# Patient Record
Sex: Female | Born: 1935 | Race: White | Hispanic: No | Smoking: Never smoker
Health system: Southern US, Community
[De-identification: ages and names within clinical notes are randomized; demographics above are authoritative.]

## PROBLEM LIST (undated history)

## (undated) DIAGNOSIS — F39 Unspecified mood [affective] disorder: Secondary | ICD-10-CM

## (undated) DIAGNOSIS — I2510 Atherosclerotic heart disease of native coronary artery without angina pectoris: Secondary | ICD-10-CM

## (undated) DIAGNOSIS — Z9989 Dependence on other enabling machines and devices: Secondary | ICD-10-CM

## (undated) DIAGNOSIS — I214 Non-ST elevation (NSTEMI) myocardial infarction: Secondary | ICD-10-CM

## (undated) DIAGNOSIS — I2699 Other pulmonary embolism without acute cor pulmonale: Secondary | ICD-10-CM

## (undated) DIAGNOSIS — I10 Essential (primary) hypertension: Secondary | ICD-10-CM

## (undated) DIAGNOSIS — I209 Angina pectoris, unspecified: Secondary | ICD-10-CM

## (undated) DIAGNOSIS — G4733 Obstructive sleep apnea (adult) (pediatric): Secondary | ICD-10-CM

## (undated) DIAGNOSIS — R011 Cardiac murmur, unspecified: Secondary | ICD-10-CM

## (undated) HISTORY — PX: BREAST CYST ASPIRATION: SHX578

## (undated) HISTORY — PX: TONSILLECTOMY: SUR1361

## (undated) HISTORY — PX: COLONOSCOPY: SHX174

## (undated) HISTORY — PX: EYE SURGERY: SHX253

## (undated) HISTORY — DX: Unspecified mood (affective) disorder: F39

## (undated) HISTORY — PX: CARDIAC CATHETERIZATION: SHX172

## (undated) HISTORY — PX: CATARACT EXTRACTION, BILATERAL: SHX1313

## (undated) HISTORY — DX: Atherosclerotic heart disease of native coronary artery without angina pectoris: I25.10

## (undated) HISTORY — PX: INGUINAL HERNIA REPAIR: SUR1180

---

## 2004-10-23 ENCOUNTER — Ambulatory Visit: Payer: Self-pay | Admitting: Ophthalmology

## 2004-10-28 ENCOUNTER — Ambulatory Visit: Payer: Self-pay | Admitting: Ophthalmology

## 2004-12-02 ENCOUNTER — Ambulatory Visit: Payer: Self-pay | Admitting: Ophthalmology

## 2005-09-01 ENCOUNTER — Ambulatory Visit: Payer: Self-pay | Admitting: Internal Medicine

## 2005-10-15 ENCOUNTER — Ambulatory Visit: Payer: Self-pay | Admitting: Ophthalmology

## 2005-10-20 ENCOUNTER — Ambulatory Visit: Payer: Self-pay | Admitting: Ophthalmology

## 2007-01-04 ENCOUNTER — Ambulatory Visit: Payer: Self-pay | Admitting: Internal Medicine

## 2007-05-23 ENCOUNTER — Other Ambulatory Visit: Payer: Self-pay

## 2007-05-23 ENCOUNTER — Inpatient Hospital Stay: Payer: Self-pay | Admitting: Internal Medicine

## 2007-10-03 ENCOUNTER — Ambulatory Visit: Payer: Self-pay | Admitting: Internal Medicine

## 2008-05-24 ENCOUNTER — Emergency Department: Payer: Self-pay | Admitting: Emergency Medicine

## 2008-11-12 ENCOUNTER — Ambulatory Visit: Payer: Self-pay | Admitting: Internal Medicine

## 2009-05-27 ENCOUNTER — Ambulatory Visit: Payer: Self-pay | Admitting: Unknown Physician Specialty

## 2010-05-27 ENCOUNTER — Ambulatory Visit: Payer: Self-pay | Admitting: Internal Medicine

## 2010-09-29 ENCOUNTER — Ambulatory Visit: Payer: Self-pay | Admitting: Internal Medicine

## 2012-01-13 ENCOUNTER — Ambulatory Visit: Payer: Self-pay | Admitting: Internal Medicine

## 2015-02-08 ENCOUNTER — Emergency Department
Admission: EM | Admit: 2015-02-08 | Discharge: 2015-02-08 | Disposition: A | Discharge status: Home / Self Care | Payer: PPO | Attending: Emergency Medicine | Admitting: Emergency Medicine

## 2015-02-08 ENCOUNTER — Emergency Department: Payer: PPO

## 2015-02-08 ENCOUNTER — Encounter: Payer: Self-pay | Admitting: Emergency Medicine

## 2015-02-08 DIAGNOSIS — Y9389 Activity, other specified: Secondary | ICD-10-CM | POA: Insufficient documentation

## 2015-02-08 DIAGNOSIS — S0101XA Laceration without foreign body of scalp, initial encounter: Secondary | ICD-10-CM | POA: Diagnosis not present

## 2015-02-08 DIAGNOSIS — I10 Essential (primary) hypertension: Secondary | ICD-10-CM | POA: Insufficient documentation

## 2015-02-08 DIAGNOSIS — Y998 Other external cause status: Secondary | ICD-10-CM | POA: Diagnosis not present

## 2015-02-08 DIAGNOSIS — Y9289 Other specified places as the place of occurrence of the external cause: Secondary | ICD-10-CM | POA: Diagnosis not present

## 2015-02-08 DIAGNOSIS — S0003XA Contusion of scalp, initial encounter: Secondary | ICD-10-CM

## 2015-02-08 DIAGNOSIS — R791 Abnormal coagulation profile: Secondary | ICD-10-CM | POA: Insufficient documentation

## 2015-02-08 DIAGNOSIS — W01190A Fall on same level from slipping, tripping and stumbling with subsequent striking against furniture, initial encounter: Secondary | ICD-10-CM | POA: Insufficient documentation

## 2015-02-08 HISTORY — DX: Essential (primary) hypertension: I10

## 2015-02-08 HISTORY — DX: Other pulmonary embolism without acute cor pulmonale: I26.99

## 2015-02-08 LAB — CBC WITH DIFFERENTIAL/PLATELET
BASOS ABS: 0.1 10*3/uL (ref 0–0.1)
BASOS PCT: 1 %
EOS ABS: 0.1 10*3/uL (ref 0–0.7)
Eosinophils Relative: 1 %
HEMATOCRIT: 35 % (ref 35.0–47.0)
Hemoglobin: 11.1 g/dL — ABNORMAL LOW (ref 12.0–16.0)
LYMPHS ABS: 1.2 10*3/uL (ref 1.0–3.6)
LYMPHS PCT: 15 %
MCH: 25.4 pg — ABNORMAL LOW (ref 26.0–34.0)
MCHC: 31.7 g/dL — ABNORMAL LOW (ref 32.0–36.0)
MCV: 79.9 fL — AB (ref 80.0–100.0)
MONO ABS: 0.6 10*3/uL (ref 0.2–0.9)
MONOS PCT: 7 %
NEUTROS ABS: 6.2 10*3/uL (ref 1.4–6.5)
NEUTROS PCT: 76 %
Platelets: 215 10*3/uL (ref 150–440)
RBC: 4.38 MIL/uL (ref 3.80–5.20)
RDW: 15.8 % — AB (ref 11.5–14.5)
WBC: 8.2 10*3/uL (ref 3.6–11.0)

## 2015-02-08 LAB — BASIC METABOLIC PANEL
ANION GAP: 7 (ref 5–15)
BUN: 20 mg/dL (ref 6–20)
CHLORIDE: 105 mmol/L (ref 101–111)
CO2: 27 mmol/L (ref 22–32)
Calcium: 9 mg/dL (ref 8.9–10.3)
Creatinine, Ser: 0.86 mg/dL (ref 0.44–1.00)
GFR calc Af Amer: >60 mL/min (ref >60)
GFR calc non Af Amer: 59 mL/min — ABNORMAL LOW (ref >60)
GLUCOSE: 141 mg/dL — AB (ref 65–99)
Potassium: 4.1 mmol/L (ref 3.5–5.1)
SODIUM: 139 mmol/L (ref 135–145)

## 2015-02-08 LAB — PROTIME-INR
INR: 4.45
PROTHROMBIN TIME: 40 s — AB (ref 11.4–15.0)

## 2015-02-08 LAB — HEMOGLOBIN AND HEMATOCRIT, BLOOD
HEMATOCRIT: 31 % — AB (ref 35.0–47.0)
Hemoglobin: 9.9 g/dL — ABNORMAL LOW (ref 12.0–16.0)

## 2015-02-08 LAB — TROPONIN I: Troponin I: <0.03 ng/mL (ref <0.031)

## 2015-02-08 MED ORDER — FENTANYL CITRATE (PF) 100 MCG/2ML IJ SOLN: 50.0000 ug | Freq: Once | INTRAMUSCULAR | Status: DC

## 2015-02-08 MED ORDER — FENTANYL CITRATE (PF) 100 MCG/2ML IJ SOLN
INTRAMUSCULAR | Status: AC
  Filled 2015-02-08: qty 2

## 2015-02-08 MED ORDER — SODIUM CHLORIDE 0.9 % IV SOLN
INTRAVENOUS | Status: DC
  Administered 2015-02-08: 13:00:00 via INTRAVENOUS

## 2015-02-08 NOTE — ED Notes (Signed)
D/c instructions reviewed w/ pt - pt denies any further questions or concerns at present.   

## 2015-02-08 NOTE — ED Provider Notes (Addendum)
[facility] Emergency Department Provider Note   ____________________________________________  Time seen: On arrival 8 AM I have reviewed the triage vital signs and the triage nursing note.  HISTORY  Chief Complaint Head Laceration   Historian Patient and her son  HPI Alison Russo is a 81 y.o. female who lives alone and fell this morning around 3:30 in the morning striking her head on the cabinet, this was a fall from standing. She had lost her balance. It has been bleeding since the incident. She was worried about an ambulance bill and so did not call EMS, and she did not wake up or signs that she did not call him until this morning. She has been somewhat dizzy. Otherwise she's been well recently. She does take Coumadin for history of blood clots in the lung. She didn't not into her neck, chest, abdomen, or extremities.Bleeding is moderate.    Past Medical History  Diagnosis Date  . Hypertension   . History of blood clots   . PE (pulmonary embolism)     There are no active problems to display for this patient.   History reviewed. No pertinent past surgical history.  No current outpatient prescriptions on file.  Allergies Review of patient's allergies indicates no known allergies.  No family history on file.  Social History History  Substance Use Topics  . Smoking status: Never Smoker   . Smokeless tobacco: Not on file  . Alcohol Use: No    Review of Systems  Constitutional: Negative for fever. Eyes: Negative for visual changes. ENT: Negative for sore throat. Cardiovascular: Negative for chest pain. Respiratory: Negative for shortness of breath. Gastrointestinal: Negative for abdominal pain, vomiting and diarrhea. Genitourinary: Negative for dysuria. Musculoskeletal: Negative for back pain. Skin: Negative for rash. Neurological: Negative for headaches, focal weakness or  numbness.  ____________________________________________   PHYSICAL EXAM:  VITAL SIGNS: ED Triage Vitals  Enc Vitals Group     BP 02/08/15 0749 130/99 mmHg     Pulse Rate 02/08/15 0749 109     Resp --      Temp 02/08/15 0749 97.5 F (36.4 C)     Temp Source 02/08/15 0749 Oral     SpO2 02/08/15 0749 99 %     Weight 02/08/15 0749 190 lb (86.183 kg)     Height 02/08/15 0749  (1.676 m)     Head Cir --      Peak Flow --      Pain Score 02/08/15 0749 4     Pain Loc --      Pain Edu? --      Excl. in GC? --      Constitutional: Alert and oriented. Well appearing and in no distress. Eyes: Conjunctivae are normal. PERRL. Normal extraocular movements. ENT   Head: Normocephalic and atraumatic.   Nose: No congestion/rhinnorhea.   Mouth/Throat: Mucous membranes are moist.   Neck: No midline C-spine tenderness. Cardiovascular: Normal rate, regular rhythm.  No murmurs, rubs, or gallops. Respiratory: Normal respiratory effort without tachypnea nor retractions. Breath sounds are clear and equal bilaterally. No wheezes/rales/rhonchi. Gastrointestinal: Soft and nontender. No distention.  Genitourinary: Musculoskeletal: Nontender with normal range of motion in all extremities. No joint effusions.  No lower extremity tenderness nor edema. Neurologic:  Normal speech and language. No gross focal neurologic deficits are appreciated. Skin:  Skin is warm, dry and intact. No rash noted. Psychiatric: Mood and affect are normal. Speech and behavior are normal. Patient exhibits appropriate insight and  judgment.  ____________________________________________   EKG  106 bpm. Atrial flutter with variable AV block. Narrow QRS. Normal axis. Nonspecific T wave. ____________________________________________  LABS (pertinent positives/negatives)  White blood cell count 8.2, hemoglobin 11.1, but was 2:15 INR elevated at 4.45 Elect lites within normal  limits  ____________________________________________  RADIOLOGY Radiologist results reviewed  CT head without contrast: Posterior right scalp laceration with no other acute findings. __________________________________________  PROCEDURES  Procedure(s) performed: LACERATION REPAIR Performed by: Governor RooksLORD, Gianlucas Evenson Authorized by: Governor RooksLORD, Jaden Batchelder Consent: Verbal consent obtained. Risks and benefits: risks, benefits and alternatives were discussed Consent given by: patient Patient identity confirmed: provided demographic data Wound explored  Laceration Location: Posterior scalp  Laceration Length: 0.5cm  No Foreign Bodies seen or palpated, no tear through the periosteal layers   Irrigation method: syringe Amount of cleaning: standard  Skin closure: Sutures 6-0 Prolene   Number of sutures: 2   Technique: Interrupted   Patient tolerance: Patient tolerated the procedure well with no immediate complications.  Critical Care performed: No  ____________________________________________   ED COURSE / ASSESSMENT AND PLAN  Pertinent labs & imaging results that were available during my care of the patient were reviewed by me and considered in my medical decision making (see chart for details).   I stitched the scalp laceration and placed a Ace wrap around the head to provide some amount of pressure control and this did create hemostasis. CT the head showed no skull injury or intracranial bleeding. Given the fact that her bleeding is now controlled, I will have her omit 2 doses of her Coumadin, rather than any vitamin K at this point in time given the fact of overshooting is risky because she is on blood thinner due to pulmonary exam was him. Patient and her son and daughter are agreeable and comfortable with this plan of management. A family member will stay with her tonight. Her hemoglobin was 11.1, and although it may be slightly less than this in the setting of acute bleeding, I do not  think that she needs recheck given that she is no longer having any continued bleeding and she is asymptomatic.  I did discuss her EKG shows atrial flutter. She is not aware that she's had an irregular heartbeat in the past. She is already on a blood thinner. She denies any shortness of breath or chest pain. I asked her to follow up with her primary doctor on Monday for repeat EKG.  Patient / Family / Caregiver informed of clinical course, understand medical decision-making process, and agree with plan.  Upon patient ready for discharge she did stand up and get hypotensive and still nauseated and sweaty blood pressure went to 70/40. She was laid back in the bed and given 1 L of normal saline, and then a repeat hemoglobin was drawn. While waiting she was given a second liter of normal saline. Her repeat orthostatics were negative for any hypotension. Patient says she overall feels weak but then she hasn't had anything all day. Her repeat hemoglobin was 9.9. I discussed this reassuring finding with the patient and her family. She will be okay to discharge home now. ___________________________________________   FINAL CLINICAL IMPRESSION(S) / ED DIAGNOSES   Final diagnoses:  Supratherapeutic INR  Scalp laceration, initial encounter  Scalp contusion, initial encounter      Governor Rooksebecca Lunden Mcleish, MD 02/08/15 1138  Governor Rooksebecca Dannie Hattabaugh, MD 02/08/15 1205  Governor Rooksebecca Caren Garske, MD 02/08/15 1553

## 2015-02-08 NOTE — ED Notes (Signed)
Pt has discharged orders and notes in place.. Upon reassesMarland Kitchensment, BP low, pt pale sitting up on end of stretcher.  RN reassessed BP in same arm again and in other arm and they were consistently low.  Pt laid back, c/o feeling nauseated, pale, skin cool and damp.  Family at bedside.  IVF  Initiated through saline lock.  DR Shaune PollackLord notified

## 2015-02-08 NOTE — ED Notes (Signed)
Pt reports that she started to take off her jeans and lost her balance, she hit her head on the cabinet. She does take coumadin. She did this 0330. It stopped bleeding then started again. She became dizzy and light headed.

## 2015-02-08 NOTE — ED Notes (Signed)
Patient transported to CT 

## 2015-02-08 NOTE — Discharge Instructions (Signed)
2 stitches were placed in her scalp and these need to be removed in about 10 days, your primary care doctor's office can do this. Leave your Ace wrap in place for 1-2 days as needed for any additional oozing. Keep scalp clean and dry for 2 days. We discussed your INR is 4.45 today with a goal being 2 to 3 for prevention of blood clots by blood thinning. Skipped your warfarin dose tonight, Saturday, and tomorrow, Sunday. On Monday morning you need to see your regular doctor to have your INR rechecked. I would suspect you would restart your warfarin on Monday. Return to the emergency department for any bleeding that will not stop with pressure, any dizziness, any lightheadedness, any passing out, any chest pain, any shortness of breath or trouble breathing, any weakness or numbness.  Don't be afraid to call 911 or at least her family member if you strike her head or have any bleeding that does not stop within 10 minutes of pressure.  :)   Facial or Scalp Contusion A facial or scalp contusion is a deep bruise on the face or head. Injuries to the face and head generally cause a lot of swelling, especially around the eyes. Contusions are the result of an injury that caused bleeding under the skin. The contusion may turn blue, purple, or yellow. Minor injuries will give you a painless contusion, but more severe contusions may stay painful and swollen for a few weeks.  CAUSES  A facial or scalp contusion is caused by a blunt injury or trauma to the face or head area.  SIGNS AND SYMPTOMS   Swelling of the injured area.   Discoloration of the injured area.   Tenderness, soreness, or pain in the injured area.  DIAGNOSIS  The diagnosis can be made by taking a medical history and doing a physical exam. An X-ray exam, CT scan, or MRI may be needed to determine if there are any associated injuries, such as broken bones (fractures). TREATMENT  Often, the best treatment for a facial or scalp contusion is  applying cold compresses to the injured area. Over-the-counter medicines may also be recommended for pain control.  HOME CARE INSTRUCTIONS   Only take over-the-counter or prescription medicines as directed by your health care provider.   Apply ice to the injured area.   Put ice in a plastic bag.   Place a towel between your skin and the bag.   Leave the ice on for 20 minutes, 2-3 times a day.  SEEK MEDICAL CARE IF:  You have bite problems.   You have pain with chewing.   You are concerned about facial defects. SEEK IMMEDIATE MEDICAL CARE IF:  You have severe pain or a headache that is not relieved by medicine.   You have unusual sleepiness, confusion, or personality changes.   You throw up (vomit).   You have a persistent nosebleed.   You have double vision or blurred vision.   You have fluid drainage from your nose or ear.   You have difficulty walking or using your arms or legs.  MAKE SURE YOU:   Understand these instructions.  Will watch your condition.  Will get help right away if you are not doing well or get worse. Document Released: 10/14/2004 Document Revised: 06/27/2013 Document Reviewed: 04/19/2013 [facility] Patient Information 2015 [place], [place]. This information is not intended to replace advice given to you by your health care provider. Make sure you discuss any questions you have with your health  care provider.  Laceration Care, Adult A laceration is a cut or lesion that goes through all layers of the skin and into the tissue just beneath the skin. TREATMENT  Some lacerations may not require closure. Some lacerations may not be able to be closed due to an increased risk of infection. It is important to see your caregiver as soon as possible after an injury to minimize the risk of infection and maximize the opportunity for successful closure. If closure is appropriate, pain medicines may be given, if needed. The wound will be cleaned to help  prevent infection. Your caregiver will use stitches (sutures), staples, wound glue (adhesive), or skin adhesive strips to repair the laceration. These tools bring the skin edges together to allow for faster healing and a better cosmetic outcome. However, all wounds will heal with a scar. Once the wound has healed, scarring can be minimized by covering the wound with sunscreen during the day for 1 full year. HOME CARE INSTRUCTIONS  For sutures or staples:  Keep the wound clean and dry.  If you were given a bandage (dressing), you should change it at least once a day. Also, change the dressing if it becomes wet or dirty, or as directed by your caregiver.  Wash the wound with soap and water 2 times a day. Rinse the wound off with water to remove all soap. Pat the wound dry with a clean towel.  After cleaning, apply a thin layer of the antibiotic ointment as recommended by your caregiver. This will help prevent infection and keep the dressing from sticking.  You may shower as usual after the first 24 hours. Do not soak the wound in water until the sutures are removed.  Only take over-the-counter or prescription medicines for pain, discomfort, or fever as directed by your caregiver.  Get your sutures or staples removed as directed by your caregiver. For skin adhesive strips:  Keep the wound clean and dry.  Do not get the skin adhesive strips wet. You may bathe carefully, using caution to keep the wound dry.  If the wound gets wet, pat it dry with a clean towel.  Skin adhesive strips will fall off on their own. You may trim the strips as the wound heals. Do not remove skin adhesive strips that are still stuck to the wound. They will fall off in time. For wound adhesive:  You may briefly wet your wound in the shower or bath. Do not soak or scrub the wound. Do not swim. Avoid periods of heavy perspiration until the skin adhesive has fallen off on its own. After showering or bathing, gently pat  the wound dry with a clean towel.  Do not apply liquid medicine, cream medicine, or ointment medicine to your wound while the skin adhesive is in place. This may loosen the film before your wound is healed.  If a dressing is placed over the wound, be careful not to apply tape directly over the skin adhesive. This may cause the adhesive to be pulled off before the wound is healed.  Avoid prolonged exposure to sunlight or tanning lamps while the skin adhesive is in place. Exposure to ultraviolet light in the first year will darken the scar.  The skin adhesive will usually remain in place for 5 to 10 days, then naturally fall off the skin. Do not pick at the adhesive film. You may need a tetanus shot if:  You cannot remember when you had your last tetanus shot.  You have  never had a tetanus shot. If you get a tetanus shot, your arm may swell, get red, and feel warm to the touch. This is common and not a problem. If you need a tetanus shot and you choose not to have one, there is a rare chance of getting tetanus. Sickness from tetanus can be serious. SEEK MEDICAL CARE IF:   You have redness, swelling, or increasing pain in the wound.  You see a red line that goes away from the wound.  You have yellowish-white fluid (pus) coming from the wound.  You have a fever.  You notice a bad smell coming from the wound or dressing.  Your wound breaks open before or after sutures have been removed.  You notice something coming out of the wound such as wood or glass.  Your wound is on your hand or foot and you cannot move a finger or toe. SEEK IMMEDIATE MEDICAL CARE IF:   Your pain is not controlled with prescribed medicine.  You have severe swelling around the wound causing pain and numbness or a change in color in your arm, hand, leg, or foot.  Your wound splits open and starts bleeding.  You have worsening numbness, weakness, or loss of function of any joint around or beyond the  wound.  You develop painful lumps near the wound or on the skin anywhere on your body. MAKE SURE YOU:   Understand these instructions.  Will watch your condition.  Will get help right away if you are not doing well or get worse. Document Released: 09/06/2005 Document Revised: 11/29/2011 Document Reviewed: 03/02/2011 [facility] Patient Information 2015 [place]. This information is not intended to replace advice given to you by your health care provider. Make sure you discuss any questions you have with your health care provider.

## 2015-02-08 NOTE — ED Notes (Signed)
Ambulated to commode in room for BM, pt tolerated well.  Still c/o not feeling well. Denies any pain, C/O SOB after exertion and just feeling shaky

## 2015-02-08 NOTE — ED Notes (Signed)
MD at bedside suturing and dressing patient's wound. Patient alert and oriented. No obvious distress at this time.

## 2015-02-08 NOTE — ED Notes (Signed)
Notified ED MD regarding critical INR

## 2015-03-20 ENCOUNTER — Encounter: Admission: RE | Payer: Self-pay | Source: Ambulatory Visit

## 2015-03-20 ENCOUNTER — Ambulatory Visit: Admission: RE | Admit: 2015-03-20 | Payer: PPO | Source: Ambulatory Visit | Admitting: Internal Medicine

## 2015-03-20 SURGERY — CARDIOVERSION (CATH LAB)
Anesthesia: General

## 2015-07-01 ENCOUNTER — Other Ambulatory Visit: Payer: Self-pay | Admitting: Internal Medicine

## 2015-07-01 DIAGNOSIS — Z1231 Encounter for screening mammogram for malignant neoplasm of breast: Secondary | ICD-10-CM

## 2015-07-11 ENCOUNTER — Ambulatory Visit
Admission: RE | Admit: 2015-07-11 | Discharge: 2015-07-11 | Disposition: A | Discharge status: Home / Self Care | Payer: PPO | Source: Ambulatory Visit | Attending: Internal Medicine | Admitting: Internal Medicine

## 2015-07-11 DIAGNOSIS — Z1231 Encounter for screening mammogram for malignant neoplasm of breast: Secondary | ICD-10-CM | POA: Insufficient documentation

## 2015-10-07 DIAGNOSIS — I10 Essential (primary) hypertension: Secondary | ICD-10-CM | POA: Diagnosis not present

## 2015-10-07 DIAGNOSIS — F329 Major depressive disorder, single episode, unspecified: Secondary | ICD-10-CM | POA: Diagnosis not present

## 2015-10-07 DIAGNOSIS — Z7901 Long term (current) use of anticoagulants: Secondary | ICD-10-CM | POA: Diagnosis not present

## 2015-10-07 DIAGNOSIS — Z86718 Personal history of other venous thrombosis and embolism: Secondary | ICD-10-CM | POA: Diagnosis not present

## 2015-10-07 DIAGNOSIS — G4730 Sleep apnea, unspecified: Secondary | ICD-10-CM | POA: Diagnosis not present

## 2015-10-07 DIAGNOSIS — I481 Persistent atrial fibrillation: Secondary | ICD-10-CM | POA: Diagnosis not present

## 2015-10-21 DIAGNOSIS — Z7901 Long term (current) use of anticoagulants: Secondary | ICD-10-CM | POA: Diagnosis not present

## 2015-11-17 DIAGNOSIS — Z7901 Long term (current) use of anticoagulants: Secondary | ICD-10-CM | POA: Diagnosis not present

## 2015-12-18 DIAGNOSIS — Z7901 Long term (current) use of anticoagulants: Secondary | ICD-10-CM | POA: Diagnosis not present

## 2016-01-16 DIAGNOSIS — Z7901 Long term (current) use of anticoagulants: Secondary | ICD-10-CM | POA: Diagnosis not present

## 2016-02-17 DIAGNOSIS — Z7901 Long term (current) use of anticoagulants: Secondary | ICD-10-CM | POA: Diagnosis not present

## 2016-02-27 DIAGNOSIS — M6751 Plica syndrome, right knee: Secondary | ICD-10-CM | POA: Diagnosis not present

## 2016-02-27 DIAGNOSIS — S8001XA Contusion of right knee, initial encounter: Secondary | ICD-10-CM | POA: Diagnosis not present

## 2016-02-27 DIAGNOSIS — M25561 Pain in right knee: Secondary | ICD-10-CM | POA: Diagnosis not present

## 2016-03-01 DIAGNOSIS — Z7689 Persons encountering health services in other specified circumstances: Secondary | ICD-10-CM | POA: Diagnosis not present

## 2016-03-05 DIAGNOSIS — H353132 Nonexudative age-related macular degeneration, bilateral, intermediate dry stage: Secondary | ICD-10-CM | POA: Diagnosis not present

## 2016-04-06 DIAGNOSIS — Z7901 Long term (current) use of anticoagulants: Secondary | ICD-10-CM | POA: Diagnosis not present

## 2016-04-06 DIAGNOSIS — I8310 Varicose veins of unspecified lower extremity with inflammation: Secondary | ICD-10-CM | POA: Diagnosis not present

## 2016-04-06 DIAGNOSIS — I481 Persistent atrial fibrillation: Secondary | ICD-10-CM | POA: Diagnosis not present

## 2016-04-06 DIAGNOSIS — I10 Essential (primary) hypertension: Secondary | ICD-10-CM | POA: Diagnosis not present

## 2016-05-07 ENCOUNTER — Other Ambulatory Visit: Payer: Self-pay | Admitting: Physician Assistant

## 2016-05-07 DIAGNOSIS — M25552 Pain in left hip: Secondary | ICD-10-CM

## 2016-05-10 ENCOUNTER — Other Ambulatory Visit: Payer: Self-pay | Admitting: Physician Assistant

## 2016-05-10 ENCOUNTER — Encounter (INDEPENDENT_AMBULATORY_CARE_PROVIDER_SITE_OTHER): Payer: Self-pay

## 2016-05-10 ENCOUNTER — Ambulatory Visit
Admission: RE | Admit: 2016-05-10 | Discharge: 2016-05-10 | Disposition: A | Discharge status: Home / Self Care | Payer: PPO | Source: Ambulatory Visit | Attending: Physician Assistant | Admitting: Physician Assistant

## 2016-05-10 DIAGNOSIS — M25552 Pain in left hip: Secondary | ICD-10-CM | POA: Diagnosis not present

## 2016-05-10 DIAGNOSIS — Z7689 Persons encountering health services in other specified circumstances: Secondary | ICD-10-CM | POA: Diagnosis not present

## 2016-05-10 DIAGNOSIS — Z7901 Long term (current) use of anticoagulants: Secondary | ICD-10-CM | POA: Diagnosis not present

## 2016-05-11 DIAGNOSIS — M25552 Pain in left hip: Secondary | ICD-10-CM | POA: Diagnosis not present

## 2016-05-11 DIAGNOSIS — Z7901 Long term (current) use of anticoagulants: Secondary | ICD-10-CM | POA: Diagnosis not present

## 2016-05-11 DIAGNOSIS — I481 Persistent atrial fibrillation: Secondary | ICD-10-CM | POA: Diagnosis not present

## 2016-05-11 DIAGNOSIS — F329 Major depressive disorder, single episode, unspecified: Secondary | ICD-10-CM | POA: Diagnosis not present

## 2016-05-13 DIAGNOSIS — M7062 Trochanteric bursitis, left hip: Secondary | ICD-10-CM | POA: Diagnosis not present

## 2016-05-28 DIAGNOSIS — Z7689 Persons encountering health services in other specified circumstances: Secondary | ICD-10-CM | POA: Diagnosis not present

## 2016-07-20 DIAGNOSIS — F411 Generalized anxiety disorder: Secondary | ICD-10-CM | POA: Diagnosis not present

## 2016-07-20 DIAGNOSIS — I2609 Other pulmonary embolism with acute cor pulmonale: Secondary | ICD-10-CM | POA: Diagnosis not present

## 2016-07-20 DIAGNOSIS — I481 Persistent atrial fibrillation: Secondary | ICD-10-CM | POA: Diagnosis not present

## 2016-07-20 DIAGNOSIS — F329 Major depressive disorder, single episode, unspecified: Secondary | ICD-10-CM | POA: Diagnosis not present

## 2016-07-20 DIAGNOSIS — Z0001 Encounter for general adult medical examination with abnormal findings: Secondary | ICD-10-CM | POA: Diagnosis not present

## 2016-07-20 DIAGNOSIS — Z7901 Long term (current) use of anticoagulants: Secondary | ICD-10-CM | POA: Diagnosis not present

## 2016-07-20 DIAGNOSIS — I10 Essential (primary) hypertension: Secondary | ICD-10-CM | POA: Diagnosis not present

## 2016-07-22 DIAGNOSIS — I480 Paroxysmal atrial fibrillation: Secondary | ICD-10-CM | POA: Diagnosis not present

## 2016-07-22 DIAGNOSIS — D689 Coagulation defect, unspecified: Secondary | ICD-10-CM | POA: Diagnosis not present

## 2016-07-22 DIAGNOSIS — I208 Other forms of angina pectoris: Secondary | ICD-10-CM | POA: Diagnosis not present

## 2016-07-22 DIAGNOSIS — R0602 Shortness of breath: Secondary | ICD-10-CM | POA: Diagnosis not present

## 2016-07-22 DIAGNOSIS — R5383 Other fatigue: Secondary | ICD-10-CM | POA: Diagnosis not present

## 2016-07-22 DIAGNOSIS — I10 Essential (primary) hypertension: Secondary | ICD-10-CM | POA: Diagnosis not present

## 2016-07-22 DIAGNOSIS — R000 Tachycardia, unspecified: Secondary | ICD-10-CM | POA: Diagnosis not present

## 2016-07-22 DIAGNOSIS — E669 Obesity, unspecified: Secondary | ICD-10-CM | POA: Diagnosis not present

## 2016-10-01 DIAGNOSIS — H26491 Other secondary cataract, right eye: Secondary | ICD-10-CM | POA: Diagnosis not present

## 2016-10-13 DIAGNOSIS — R1030 Lower abdominal pain, unspecified: Secondary | ICD-10-CM | POA: Diagnosis not present

## 2016-10-13 DIAGNOSIS — R319 Hematuria, unspecified: Secondary | ICD-10-CM | POA: Diagnosis not present

## 2016-10-16 ENCOUNTER — Emergency Department
Admission: EM | Admit: 2016-10-16 | Discharge: 2016-10-16 | Disposition: A | Discharge status: Home / Self Care | Payer: PPO | Attending: Emergency Medicine | Admitting: Emergency Medicine

## 2016-10-16 ENCOUNTER — Encounter: Payer: Self-pay | Admitting: Emergency Medicine

## 2016-10-16 ENCOUNTER — Emergency Department: Payer: PPO

## 2016-10-16 DIAGNOSIS — I10 Essential (primary) hypertension: Secondary | ICD-10-CM | POA: Diagnosis not present

## 2016-10-16 DIAGNOSIS — N939 Abnormal uterine and vaginal bleeding, unspecified: Secondary | ICD-10-CM | POA: Diagnosis not present

## 2016-10-16 LAB — URINALYSIS, COMPLETE (UACMP) WITH MICROSCOPIC
Bacteria, UA: NONE SEEN
Specific Gravity, Urine: 1.023 (ref 1.005–1.030)
Squamous Epithelial / LPF: NONE SEEN

## 2016-10-16 LAB — CBC
HEMATOCRIT: 37.3 % (ref 35.0–47.0)
HEMOGLOBIN: 12.3 g/dL (ref 12.0–16.0)
MCH: 28.5 pg (ref 26.0–34.0)
MCHC: 33 g/dL (ref 32.0–36.0)
MCV: 86.4 fL (ref 80.0–100.0)
Platelets: 206 10*3/uL (ref 150–440)
RBC: 4.31 MIL/uL (ref 3.80–5.20)
RDW: 13.5 % (ref 11.5–14.5)
WBC: 8.6 10*3/uL (ref 3.6–11.0)

## 2016-10-16 LAB — BASIC METABOLIC PANEL
Anion gap: 8 (ref 5–15)
BUN: 21 mg/dL — ABNORMAL HIGH (ref 6–20)
CALCIUM: 9 mg/dL (ref 8.9–10.3)
CO2: 25 mmol/L (ref 22–32)
Chloride: 106 mmol/L (ref 101–111)
Creatinine, Ser: 1 mg/dL (ref 0.44–1.00)
GFR calc non Af Amer: 49 mL/min — ABNORMAL LOW (ref >60)
GFR, EST AFRICAN AMERICAN: 57 mL/min — AB (ref >60)
Glucose, Bld: 100 mg/dL — ABNORMAL HIGH (ref 65–99)
POTASSIUM: 3.8 mmol/L (ref 3.5–5.1)
Sodium: 139 mmol/L (ref 135–145)

## 2016-10-16 LAB — PROTIME-INR
INR: 3.08
Prothrombin Time: 32.5 seconds — ABNORMAL HIGH (ref 11.4–15.2)

## 2016-10-16 NOTE — ED Provider Notes (Signed)
[facility] Emergency Department Provider Note  Time seen: 1:55 PM  I have reviewed the triage vital signs and the nursing notes.   HISTORY  Chief Complaint Vaginal Bleeding    HPI Alison Russo is a 83 y.o. female with a past medical history of DVT/PE on Coumadin, hypertension, presents to the emergency department for vaginal bleeding. According to the patient with a past 5 days she has noticed intermittent rational bleeding. Patient has never had vaginal bleeding becoming menopausal. Denies any sexual activity. Denies any abdominal pain or pelvic pain. Denies any trauma.  Past Medical History:  Diagnosis Date  . History of blood clots   . Hypertension   . PE (pulmonary embolism)     There are no active problems to display for this patient.   Past Surgical History:  Procedure Laterality Date  . BREAST CYST ASPIRATION Right    neg    Prior to Admission medications   Not on File    No Known Allergies  Family History  Problem Relation Age of Onset  . Breast cancer Mother [id number]    Social History Social History  Substance Use Topics  . Smoking status: Never Smoker  . Smokeless tobacco: Never Used  . Alcohol use No    Review of Systems Constitutional: Negative for fever. Cardiovascular: Negative for chest pain. Respiratory: Negative for shortness of breath. Gastrointestinal: Negative for abdominal pain 10-point ROS otherwise negative.  ____________________________________________   PHYSICAL EXAM:  VITAL SIGNS: ED Triage Vitals [10/16/16 1125]  Enc Vitals Group     BP 139/68     Pulse Rate 73     Resp 16     Temp 98.1 F (36.7 C)     Temp Source Oral     SpO2 95 %     Weight 200 lb (90.7 kg)     Height 5\' 6"  (1.676 m)     Head Circumference      Peak Flow      Pain Score      Pain Loc      Pain Edu?      Excl. in GC?     Constitutional: Alert and oriented. Well appearing and in no distress. Eyes: Normal  exam ENT   Head: Normocephalic and atraumatic   Mouth/Throat: Mucous membranes are moist. Cardiovascular: Normal rate, regular rhythm.  Respiratory: Normal respiratory effort without tachypnea nor retractions. Breath sounds are clear Gastrointestinal: Soft and nontender. No distention.  Genitourinary: No abnormalities noted on pelvic examination, mild amount of vaginal discharge, no blood currently from the cervix although there is a small amount of blood in the vaginal canal. No clot. Patient does have fairly large hemorrhoids, none of which appear to be bleeding, patient states is a been present for many years. Musculoskeletal: Nontender with normal range of motion in all extremities.  Neurologic:  Normal speech and language. No gross focal neurologic deficits  Skin:  Skin is warm, dry and intact.  Psychiatric: Mood and affect are normal.   ____________________________________________   RADIOLOGY  Ultrasound shows a thickened endometrium  ____________________________________________   INITIAL IMPRESSION / ASSESSMENT AND PLAN / ED COURSE  Pertinent labs & imaging results that were available during my care of the patient were reviewed by me and considered in my medical decision making (see chart for details).  Patient presents emergency Department with 5 days of intermittent vaginal bleeding. Pelvic examination shows no concerning findings as a very small amount of blood-tinged mucus within the vaginal  canal. No blood from the cervical os at this time. No signs of cervical mass. Patient's ultrasound shows a thickened endometrium concerning for possible carcinoma. Patient's labs are largely within normal limits. Patient is on Coumadin for prior PE her INR is 3.08 today. Given the thickened endometrium and discussed with the patient the need for OB/GYN follow-up. We will call unassigned OB/GYN to arrange a follow-up appointment for the patient for biopsy. Patient is agreeable to plan.  She is in no discomfort, denies any pain.  I discussed the patient with Dr. Dalbert Garnet. She will see the patient in the office on Monday for biopsy.  ____________________________________________   FINAL CLINICAL IMPRESSION(S) / ED DIAGNOSES  Vaginal bleeding    Minna Antis, MD 10/16/16 1427

## 2016-10-16 NOTE — Discharge Instructions (Signed)
Please call the number provided for Dr. Dalbert GarnetBeasley Monday morning at 8:30 AM. She is planning on having you come to the office for a biopsy on Monday. Please take 600 mg of ibuprofen before your appointment. Return to the emergency department for any significant increase in bleeding, or any other symptom personally concerning to yourself.

## 2016-10-16 NOTE — ED Triage Notes (Signed)
Pt to ED from home c/o vaginal bleeding since Monday.  States is on coumadin, had blood work taken Monday and coumadin level was 4.7.  States bleeding fills pad during the night and notices blood when urinating.  Pt denies pain, denies random bruises.

## 2016-10-18 DIAGNOSIS — N950 Postmenopausal bleeding: Secondary | ICD-10-CM | POA: Diagnosis not present

## 2016-10-21 ENCOUNTER — Encounter
Admission: RE | Admit: 2016-10-21 | Discharge: 2016-10-21 | Disposition: A | Discharge status: Home / Self Care | Payer: PPO | Source: Ambulatory Visit | Attending: Obstetrics and Gynecology | Admitting: Obstetrics and Gynecology

## 2016-10-21 DIAGNOSIS — Z01818 Encounter for other preprocedural examination: Secondary | ICD-10-CM | POA: Insufficient documentation

## 2016-10-21 DIAGNOSIS — I519 Heart disease, unspecified: Secondary | ICD-10-CM | POA: Insufficient documentation

## 2016-10-21 DIAGNOSIS — I10 Essential (primary) hypertension: Secondary | ICD-10-CM | POA: Diagnosis not present

## 2016-10-21 HISTORY — DX: Angina pectoris, unspecified: I20.9

## 2016-10-21 LAB — BASIC METABOLIC PANEL
Anion gap: 5 (ref 5–15)
BUN: 20 mg/dL (ref 6–20)
CALCIUM: 9.2 mg/dL (ref 8.9–10.3)
CO2: 27 mmol/L (ref 22–32)
CREATININE: 0.67 mg/dL (ref 0.44–1.00)
Chloride: 109 mmol/L (ref 101–111)
GFR calc Af Amer: >60 mL/min (ref >60)
GFR calc non Af Amer: >60 mL/min (ref >60)
Glucose, Bld: 97 mg/dL (ref 65–99)
Potassium: 3.8 mmol/L (ref 3.5–5.1)
SODIUM: 141 mmol/L (ref 135–145)

## 2016-10-21 LAB — TYPE AND SCREEN
ABO/RH(D): A POS
ANTIBODY SCREEN: NEGATIVE

## 2016-10-21 LAB — APTT: aPTT: 39 seconds — ABNORMAL HIGH (ref 24–36)

## 2016-10-21 LAB — CBC
HCT: 35.9 % (ref 35.0–47.0)
Hemoglobin: 12.2 g/dL (ref 12.0–16.0)
MCH: 29.2 pg (ref 26.0–34.0)
MCHC: 34 g/dL (ref 32.0–36.0)
MCV: 85.9 fL (ref 80.0–100.0)
PLATELETS: 208 10*3/uL (ref 150–440)
RBC: 4.18 MIL/uL (ref 3.80–5.20)
RDW: 13.6 % (ref 11.5–14.5)
WBC: 7.3 10*3/uL (ref 3.6–11.0)

## 2016-10-21 LAB — PROTIME-INR
INR: 2.65
PROTHROMBIN TIME: 28.8 s — AB (ref 11.4–15.2)

## 2016-10-21 NOTE — H&P (Signed)
Alison Russo is a 83 y.o. female here for Vaginal Bleeding . 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  She has not had any vaginal bleeding before.  She is not sexually active.  She denies any abdominal pain, pelvic pain, vaginal pain, vaginal trauma.  No new bladder or bowel symptoms.  Labs in the ER found her INR to be 3.08 on 10/16/16.  The prior week her INR was supratherapeutic at 4.  She has a history of mild anemia with hemoglobin of 9.9 in 2016.  Yesterday in the ER, her hemoglobin was 12.3.   Transvaginal ultrasound on 10/16/16:   FINDINGS: Uterus  Measurements: 5.6 x 2.7 x 5.0 cm. There is a calcified left anterior fundal/ upper uterine segment mass consistent with a calcified fibroid, measuring approximate 2.5 x 3.7 x 2.2 cm. Uterus is diffusely heterogeneous in echogenicity.  Endometrium  Thickness: 15.6 mm. Heterogeneous. Color Doppler flow noted in the endometrium. No endometrial canal fluid.  Right ovary  Not visualized.No adnexal masses.  Left ovary  Not visualized.No adnexal masses.  Other findings  No abnormal free fluid.  IMPRESSION: 1. Thickened endometrium to 15.6 mm. Endometrium is also heterogeneous. In the setting of post-menopausal bleeding, endometrial sampling is indicated to exclude carcinoma. If results are benign, sonohysterogram should be considered for focal lesion work-up. (Ref: Radiological Reasoning: Algorithmic Workup of Abnormal Vaginal Bleeding with Endovaginal Sonography and Sonohysterography. AJR 2008; 409:W11-91) 2. Postmenopausal small uterus with heterogeneous echogenicity. Discrete peripherally calcified fibroid measuring 3.7 cm from the left anterior upper uterine segment/fundus. No other discrete uterine masses. 3. Neither ovary visualized.No adnexal masses.   Past  Medical History:  has a past medical history of Angina pectoris (CMS-HCC); Atrial fibrillation , unspecified (CMS-HCC); Benign neoplasm of colon, unspecified; Depression, unspecified; Heart murmur, unspecified; Hypertension; Iron deficiency anemia; Obesity, unspecified; Pulmonary emboli (CMS-HCC) (05/2007); Sleep apnea; and Tachycardia, unspecified.  Past Surgical History:  has a past surgical history that includes colonoscopy (05/27/2009); egd (05/27/2009); and Benign breast lesion removed (40+ years ago). Family History: family history includes Heart disease in her brother; Lung cancer in her brother. Social History:  reports that she has never smoked. She has never used smokeless tobacco. She reports that she does not drink alcohol or use drugs. OB/GYN History:     OB History    No data available      Allergies: has No Known Allergies. Medications:  Current Outpatient Prescriptions:  .  cholecalciferol (VITAMIN D3) 2,000 unit tablet, Take 2,000 Units by mouth once daily., Disp: , Rfl:  .  cyanocobalamin, vitamin B-12, (VITAMIN B-12) 2,500 mcg Subl, Place under the tongue., Disp: , Rfl:  .  ferrous sulfate 325 (65 FE) MG tablet, Take 325 mg by mouth daily with breakfast., Disp: , Rfl:  .  FUROsemide (LASIX) 20 MG tablet, Take 20 mg by mouth once daily., Disp: , Rfl:  .  lisinopril (PRINIVIL,ZESTRIL) 5 MG tablet, Take 5 mg by mouth once daily., Disp: , Rfl:  .  loratadine (CLARITIN) 10 mg capsule, Take 10 mg by mouth once daily., Disp: , Rfl:  .  metoprolol tartrate (LOPRESSOR) 25 MG tablet, Take 25 mg by mouth 2 (two) times daily., Disp: , Rfl:  .  omega 3-dha-epa-fish oil (OMEGA 3 FISH OIL) 900-1,400 mg CpDR, Take by mouth., Disp: , Rfl:  .  sertraline (ZOLOFT) 100 MG tablet, Take 100 mg by mouth  once daily., Disp: , Rfl:  .  VIT C/E/ZN/COPPR/LUTEIN/ZEAXAN (PRESERVISION AREDS 2 ORAL), Take by mouth., Disp: , Rfl:  .  warfarin (COUMADIN) 5 MG tablet, Take 5 mg by mouth once daily. 5MG   MON,WED,FRI. 2.5 SUN,TUE,THUR,SAT., Disp: , Rfl:    Review of Systems: General:                      No fatigue or weight loss Eyes:                           No vision changes Ears:                            No hearing difficulty Respiratory:                No cough or shortness of breath Pulmonary:                  No asthma or shortness of breath Cardiovascular:           No chest pain, palpitations, dyspnea on exertion Gastrointestinal:          No abdominal bloating, chronic diarrhea, constipations, masses, pain or hematochezia Genitourinary:             See HPI Lymphatic:                   No swollen lymph nodes Musculoskeletal:         No muscle weakness Neurologic:                  No extremity weakness, syncope, seizure disorder Psychiatric:                  No history of depression, delusions or suicidal/homicidal ideation    Exam:      Vitals:   10/18/16 1203  BP: (!) 146/90   Body mass index is 33.25 kg/m.  WDWN white female in NAD Breast: deferred Neck:  no thyromegaly Abdomen: soft , no mass, non-tender, no rebound tenderness Skin: No rashes, ulcers or skin lesions noted. No excessive hirsutism or acne noted.  Neurological: Appears alert and oriented and is a good historian. No gross abnormalities are noted. Psychological: Normal affect and mood. No signs of anxiety or depression noted.   Pelvic:              External genitalia: vulva /labia no lesions, Tanner stage 5             Urethra: no prolapse             Vagina: normal physiologic d/c             Cervix: no lesions, no cervical motion tenderness               Uterus: normal size shape and contour, non-tender             Adnexa: no mass,  non-tender               Rectovaginal: external exam normal  Endometrial biopsy: Endometrial biopsy was attempted.  However, her cervix is flush with her vaginal wall, and no cervical loss dimpling was available to be visualized.  Her vaginal walls did  inhibit visualization, as did a stenotic os.  No biopsy was able to be collected.  Impression:   The encounter diagnosis was PMB (postmenopausal bleeding).  Plan:   - Postmenopausal bleeding with cervical stenosis and vaginal wall laxity:  The patient is predictably and appropriately concerned about her postmenopausal bleeding, and a thickened endometrial stripe of 15 mm.  I discussed with her and her daughter at length the next options.  I gave her the option of trying Cytotec and retrying endometrial biopsy, or going to the operating room for a D&C.  After discussion and consideration, she would like to proceed directly to a D&C hysteroscopy.  Those consents were signed today.  We discussed the possibilities of atrophy, benign endometrial polyp, hyperplasia and cancer. We discussed hysteroscopic polypectomy for the result of polyp. We discussed the possibilities of hyperplasia, treatment option dependent upon grade. We discussed oncology referral for carcinoma.  -History of DVT/PE on anticoagulation: Her primary care doctor who manages the anticoagulation is Dr. Andee LinemanFazia Kahn.  A D&C is a quick and low risk procedure, but her risk of bleeding is present.  We will discuss with Dr. Park BreedKahn if bridging is required for surgery.  I would feel comfortable doing a D&C with an INR of 2.0.  -History of heart disease: I would prefer that she did see Dr. Elijah Birkaldwell, who is her cardiologist, prior to surgery.  However she is resistant to this idea.  I will get a preoperative EKG, and her hypertension needs to be under control for the procedure.  For D&C, I am comfortable moving ahead with surgery.  -  Preoperative visit: D&C hysteroscopy. Consents signed today. Risks of surgery were discussed with the patient including but not limited to: bleeding which may require transfusion; infection which may require antibiotics; injury to uterus or surrounding organs; intrauterine scarring which may impair  future fertility; need for additional procedures including laparotomy or laparoscopy; and other postoperative/anesthesia complications. Written informed consent was obtained.  This is a scheduled same-day surgery. She will have a postop visit in 2 weeks to review operative findings and pathology.  No orders of the defined types were placed in this encounter.   Return in about 4 weeks (around 11/15/2016) for Postop check.  Cecilie KicksBETHANY EVANGELINE Dillinger Aston, MD

## 2016-10-21 NOTE — Patient Instructions (Signed)
Your procedure is scheduled on: Monday 10/25/16 Report to DAY SURGERY. 2ND FLOOR MEDICAL MALL ENTRANCE. To find out your arrival time please call [phone] between 1PM - 3PM on Friday 10/22/16.  Remember: Instructions that are not followed completely may result in serious medical risk, up to and including death, or upon the discretion of your surgeon and anesthesiologist your surgery may need to be rescheduled.    __X__ 1. Do not eat food or drink liquids after midnight. No gum chewing or hard candies.     __X__ 2. No Alcohol for 24 hours before or after surgery.   ____ 3. Bring all medications with you on the day of surgery if instructed.    __X__ 4. Notify your doctor if there is any change in your medical condition     (cold, fever, infections).             __X___5. No smoking within 24 hours of your surgery.     Do not wear jewelry, make-up, hairpins, clips or nail polish.  Do not wear lotions, powders, or perfumes.   Do not shave 48 hours prior to surgery. Men may shave face and neck.  Do not bring valuables to the hospital.    [facility] for any belongings or valuables.               Contacts, dentures or bridgework may not be worn into surgery.  Leave your suitcase in the car. After surgery it may be brought to your room.  For patients admitted to the hospital, discharge time is determined by your                treatment team.   Patients discharged the day of surgery will not be allowed to drive home.   Please read over the following fact sheets that you were given:   MRSA Information   __X__ Take these medicines the morning of surgery with A SIP OF WATER:    1. LISINOPRIL  2. LORATADINE  3. METOPROLOL  4. SERTRALINE  5.  6.  ____ Fleet Enema (as directed)   ____ Use CHG Soap as directed  ____ Use inhalers on the day of surgery  ____ Stop metformin 2 days prior to surgery    ____ Take 1/2 of usual insulin dose the night before surgery and  none on the morning of surgery.   __X__ Stop Coumadin/Plavix/aspirin on AS INTSTRUCTED BY YOUR DOCTOR  __X__ Stop Anti-inflammatories such as Advil, Aleve, Ibuprofen, Motrin, Naproxen, Naprosyn, Goodies,powder, or aspirin products.  OK to take Tylenol.   ____ Stop supplements until after surgery.    __X__ Bring C-Pap to the hospital.

## 2016-10-25 ENCOUNTER — Ambulatory Visit: Payer: PPO | Admitting: Registered Nurse

## 2016-10-25 ENCOUNTER — Ambulatory Visit
Admission: RE | Admit: 2016-10-25 | Discharge: 2016-10-25 | Disposition: A | Discharge status: Home / Self Care | Payer: PPO | Source: Ambulatory Visit | Attending: Obstetrics and Gynecology | Admitting: Obstetrics and Gynecology

## 2016-10-25 ENCOUNTER — Encounter: Payer: Self-pay | Admitting: *Deleted

## 2016-10-25 ENCOUNTER — Encounter
Admission: RE | Disposition: A | Discharge status: Home / Self Care | Payer: Self-pay | Source: Ambulatory Visit | Attending: Obstetrics and Gynecology

## 2016-10-25 DIAGNOSIS — Z79899 Other long term (current) drug therapy: Secondary | ICD-10-CM | POA: Insufficient documentation

## 2016-10-25 DIAGNOSIS — N840 Polyp of corpus uteri: Secondary | ICD-10-CM | POA: Insufficient documentation

## 2016-10-25 DIAGNOSIS — G4730 Sleep apnea, unspecified: Secondary | ICD-10-CM | POA: Insufficient documentation

## 2016-10-25 DIAGNOSIS — N858 Other specified noninflammatory disorders of uterus: Secondary | ICD-10-CM | POA: Diagnosis not present

## 2016-10-25 DIAGNOSIS — I4891 Unspecified atrial fibrillation: Secondary | ICD-10-CM | POA: Insufficient documentation

## 2016-10-25 DIAGNOSIS — F329 Major depressive disorder, single episode, unspecified: Secondary | ICD-10-CM | POA: Diagnosis not present

## 2016-10-25 DIAGNOSIS — I10 Essential (primary) hypertension: Secondary | ICD-10-CM | POA: Insufficient documentation

## 2016-10-25 DIAGNOSIS — N950 Postmenopausal bleeding: Secondary | ICD-10-CM | POA: Insufficient documentation

## 2016-10-25 DIAGNOSIS — R938 Abnormal findings on diagnostic imaging of other specified body structures: Secondary | ICD-10-CM | POA: Diagnosis not present

## 2016-10-25 HISTORY — PX: HYSTEROSCOPY WITH D & C: SHX1775

## 2016-10-25 LAB — PROTIME-INR
INR: 1.29
Prothrombin Time: 16.2 seconds — ABNORMAL HIGH (ref 11.4–15.2)

## 2016-10-25 LAB — APTT: APTT: 26 s (ref 24–36)

## 2016-10-25 LAB — ABO/RH: ABO/RH(D): A POS

## 2016-10-25 SURGERY — DILATATION AND CURETTAGE /HYSTEROSCOPY
Anesthesia: General

## 2016-10-25 MED ORDER — FAMOTIDINE 20 MG PO TABS
ORAL_TABLET | ORAL | Status: AC
  Filled 2016-10-25: qty 1

## 2016-10-25 MED ORDER — LIDOCAINE HCL (CARDIAC) 20 MG/ML IV SOLN
INTRAVENOUS | Status: DC | PRN
  Administered 2016-10-25: 80 mg via INTRAVENOUS

## 2016-10-25 MED ORDER — SUGAMMADEX SODIUM 500 MG/5ML IV SOLN
INTRAVENOUS | Status: AC
  Filled 2016-10-25: qty 5

## 2016-10-25 MED ORDER — FAMOTIDINE 20 MG PO TABS
20.0000 mg | ORAL_TABLET | Freq: Once | ORAL | Status: AC
  Administered 2016-10-25: 20 mg via ORAL

## 2016-10-25 MED ORDER — FENTANYL CITRATE (PF) 100 MCG/2ML IJ SOLN
25.0000 ug | INTRAMUSCULAR | Status: DC | PRN
  Administered 2016-10-25 (×4): 25 ug via INTRAVENOUS

## 2016-10-25 MED ORDER — SILVER NITRATE-POT NITRATE 75-25 % EX MISC
CUTANEOUS | Status: AC
  Filled 2016-10-25: qty 2

## 2016-10-25 MED ORDER — FENTANYL CITRATE (PF) 100 MCG/2ML IJ SOLN
INTRAMUSCULAR | Status: DC | PRN
  Administered 2016-10-25 (×2): 25 ug via INTRAVENOUS

## 2016-10-25 MED ORDER — PROPOFOL 10 MG/ML IV BOLUS
INTRAVENOUS | Status: AC
  Filled 2016-10-25: qty 20

## 2016-10-25 MED ORDER — EPHEDRINE 5 MG/ML INJ
INTRAVENOUS | Status: AC
  Filled 2016-10-25: qty 10

## 2016-10-25 MED ORDER — FENTANYL CITRATE (PF) 100 MCG/2ML IJ SOLN
INTRAMUSCULAR | Status: AC
  Filled 2016-10-25: qty 2

## 2016-10-25 MED ORDER — PROPOFOL 10 MG/ML IV BOLUS
INTRAVENOUS | Status: DC | PRN
  Administered 2016-10-25: 150 mg via INTRAVENOUS

## 2016-10-25 MED ORDER — ONDANSETRON HCL 4 MG/2ML IJ SOLN: 4.0000 mg | Freq: Once | INTRAMUSCULAR | Status: DC | PRN

## 2016-10-25 MED ORDER — FENTANYL CITRATE (PF) 100 MCG/2ML IJ SOLN
INTRAMUSCULAR | Status: AC
  Administered 2016-10-25: 25 ug via INTRAVENOUS
  Filled 2016-10-25: qty 2

## 2016-10-25 MED ORDER — ONDANSETRON HCL 4 MG/2ML IJ SOLN
INTRAMUSCULAR | Status: DC | PRN
  Administered 2016-10-25: 4 mg via INTRAVENOUS

## 2016-10-25 MED ORDER — DEXAMETHASONE SODIUM PHOSPHATE 10 MG/ML IJ SOLN
INTRAMUSCULAR | Status: DC | PRN
  Administered 2016-10-25: 5 mg via INTRAVENOUS

## 2016-10-25 MED ORDER — GLYCOPYRROLATE 0.2 MG/ML IJ SOLN
INTRAMUSCULAR | Status: AC
  Filled 2016-10-25: qty 1

## 2016-10-25 MED ORDER — ONDANSETRON HCL 4 MG/2ML IJ SOLN
INTRAMUSCULAR | Status: AC
  Filled 2016-10-25: qty 2

## 2016-10-25 MED ORDER — LACTATED RINGERS IV SOLN
INTRAVENOUS | Status: DC
  Administered 2016-10-25: 12:00:00 via INTRAVENOUS

## 2016-10-25 MED ORDER — LIDOCAINE HCL (PF) 2 % IJ SOLN
INTRAMUSCULAR | Status: AC
  Filled 2016-10-25: qty 2

## 2016-10-25 MED ORDER — DEXAMETHASONE SODIUM PHOSPHATE 10 MG/ML IJ SOLN
INTRAMUSCULAR | Status: AC
  Filled 2016-10-25: qty 1

## 2016-10-25 MED ORDER — EPHEDRINE SULFATE 50 MG/ML IJ SOLN
INTRAMUSCULAR | Status: DC | PRN
  Administered 2016-10-25: 15 mg via INTRAVENOUS
  Administered 2016-10-25: 10 mg via INTRAVENOUS
  Administered 2016-10-25: 15 mg via INTRAVENOUS

## 2016-10-25 SURGICAL SUPPLY — 21 items
BAG INFUSER PRESSURE 100CC (MISCELLANEOUS) ×3 IMPLANT
CANISTER SUCT 3000ML (MISCELLANEOUS) ×3 IMPLANT
CATH ROBINSON RED A/P 16FR (CATHETERS) ×3 IMPLANT
CORD URO TURP 10FT (MISCELLANEOUS) IMPLANT
ELECT LOOP MED HF 24F 12D (CUTTING LOOP) IMPLANT
ELECT REM PT RETURN 9FT ADLT (ELECTROSURGICAL) ×3
ELECT RESECT POWERBALL 24F (MISCELLANEOUS) IMPLANT
ELECTRODE REM PT RTRN 9FT ADLT (ELECTROSURGICAL) ×1 IMPLANT
GLOVE BIO SURGEON STRL SZ7 (GLOVE) ×3 IMPLANT
GLOVE INDICATOR 7.5 STRL GRN (GLOVE) ×3 IMPLANT
GOWN STRL REUS W/ TWL LRG LVL3 (GOWN DISPOSABLE) ×2 IMPLANT
GOWN STRL REUS W/TWL LRG LVL3 (GOWN DISPOSABLE) ×4
IV LACTATED RINGERS 1000ML (IV SOLUTION) ×3 IMPLANT
KIT RM TURNOVER CYSTO AR (KITS) ×3 IMPLANT
MYOSURE LITE POLYP REMOVAL (MISCELLANEOUS) ×3 IMPLANT
PACK DNC HYST (MISCELLANEOUS) ×3 IMPLANT
PAD OB MATERNITY 4.3X12.25 (PERSONAL CARE ITEMS) ×3 IMPLANT
PAD PREP 24X41 OB/GYN DISP (PERSONAL CARE ITEMS) ×3 IMPLANT
SEAL ROD LENS SCOPE MYOSURE (ABLATOR) ×3 IMPLANT
TUBING CONNECTING 10 (TUBING) ×2 IMPLANT
TUBING CONNECTING 10' (TUBING) ×1

## 2016-10-25 NOTE — Anesthesia Postprocedure Evaluation (Signed)
Anesthesia Post Note  Patient: Alison Russo  Procedure(s) Performed: Procedure(s) (LRB): DILATATION AND CURETTAGE /HYSTEROSCOPY, POLYPECTOMY (N/A)  Patient location during evaluation: PACU Anesthesia Type: General Level of consciousness: awake and alert and oriented Pain management: pain level controlled Vital Signs Assessment: post-procedure vital signs reviewed and stable Respiratory status: spontaneous breathing Cardiovascular status: blood pressure returned to baseline Anesthetic complications: no     Last Vitals:  Vitals:   10/25/16 1456 10/25/16 1528  BP: (!) 166/88 (!) 161/72  Pulse: 73 72  Resp: 16 16  Temp: 36.6 C 36.6 C    Last Pain:  Vitals:   10/25/16 1528  TempSrc: Temporal  PainSc:                  Alison Russo

## 2016-10-25 NOTE — OR Nursing (Signed)
Lab tech in for abo/rh and PT/PTT draw

## 2016-10-25 NOTE — OR Nursing (Signed)
Patient instructed to start all medications back today per Dr Dalbert GarnetBeasley.

## 2016-10-25 NOTE — Transfer of Care (Signed)
Immediate Anesthesia Transfer of Care Note  Patient: Charlott RakesKatherine L Billinger  Procedure(s) Performed: Procedure(s): DILATATION AND CURETTAGE /HYSTEROSCOPY, POLYPECTOMY (N/A)  Patient Location: PACU  Anesthesia Type:General  Level of Consciousness: sedated  Airway & Oxygen Therapy: Patient Spontanous Breathing and Patient connected to face mask oxygen  Post-op Assessment: Report given to RN and Post -op Vital signs reviewed and stable  Post vital signs: Reviewed and stable  Last Vitals:  Vitals:   10/25/16 1152 10/25/16 1357  BP: (!) 146/84 139/79  Pulse: 64 82  Resp: 18 10  Temp: 36.7 C 36.4 C    Complications: No apparent anesthesia complications

## 2016-10-25 NOTE — Interval H&P Note (Signed)
History and Physical Interval Note:  10/25/2016 12:28 PM  Alison Russo  has presented today for surgery, with the diagnosis of Thickened Endometium  PMB  The various methods of treatment have been discussed with the patient and family. After consideration of risks, benefits and other options for treatment, the patient has consented to  Procedure(s): DILATATION AND CURETTAGE /HYSTEROSCOPY (N/A) for postmenopausal bleeding as a surgical intervention .  The patient's history has been reviewed, patient examined, no change in status, stable for surgery.  I have reviewed the patient's chart and labs.  Questions were answered to the patient's satisfaction.     Christeen DouglasBEASLEY, [name]

## 2016-10-25 NOTE — Anesthesia Procedure Notes (Signed)
Procedure Name: LMA Insertion Date/Time: 10/25/2016 1:00 PM Performed by: Stormy FabianURTIS, Neliah Cuyler Pre-anesthesia Checklist: Patient identified, Patient being monitored, Timeout performed, Emergency Drugs available and Suction available Patient Re-evaluated:Patient Re-evaluated prior to inductionOxygen Delivery Method: Circle system utilized Preoxygenation: Pre-oxygenation with 100% oxygen Intubation Type: IV induction Ventilation: Mask ventilation without difficulty LMA: LMA inserted LMA Size: 4.5 Tube type: Oral Number of attempts: 1 Placement Confirmation: positive ETCO2 and breath sounds checked- equal and bilateral Tube secured with: Tape Dental Injury: Teeth and Oropharynx as per pre-operative assessment

## 2016-10-25 NOTE — Anesthesia Post-op Follow-up Note (Cosign Needed)
Anesthesia QCDR form completed.        

## 2016-10-25 NOTE — Anesthesia Preprocedure Evaluation (Signed)
Anesthesia Evaluation  Patient identified by MRN, date of birth, ID band Patient awake    Reviewed: Allergy & Precautions, NPO status , Patient's Chart, lab work & pertinent test results, reviewed documented beta blocker date and time   Airway Mallampati: II  TM Distance: >3 FB     Dental  (+) Partial Lower   Pulmonary shortness of breath and with exertion, sleep apnea ,  Hx of PE in the past   Pulmonary exam normal        Cardiovascular hypertension, Pt. on medications and Pt. on home beta blockers + angina Normal cardiovascular exam     Neuro/Psych negative neurological ROS  negative psych ROS   GI/Hepatic negative GI ROS, Neg liver ROS,   Endo/Other  negative endocrine ROS  Renal/GU negative Renal ROS  Female GU complaint     Musculoskeletal negative musculoskeletal ROS (+)   Abdominal Normal abdominal exam  (+)   Peds negative pediatric ROS (+)  Hematology negative hematology ROS (+)   Anesthesia Other Findings Past Medical History: No date: Anginal pain (HCC) No date: Dyspnea No date: History of blood clots No date: Hypertension No date: PE (pulmonary embolism) No date: Sleep apnea  Reproductive/Obstetrics                             Anesthesia Physical Anesthesia Plan  ASA: III  Anesthesia Plan: General   Post-op Pain Management:    Induction: Intravenous  Airway Management Planned: LMA  Additional Equipment:   Intra-op Plan:   Post-operative Plan: Extubation in OR  Informed Consent: I have reviewed the patients History and Physical, chart, labs and discussed the procedure including the risks, benefits and alternatives for the proposed anesthesia with the patient or authorized representative who has indicated his/her understanding and acceptance.   Dental advisory given  Plan Discussed with: CRNA and Surgeon  Anesthesia Plan Comments:         Anesthesia  Quick Evaluation

## 2016-10-25 NOTE — Discharge Instructions (Signed)

## 2016-10-25 NOTE — Op Note (Signed)
Operative Report Hysteroscopy with Dilation and Curettage   Indications: Thickened endometrial stripe   Pre-operative Diagnosis: Postmenopausal bleeding   Post-operative Diagnosis: Endometrial polyps.  Procedure: 1. Exam under anesthesia 2. Fractional D&C 3. Hysteroscopy 4. Polypectomy  Surgeon: Christeen DouglasBethany Garlan Drewes, MD  Assistant(s):  None  Anesthesia: General endotracheal anesthesia  Anesthesiologist: Yves DillPaul Carroll, MD Anesthesiologist: Yves DillPaul Carroll, MD CRNA: Stormy FabianLinda Curtis, CRNA  Estimated Blood Loss:  Minimal         Intraoperative medications: n/a         Total IV Fluids: 600ml  Urine Output: 75ml  Total Fluid Deficit:  n/a          Specimens: Endocervical curettings, endometrial curettings         Complications:  None; patient tolerated the procedure well.         Disposition: PACU - hemodynamically stable.         Condition: stable  Findings: Uterus measuring 7 cm by sound; normal cervix, vagina, perineum. Multiple endometrial polyps, one large one at the fundus that was probably responsible for her thickened endometrial stripe. Large external hemorrhoid.   Indication for procedure/Consents: 83 y.o.  here for scheduled surgery for the aforementioned diagnoses.  Risks of surgery were discussed with the patient including but not limited to: bleeding which may require transfusion; infection which may require antibiotics; injury to uterus or surrounding organs; intrauterine scarring which may impair future fertility; need for additional procedures including laparotomy or laparoscopy; and other postoperative/anesthesia complications. Written informed consent was obtained.    Procedure Details:   D&C/ Myosure  The patient was taken to the operating room where anesthesia was administered and was found to be adequate. After a formal and adequate timeout was performed, she was placed in the dorsal lithotomy position and examined with the above findings. She was then prepped  and draped in the sterile manner. Her bladder was catheterized for an estimated amount of clear, yellow urine. A weighed speculum was then placed in the patient's vagina and a single tooth tenaculum was applied to the anterior lip of the cervix.  Her cervix was serially dilated to 15 [place] using Hanks dilators. An ECC was performed. The hysteroscope was introduced under direct observation  Using lactated ringers as a distention medium to reveal the above findings. The uterine cavity was carefully examined, both ostia were recognized, and atrophic endometrium with the polyp above were noted.   This was resected using the Myosure device.  After further careful visualization of the uterine cavity, the hysteroscope was removed under direct visualization.  A sharp curettage was then performed until there was a gritty texture in all four quadrants. The tenaculum was removed from the anterior lip of the cervix and the vaginal speculum was removed after applying silver nitrate for good hemostasis.   The patient tolerated the procedure well and was taken to the recovery area awake and in stable condition. She received iv acetaminophen and Toradol prior to leaving the OR.  The patient will be discharged to home as per PACU criteria. Routine postoperative instructions given. She was prescribed Ibuprofen and Colace. She will follow up in the clinic in two weeks for postoperative evaluation.

## 2016-10-26 ENCOUNTER — Encounter: Payer: Self-pay | Admitting: Obstetrics and Gynecology

## 2016-10-26 LAB — SURGICAL PATHOLOGY

## 2016-11-01 DIAGNOSIS — I481 Persistent atrial fibrillation: Secondary | ICD-10-CM | POA: Diagnosis not present

## 2016-11-01 DIAGNOSIS — N950 Postmenopausal bleeding: Secondary | ICD-10-CM | POA: Diagnosis not present

## 2016-11-01 DIAGNOSIS — Z7902 Long term (current) use of antithrombotics/antiplatelets: Secondary | ICD-10-CM | POA: Diagnosis not present

## 2016-11-05 DIAGNOSIS — H26491 Other secondary cataract, right eye: Secondary | ICD-10-CM | POA: Diagnosis not present

## 2016-11-15 DIAGNOSIS — Z7901 Long term (current) use of anticoagulants: Secondary | ICD-10-CM | POA: Diagnosis not present

## 2016-12-13 DIAGNOSIS — Z7901 Long term (current) use of anticoagulants: Secondary | ICD-10-CM | POA: Diagnosis not present

## 2017-01-12 ENCOUNTER — Other Ambulatory Visit: Payer: Self-pay | Admitting: Internal Medicine

## 2017-01-12 DIAGNOSIS — Z1231 Encounter for screening mammogram for malignant neoplasm of breast: Secondary | ICD-10-CM

## 2017-01-13 DIAGNOSIS — Z7901 Long term (current) use of anticoagulants: Secondary | ICD-10-CM | POA: Diagnosis not present

## 2017-02-02 ENCOUNTER — Ambulatory Visit: Payer: PPO

## 2017-02-16 DIAGNOSIS — Z7901 Long term (current) use of anticoagulants: Secondary | ICD-10-CM | POA: Diagnosis not present

## 2017-03-17 DIAGNOSIS — Z7901 Long term (current) use of anticoagulants: Secondary | ICD-10-CM | POA: Diagnosis not present

## 2017-03-21 DIAGNOSIS — I10 Essential (primary) hypertension: Secondary | ICD-10-CM | POA: Diagnosis not present

## 2017-03-21 DIAGNOSIS — Z7901 Long term (current) use of anticoagulants: Secondary | ICD-10-CM | POA: Diagnosis not present

## 2017-03-21 DIAGNOSIS — I481 Persistent atrial fibrillation: Secondary | ICD-10-CM | POA: Diagnosis not present

## 2017-03-21 DIAGNOSIS — G4730 Sleep apnea, unspecified: Secondary | ICD-10-CM | POA: Diagnosis not present

## 2017-03-21 DIAGNOSIS — F329 Major depressive disorder, single episode, unspecified: Secondary | ICD-10-CM | POA: Diagnosis not present

## 2017-04-18 DIAGNOSIS — Z7901 Long term (current) use of anticoagulants: Secondary | ICD-10-CM | POA: Diagnosis not present

## 2017-05-09 DIAGNOSIS — Z7901 Long term (current) use of anticoagulants: Secondary | ICD-10-CM | POA: Diagnosis not present

## 2017-05-09 DIAGNOSIS — I10 Essential (primary) hypertension: Secondary | ICD-10-CM | POA: Diagnosis not present

## 2017-05-09 DIAGNOSIS — R609 Edema, unspecified: Secondary | ICD-10-CM | POA: Diagnosis not present

## 2017-05-09 DIAGNOSIS — F411 Generalized anxiety disorder: Secondary | ICD-10-CM | POA: Diagnosis not present

## 2017-05-09 DIAGNOSIS — L299 Pruritus, unspecified: Secondary | ICD-10-CM | POA: Diagnosis not present

## 2017-05-09 DIAGNOSIS — H6123 Impacted cerumen, bilateral: Secondary | ICD-10-CM | POA: Diagnosis not present

## 2017-05-17 DIAGNOSIS — Z7901 Long term (current) use of anticoagulants: Secondary | ICD-10-CM | POA: Diagnosis not present

## 2017-05-19 DIAGNOSIS — H353132 Nonexudative age-related macular degeneration, bilateral, intermediate dry stage: Secondary | ICD-10-CM | POA: Diagnosis not present

## 2017-07-05 DIAGNOSIS — Z7901 Long term (current) use of anticoagulants: Secondary | ICD-10-CM | POA: Diagnosis not present

## 2017-07-27 DIAGNOSIS — L299 Pruritus, unspecified: Secondary | ICD-10-CM | POA: Diagnosis not present

## 2017-07-27 DIAGNOSIS — R609 Edema, unspecified: Secondary | ICD-10-CM | POA: Diagnosis not present

## 2017-07-27 DIAGNOSIS — Z7901 Long term (current) use of anticoagulants: Secondary | ICD-10-CM | POA: Diagnosis not present

## 2017-07-27 DIAGNOSIS — I10 Essential (primary) hypertension: Secondary | ICD-10-CM | POA: Diagnosis not present

## 2017-07-30 DIAGNOSIS — W450XXA Nail entering through skin, initial encounter: Secondary | ICD-10-CM | POA: Diagnosis not present

## 2017-07-30 DIAGNOSIS — S91301A Unspecified open wound, right foot, initial encounter: Secondary | ICD-10-CM | POA: Diagnosis not present

## 2017-08-10 DIAGNOSIS — Z7901 Long term (current) use of anticoagulants: Secondary | ICD-10-CM | POA: Diagnosis not present

## 2017-08-10 DIAGNOSIS — I10 Essential (primary) hypertension: Secondary | ICD-10-CM | POA: Diagnosis not present

## 2017-08-10 DIAGNOSIS — R609 Edema, unspecified: Secondary | ICD-10-CM | POA: Diagnosis not present

## 2017-08-10 DIAGNOSIS — Z0001 Encounter for general adult medical examination with abnormal findings: Secondary | ICD-10-CM | POA: Diagnosis not present

## 2017-08-10 DIAGNOSIS — F330 Major depressive disorder, recurrent, mild: Secondary | ICD-10-CM | POA: Diagnosis not present

## 2017-08-10 DIAGNOSIS — I481 Persistent atrial fibrillation: Secondary | ICD-10-CM | POA: Diagnosis not present

## 2017-09-28 ENCOUNTER — Other Ambulatory Visit: Payer: Self-pay | Admitting: Nurse Practitioner

## 2017-09-28 DIAGNOSIS — Z0001 Encounter for general adult medical examination with abnormal findings: Secondary | ICD-10-CM | POA: Diagnosis not present

## 2017-09-28 DIAGNOSIS — E782 Mixed hyperlipidemia: Secondary | ICD-10-CM | POA: Diagnosis not present

## 2017-09-28 DIAGNOSIS — I10 Essential (primary) hypertension: Secondary | ICD-10-CM | POA: Diagnosis not present

## 2017-10-07 LAB — COMPREHENSIVE METABOLIC PANEL
ALBUMIN: 3.9 g/dL (ref 3.5–4.7)
ALK PHOS: 71 IU/L (ref 39–117)
ALT: 13 IU/L (ref 0–32)
AST: 14 IU/L (ref 0–40)
Albumin/Globulin Ratio: 1.4 (ref 1.2–2.2)
BILIRUBIN TOTAL: 0.3 mg/dL (ref 0.0–1.2)
BUN / CREAT RATIO: 20 (ref 12–28)
BUN: 16 mg/dL (ref 8–27)
CHLORIDE: 107 mmol/L — AB (ref 96–106)
CO2: 22 mmol/L (ref 20–29)
Calcium: 9.3 mg/dL (ref 8.7–10.3)
Creatinine, Ser: 0.79 mg/dL (ref 0.57–1.00)
GFR calc Af Amer: 77 mL/min/{1.73_m2} (ref >59)
GFR calc non Af Amer: 67 mL/min/{1.73_m2} (ref >59)
GLUCOSE: 88 mg/dL (ref 65–99)
Globulin, Total: 2.8 g/dL (ref 1.5–4.5)
Potassium: 4.5 mmol/L (ref 3.5–5.2)
Sodium: 143 mmol/L (ref 134–144)
Total Protein: 6.7 g/dL (ref 6.0–8.5)

## 2017-10-07 LAB — CBC
HEMATOCRIT: 36.4 % (ref 34.0–46.6)
Hemoglobin: 11.3 g/dL (ref 11.1–15.9)
MCH: 25.1 pg — ABNORMAL LOW (ref 26.6–33.0)
MCHC: 31 g/dL — ABNORMAL LOW (ref 31.5–35.7)
MCV: 81 fL (ref 79–97)
Platelets: 214 10*3/uL (ref 150–379)
RBC: 4.5 x10E6/uL (ref 3.77–5.28)
RDW: 16.1 % — AB (ref 12.3–15.4)
WBC: 8.2 10*3/uL (ref 3.4–10.8)

## 2017-10-07 LAB — TSH: TSH: 2.13 u[IU]/mL (ref 0.450–4.500)

## 2017-10-07 LAB — LIPID PANEL W/O CHOL/HDL RATIO
Cholesterol, Total: 218 mg/dL — ABNORMAL HIGH (ref 100–199)
HDL: 46 mg/dL (ref >39)
LDL Calculated: 144 mg/dL — ABNORMAL HIGH (ref 0–99)
Triglycerides: 142 mg/dL (ref 0–149)
VLDL Cholesterol Cal: 28 mg/dL (ref 5–40)

## 2017-10-07 LAB — PROTIME-INR
INR: 1.9 — AB (ref 0.8–1.2)
Prothrombin Time: 19.2 s — ABNORMAL HIGH (ref 9.1–12.0)

## 2017-10-07 LAB — T4, FREE: Free T4: 0.85 ng/dL (ref 0.82–1.77)

## 2017-11-17 DIAGNOSIS — H40003 Preglaucoma, unspecified, bilateral: Secondary | ICD-10-CM | POA: Diagnosis not present

## 2017-11-22 ENCOUNTER — Other Ambulatory Visit: Payer: Self-pay | Admitting: Internal Medicine

## 2017-11-22 DIAGNOSIS — Z7901 Long term (current) use of anticoagulants: Secondary | ICD-10-CM | POA: Diagnosis not present

## 2017-11-23 LAB — PROTIME-INR
INR: 1.5 — AB (ref 0.8–1.2)
Prothrombin Time: 15.6 s — ABNORMAL HIGH (ref 9.1–12.0)

## 2017-11-24 DIAGNOSIS — H40003 Preglaucoma, unspecified, bilateral: Secondary | ICD-10-CM | POA: Diagnosis not present

## 2017-12-08 ENCOUNTER — Telehealth: Payer: Self-pay

## 2017-12-08 ENCOUNTER — Other Ambulatory Visit: Payer: Self-pay

## 2017-12-09 NOTE — Telephone Encounter (Signed)
Sent note to provider

## 2017-12-09 NOTE — Telephone Encounter (Signed)
From what I can tell, this medication might be prescribed per cardiology. I don't see an issue with taking 1 tablet twice daily, but she may want to ask cardiology just to vverify this.

## 2017-12-12 ENCOUNTER — Telehealth: Payer: Self-pay

## 2017-12-12 NOTE — Telephone Encounter (Signed)
Left vm advising pt to contact cardiology about metoprolol medication per Heather.  dbs

## 2017-12-20 ENCOUNTER — Other Ambulatory Visit: Payer: Self-pay

## 2017-12-20 ENCOUNTER — Inpatient Hospital Stay
Admission: EM | Admit: 2017-12-20 | Discharge: 2017-12-22 | DRG: 282 | Disposition: A | Discharge status: Other Acute Inpatient Hospital | Payer: PPO | Attending: Internal Medicine | Admitting: Internal Medicine

## 2017-12-20 ENCOUNTER — Emergency Department: Payer: PPO

## 2017-12-20 DIAGNOSIS — Z6832 Body mass index (BMI) 32.0-32.9, adult: Secondary | ICD-10-CM

## 2017-12-20 DIAGNOSIS — F329 Major depressive disorder, single episode, unspecified: Secondary | ICD-10-CM | POA: Diagnosis present

## 2017-12-20 DIAGNOSIS — D649 Anemia, unspecified: Secondary | ICD-10-CM | POA: Diagnosis present

## 2017-12-20 DIAGNOSIS — Z803 Family history of malignant neoplasm of breast: Secondary | ICD-10-CM | POA: Diagnosis not present

## 2017-12-20 DIAGNOSIS — Z79899 Other long term (current) drug therapy: Secondary | ICD-10-CM

## 2017-12-20 DIAGNOSIS — Z7901 Long term (current) use of anticoagulants: Secondary | ICD-10-CM

## 2017-12-20 DIAGNOSIS — I25110 Atherosclerotic heart disease of native coronary artery with unstable angina pectoris: Secondary | ICD-10-CM | POA: Diagnosis present

## 2017-12-20 DIAGNOSIS — Z86711 Personal history of pulmonary embolism: Secondary | ICD-10-CM

## 2017-12-20 DIAGNOSIS — G4733 Obstructive sleep apnea (adult) (pediatric): Secondary | ICD-10-CM | POA: Diagnosis present

## 2017-12-20 DIAGNOSIS — I10 Essential (primary) hypertension: Secondary | ICD-10-CM | POA: Diagnosis present

## 2017-12-20 DIAGNOSIS — I200 Unstable angina: Secondary | ICD-10-CM | POA: Diagnosis not present

## 2017-12-20 DIAGNOSIS — Z86718 Personal history of other venous thrombosis and embolism: Secondary | ICD-10-CM

## 2017-12-20 DIAGNOSIS — I214 Non-ST elevation (NSTEMI) myocardial infarction: Secondary | ICD-10-CM | POA: Diagnosis present

## 2017-12-20 DIAGNOSIS — E669 Obesity, unspecified: Secondary | ICD-10-CM | POA: Diagnosis present

## 2017-12-20 DIAGNOSIS — R079 Chest pain, unspecified: Secondary | ICD-10-CM | POA: Diagnosis present

## 2017-12-20 LAB — CBC WITH DIFFERENTIAL/PLATELET
Basophils Absolute: 0.1 10*3/uL (ref 0–0.1)
Basophils Relative: 1 %
EOS ABS: 0.2 10*3/uL (ref 0–0.7)
Eosinophils Relative: 2 %
HEMATOCRIT: 36.6 % (ref 35.0–47.0)
HEMOGLOBIN: 11.5 g/dL — AB (ref 12.0–16.0)
LYMPHS ABS: 1.1 10*3/uL (ref 1.0–3.6)
LYMPHS PCT: 13 %
MCH: 25.4 pg — AB (ref 26.0–34.0)
MCHC: 31.4 g/dL — AB (ref 32.0–36.0)
MCV: 81.1 fL (ref 80.0–100.0)
MONOS PCT: 6 %
Monocytes Absolute: 0.5 10*3/uL (ref 0.2–0.9)
NEUTROS ABS: 6.6 10*3/uL — AB (ref 1.4–6.5)
NEUTROS PCT: 78 %
Platelets: 203 10*3/uL (ref 150–440)
RBC: 4.52 MIL/uL (ref 3.80–5.20)
RDW: 16.9 % — ABNORMAL HIGH (ref 11.5–14.5)
WBC: 8.4 10*3/uL (ref 3.6–11.0)

## 2017-12-20 LAB — PROTIME-INR
INR: 1.08
Prothrombin Time: 13.9 seconds (ref 11.4–15.2)

## 2017-12-20 LAB — BASIC METABOLIC PANEL
Anion gap: 8 (ref 5–15)
BUN: 22 mg/dL — AB (ref 6–20)
CO2: 25 mmol/L (ref 22–32)
Calcium: 9.2 mg/dL (ref 8.9–10.3)
Chloride: 108 mmol/L (ref 101–111)
Creatinine, Ser: 0.87 mg/dL (ref 0.44–1.00)
GFR calc Af Amer: >60 mL/min (ref >60)
GFR calc non Af Amer: 57 mL/min — ABNORMAL LOW (ref >60)
Glucose, Bld: 202 mg/dL — ABNORMAL HIGH (ref 65–99)
POTASSIUM: 4.9 mmol/L (ref 3.5–5.1)
SODIUM: 141 mmol/L (ref 135–145)

## 2017-12-20 LAB — TROPONIN I: TROPONIN I: 0.06 ng/mL — AB (ref <0.03)

## 2017-12-20 LAB — BRAIN NATRIURETIC PEPTIDE: B NATRIURETIC PEPTIDE 5: 155 pg/mL — AB (ref 0.0–100.0)

## 2017-12-20 NOTE — H&P (Signed)
[facility] - [place] at [facility]   PATIENT NAME: Alison Russo    MR#:  [id number]  DATE OF BIRTH:  [birth year]  DATE OF ADMISSION:  12/20/2017  PRIMARY CARE PHYSICIAN: Lyndon Code, MD   REQUESTING/REFERRING PHYSICIAN:   CHIEF COMPLAINT:   Chief Complaint  Patient presents with  . Chest Pain    HISTORY OF PRESENT ILLNESS: Alison Russo  is a 83 y.o. female with a known history of spontaneous bilateral PE, on chronic anticoagulation, angina, hypertension and sleep apnea. Patient was brought to emergency room for acute onset of retrosternal chest pain described as constant 5 out of 10 pressure with radiation to both arms and to her jaw.  Shortness of breath, nausea and diaphoresis were associated with the chest pain.  No fever/chills, no cough.  Her symptoms started at rest approximately 3-4 hours before presenting to emergency room.  Per patient, her CP was worse with exertion, but it resolved with sublingual nitroglycerin given by paramedics. While in the emergency room, her oxygen saturation was 90-93% on room air.  Troponin level is slightly elevated at 0.06.  EKG, reviewed by myself, shows normal sinus rhythm, normal axis.  No acute ischemic changes.  Chest x-ray, reviewed by myself, is negative for any acute cardiopulmonary changes. Patient is admitted for further evaluation and treatment.  PAST MEDICAL HISTORY:   Past Medical History:  Diagnosis Date  . Anginal pain (HCC)   . Dyspnea   . History of blood clots   . Hypertension   . PE (pulmonary embolism)   . Sleep apnea     PAST SURGICAL HISTORY:  Past Surgical History:  Procedure Laterality Date  . BREAST CYST ASPIRATION Right    neg  . CARDIAC CATHETERIZATION    . COLONOSCOPY    . HYSTEROSCOPY W/D&C N/A 10/25/2016   Procedure: DILATATION AND CURETTAGE /HYSTEROSCOPY, POLYPECTOMY;  Surgeon: Christeen Douglas, MD;  Location: [facility] ORS;  Service: Gynecology;  Laterality: N/A;  . TONSILLECTOMY       SOCIAL HISTORY:  Social History   Tobacco Use  . Smoking status: Never Smoker  . Smokeless tobacco: Never Used  Substance Use Topics  . Alcohol use: No    FAMILY HISTORY:  Family History  Problem Relation Age of Onset  . Breast cancer Mother 100    DRUG ALLERGIES: No Known Allergies  REVIEW OF SYSTEMS:   CONSTITUTIONAL: No fever, fatigue or weakness.  EYES: No blurred or double vision.  EARS, NOSE, AND THROAT: No tinnitus or ear pain.  RESPIRATORY: No cough, wheezing or hemoptysis.  CARDIOVASCULAR: Positive for chest pain associated with shortness of breath and diaphoresis. No orthopnea, edema.  GASTROINTESTINAL: Positive for nausea and one episode of vomiting; no diarrhea or abdominal pain.  GENITOURINARY: No dysuria, hematuria.  ENDOCRINE: No polyuria, nocturia,  HEMATOLOGY: No bleeding SKIN: No rash or lesion. MUSCULOSKELETAL: No joint pain or arthritis.   NEUROLOGIC: No  focal weakness.  PSYCHIATRY: No anxiety or depression.   MEDICATIONS AT HOME:  Prior to Admission medications   Medication Sig Start Date End Date Taking? Authorizing Provider  acetaminophen (TYLENOL) 500 MG tablet Take 1,000 mg by mouth 2 (two) times daily as needed (for pain/headache/fever).   Yes [provider]  CALCIUM PO Take 1,200 mg by mouth daily.   Yes [provider]  Cyanocobalamin 2500 MCG SUBL Place 2,500 mcg under the tongue daily.   Yes [provider]  ferrous sulfate (FERROUSUL) 325 (65 FE) MG tablet Take 325  mg by mouth daily.   Yes [provider]  lisinopril (PRINIVIL,ZESTRIL) 5 MG tablet Take 5 mg by mouth daily. 09/06/16  Yes [provider]  loratadine (CLARITIN) 10 MG tablet Take 10 mg by mouth daily.   Yes [provider]  metoprolol tartrate (LOPRESSOR) 25 MG tablet Take 12.5 mg by mouth 2 (two) times daily. 10/15/16  Yes [provider]  Multiple Vitamins-Minerals (PRESERVISION AREDS 2+MULTI VIT PO) Take 1  tablet by mouth 2 (two) times daily.   Yes [provider]  Naphazoline-Pheniramine (OPCON-A OP) Place 1-2 drops into both eyes 3 (three) times daily as needed (for itchy/irritated eyes.).   Yes [provider]  sertraline (ZOLOFT) 100 MG tablet Take 100 mg by mouth daily. 10/15/16  Yes [provider]  vitamin C (ASCORBIC ACID) 500 MG tablet Take 500 mg by mouth daily.   Yes [provider]  warfarin (COUMADIN) 5 MG tablet Take 5 mg by mouth See admin instructions. 5 mg daily except on Sundays and Thursday 07/23/16  Yes [provider]  metoprolol tartrate (LOPRESSOR) 12.5 mg TABS tablet Take 12.5 mg by mouth 2 (two) times daily.    [provider]      PHYSICAL EXAMINATION:   VITAL SIGNS: Blood pressure 116/79, pulse (!) 52, temperature 98.1 F (36.7 C), temperature source Oral, resp. rate (!) 24, height 5\' 6"  (1.676 m), weight 90.7 kg (200 lb), SpO2 96 %.  GENERAL:  84 y.o.-year-old patient lying in the bed with no acute distress.  EYES: Pupils equal, round, reactive to light and accommodation. No scleral icterus.   HEENT: Head atraumatic, normocephalic. Oropharynx and nasopharynx clear.  NECK:  Supple, no jugular venous distention. No thyroid enlargement, no tenderness.  LUNGS: Normal breath sounds bilaterally, no wheezing, rales,rhonchi or crepitation. No use of accessory muscles of respiration.  CARDIOVASCULAR: S1, S2 normal. No S3/S4.  ABDOMEN: Soft, nontender, nondistended. Bowel sounds present. No organomegaly or mass.  EXTREMITIES: No pedal edema, cyanosis, or clubbing.  NEUROLOGIC: No focal weakness.  PSYCHIATRIC: The patient is alert and oriented x 3.  SKIN: No obvious rash, lesion, or ulcer.   LABORATORY PANEL:   CBC Recent Labs  Lab 12/20/17 2134  WBC 8.4  HGB 11.5*  HCT 36.6  PLT 203  MCV 81.1  MCH 25.4*  MCHC 31.4*  RDW 16.9*  LYMPHSABS 1.1  MONOABS 0.5  EOSABS 0.2  BASOSABS 0.1    ------------------------------------------------------------------------------------------------------------------  Chemistries  Recent Labs  Lab 12/20/17 2134  NA 141  K 4.9  CL 108  CO2 25  GLUCOSE 202*  BUN 22*  CREATININE 0.87  CALCIUM 9.2   ------------------------------------------------------------------------------------------------------------------ estimated creatinine clearance is 49.8 mL/min (by C-G formula based on SCr of 0.87 mg/dL). ------------------------------------------------------------------------------------------------------------------ No results for input(s): TSH, T4TOTAL, T3FREE, THYROIDAB in the last 72 hours.  Invalid input(s): FREET3   Coagulation profile Recent Labs  Lab 12/20/17 2135  INR 1.08   ------------------------------------------------------------------------------------------------------------------- No results for input(s): DDIMER in the last 72 hours. -------------------------------------------------------------------------------------------------------------------  Cardiac Enzymes Recent Labs  Lab 12/20/17 2134  TROPONINI 0.06*   ------------------------------------------------------------------------------------------------------------------ Invalid input(s): POCBNP  ---------------------------------------------------------------------------------------------------------------  Urinalysis    Component Value Date/Time   COLORURINE RED (A) 10/16/2016 1139   APPEARANCEUR CLOUDY (A) 10/16/2016 1139   LABSPEC 1.023 10/16/2016 1139   PHURINE  10/16/2016 1139    TEST NOT REPORTED DUE TO COLOR INTERFERENCE OF URINE PIGMENT   GLUCOSEU (A) 10/16/2016 1139    TEST NOT REPORTED DUE TO COLOR INTERFERENCE OF URINE  PIGMENT   HGBUR (A) 10/16/2016 1139    TEST NOT REPORTED DUE TO COLOR INTERFERENCE OF URINE PIGMENT   BILIRUBINUR (A) 10/16/2016 1139    TEST NOT REPORTED DUE TO COLOR INTERFERENCE OF URINE PIGMENT   KETONESUR (A)  10/16/2016 1139    TEST NOT REPORTED DUE TO COLOR INTERFERENCE OF URINE PIGMENT   PROTEINUR (A) 10/16/2016 1139    TEST NOT REPORTED DUE TO COLOR INTERFERENCE OF URINE PIGMENT   NITRITE (A) 10/16/2016 1139    TEST NOT REPORTED DUE TO COLOR INTERFERENCE OF URINE PIGMENT   LEUKOCYTESUR (A) 10/16/2016 1139    TEST NOT REPORTED DUE TO COLOR INTERFERENCE OF URINE PIGMENT     RADIOLOGY: Dg Chest 1 View  Result Date: 12/20/2017 CLINICAL DATA:  Chest pain EXAM: CHEST  1 VIEW COMPARISON:  05/23/2007 FINDINGS: Cardiac shadow is mildly enlarged. Aortic calcifications are again seen. Some increased density is noted in the medial aspect of the right apex increased from the prior exam. Previously these changes were related to prominent mediastinal soft tissues it would be difficult to exclude an underlying infiltrate. Mild interstitial changes of a chronic nature are seen. No focal infiltrate or sizable effusion is noted. IMPRESSION: Increased density in the right apex which may represent an underlying infiltrate. Electronically Signed   By: Alcide CleverMark  Lukens M.D.   On: 12/20/2017 21:56    EKG: Orders placed or performed during the hospital encounter of 12/20/17  . ED EKG  . ED EKG  . EKG 12-Lead  . EKG 12-Lead  . EKG 12-Lead  . EKG 12-Lead    IMPRESSION AND PLAN:   1.  Non-ST elevation MI.  Patient started on aspirin.  We will continue to monitor on telemetry and follow troponin levels.  Cardiology is consulted for further evaluation and treatment. 2.  History of spontaneous bilateral PE, on chronic anticoagulation with Coumadin.  INR is Currently subtherapeutic at 1.08.  Will restart Coumadin and monitor INR closely. 3.  Hypertension, stable, restart home medications.  All the records are reviewed and case discussed with ED provider. Management plans discussed with the patient, family and they are in agreement.  CODE STATUS: Advance Directive Documentation     Most Recent Value  Type of Advance  Directive  Healthcare Power of Attorney, Living will  Pre-existing out of facility DNR order (yellow form or pink MOST form)  -  "MOST" Form in Place?  -       TOTAL TIME TAKING CARE OF THIS PATIENT: 45 minutes.    Cammy CopaAngela Rodolph Hagemann M.D on 12/20/2017 at 11:43 PM  Between 7am to 6pm - Pager - [phone]  After 6pm go to [email or web address] - password [email or web address] [facility]  [place] [facility]  Office  [phone]  CC: Primary care physician; Lyndon CodeKhan, Fozia M, MD

## 2017-12-20 NOTE — ED Triage Notes (Signed)
Pt to the er for chest pain. 9O on room air. 97 to 98 on 3L. Nitro spray given x 1. Chest pain improved. Pain free at this time. 180/100, 150/90 per EMS. Blood sugar 247.

## 2017-12-20 NOTE — ED Provider Notes (Signed)
[facility] Emergency Department Provider Note  ____________________________________________   First MD Initiated Contact with Patient 12/20/17 2137     (approximate)  I have reviewed the triage vital signs and the nursing notes.   HISTORY  Chief Complaint Chest Pain   HPI Alison Russo is a 84 y.o. female with a history of angina as well as pulmonary embolus was presenting to the emergency department today with pressure-like chest pain across the front of her chest that radiates into both arms and up to her jaw.  She says it is associated with shortness of breath and worsens with exertion.  Per EMS, the patient had a 5 out of 10 pain and was given a nitro spray and is now pain-free.  She was also found to be 90-93% on room air and so was placed on 2 L nasal cannula oxygen.  Patient notes a history of angina but no previous stents or open heart surgery.  She also had 324 mg of aspirin prior to arrival.  Says that she also had an episode of vomiting tonight secondary to the pain.  Past Medical History:  Diagnosis Date  . Anginal pain (HCC)   . Dyspnea   . History of blood clots   . Hypertension   . PE (pulmonary embolism)   . Sleep apnea     There are no active problems to display for this patient.   Past Surgical History:  Procedure Laterality Date  . BREAST CYST ASPIRATION Right    neg  . CARDIAC CATHETERIZATION    . COLONOSCOPY    . HYSTEROSCOPY W/D&C N/A 10/25/2016   Procedure: DILATATION AND CURETTAGE /HYSTEROSCOPY, POLYPECTOMY;  Surgeon: Christeen DouglasBethany Beasley, MD;  Location: [facility] ORS;  Service: Gynecology;  Laterality: N/A;  . TONSILLECTOMY      Prior to Admission medications   Medication Sig Start Date End Date Taking? Authorizing Provider  acetaminophen (TYLENOL) 500 MG tablet Take 1,000 mg by mouth 2 (two) times daily as needed (for pain/headache/fever).    [provider]  CALCIUM PO Take 1,200 mg by mouth daily.    [provider]  Cyanocobalamin 2500 MCG SUBL Place 2,500 mcg under the tongue daily.    [provider]  ferrous sulfate (FERROUSUL) 325 (65 FE) MG tablet Take 325 mg by mouth daily.    [provider]  lisinopril (PRINIVIL,ZESTRIL) 5 MG tablet Take 5 mg by mouth daily. 09/06/16   [provider]  loratadine (CLARITIN) 10 MG tablet Take 10 mg by mouth daily.    [provider]  metoprolol tartrate (LOPRESSOR) 12.5 mg TABS tablet Take 12.5 mg by mouth 2 (two) times daily.    [provider]  metoprolol tartrate (LOPRESSOR) 25 MG tablet Take 12.5 mg by mouth 2 (two) times daily. 10/15/16   [provider]  Multiple Vitamins-Minerals (PRESERVISION AREDS 2+MULTI VIT PO) Take 1 tablet by mouth 2 (two) times daily.    [provider]  Naphazoline-Pheniramine (OPCON-A OP) Place 1-2 drops into both eyes 3 (three) times daily as needed (for itchy/irritated eyes.).    [provider]  sertraline (ZOLOFT) 100 MG tablet Take 100 mg by mouth daily. 10/15/16   [provider]  vitamin C (ASCORBIC ACID) 500 MG tablet Take 500 mg by mouth daily.    [provider]  warfarin (COUMADIN) 5 MG tablet Take 5 mg by mouth See admin instructions. 5 mg daily except on Sundays and Thursday 07/23/16   [provider]    Allergies Patient has no known allergies.  Family History  Problem Relation Age of Onset  . Breast cancer Mother 22    Social History Social History   Tobacco Use  . Smoking status: Never Smoker  . Smokeless tobacco: Never Used  Substance Use Topics  . Alcohol use: No  . Drug use: No    Review of Systems  Constitutional: No fever/chills Eyes: No visual changes. ENT: No sore throat. Cardiovascular: As above Respiratory: As above Gastrointestinal: No abdominal pain.  No diarrhea.  No constipation. Genitourinary: Negative for dysuria. Musculoskeletal: Negative for back pain. Skin: Negative  for rash. Neurological: Negative for headaches, focal weakness or numbness.   ____________________________________________   PHYSICAL EXAM:  VITAL SIGNS: ED Triage Vitals  Enc Vitals Group     BP 12/20/17 2133 124/78     Pulse Rate 12/20/17 2133 73     Resp 12/20/17 2133 15     Temp 12/20/17 2133 98.1 F (36.7 C)     Temp Source 12/20/17 2133 Oral     SpO2 12/20/17 2133 (!) 87 %     Weight 12/20/17 2131 200 lb (90.7 kg)     Height 12/20/17 2131 5\' 6"  (1.676 m)     Head Circumference --      Peak Flow --      Pain Score 12/20/17 2130 0     Pain Loc --      Pain Edu? --      Excl. in GC? --     Constitutional: Alert and oriented. Well appearing and in no acute distress. Eyes: Conjunctivae are normal.  Head: Atraumatic. Nose: No congestion/rhinnorhea. Mouth/Throat: Mucous membranes are moist.  Neck: No stridor.   Cardiovascular: Normal rate, regular rhythm. Grossly normal heart sounds.  Respiratory: Normal respiratory effort.  No retractions.  Mild rales to the bilateral bases.  Patient now 97% on room air. Gastrointestinal: Soft and nontender. No distention. No CVA tenderness. Musculoskeletal: No lower extremity tenderness nor edema.  No joint effusions. Neurologic:  Normal speech and language. No gross focal neurologic deficits are appreciated. Skin:  Skin is warm, dry and intact. No rash noted. Psychiatric: Mood and affect are normal. Speech and behavior are normal.  ____________________________________________   LABS (all labs ordered are listed, but only abnormal results are displayed)  Labs Reviewed  CBC WITH DIFFERENTIAL/PLATELET - Abnormal; Notable for the following components:      Result Value   Hemoglobin 11.5 (*)    MCH 25.4 (*)    MCHC 31.4 (*)    RDW 16.9 (*)    Neutro Abs 6.6 (*)    All other components within normal limits  BASIC METABOLIC PANEL - Abnormal; Notable for the following components:   Glucose, Bld 202 (*)    BUN 22 (*)    GFR calc  non Af Amer 57 (*)    All other components within normal limits  TROPONIN I - Abnormal; Notable for the following components:   Troponin I 0.06 (*)    All other components within normal limits  BRAIN NATRIURETIC PEPTIDE - Abnormal; Notable for the following components:   B Natriuretic Peptide 155.0 (*)    All other components within normal limits  PROTIME-INR   ____________________________________________  EKG  ED ECG REPORT I, Arelia Longest, the attending physician, personally viewed and interpreted this ECG.   Date: 12/20/2017  EKG Time: 2133  Rate: 66  Rhythm: normal sinus rhythm  Axis: Normal  Intervals:none  ST&T Change: T wave inversions in V2.  Otherwise no ST elevations or depressions.  No other T wave inversions.  ____________________________________________  RADIOLOGY  Increased density in the right apex which may represent underlying infiltrate ____________________________________________   PROCEDURES  Procedure(s) performed:   Procedures  Critical Care performed:   ____________________________________________   INITIAL IMPRESSION / ASSESSMENT AND PLAN / ED COURSE  Pertinent labs & imaging results that were available during my care of the patient were reviewed by me and considered in my medical decision making (see chart for details).  Differential diagnosis includes, but is not limited to, ACS, aortic dissection, pulmonary embolism, cardiac tamponade, pneumothorax, pneumonia, pericarditis, myocarditis, GI-related causes including esophagitis/gastritis, and musculoskeletal chest wall pain.   As part of my medical decision making, I reviewed the following data within the electronic MEDICAL RECORD NUMBER Notes from prior ED visits  ----------------------------------------- 10:50 PM on 12/20/2017 -----------------------------------------  Patient remains pain-free at this time.  However, her troponin is slightly elevated.  Very concerning story for  unstable angina which may now be an STEMI because of the slightly elevated troponin.  She will require troponin trending.  I will not start heparin at this time because only very slightly elevated troponin level.  Signed out to Dr. Stacie Acres.  Patient as well as family understanding the treatment plan willing to comply. ____________________________________________   FINAL CLINICAL IMPRESSION(S) / ED DIAGNOSES  Chest pain.    NEW MEDICATIONS STARTED DURING THIS VISIT:  New Prescriptions   No medications on file     Note:  This document was prepared using Dragon voice recognition software and may include unintentional dictation errors.     Myrna Blazer, MD 12/20/17 2250

## 2017-12-21 ENCOUNTER — Encounter
Admission: EM | Disposition: A | Discharge status: Other Acute Inpatient Hospital | Payer: Self-pay | Source: Home / Self Care | Attending: Family Medicine

## 2017-12-21 ENCOUNTER — Encounter: Payer: Self-pay | Admitting: *Deleted

## 2017-12-21 DIAGNOSIS — R079 Chest pain, unspecified: Secondary | ICD-10-CM | POA: Diagnosis present

## 2017-12-21 DIAGNOSIS — I214 Non-ST elevation (NSTEMI) myocardial infarction: Secondary | ICD-10-CM | POA: Diagnosis present

## 2017-12-21 DIAGNOSIS — Z86718 Personal history of other venous thrombosis and embolism: Secondary | ICD-10-CM | POA: Diagnosis not present

## 2017-12-21 DIAGNOSIS — D649 Anemia, unspecified: Secondary | ICD-10-CM | POA: Diagnosis present

## 2017-12-21 DIAGNOSIS — Z86711 Personal history of pulmonary embolism: Secondary | ICD-10-CM | POA: Diagnosis not present

## 2017-12-21 DIAGNOSIS — Z6832 Body mass index (BMI) 32.0-32.9, adult: Secondary | ICD-10-CM | POA: Diagnosis not present

## 2017-12-21 DIAGNOSIS — Z803 Family history of malignant neoplasm of breast: Secondary | ICD-10-CM | POA: Diagnosis not present

## 2017-12-21 DIAGNOSIS — Z7901 Long term (current) use of anticoagulants: Secondary | ICD-10-CM | POA: Diagnosis not present

## 2017-12-21 DIAGNOSIS — E669 Obesity, unspecified: Secondary | ICD-10-CM | POA: Diagnosis present

## 2017-12-21 DIAGNOSIS — Z79899 Other long term (current) drug therapy: Secondary | ICD-10-CM | POA: Diagnosis not present

## 2017-12-21 DIAGNOSIS — I10 Essential (primary) hypertension: Secondary | ICD-10-CM | POA: Diagnosis present

## 2017-12-21 DIAGNOSIS — I25110 Atherosclerotic heart disease of native coronary artery with unstable angina pectoris: Secondary | ICD-10-CM | POA: Diagnosis present

## 2017-12-21 DIAGNOSIS — F329 Major depressive disorder, single episode, unspecified: Secondary | ICD-10-CM | POA: Diagnosis present

## 2017-12-21 DIAGNOSIS — G4733 Obstructive sleep apnea (adult) (pediatric): Secondary | ICD-10-CM | POA: Diagnosis present

## 2017-12-21 HISTORY — PX: LEFT HEART CATH AND CORONARY ANGIOGRAPHY: CATH118249

## 2017-12-21 LAB — BASIC METABOLIC PANEL
Anion gap: 5 (ref 5–15)
BUN: 22 mg/dL — AB (ref 6–20)
CALCIUM: 9 mg/dL (ref 8.9–10.3)
CO2: 26 mmol/L (ref 22–32)
CREATININE: 0.81 mg/dL (ref 0.44–1.00)
Chloride: 110 mmol/L (ref 101–111)
GFR calc Af Amer: >60 mL/min (ref >60)
GLUCOSE: 91 mg/dL (ref 65–99)
Potassium: 4.3 mmol/L (ref 3.5–5.1)
SODIUM: 141 mmol/L (ref 135–145)

## 2017-12-21 LAB — CBC
HEMATOCRIT: 34.3 % — AB (ref 35.0–47.0)
Hemoglobin: 10.9 g/dL — ABNORMAL LOW (ref 12.0–16.0)
MCH: 25.6 pg — ABNORMAL LOW (ref 26.0–34.0)
MCHC: 31.9 g/dL — AB (ref 32.0–36.0)
MCV: 80.2 fL (ref 80.0–100.0)
PLATELETS: 201 10*3/uL (ref 150–440)
RBC: 4.27 MIL/uL (ref 3.80–5.20)
RDW: 16.9 % — AB (ref 11.5–14.5)
WBC: 8.7 10*3/uL (ref 3.6–11.0)

## 2017-12-21 LAB — PROTIME-INR
INR: 1.21
PROTHROMBIN TIME: 15.2 s (ref 11.4–15.2)

## 2017-12-21 LAB — CARDIAC CATHETERIZATION: Cath EF Quantitative: 60 %

## 2017-12-21 LAB — GLUCOSE, CAPILLARY: GLUCOSE-CAPILLARY: 82 mg/dL (ref 65–99)

## 2017-12-21 LAB — TROPONIN I
TROPONIN I: 2.38 ng/mL — AB (ref <0.03)
TROPONIN I: 3.76 ng/mL — AB (ref <0.03)
TROPONIN I: 2.02 ng/mL — AB (ref <0.03)

## 2017-12-21 SURGERY — LEFT HEART CATH AND CORONARY ANGIOGRAPHY
Anesthesia: Moderate Sedation

## 2017-12-21 MED ORDER — ASPIRIN EC 81 MG PO TBEC
81.0000 mg | DELAYED_RELEASE_TABLET | Freq: Every day | ORAL | Status: DC
  Administered 2017-12-21: 81 mg via ORAL
  Filled 2017-12-21: qty 1

## 2017-12-21 MED ORDER — IOPAMIDOL (ISOVUE-300) INJECTION 61%
INTRAVENOUS | Status: DC | PRN
  Administered 2017-12-21: 100 mL via INTRA_ARTERIAL

## 2017-12-21 MED ORDER — ASPIRIN 81 MG PO CHEW: 81.0000 mg | CHEWABLE_TABLET | ORAL | Status: DC

## 2017-12-21 MED ORDER — LISINOPRIL 5 MG PO TABS
5.0000 mg | ORAL_TABLET | Freq: Every day | ORAL | Status: DC
  Administered 2017-12-21 – 2017-12-22 (×2): 5 mg via ORAL
  Filled 2017-12-21 (×2): qty 1

## 2017-12-21 MED ORDER — MIDAZOLAM HCL 2 MG/2ML IJ SOLN
INTRAMUSCULAR | Status: AC
  Filled 2017-12-21: qty 2

## 2017-12-21 MED ORDER — HEPARIN (PORCINE) IN NACL 2-0.9 UNIT/ML-% IJ SOLN
INTRAMUSCULAR | Status: AC
  Filled 2017-12-21: qty 1000

## 2017-12-21 MED ORDER — WARFARIN SODIUM 5 MG PO TABS
5.0000 mg | ORAL_TABLET | ORAL | Status: DC
  Administered 2017-12-21: 5 mg via ORAL
  Filled 2017-12-21: qty 1

## 2017-12-21 MED ORDER — ACETAMINOPHEN 325 MG PO TABS
650.0000 mg | ORAL_TABLET | Freq: Four times a day (QID) | ORAL | Status: DC | PRN
  Administered 2017-12-21: 325 mg via ORAL
  Administered 2017-12-21: 650 mg via ORAL
  Filled 2017-12-21: qty 2

## 2017-12-21 MED ORDER — ASPIRIN 81 MG PO CHEW
81.0000 mg | CHEWABLE_TABLET | Freq: Every day | ORAL | Status: DC
  Administered 2017-12-22: 81 mg via ORAL
  Filled 2017-12-21: qty 1

## 2017-12-21 MED ORDER — HYDROCODONE-ACETAMINOPHEN 5-325 MG PO TABS: 1.0000 | ORAL_TABLET | ORAL | Status: DC | PRN

## 2017-12-21 MED ORDER — LORATADINE 10 MG PO TABS
10.0000 mg | ORAL_TABLET | Freq: Every day | ORAL | Status: DC
  Administered 2017-12-21 – 2017-12-22 (×2): 10 mg via ORAL
  Filled 2017-12-21 (×2): qty 1

## 2017-12-21 MED ORDER — FENTANYL CITRATE (PF) 100 MCG/2ML IJ SOLN
INTRAMUSCULAR | Status: DC | PRN
  Administered 2017-12-21: 25 ug via INTRAVENOUS

## 2017-12-21 MED ORDER — SODIUM CHLORIDE 0.9% FLUSH
3.0000 mL | Freq: Two times a day (BID) | INTRAVENOUS | Status: DC
  Administered 2017-12-21: 3 mL via INTRAVENOUS

## 2017-12-21 MED ORDER — VITAMIN B-12 1000 MCG PO TABS
2500.0000 ug | ORAL_TABLET | Freq: Every day | ORAL | Status: DC
  Administered 2017-12-21 – 2017-12-22 (×2): 2500 ug via ORAL
  Filled 2017-12-21 (×2): qty 3

## 2017-12-21 MED ORDER — SODIUM CHLORIDE 0.9 % WEIGHT BASED INFUSION: 1.0000 mL/kg/h | INTRAVENOUS | Status: AC

## 2017-12-21 MED ORDER — SODIUM CHLORIDE 0.9% FLUSH: 3.0000 mL | Freq: Two times a day (BID) | INTRAVENOUS | Status: DC

## 2017-12-21 MED ORDER — MIDAZOLAM HCL 2 MG/2ML IJ SOLN
INTRAMUSCULAR | Status: DC | PRN
  Administered 2017-12-21: 1 mg via INTRAVENOUS

## 2017-12-21 MED ORDER — VITAMIN C 500 MG PO TABS
500.0000 mg | ORAL_TABLET | Freq: Every day | ORAL | Status: DC
  Administered 2017-12-21: 500 mg via ORAL
  Filled 2017-12-21 (×2): qty 1

## 2017-12-21 MED ORDER — BISACODYL 5 MG PO TBEC: 5.0000 mg | DELAYED_RELEASE_TABLET | Freq: Every day | ORAL | Status: DC | PRN

## 2017-12-21 MED ORDER — HEPARIN (PORCINE) IN NACL 100-0.45 UNIT/ML-% IJ SOLN: 10.0000 [IU]/kg/h | INTRAMUSCULAR | Status: DC

## 2017-12-21 MED ORDER — WARFARIN - PHARMACIST DOSING INPATIENT: Freq: Every day | Status: DC

## 2017-12-21 MED ORDER — SODIUM CHLORIDE 0.9 % WEIGHT BASED INFUSION: 3.0000 mL/kg/h | INTRAVENOUS | Status: DC

## 2017-12-21 MED ORDER — FENTANYL CITRATE (PF) 100 MCG/2ML IJ SOLN
INTRAMUSCULAR | Status: AC
  Filled 2017-12-21: qty 2

## 2017-12-21 MED ORDER — OCUVITE-LUTEIN PO CAPS
1.0000 | ORAL_CAPSULE | Freq: Two times a day (BID) | ORAL | Status: DC
  Administered 2017-12-21 (×2): 1 via ORAL
  Filled 2017-12-21 (×5): qty 1

## 2017-12-21 MED ORDER — ACETAMINOPHEN 325 MG PO TABS: 650.0000 mg | ORAL_TABLET | ORAL | Status: DC | PRN

## 2017-12-21 MED ORDER — SODIUM CHLORIDE 0.9 % IV SOLN: 250.0000 mL | INTRAVENOUS | Status: DC | PRN

## 2017-12-21 MED ORDER — NAPHAZOLINE-PHENIRAMINE 0.027-0.315 % OP SOLN: Freq: Three times a day (TID) | OPHTHALMIC | Status: DC | PRN

## 2017-12-21 MED ORDER — SODIUM CHLORIDE 0.9% FLUSH: 3.0000 mL | INTRAVENOUS | Status: DC | PRN

## 2017-12-21 MED ORDER — SERTRALINE HCL 50 MG PO TABS
100.0000 mg | ORAL_TABLET | Freq: Every day | ORAL | Status: DC
  Administered 2017-12-21 – 2017-12-22 (×2): 100 mg via ORAL
  Filled 2017-12-21 (×2): qty 2

## 2017-12-21 MED ORDER — SODIUM CHLORIDE 0.9% FLUSH
3.0000 mL | INTRAVENOUS | Status: DC | PRN
Start: 2017-12-22 — End: 2017-12-22

## 2017-12-21 MED ORDER — ONDANSETRON HCL 4 MG/2ML IJ SOLN
4.0000 mg | Freq: Four times a day (QID) | INTRAMUSCULAR | Status: DC | PRN
  Administered 2017-12-21: 4 mg via INTRAVENOUS
  Filled 2017-12-21: qty 2

## 2017-12-21 MED ORDER — WARFARIN SODIUM 5 MG PO TABS: 5.0000 mg | ORAL_TABLET | ORAL | Status: DC

## 2017-12-21 MED ORDER — SODIUM CHLORIDE 0.9 % WEIGHT BASED INFUSION: 1.0000 mL/kg/h | INTRAVENOUS | Status: DC

## 2017-12-21 MED ORDER — TRAZODONE HCL 50 MG PO TABS
25.0000 mg | ORAL_TABLET | Freq: Every evening | ORAL | Status: DC | PRN
Start: 2017-12-21 — End: 2017-12-22
  Administered 2017-12-21: 25 mg via ORAL
  Filled 2017-12-21: qty 1

## 2017-12-21 MED ORDER — ACETAMINOPHEN 325 MG PO TABS
ORAL_TABLET | ORAL | Status: AC
  Filled 2017-12-21: qty 2

## 2017-12-21 MED ORDER — FERROUS SULFATE 325 (65 FE) MG PO TABS
325.0000 mg | ORAL_TABLET | Freq: Every day | ORAL | Status: DC
  Administered 2017-12-21: 325 mg via ORAL
  Filled 2017-12-21 (×2): qty 1

## 2017-12-21 MED ORDER — CYANOCOBALAMIN 2500 MCG SL SUBL: 2500.0000 ug | SUBLINGUAL_TABLET | Freq: Every day | SUBLINGUAL | Status: DC

## 2017-12-21 MED ORDER — ACETAMINOPHEN 650 MG RE SUPP: 650.0000 mg | Freq: Four times a day (QID) | RECTAL | Status: DC | PRN

## 2017-12-21 MED ORDER — HEPARIN (PORCINE) IN NACL 100-0.45 UNIT/ML-% IJ SOLN
1250.0000 [IU]/h | INTRAMUSCULAR | Status: DC
  Filled 2017-12-21: qty 250

## 2017-12-21 MED ORDER — HEPARIN BOLUS VIA INFUSION
4000.0000 [IU] | Freq: Once | INTRAVENOUS | Status: AC
  Administered 2017-12-21: 4000 [IU] via INTRAVENOUS
  Filled 2017-12-21: qty 4000

## 2017-12-21 MED ORDER — DOCUSATE SODIUM 100 MG PO CAPS
100.0000 mg | ORAL_CAPSULE | Freq: Two times a day (BID) | ORAL | Status: DC
Start: 2017-12-21 — End: 2017-12-22
  Administered 2017-12-21 (×2): 100 mg via ORAL
  Filled 2017-12-21 (×3): qty 1

## 2017-12-21 MED ORDER — ONDANSETRON HCL 4 MG PO TABS: 4.0000 mg | ORAL_TABLET | Freq: Four times a day (QID) | ORAL | Status: DC | PRN

## 2017-12-21 MED ORDER — ONDANSETRON HCL 4 MG/2ML IJ SOLN: 4.0000 mg | Freq: Four times a day (QID) | INTRAMUSCULAR | Status: DC | PRN

## 2017-12-21 MED ORDER — SODIUM CHLORIDE 0.9 % IV SOLN
Freq: Once | INTRAVENOUS | Status: AC
  Administered 2017-12-21 (×2): via INTRAVENOUS

## 2017-12-21 MED ORDER — HEPARIN (PORCINE) IN NACL 100-0.45 UNIT/ML-% IJ SOLN
950.0000 [IU]/h | INTRAMUSCULAR | Status: DC
  Administered 2017-12-21: 950 [IU]/h via INTRAVENOUS
  Filled 2017-12-21: qty 250

## 2017-12-21 SURGICAL SUPPLY — 10 items
CATH INFINITI 5FR ANG PIGTAIL (CATHETERS) ×3 IMPLANT
CATH INFINITI 5FR JL4 (CATHETERS) ×3 IMPLANT
CATH INFINITI 5FR JL5 (CATHETERS) ×3 IMPLANT
CATH INFINITI JR4 5F (CATHETERS) ×3 IMPLANT
DEVICE CLOSURE MYNXGRIP 5F (Vascular Products) ×3 IMPLANT
KIT MANI 3VAL PERCEP (MISCELLANEOUS) ×3 IMPLANT
NEEDLE PERC 18GX7CM (NEEDLE) ×3 IMPLANT
PACK CARDIAC CATH (CUSTOM PROCEDURE TRAY) ×3 IMPLANT
SHEATH AVANTI 5FR X 11CM (SHEATH) ×3 IMPLANT
WIRE GUIDERIGHT .035X150 (WIRE) ×3 IMPLANT

## 2017-12-21 NOTE — Progress Notes (Signed)
ANTICOAGULATION CONSULT NOTE - Initial Consult  Pharmacy Consult for heparin drip Indication: chest pain/ACS  No Known Allergies  Patient Measurements: Height: 5\' 6"  (167.6 cm) Weight: 206 lb 1.6 oz (93.5 kg) IBW/kg (Calculated) : 59.3 Heparin Dosing Weight: 80 kg  Vital Signs: Temp: 97.7 F (36.5 C) (04/03 1645) Temp Source: Oral (04/03 1645) BP: 135/65 (04/03 1645) Pulse Rate: 67 (04/03 1645)  Labs: Recent Labs    12/20/17 2134 12/20/17 2135 12/21/17 0156 12/21/17 0501 12/21/17 0901  HGB 11.5*  --   --  10.9*  --   HCT 36.6  --   --  34.3*  --   PLT 203  --   --  201  --   LABPROT  --  13.9  --  15.2  --   INR  --  1.08  --  1.21  --   CREATININE 0.87  --   --  0.81  --   TROPONINI 0.06*  --  2.38*  --  3.76*    Estimated Creatinine Clearance: 54.3 mL/min (by C-G formula based on SCr of 0.81 mg/dL).   Medical History: Past Medical History:  Diagnosis Date  . Anginal pain (HCC)   . Dyspnea   . History of blood clots   . Hypertension   . PE (pulmonary embolism)   . Sleep apnea     Medications:  On warfarin as outpatient 5 mg daily except Sunday and Thursday.  Assessment: INR subtherapeutic on admission  Goal of Therapy:  INR 2-3  Heparin level 0.3-0.7. Monitor platelets by anticoagulation protocol: Yes   Plan:  Heparin 4000 unit bolus and initial rate of 950 units/hr. First heparin level 8 hours after start of infusion.  Continue home warfarin regimen as above.   4/3 1530 Patient now post Cath- According to nurse Cardiology gave instructions to restart heparin drip at 1900. [facility]. Will check HL 8 hours after heparin drip start time.   Gardner CandleSheema M Yolonda Purtle, PharmD, BCPS Clinical Pharmacist 12/21/2017 5:31 PM

## 2017-12-21 NOTE — Progress Notes (Signed)
[employer] [facility] [place] [facility]   PATIENT NAME: Alison Russo    MR#:  [id number]  DATE OF BIRTH:  12-05-1925  SUBJECTIVE:  CHIEF COMPLAINT:   Chief Complaint  Patient presents with  . Chest Pain  Patient complains of headache only, family at the bedside, for heart catheterization for further evaluation  REVIEW OF SYSTEMS:  CONSTITUTIONAL: No fever, fatigue or weakness.  EYES: No blurred or double vision.  EARS, NOSE, AND THROAT: No tinnitus or ear pain.  RESPIRATORY: No cough, shortness of breath, wheezing or hemoptysis.  CARDIOVASCULAR: No chest pain, orthopnea, edema.  GASTROINTESTINAL: No nausea, vomiting, diarrhea or abdominal pain.  GENITOURINARY: No dysuria, hematuria.  ENDOCRINE: No polyuria, nocturia,  HEMATOLOGY: No anemia, easy bruising or bleeding SKIN: No rash or lesion. MUSCULOSKELETAL: No joint pain or arthritis.   NEUROLOGIC: No tingling, numbness, weakness.  PSYCHIATRY: No anxiety or depression.   ROS  DRUG ALLERGIES:  No Known Allergies  VITALS:  Blood pressure (!) 160/93, pulse 66, temperature 97.6 F (36.4 C), temperature source Oral, resp. rate (!) 23, height 5\' 6"  (1.676 m), weight 93.5 kg (206 lb 1.6 oz), SpO2 95 %.  PHYSICAL EXAMINATION:  GENERAL:  84 y.o.-year-old patient lying in the bed with no acute distress.  EYES: Pupils equal, round, reactive to light and accommodation. No scleral icterus. Extraocular muscles intact.  HEENT: Head atraumatic, normocephalic. Oropharynx and nasopharynx clear.  NECK:  Supple, no jugular venous distention. No thyroid enlargement, no tenderness.  LUNGS: Normal breath sounds bilaterally, no wheezing, rales,rhonchi or crepitation. No use of accessory muscles of respiration.  CARDIOVASCULAR: S1, S2 normal. No murmurs, rubs, or gallops.  ABDOMEN: Soft, nontender, nondistended. Bowel sounds present. No organomegaly or mass.  EXTREMITIES: No pedal edema, cyanosis, or clubbing.  NEUROLOGIC: Cranial  nerves II through XII are intact. Muscle strength 5/5 in all extremities. Sensation intact. Gait not checked.  PSYCHIATRIC: The patient is alert and oriented x 3.  SKIN: No obvious rash, lesion, or ulcer.   Physical Exam LABORATORY PANEL:   CBC Recent Labs  Lab 12/21/17 0501  WBC 8.7  HGB 10.9*  HCT 34.3*  PLT 201   ------------------------------------------------------------------------------------------------------------------  Chemistries  Recent Labs  Lab 12/21/17 0501  NA 141  K 4.3  CL 110  CO2 26  GLUCOSE 91  BUN 22*  CREATININE 0.81  CALCIUM 9.0   ------------------------------------------------------------------------------------------------------------------  Cardiac Enzymes Recent Labs  Lab 12/21/17 0156 12/21/17 0901  TROPONINI 2.38* 3.76*   ------------------------------------------------------------------------------------------------------------------  RADIOLOGY:  Dg Chest 1 View  Result Date: 12/20/2017 CLINICAL DATA:  Chest pain EXAM: CHEST  1 VIEW COMPARISON:  05/23/2007 FINDINGS: Cardiac shadow is mildly enlarged. Aortic calcifications are again seen. Some increased density is noted in the medial aspect of the right apex increased from the prior exam. Previously these changes were related to prominent mediastinal soft tissues it would be difficult to exclude an underlying infiltrate. Mild interstitial changes of a chronic nature are seen. No focal infiltrate or sizable effusion is noted. IMPRESSION: Increased density in the right apex which may represent an underlying infiltrate. Electronically Signed   By: Alcide CleverMark  Lukens M.D.   On: 12/20/2017 21:56    ASSESSMENT AND PLAN:  1.  acute NSTEMI Stable  Continue heparin drip as bridge to Coumadin-subtherapeutic on Coumadin on admission, aspirin, lisinopril, AV nodal blocking agent avoided given mild bradycardia, cardiology following-for heart catheterization later today for further evaluation    2.history of coronary artery disease  Plan of care as stated  above  3. history of pulmonary embolism/DVT Chronically on Coumadin at home-INR subtherapeutic on admission  Continue heparin bridge to Coumadin-pharmacy to dose  4.  chronic benign essential hypertension controlled on current regimen  All the records are reviewed and case discussed with Care Management/Social Workerr. Management plans discussed with the patient, family and they are in agreement.  CODE STATUS: full  TOTAL TIME TAKING CARE OF THIS PATIENT: 35 minutes.     POSSIBLE D/C IN 1-2 DAYS, DEPENDING ON CLINICAL CONDITION.   Evelena Asa Salary M.D on 12/21/2017   Between 7am to 6pm - Pager - [phone]  After 6pm go to [email or web address] - password [email or web address] [facility]  [facility]  Office  [phone]  CC: Primary care physician; Lyndon Code, MD  Note: This dictation was prepared with Dragon dictation along with smaller phrase technology. Any transcriptional errors that result from this process are unintentional.

## 2017-12-21 NOTE — ED Notes (Signed)
Patient is resting comfortably. 

## 2017-12-21 NOTE — Progress Notes (Signed)
ANTICOAGULATION CONSULT NOTE - Initial Consult  Pharmacy Consult for heparin drip Indication: chest pain/ACS  No Known Allergies  Patient Measurements: Height: 5\' 6"  (167.6 cm) Weight: 206 lb 1.6 oz (93.5 kg) IBW/kg (Calculated) : 59.3 Heparin Dosing Weight: 80 kg  Vital Signs: Temp: 97.4 F (36.3 C) (04/03 0236) Temp Source: Oral (04/03 0236) BP: 153/91 (04/03 0236) Pulse Rate: 63 (04/03 0236)  Labs: Recent Labs    12/20/17 2134 12/20/17 2135 12/21/17 0156  HGB 11.5*  --   --   HCT 36.6  --   --   PLT 203  --   --   LABPROT  --  13.9  --   INR  --  1.08  --   CREATININE 0.87  --   --   TROPONINI 0.06*  --  2.38*    Estimated Creatinine Clearance: 50.5 mL/min (by C-G formula based on SCr of 0.87 mg/dL).   Medical History: Past Medical History:  Diagnosis Date  . Anginal pain (HCC)   . Dyspnea   . History of blood clots   . Hypertension   . PE (pulmonary embolism)   . Sleep apnea     Medications:  On warfarin as outpatient 5 mg daily except Sunday and Thursday.  Assessment: INR subtherapeutic on admission  Goal of Therapy:  INR 2-3  Heparin level 0.3-0.7. Monitor platelets by anticoagulation protocol: Yes   Plan:  Heparin 4000 unit bolus and initial rate of 950 units/hr. First heparin level 8 hours after start of infusion.  Continue home warfarin regimen as above.   Franchelle Foskett S 12/21/2017,4:10 AM

## 2017-12-22 ENCOUNTER — Inpatient Hospital Stay (HOSPITAL_COMMUNITY)
Admission: AD | Admit: 2017-12-22 | Discharge: 2017-12-26 | DRG: 247 | Disposition: A | Discharge status: Home / Self Care | Payer: PPO | Source: Other Acute Inpatient Hospital | Attending: Internal Medicine | Admitting: Internal Medicine

## 2017-12-22 DIAGNOSIS — D62 Acute posthemorrhagic anemia: Secondary | ICD-10-CM | POA: Diagnosis not present

## 2017-12-22 DIAGNOSIS — E782 Mixed hyperlipidemia: Secondary | ICD-10-CM

## 2017-12-22 DIAGNOSIS — Z86711 Personal history of pulmonary embolism: Secondary | ICD-10-CM

## 2017-12-22 DIAGNOSIS — I2510 Atherosclerotic heart disease of native coronary artery without angina pectoris: Secondary | ICD-10-CM

## 2017-12-22 DIAGNOSIS — Z803 Family history of malignant neoplasm of breast: Secondary | ICD-10-CM | POA: Diagnosis not present

## 2017-12-22 DIAGNOSIS — Z6832 Body mass index (BMI) 32.0-32.9, adult: Secondary | ICD-10-CM

## 2017-12-22 DIAGNOSIS — I480 Paroxysmal atrial fibrillation: Secondary | ICD-10-CM | POA: Diagnosis present

## 2017-12-22 DIAGNOSIS — G4733 Obstructive sleep apnea (adult) (pediatric): Secondary | ICD-10-CM | POA: Diagnosis present

## 2017-12-22 DIAGNOSIS — Z7901 Long term (current) use of anticoagulants: Secondary | ICD-10-CM | POA: Insufficient documentation

## 2017-12-22 DIAGNOSIS — E785 Hyperlipidemia, unspecified: Secondary | ICD-10-CM | POA: Diagnosis not present

## 2017-12-22 DIAGNOSIS — E669 Obesity, unspecified: Secondary | ICD-10-CM | POA: Diagnosis present

## 2017-12-22 DIAGNOSIS — R1030 Lower abdominal pain, unspecified: Secondary | ICD-10-CM | POA: Diagnosis not present

## 2017-12-22 DIAGNOSIS — I214 Non-ST elevation (NSTEMI) myocardial infarction: Secondary | ICD-10-CM | POA: Diagnosis not present

## 2017-12-22 DIAGNOSIS — Y838 Other surgical procedures as the cause of abnormal reaction of the patient, or of later complication, without mention of misadventure at the time of the procedure: Secondary | ICD-10-CM | POA: Diagnosis not present

## 2017-12-22 DIAGNOSIS — I340 Nonrheumatic mitral (valve) insufficiency: Secondary | ICD-10-CM | POA: Diagnosis not present

## 2017-12-22 DIAGNOSIS — Z7982 Long term (current) use of aspirin: Secondary | ICD-10-CM | POA: Diagnosis not present

## 2017-12-22 DIAGNOSIS — I10 Essential (primary) hypertension: Secondary | ICD-10-CM | POA: Diagnosis not present

## 2017-12-22 DIAGNOSIS — I97638 Postprocedural hematoma of a circulatory system organ or structure following other circulatory system procedure: Secondary | ICD-10-CM | POA: Diagnosis not present

## 2017-12-22 DIAGNOSIS — D649 Anemia, unspecified: Secondary | ICD-10-CM | POA: Diagnosis not present

## 2017-12-22 DIAGNOSIS — I200 Unstable angina: Secondary | ICD-10-CM | POA: Diagnosis not present

## 2017-12-22 DIAGNOSIS — I25110 Atherosclerotic heart disease of native coronary artery with unstable angina pectoris: Secondary | ICD-10-CM | POA: Diagnosis present

## 2017-12-22 DIAGNOSIS — Z955 Presence of coronary angioplasty implant and graft: Secondary | ICD-10-CM

## 2017-12-22 DIAGNOSIS — L7632 Postprocedural hematoma of skin and subcutaneous tissue following other procedure: Secondary | ICD-10-CM | POA: Diagnosis not present

## 2017-12-22 DIAGNOSIS — E7849 Other hyperlipidemia: Secondary | ICD-10-CM | POA: Diagnosis not present

## 2017-12-22 HISTORY — DX: Non-ST elevation (NSTEMI) myocardial infarction: I21.4

## 2017-12-22 HISTORY — DX: Dependence on other enabling machines and devices: Z99.89

## 2017-12-22 HISTORY — DX: Cardiac murmur, unspecified: R01.1

## 2017-12-22 HISTORY — DX: Obstructive sleep apnea (adult) (pediatric): G47.33

## 2017-12-22 LAB — PROTIME-INR
INR: 1.37
Prothrombin Time: 16.8 seconds — ABNORMAL HIGH (ref 11.4–15.2)

## 2017-12-22 LAB — CBC
HEMATOCRIT: 36 % (ref 35.0–47.0)
HEMOGLOBIN: 11.5 g/dL — AB (ref 12.0–16.0)
MCH: 25.6 pg — ABNORMAL LOW (ref 26.0–34.0)
MCHC: 31.9 g/dL — AB (ref 32.0–36.0)
MCV: 80.1 fL (ref 80.0–100.0)
Platelets: 187 10*3/uL (ref 150–440)
RBC: 4.5 MIL/uL (ref 3.80–5.20)
RDW: 16.9 % — AB (ref 11.5–14.5)
WBC: 7.6 10*3/uL (ref 3.6–11.0)

## 2017-12-22 LAB — HEPARIN LEVEL (UNFRACTIONATED)
HEPARIN UNFRACTIONATED: 0.12 [IU]/mL — AB (ref 0.30–0.70)
HEPARIN UNFRACTIONATED: 0.5 [IU]/mL (ref 0.30–0.70)

## 2017-12-22 LAB — GLUCOSE, CAPILLARY: Glucose-Capillary: 101 mg/dL — ABNORMAL HIGH (ref 65–99)

## 2017-12-22 MED ORDER — HEPARIN (PORCINE) IN NACL 100-0.45 UNIT/ML-% IJ SOLN
1250.0000 [IU]/h | INTRAMUSCULAR | Status: DC
  Administered 2017-12-22: 1250 [IU]/h via INTRAVENOUS
  Filled 2017-12-22: qty 250

## 2017-12-22 MED ORDER — SODIUM CHLORIDE 0.9 % WEIGHT BASED INFUSION
3.0000 mL/kg/h | INTRAVENOUS | Status: DC
  Administered 2017-12-23: 3 mL/kg/h via INTRAVENOUS

## 2017-12-22 MED ORDER — ALPRAZOLAM 0.5 MG PO TABS
0.5000 mg | ORAL_TABLET | Freq: Three times a day (TID) | ORAL | Status: DC | PRN
  Administered 2017-12-22: 0.5 mg via ORAL
  Filled 2017-12-22: qty 1

## 2017-12-22 MED ORDER — ASPIRIN EC 81 MG PO TBEC
81.0000 mg | DELAYED_RELEASE_TABLET | Freq: Every day | ORAL | Status: DC
  Administered 2017-12-23 – 2017-12-26 (×4): 81 mg via ORAL
  Filled 2017-12-22 (×4): qty 1

## 2017-12-22 MED ORDER — METOPROLOL TARTRATE 12.5 MG HALF TABLET
12.5000 mg | ORAL_TABLET | Freq: Two times a day (BID) | ORAL | Status: DC
  Administered 2017-12-22 – 2017-12-26 (×8): 12.5 mg via ORAL
  Filled 2017-12-22 (×8): qty 1

## 2017-12-22 MED ORDER — ATORVASTATIN CALCIUM 80 MG PO TABS
80.0000 mg | ORAL_TABLET | Freq: Every day | ORAL | Status: DC
  Administered 2017-12-22 – 2017-12-25 (×4): 80 mg via ORAL
  Filled 2017-12-22 (×4): qty 1

## 2017-12-22 MED ORDER — SODIUM CHLORIDE 0.9% FLUSH
3.0000 mL | Freq: Two times a day (BID) | INTRAVENOUS | Status: DC
  Administered 2017-12-22 – 2017-12-23 (×2): 3 mL via INTRAVENOUS

## 2017-12-22 MED ORDER — SODIUM CHLORIDE 0.9 % WEIGHT BASED INFUSION: 1.0000 mL/kg/h | INTRAVENOUS | Status: DC

## 2017-12-22 MED ORDER — SODIUM CHLORIDE 0.9% FLUSH: 3.0000 mL | INTRAVENOUS | Status: DC | PRN

## 2017-12-22 MED ORDER — ACETAMINOPHEN 500 MG PO TABS
1000.0000 mg | ORAL_TABLET | Freq: Two times a day (BID) | ORAL | Status: DC | PRN
  Administered 2017-12-24: 1000 mg via ORAL
  Filled 2017-12-22: qty 2

## 2017-12-22 MED ORDER — ASPIRIN 81 MG PO CHEW: 81.0000 mg | CHEWABLE_TABLET | Freq: Every day | ORAL | 0 refills | Status: DC

## 2017-12-22 MED ORDER — HEPARIN (PORCINE) IN NACL 100-0.45 UNIT/ML-% IJ SOLN: 1250.0000 [IU]/h | INTRAMUSCULAR | 0 refills | Status: DC

## 2017-12-22 MED ORDER — SODIUM CHLORIDE 0.9 % IV SOLN: 250.0000 mL | INTRAVENOUS | Status: DC | PRN

## 2017-12-22 MED ORDER — SERTRALINE HCL 100 MG PO TABS
100.0000 mg | ORAL_TABLET | Freq: Every day | ORAL | Status: DC
  Administered 2017-12-23 – 2017-12-26 (×4): 100 mg via ORAL
  Filled 2017-12-22: qty 1
  Filled 2017-12-22: qty 2
  Filled 2017-12-22: qty 1
  Filled 2017-12-22: qty 2

## 2017-12-22 MED ORDER — HEPARIN BOLUS VIA INFUSION
2400.0000 [IU] | Freq: Once | INTRAVENOUS | Status: AC
  Administered 2017-12-22: 2400 [IU] via INTRAVENOUS
  Filled 2017-12-22: qty 2400

## 2017-12-22 MED ORDER — ASPIRIN 81 MG PO CHEW
81.0000 mg | CHEWABLE_TABLET | ORAL | Status: AC
  Administered 2017-12-23: 81 mg via ORAL
  Filled 2017-12-22: qty 1

## 2017-12-22 MED ORDER — NITROGLYCERIN 0.4 MG SL SUBL: 0.4000 mg | SUBLINGUAL_TABLET | SUBLINGUAL | Status: DC | PRN

## 2017-12-22 MED ORDER — FERROUS SULFATE 325 (65 FE) MG PO TABS
325.0000 mg | ORAL_TABLET | Freq: Every day | ORAL | Status: DC
  Administered 2017-12-22 – 2017-12-26 (×5): 325 mg via ORAL
  Filled 2017-12-22 (×6): qty 1

## 2017-12-22 MED ORDER — TRAZODONE HCL 50 MG PO TABS: 25.0000 mg | ORAL_TABLET | Freq: Every evening | ORAL | 0 refills | Status: DC | PRN

## 2017-12-22 NOTE — H&P (Addendum)
Cardiology Admission History and Physical:   Patient ID: Alison Russo; MRN: [id number]; DOB: [birth year]   Admission date: 12/22/2017  Primary Care Provider: Lyndon Code, MD Primary Cardiologist: Dr. Juliann Pares  Chief Complaint:  Chest Pain  Patient Profile:   Alison Russo is a 84 y.o. female with a history of known CAD, followed by Dr. Juliann Pares in [place], being transferred from [facility] to [facility] for high risk PCI. Also with h/o PAF, remote bilateral PE, chronic anticoagulation with Coumadin, HTN and OSA.   History of Present Illness:   Alison Russo is a 84 y.o. female with a history of known CAD, followed by Dr. Juliann Pares in [place], being transferred from [facility] to [facility] for high risk PCI.   Pt initially presented to [facility] with unstable angina and ruled in for NSTEMI with troponin peaking at 2.6. Dr. Juliann Pares performed diagnostic LHC on 12/22/17 and she was found to have a complex lesion in the proximal LAD bifurcation with the diagonal vessel, 99% stenosed. LVEF normal. PCI felt to be high risk, thus recommendations were to transfer pt to a tertiary care center for intervention. Cath is scheduled with Dr. Swaziland on 12/23/17.  Pt has arrived to [facility] and is stable w/o CP. No dyspnea. She is on IV heparin. Daughter is by bedside.    Past Medical History:  Diagnosis Date  . Anginal pain (HCC)   . Dyspnea   . History of blood clots   . Hypertension   . PE (pulmonary embolism)   . Sleep apnea     Past Surgical History:  Procedure Laterality Date  . BREAST CYST ASPIRATION Right    neg  . CARDIAC CATHETERIZATION    . COLONOSCOPY    . HYSTEROSCOPY W/D&C N/A 10/25/2016   Procedure: DILATATION AND CURETTAGE /HYSTEROSCOPY, POLYPECTOMY;  Surgeon: Christeen Douglas, MD;  Location: [facility] ORS;  Service: Gynecology;  Laterality: N/A;  . LEFT HEART CATH AND CORONARY ANGIOGRAPHY N/A 12/21/2017   Procedure: LEFT HEART CATH AND CORONARY ANGIOGRAPHY possible PCI and stent;  Surgeon: Alwyn Pea, MD;  Location: [facility] INVASIVE CV LAB;  Service: Cardiovascular;  Laterality: N/A;  . TONSILLECTOMY       Medications Prior to Admission: Prior to Admission medications   Medication Sig Start Date End Date Taking? Authorizing Provider  acetaminophen (TYLENOL) 500 MG tablet Take 1,000 mg by mouth 2 (two) times daily as needed (for pain/headache/fever).    [provider]  aspirin 81 MG chewable tablet Chew 1 tablet (81 mg total) by mouth daily. 12/23/17   Altamese Dilling, MD  CALCIUM PO Take 1,200 mg by mouth daily.    [provider]  Cyanocobalamin 2500 MCG SUBL Place 2,500 mcg under the tongue daily.    [provider]  ferrous sulfate (FERROUSUL) 325 (65 FE) MG tablet Take 325 mg by mouth daily.    [provider]  heparin 100-0.45 UNIT/ML-% infusion Inject 1,250 Units/hr into the vein continuous. 12/22/17   Altamese Dilling, MD  lisinopril (PRINIVIL,ZESTRIL) 5 MG tablet Take 5 mg by mouth daily. 09/06/16   [provider]  loratadine (CLARITIN) 10 MG tablet Take 10 mg by mouth daily.    [provider]  metoprolol tartrate (LOPRESSOR) 12.5 mg TABS tablet Take 12.5 mg by mouth 2 (two) times daily.    [provider]  metoprolol tartrate (LOPRESSOR) 25 MG tablet Take 12.5 mg by mouth 2 (two) times daily. 10/15/16   [provider]  Multiple Vitamins-Minerals (PRESERVISION AREDS 2+MULTI  VIT PO) Take 1 tablet by mouth 2 (two) times daily.    [provider]  Naphazoline-Pheniramine (OPCON-A OP) Place 1-2 drops into both eyes 3 (three) times daily as needed (for itchy/irritated eyes.).    [provider]  sertraline (ZOLOFT) 100 MG tablet Take 100 mg by mouth daily. 10/15/16   [provider]  traZODone (DESYREL) 50 MG tablet Take 0.5 tablets (25 mg total) by mouth at bedtime as needed for sleep. 12/22/17   Altamese DillingVachhani, Vaibhavkumar, MD  vitamin C (ASCORBIC ACID) 500 MG tablet Take 500 mg by  mouth daily.    [provider]  warfarin (COUMADIN) 5 MG tablet Take 5 mg by mouth See admin instructions. 5 mg daily except on Sundays and Thursday 07/23/16   [provider]     Allergies:   No Known Allergies  Social History:   Social History   Socioeconomic History  . Marital status: Widowed    Spouse name: Not on file  . Number of children: Not on file  . Years of education: Not on file  . Highest education level: Not on file  Occupational History  . Not on file  Social Needs  . Financial resource strain: Not on file  . Food insecurity:    Worry: Not on file    Inability: Not on file  . Transportation needs:    Medical: Not on file    Non-medical: Not on file  Tobacco Use  . Smoking status: Never Smoker  . Smokeless tobacco: Never Used  Substance and Sexual Activity  . Alcohol use: No  . Drug use: No  . Sexual activity: Not Currently  Lifestyle  . Physical activity:    Days per week: Not on file    Minutes per session: Not on file  . Stress: Not on file  Relationships  . Social connections:    Talks on phone: Not on file    Gets together: Not on file    Attends religious service: Not on file    Active member of club or organization: Not on file    Attends meetings of clubs or organizations: Not on file    Relationship status: Not on file  . Intimate partner violence:    Fear of current or ex partner: Not on file    Emotionally abused: Not on file    Physically abused: Not on file    Forced sexual activity: Not on file  Other Topics Concern  . Not on file  Social History Narrative  . Not on file    Family History:   The patient's family history includes Breast cancer (age of onset: 23100) in her mother.    ROS:  Please see the history of present illness.  All other ROS reviewed and negative.     Physical Exam/Data:   Vitals:   12/22/17 1856  BP: (!) 145/76  Pulse: (!) 59  Resp: 18  Temp: 97.6 F (36.4 C)  TempSrc: Oral  SpO2:  95%   No intake or output data in the 24 hours ending 12/22/17 1910 There were no vitals filed for this visit. There is no height or weight on file to calculate BMI.  General:  Well nourished, well developed, in no acute distress, moderately obese  HEENT: normal Lymph: no adenopathy Neck: no JVD Endocrine:  No thryomegaly Vascular: No carotid bruits; FA pulses 2+ bilaterally without bruits  Cardiac:  normal S1, S2; RRR; no murmur  Lungs:  clear to auscultation bilaterally,  no wheezing, rhonchi or rales  Abd: soft, nontender, no hepatomegaly  Ext: trace pretibial edema Musculoskeletal:  No deformities, BUE and BLE strength normal and equal Skin: warm and dry  Neuro:  CNs 2-12 intact, no focal abnormalities noted Psych:  Normal affect    EKG:  The ECG that was done 12/20/17 was personally reviewed and demonstrates SR in the 60s  Relevant CV Studies: Procedures   LEFT HEART CATH AND CORONARY ANGIOGRAPHY 12/22/16  Conclusion     Prox LAD lesion is 99% stenosed with 99% stenosed side branch in Ost 1st Diag.  The left ventricular systolic function is normal.  LV end diastolic pressure is normal.  The left ventricular ejection fraction is 55-65% by visual estimate.   Conclusion Overall left ventricular function ejection fraction of 60% Complex lesion in the proximal LAD bifurcation lesion with diagonal 99% culprit lesion Nondominant right coronary artery Patient had intervention deferred This was discussed with Dr. Jerrell Mylar at [facility] who agrees that complex intervention at a tertiary care center is preferable and he is except the patient at [facility] in transfer. Heparin is to be restarted within the next 4 to 6 hours at previous rate without bolus     Laboratory Data:  Chemistry Recent Labs  Lab 12/20/17 2134 12/21/17 0501  NA 141 141  K 4.9 4.3  CL 108 110  CO2 25 26  GLUCOSE 202* 91  BUN 22* 22*  CREATININE 0.87 0.81  CALCIUM 9.2 9.0  GFRNONAA 57* >60  GFRAA >60  >60  ANIONGAP 8 5    No results for input(s): PROT, ALBUMIN, AST, ALT, ALKPHOS, BILITOT in the last 168 hours. Hematology Recent Labs  Lab 12/21/17 0501 12/22/17 1302  WBC 8.7 7.6  RBC 4.27 4.50  HGB 10.9* 11.5*  HCT 34.3* 36.0  MCV 80.2 80.1  MCH 25.6* 25.6*  MCHC 31.9* 31.9*  RDW 16.9* 16.9*  PLT 201 187   Cardiac Enzymes Recent Labs  Lab 12/21/17 0156 12/21/17 0901 12/21/17 1744  TROPONINI 2.38* 3.76* 2.02*   No results for input(s): TROPIPOC in the last 168 hours.  BNP Recent Labs  Lab 12/20/17 2135  BNP 155.0*    DDimer No results for input(s): DDIMER in the last 168 hours.  Radiology/Studies:  No results found.  Assessment and Plan:   1. NSTEMI/CAD: diagnostic LHC 12/22/17 at outside hospital showed  a complex lesion in the proximal LAD bifurcation with the diagonal vessel, 99% stenosed. LVEF normal. PCI felt to be high risk, thus recommendations were to transfer pt to a tertiary care center for intervention. Cath is scheduled with Dr. Swaziland on 12/23/17. Continue IV heparin until cath. Continue ASA and BB. Will add statin.   2. H/o Atrial Fibrillation: rate is controlled. Coumadin has been on hold for cath. Resume post cath.   3. Remote H/o PE: coumadin on hold for cath. Resume post cath, once stable from a bleed standpoint.   4. HLD: recent lipid panel 09/2017 showed elevated LDL at 144. Pt currently not on statin. No known allergies. Will start Lipitor 80 mg, in the setting of NSTEMI with known obstructive coronary disease.   Severity of Illness: The appropriate patient status for this patient is INPATIENT. Inpatient status is judged to be reasonable and necessary in order to provide the required intensity of service to ensure the patient's safety. The patient's presenting symptoms, physical exam findings, and initial radiographic and laboratory data in the context of their chronic comorbidities is felt to place them  at high risk for further clinical  deterioration. Furthermore, it is not anticipated that the patient will be medically stable for discharge from the hospital within 2 midnights of admission. The following factors support the patient status of inpatient.   " The patient's presenting symptoms include Unstable Angina. " The worrisome physical exam findings include abnormal cardiac cath, showing obstructive CAD. " The initial radiographic and laboratory data are worrisome because of elevated troponin. " The chronic co-morbidities include HTN, PAF, HLD, OSA.   * I certify that at the point of admission it is my clinical judgment that the patient will require inpatient hospital care spanning beyond 2 midnights from the point of admission due to high intensity of service, high risk for further deterioration and high frequency of surveillance required.*   For questions or updates, please contact [facility] Please consult [email or web address] for contact info under Cardiology/STEMI.   Signed, Robbie Lis, PA-C  12/22/2017 7:10 PM   The patient was seen, examined and discussed with Brittainy M. Sharol Harness, PA-C and I agree with the above.   84 y.o. female with a history of known CAD, followed by Dr. Juliann Pares in [place], being transferred from [facility] to [facility] for high risk PCI. Also with h/o PAF, remote bilateral PE, chronic anticoagulation with Coumadin, HTN and OSA.  NSTEMI troponin max 3.76, Dr. Juliann Pares performed diagnostic LHC on 12/22/17 and she was found to have a complex lesion in the proximal LAD bifurcation with the diagonal vessel, 99% stenosed. LVEF normal. PCI felt to be high risk, thus recommendations were to transfer pt to a tertiary care center for intervention. Cath is scheduled with Dr. Swaziland on 12/23/17.  Physical exam reveals, S1, no S2, 4/6 systolic murmur suggestive of a significant aortic stenosis, lungs CTA, 1+ LE edema.  She is currently chest pain free on iv Heparin, asa 81 mg po daily, metoprolol, atorvastatin,  vitals stable. NPO after midnight for a cath in the am. Crea 0.81, Hb 10.9.   The patient wants to wait Lasix till after cath as she wants to get some sleep.  A gradient measurements for evaluation of aortic stenosis during cath should be considered, I will order echocardiogram as well.  Tobias Alexander, MD 12/22/2017

## 2017-12-22 NOTE — Progress Notes (Signed)
Bed assignment given - 3 East room 19/[facility], verbalized an understanding/ [facility] to transport pt.

## 2017-12-22 NOTE — Consult Note (Signed)
Reason for Consult: Non-STEMI unstable angina Referring Physician: Clayborn Bigness primary Dr. Duane Boston hospitalist Cardiologist Clayborn Bigness  Alison Russo is an 84 y.o. female.  HPI: Patient is a 84 year old white female history of hypertension DVT PE obesity mild obstructive sleep apnea presented with significant anginal symptoms over the last few days is gotten progressively worse with unstable anginal symptoms at rest.  Patient states that she had severe midsternal chest pain diaphoresis shortness of breath that was extremely severe.  The patient states she is been having angina off and on for several weeks but it usually goes away this last time it took quite a while patient states that she has had nitroglycerin at home to help in the past but this time the pain did not go away quickly.  Patient was then advised by a friend to call for help and 911 was contacted and she was transported to the emergency room EKG had nondiagnostic findings and troponin was slightly elevated to the patient was admitted for further assessment.  Troponins rose to above 2 she is been on heparin and chest pain is been somewhat improved patient now needs cardiac assessment.  Patient denies previous significant cardiac history.  Past Medical History:  Diagnosis Date  . Anginal pain ([place])   . Dyspnea   . History of blood clots   . Hypertension   . PE (pulmonary embolism)   . Sleep apnea     Past Surgical History:  Procedure Laterality Date  . BREAST CYST ASPIRATION Right    neg  . CARDIAC CATHETERIZATION    . COLONOSCOPY    . HYSTEROSCOPY W/D&C N/A 10/25/2016   Procedure: DILATATION AND CURETTAGE /HYSTEROSCOPY, POLYPECTOMY;  Surgeon: Benjaman Kindler, MD;  Location: [facility];  Service: Gynecology;  Laterality: N/A;  . LEFT HEART CATH AND CORONARY ANGIOGRAPHY N/A 12/21/2017   Procedure: LEFT HEART CATH AND CORONARY ANGIOGRAPHY possible PCI and stent;  Surgeon: Yolonda Kida, MD;  Location: [facility];  Service:  Cardiovascular;  Laterality: N/A;  . TONSILLECTOMY      Family History  Problem Relation Age of Onset  . Breast cancer Mother 57    Social History:  reports that she has never smoked. She has never used smokeless tobacco. She reports that she does not drink alcohol or use drugs.  Allergies: No Known Allergies  Medications: I have reviewed the patient's current medications.  Results for orders placed or performed during the hospital encounter of 12/20/17 (from the past 48 hour(s))  CBC with Differential     Status: Abnormal   Collection Time: 12/20/17  9:34 PM  Result Value Ref Range   WBC 8.4 3.6 - 11.0 K/uL   RBC 4.52 3.80 - 5.20 MIL/uL   Hemoglobin 11.5 (L) 12.0 - 16.0 g/dL   HCT 36.6 35.0 - 47.0 %   MCV 81.1 80.0 - 100.0 fL   MCH 25.4 (L) 26.0 - 34.0 pg   MCHC 31.4 (L) 32.0 - 36.0 g/dL   RDW 16.9 (H) 11.5 - 14.5 %   Platelets 203 150 - 440 K/uL   Neutrophils Relative % 78 %   Neutro Abs 6.6 (H) 1.4 - 6.5 K/uL   Lymphocytes Relative 13 %   Lymphs Abs 1.1 1.0 - 3.6 K/uL   Monocytes Relative 6 %   Monocytes Absolute 0.5 0.2 - 0.9 K/uL   Eosinophils Relative 2 %   Eosinophils Absolute 0.2 0 - 0.7 K/uL   Basophils Relative 1 %   Basophils Absolute 0.1  0 - 0.1 K/uL    Comment: Performed at [facility], [address] [place], [place] [address]  Basic metabolic panel     Status: Abnormal   Collection Time: 12/20/17  9:34 PM  Result Value Ref Range   Sodium 141 135 - 145 mmol/L   Potassium 4.9 3.5 - 5.1 mmol/L   Chloride 108 101 - 111 mmol/L   CO2 25 22 - 32 mmol/L   Glucose, Bld 202 (H) 65 - 99 mg/dL   BUN 22 (H) 6 - 20 mg/dL   Creatinine, Ser 0.87 0.44 - 1.00 mg/dL   Calcium 9.2 8.9 - 10.3 mg/dL   GFR calc non Af Amer 57 (L) >60 mL/min   GFR calc Af Amer >60 >60 mL/min    Comment: (NOTE) The eGFR has been calculated using the CKD EPI equation. This calculation has not been validated in all clinical situations. eGFR's persistently <60 mL/min signify  possible Chronic Kidney Disease.    Anion gap 8 5 - 15    Comment: Performed at [facility], [address] [place], [place] [address]  Troponin I     Status: Abnormal   Collection Time: 12/20/17  9:34 PM  Result Value Ref Range   Troponin I 0.06 (HH) <0.03 ng/mL    Comment: CRITICAL RESULT CALLED TO, READ BACK BY AND VERIFIED WITH SHERIE [facility] AT 2222 ON 12/20/2017 JJB Performed at [facility], [address], [place], [place] [address]   Brain natriuretic peptide     Status: Abnormal   Collection Time: 12/20/17  9:35 PM  Result Value Ref Range   B Natriuretic Peptide 155.0 (H) 0.0 - 100.0 pg/mL    Comment: Performed at [facility], [address] [place], [place] [address]  Protime-INR     Status: None   Collection Time: 12/20/17  9:35 PM  Result Value Ref Range   Prothrombin Time 13.9 11.4 - 15.2 seconds   INR 1.08     Comment: Performed at [facility], [address] [place], [place] [address]  Troponin I     Status: Abnormal   Collection Time: 12/21/17  1:56 AM  Result Value Ref Range   Troponin I 2.38 (HH) <0.03 ng/mL    Comment: CRITICAL RESULT CALLED TO, READ BACK BY AND VERIFIED WITH Hoyle Sauer ON 12/21/17 AT [place] JAG Performed at [facility], [address] [place],  [address]   Protime-INR     Status: None   Collection Time: 12/21/17  5:01 AM  Result Value Ref Range   Prothrombin Time 15.2 11.4 - 15.2 seconds   INR 1.21     Comment: Performed at [facility], [address] [place],  [address]  Basic metabolic panel     Status: Abnormal   Collection Time: 12/21/17  5:01 AM  Result Value Ref Range   Sodium 141 135 - 145 mmol/L   Potassium 4.3 3.5 - 5.1 mmol/L   Chloride 110 101 - 111 mmol/L   CO2 26 22 - 32 mmol/L   Glucose, Bld 91 65 - 99 mg/dL   BUN 22 (H) 6 - 20 mg/dL   Creatinine, Ser 0.81 0.44 - 1.00 mg/dL   Calcium 9.0 8.9 - 10.3 mg/dL   GFR calc non Af Amer >60 >60  mL/min   GFR calc Af Amer >60 >60 mL/min    Comment: (NOTE) The eGFR has been calculated using the CKD EPI equation. This calculation has not  been validated in all clinical situations. eGFR's persistently <60 mL/min signify possible Chronic Kidney Disease.    Anion gap 5 5 - 15    Comment: Performed at [facility], [address] [place], [place] [address]  CBC     Status: Abnormal   Collection Time: 12/21/17  5:01 AM  Result Value Ref Range   WBC 8.7 3.6 - 11.0 K/uL   RBC 4.27 3.80 - 5.20 MIL/uL   Hemoglobin 10.9 (L) 12.0 - 16.0 g/dL   HCT 34.3 (L) 35.0 - 47.0 %   MCV 80.2 80.0 - 100.0 fL   MCH 25.6 (L) 26.0 - 34.0 pg   MCHC 31.9 (L) 32.0 - 36.0 g/dL   RDW 16.9 (H) 11.5 - 14.5 %   Platelets 201 150 - 440 K/uL    Comment: Performed at [facility], [address] [place], [place] [address]  Glucose, capillary     Status: None   Collection Time: 12/21/17  7:51 AM  Result Value Ref Range   Glucose-Capillary 82 65 - 99 mg/dL   Comment 1 Notify RN    Comment 2 Document in Chart   Troponin I     Status: Abnormal   Collection Time: 12/21/17  9:01 AM  Result Value Ref Range   Troponin I 3.76 (HH) <0.03 ng/mL    Comment: CRITICAL VALUE NOTED. VALUE IS CONSISTENT WITH PREVIOUSLY REPORTED/CALLED VALUE AKT Performed at [facility], [address] [place], [place] [address]   Troponin I     Status: Abnormal   Collection Time: 12/21/17  5:44 PM  Result Value Ref Range   Troponin I 2.02 (HH) <0.03 ng/mL    Comment: CRITICAL VALUE NOTED. VALUE IS CONSISTENT WITH PREVIOUSLY REPORTED/CALLED VALUE.  TFK Performed at [facility], [place], [place] [address]   Heparin level (unfractionated)     Status: Abnormal   Collection Time: 12/22/17  2:57 AM  Result Value Ref Range   Heparin Unfractionated 0.12 (L) 0.30 - 0.70 IU/mL    Comment:        IF HEPARIN RESULTS ARE BELOW EXPECTED VALUES, AND PATIENT DOSAGE HAS BEEN  CONFIRMED, SUGGEST FOLLOW UP TESTING OF ANTITHROMBIN III LEVELS. Performed at [facility], [address] [place], [place] [address]   Protime-INR     Status: Abnormal   Collection Time: 12/22/17  2:57 AM  Result Value Ref Range   Prothrombin Time 16.8 (H) 11.4 - 15.2 seconds   INR 1.37     Comment: Performed at [facility], [address] [place],  [address]  Glucose, capillary     Status: Abnormal   Collection Time: 12/22/17  7:31 AM  Result Value Ref Range   Glucose-Capillary 101 (H) 65 - 99 mg/dL    Dg Chest 1 View  Result Date: 12/20/2017 CLINICAL DATA:  Chest pain EXAM: CHEST  1 VIEW COMPARISON:  05/23/2007 FINDINGS: Cardiac shadow is mildly enlarged. Aortic calcifications are again seen. Some increased density is noted in the medial aspect of the right apex increased from the prior exam. Previously these changes were related to prominent mediastinal soft tissues it would be difficult to exclude an underlying infiltrate. Mild interstitial changes of a chronic nature are seen. No focal infiltrate or sizable effusion is noted. IMPRESSION: Increased density in the right apex which may represent an underlying infiltrate. Electronically Signed   By: Inez Catalina M.D.   On: 12/20/2017 21:56    Review of Systems  Constitutional: Positive for diaphoresis and malaise/fatigue.  HENT: Positive for congestion.   Eyes: Negative.   Respiratory: Positive for shortness of breath.   Cardiovascular: Positive for chest pain, orthopnea, leg swelling and PND.  Gastrointestinal: Positive for heartburn.  Genitourinary: Negative.   Musculoskeletal: Negative.   Skin: Negative.   Neurological: Negative.   Endo/Heme/Allergies: Negative.   Psychiatric/Behavioral: Negative.    Blood pressure (!) 165/92, pulse 72, temperature 97.6 F (36.4 C), temperature source Oral, resp. rate 18, height '5\' 6"'  (1.676 m), weight 203 lb 4.8 oz (92.2 kg), SpO2 96 %. Physical Exam  Nursing  note and vitals reviewed. Constitutional: She is oriented to person, place, and time. She appears well-developed and well-nourished.  HENT:  Head: Normocephalic and atraumatic.  Eyes: Pupils are equal, round, and reactive to light. Conjunctivae and EOM are normal.  Neck: Normal range of motion. Neck supple.  Cardiovascular: Normal rate and regular rhythm.  Murmur heard. Respiratory: Effort normal and breath sounds normal.  GI: Soft. Bowel sounds are normal.  Musculoskeletal: Normal range of motion.  Neurological: She is alert and oriented to person, place, and time. She has normal reflexes.  Skin: Skin is warm and dry.  Psychiatric: She has a normal mood and affect.    Assessment/Plan: Non-STEMI Elevated troponin Unstable angina Hypertension Shortness of breath History of DVT Obesity History of pulmonary embolus Obstructive sleep apnea Long-term anticoagulation on Coumadin . Plan Agree with admission to telemetry Up troponins and EKGs Anticoagulation with IV heparin Hold Coumadin Continue hypertension control Prinivil Lopressor Maintain Zoloft for mild depression Iron therapy for anemia Echocardiogram may be helpful for assessment of shortness of breath Recommend invasive strategy with cardiac cath within the next 24-48 hours   Lavan Imes D Randie Bloodgood 12/22/2017, 8:20 AM

## 2017-12-22 NOTE — Progress Notes (Signed)
ANTICOAGULATION CONSULT NOTE - Initial Consult  Pharmacy Consult for heparin drip Indication: chest pain/ACS  No Known Allergies  Patient Measurements: Height: 5\' 6"  (167.6 cm) Weight: 203 lb 4.8 oz (92.2 kg) IBW/kg (Calculated) : 59.3 Heparin Dosing Weight: 80 kg  Vital Signs: Temp: 97.6 F (36.4 C) (04/04 0351) Temp Source: Oral (04/04 0351) BP: 151/60 (04/04 0351) Pulse Rate: 65 (04/04 0351)  Labs: Recent Labs    12/20/17 2134 12/20/17 2135 12/21/17 0156 12/21/17 0501 12/21/17 0901 12/21/17 1744 12/22/17 0257  HGB 11.5*  --   --  10.9*  --   --   --   HCT 36.6  --   --  34.3*  --   --   --   PLT 203  --   --  201  --   --   --   LABPROT  --  13.9  --  15.2  --   --  16.8*  INR  --  1.08  --  1.21  --   --  1.37  HEPARINUNFRC  --   --   --   --   --   --  0.12*  CREATININE 0.87  --   --  0.81  --   --   --   TROPONINI 0.06*  --  2.38*  --  3.76* 2.02*  --     Estimated Creatinine Clearance: 53.9 mL/min (by C-G formula based on SCr of 0.81 mg/dL).   Medical History: Past Medical History:  Diagnosis Date  . Anginal pain ([facility])   . Dyspnea   . History of blood clots   . Hypertension   . PE (pulmonary embolism)   . Sleep apnea     Medications:  On warfarin as outpatient 5 mg daily except Sunday and Thursday.  Assessment: INR subtherapeutic on admission  Goal of Therapy:  INR 2-3  Heparin level 0.3-0.7. Monitor platelets by anticoagulation protocol: Yes   Plan:  Heparin 4000 unit bolus and initial rate of 950 units/hr. First heparin level 8 hours after start of infusion.  Continue home warfarin regimen as above.   4/3 1530 Patient now post Cath- According to nurse Cardiology gave instructions to restart heparin drip at 1900. [facility]. Will check HL 8 hours after heparin drip start time.    04/04 0300 heparin level 0.12. 2400 unit bolus and increase rate to 1250 units/hr. Recheck in 8 hours.  Erich MontaneMcBane,Johnnie Goynes S,  PharmD, BCPS Clinical Pharmacist 12/22/2017 4:25 AM

## 2017-12-22 NOTE — Progress Notes (Signed)
ANTICOAGULATION CONSULT NOTE  Pharmacy Consult for heparin drip Indication: chest pain/ACS  No Known Allergies  Patient Measurements:   Heparin Dosing Weight: 80 kg  Vital Signs: Temp: 97.6 F (36.4 C) (04/04 1856) Temp Source: Oral (04/04 1856) BP: 145/76 (04/04 1856) Pulse Rate: 59 (04/04 1856)  Labs: Recent Labs    12/20/17 2134 12/20/17 2135 12/21/17 0156 12/21/17 0501 12/21/17 0901 12/21/17 1744 12/22/17 0257 12/22/17 1302  HGB 11.5*  --   --  10.9*  --   --   --  11.5*  HCT 36.6  --   --  34.3*  --   --   --  36.0  PLT 203  --   --  201  --   --   --  187  LABPROT  --  13.9  --  15.2  --   --  16.8*  --   INR  --  1.08  --  1.21  --   --  1.37  --   HEPARINUNFRC  --   --   --   --   --   --  0.12* 0.50  CREATININE 0.87  --   --  0.81  --   --   --   --   TROPONINI 0.06*  --  2.38*  --  3.76* 2.02*  --   --     Estimated Creatinine Clearance: 53.9 mL/min (by C-G formula based on SCr of 0.81 mg/dL).   Assessment: 89 YOF on warfarin as outpatient (5mg  daily except Sundays and Thursdays). Started on IV heparin for ACS. [facility] and underwent cath, [facility]- to undergo cath/intervention with Dr. [place] tomorrow.  Heparin level earlier this afternoon was in range at 0.5units/mL on 1250 units/hr.  Goal of Therapy:  Heparin level 0.3-0.7. Monitor platelets by anticoagulation protocol: Yes   Plan:  Continue heparin at 1250 units/hr Daily heparin level and CBC Follow up plans post cath.  Luigi Stuckey D. Orphia Mctigue, PharmD, BCPS Clinical Pharmacist [phone] 12/22/2017 7:51 PM

## 2017-12-22 NOTE — Discharge Summary (Signed)
[facility] - [place] at [facility]   PATIENT NAME: Alison Russo    MR#:  [id number]  DATE OF BIRTH:  09/28/1925  DATE OF ADMISSION:  12/20/2017 ADMITTING PHYSICIAN: Cammy Copa, MD  DATE OF DISCHARGE: 12/22/2017   PRIMARY CARE PHYSICIAN: Lyndon Code, MD    ADMISSION DIAGNOSIS:  Chest pain, unspecified type [R07.9]  DISCHARGE DIAGNOSIS:  Active Problems:   NSTEMI (non-ST elevated myocardial infarction) (HCC)   SECONDARY DIAGNOSIS:   Past Medical History:  Diagnosis Date  . Anginal pain (HCC)   . Dyspnea   . History of blood clots   . Hypertension   . PE (pulmonary embolism)   . Sleep apnea     HOSPITAL COURSE:   1.acute NSTEMI Stable  Continue heparin drip as bridge to Coumadin-subtherapeutic on Coumadin on admission, aspirin, lisinopril, AV nodal blocking agent avoided given mild bradycardia, cardiology following-for heart catheterization done for further evaluation - showed LAD lesion at bifurcation- Suggested to transfer to tertiary care center for intervention=- accepted at [facility] by Dr. Loel Dubonnet.  2.history of coronary artery disease  Plan of care as stated above  3. history of pulmonary embolism/DVT Chronically on Coumadin at home-INR subtherapeutic on admission  Continue heparin bridge to Coumadin-pharmacy to dose  4.  chronic benign essential hypertension controlled on current regimen  DISCHARGE CONDITIONS:   stable  CONSULTS OBTAINED:  Treatment Team:  Alwyn Pea, MD Lbcardiology, Rounding, MD  DRUG ALLERGIES:  No Known Allergies  DISCHARGE MEDICATIONS:   Allergies as of 12/22/2017   No Known Allergies     Medication List    STOP taking these medications   warfarin 5 MG tablet Commonly known as:  COUMADIN     TAKE these medications   acetaminophen 500 MG tablet Commonly known as:  TYLENOL Take 1,000 mg by mouth 2 (two) times daily as needed (for pain/headache/fever).   aspirin 81 MG  chewable tablet Chew 1 tablet (81 mg total) by mouth daily. Start taking on:  12/23/2017   CALCIUM PO Take 1,200 mg by mouth daily.   Cyanocobalamin 2500 MCG Subl Place 2,500 mcg under the tongue daily.   FERROUSUL 325 (65 FE) MG tablet Generic drug:  ferrous sulfate Take 325 mg by mouth daily.   heparin 100-0.45 UNIT/ML-% infusion Inject 1,250 Units/hr into the vein continuous.   lisinopril 5 MG tablet Commonly known as:  PRINIVIL,ZESTRIL Take 5 mg by mouth daily.   loratadine 10 MG tablet Commonly known as:  CLARITIN Take 10 mg by mouth daily.   metoprolol tartrate 12.5 mg Tabs tablet Commonly known as:  LOPRESSOR Take 12.5 mg by mouth 2 (two) times daily. What changed:  Another medication with the same name was removed. Continue taking this medication, and follow the directions you see here.   OPCON-A OP Place 1-2 drops into both eyes 3 (three) times daily as needed (for itchy/irritated eyes.).   PRESERVISION AREDS 2+MULTI VIT PO Take 1 tablet by mouth 2 (two) times daily.   sertraline 100 MG tablet Commonly known as:  ZOLOFT Take 100 mg by mouth daily.   traZODone 50 MG tablet Commonly known as:  DESYREL Take 0.5 tablets (25 mg total) by mouth at bedtime as needed for sleep.   vitamin C 500 MG tablet Commonly known as:  ASCORBIC ACID Take 500 mg by mouth daily.        DISCHARGE INSTRUCTIONS:    Follow as advised by Cardiology.  If you experience worsening of your  admission symptoms, develop shortness of breath, life threatening emergency, suicidal or homicidal thoughts you must seek medical attention immediately by calling 911 or calling your MD immediately  if symptoms less severe.  You Must read complete instructions/literature along with all the possible adverse reactions/side effects for all the Medicines you take and that have been prescribed to you. Take any new Medicines after you have completely understood and accept all the possible adverse  reactions/side effects.   Please note  You were cared for by a hospitalist during your hospital stay. If you have any questions about your discharge medications or the care you received while you were in the hospital after you are discharged, you can call the unit and asked to speak with the hospitalist on call if the hospitalist that took care of you is not available. Once you are discharged, your primary care physician will handle any further medical issues. Please note that NO REFILLS for any discharge medications will be authorized once you are discharged, as it is imperative that you return to your primary care physician (or establish a relationship with a primary care physician if you do not have one) for your aftercare needs so that they can reassess your need for medications and monitor your lab values.    Today   CHIEF COMPLAINT:   Chief Complaint  Patient presents with  . Chest Pain    HISTORY OF PRESENT ILLNESS:  Alison Russo  is a 84 y.o. female with a known history of spontaneous bilateral PE, on chronic anticoagulation, angina, hypertension and sleep apnea. Patient was brought to emergency room for acute onset of retrosternal chest pain described as constant 5 out of 10 pressure with radiation to both arms and to her jaw.  Shortness of breath, nausea and diaphoresis were associated with the chest pain.  No fever/chills, no cough.  Her symptoms started at rest approximately 3-4 hours before presenting to emergency room.  Per patient, her CP was worse with exertion, but it resolved with sublingual nitroglycerin given by paramedics. While in the emergency room, her oxygen saturation was 90-93% on room air.  Troponin level is slightly elevated at 0.06.  EKG, reviewed by myself, shows normal sinus rhythm, normal axis.  No acute ischemic changes.  Chest x-ray, reviewed by myself, is negative for any acute cardiopulmonary changes. Patient is admitted for further evaluation and  treatment.     VITAL SIGNS:  Blood pressure (!) 165/92, pulse 72, temperature 97.6 F (36.4 C), temperature source Oral, resp. rate 18, height 5\' 6"  (1.676 m), weight 92.2 kg (203 lb 4.8 oz), SpO2 96 %.  I/O:    Intake/Output Summary (Last 24 hours) at 12/22/2017 1521 Last data filed at 12/22/2017 1404 Gross per 24 hour  Intake 960 ml  Output 0 ml  Net 960 ml    PHYSICAL EXAMINATION:  GENERAL:  84 y.o.-year-old patient lying in the bed with no acute distress.  EYES: Pupils equal, round, reactive to light and accommodation. No scleral icterus. Extraocular muscles intact.  HEENT: Head atraumatic, normocephalic. Oropharynx and nasopharynx clear.  NECK:  Supple, no jugular venous distention. No thyroid enlargement, no tenderness.  LUNGS: Normal breath sounds bilaterally, no wheezing, rales,rhonchi or crepitation. No use of accessory muscles of respiration.  CARDIOVASCULAR: S1, S2 normal. No murmurs, rubs, or gallops.  ABDOMEN: Soft, non-tender, non-distended. Bowel sounds present. No organomegaly or mass.  EXTREMITIES: No pedal edema, cyanosis, or clubbing.  NEUROLOGIC: Cranial nerves II through XII are intact. Muscle strength 5/5 in  all extremities. Sensation intact. Gait not checked.  PSYCHIATRIC: The patient is alert and oriented x 3.  SKIN: No obvious rash, lesion, or ulcer.   DATA REVIEW:   CBC Recent Labs  Lab 12/22/17 1302  WBC 7.6  HGB 11.5*  HCT 36.0  PLT 187    Chemistries  Recent Labs  Lab 12/21/17 0501  NA 141  K 4.3  CL 110  CO2 26  GLUCOSE 91  BUN 22*  CREATININE 0.81  CALCIUM 9.0    Cardiac Enzymes Recent Labs  Lab 12/21/17 1744  TROPONINI 2.02*    Microbiology Results  No results found for this or any previous visit.  RADIOLOGY:  Dg Chest 1 View  Result Date: 12/20/2017 CLINICAL DATA:  Chest pain EXAM: CHEST  1 VIEW COMPARISON:  05/23/2007 FINDINGS: Cardiac shadow is mildly enlarged. Aortic calcifications are again seen. Some increased  density is noted in the medial aspect of the right apex increased from the prior exam. Previously these changes were related to prominent mediastinal soft tissues it would be difficult to exclude an underlying infiltrate. Mild interstitial changes of a chronic nature are seen. No focal infiltrate or sizable effusion is noted. IMPRESSION: Increased density in the right apex which may represent an underlying infiltrate. Electronically Signed   By: Alcide CleverMark  Lukens M.D.   On: 12/20/2017 21:56    EKG:   Orders placed or performed during the hospital encounter of 12/20/17  . ED EKG  . ED EKG  . EKG 12-Lead  . EKG 12-Lead  . EKG 12-Lead  . EKG 12-Lead      Management plans discussed with the patient, family and they are in agreement.  CODE STATUS:     Code Status Orders  (From admission, onward)        Start     Ordered   12/21/17 0208  Full code  Continuous     12/21/17 0207    Code Status History    This patient has a current code status but no historical code status.    Advance Directive Documentation     Most Recent Value  Type of Advance Directive  Healthcare Power of Attorney, Living will  Pre-existing out of facility DNR order (yellow form or pink MOST form)  -  "MOST" Form in Place?  -      TOTAL TIME TAKING CARE OF THIS PATIENT: 45 minutes.    Altamese DillingVaibhavkumar Shantai Tiedeman M.D on 12/22/2017 at 3:21 PM  Between 7am to 6pm - Pager - [phone]  After 6pm go to [email or web address] - password [email or web address] [facility]  [facility]  Office  [phone]  CC: Primary care physician; Lyndon CodeKhan, Fozia M, MD   Note: This dictation was prepared with Dragon dictation along with smaller phrase technology. Any transcriptional errors that result from this process are unintentional.

## 2017-12-22 NOTE — Care Management (Signed)
Informed during progression that [facility].  CM contacted [employer] and informed that [facility] and [facility] are out of network for patient's insurance.  spoke with family members and they would not want to pursue facility that is out of network.  Informed by [employer] that [facility] in network

## 2017-12-22 NOTE — Progress Notes (Signed)
ANTICOAGULATION CONSULT NOTE - Initial Consult  Pharmacy Consult for heparin drip Indication: chest pain/ACS  No Known Allergies  Patient Measurements: Height: 5\' 6"  (167.6 cm) Weight: 203 lb 4.8 oz (92.2 kg) IBW/kg (Calculated) : 59.3 Heparin Dosing Weight: 80 kg  Vital Signs: Temp: 97.6 F (36.4 C) (04/04 0733) Temp Source: Oral (04/04 0733) BP: 165/92 (04/04 0733) Pulse Rate: 72 (04/04 0733)  Labs: Recent Labs    12/20/17 2134 12/20/17 2135 12/21/17 0156 12/21/17 0501 12/21/17 0901 12/21/17 1744 12/22/17 0257 12/22/17 1302  HGB 11.5*  --   --  10.9*  --   --   --  11.5*  HCT 36.6  --   --  34.3*  --   --   --  36.0  PLT 203  --   --  201  --   --   --  187  LABPROT  --  13.9  --  15.2  --   --  16.8*  --   INR  --  1.08  --  1.21  --   --  1.37  --   HEPARINUNFRC  --   --   --   --   --   --  0.12* 0.50  CREATININE 0.87  --   --  0.81  --   --   --   --   TROPONINI 0.06*  --  2.38*  --  3.76* 2.02*  --   --     Estimated Creatinine Clearance: 53.9 mL/min (by C-G formula based on SCr of 0.81 mg/dL).   Medical History: Past Medical History:  Diagnosis Date  . Anginal pain (HCC)   . Dyspnea   . History of blood clots   . Hypertension   . PE (pulmonary embolism)   . Sleep apnea     Medications:  On warfarin as outpatient 5 mg daily except Sunday and Thursday.  Assessment: INR subtherapeutic on admission  Goal of Therapy:  INR 2-3  Heparin level 0.3-0.7. Monitor platelets by anticoagulation protocol: Yes   Plan:  Heparin 4000 unit bolus and initial rate of 950 units/hr. First heparin level 8 hours after start of infusion.  Continue home warfarin regimen as above.   4/3 1530 Patient now post Cath- According to nurse Cardiology gave instructions to restart heparin drip at 1900. Patient to be transferred to [facility] for CABG. Will check HL 8 hours after heparin drip start time.     04 /04 0300 heparin level 0.12. 2400 unit bolus and increase rate to 1250  units/hr. Recheck in 8 hours.  4/4 @1500  HL 0.50, will continue heparin iv 1250units/hr and recheck in 8 hours   Luan PullingGarrett Kandi Brusseau, PharmD, BCPS Clinical Pharmacist 12/22/2017 3:04 PM

## 2017-12-23 ENCOUNTER — Inpatient Hospital Stay (HOSPITAL_COMMUNITY)
Admission: AD | Disposition: A | Discharge status: Home / Self Care | Payer: Self-pay | Source: Other Acute Inpatient Hospital | Attending: Internal Medicine

## 2017-12-23 ENCOUNTER — Encounter (HOSPITAL_COMMUNITY): Payer: Self-pay | Admitting: Cardiology

## 2017-12-23 ENCOUNTER — Inpatient Hospital Stay (HOSPITAL_COMMUNITY): Payer: PPO

## 2017-12-23 ENCOUNTER — Other Ambulatory Visit: Payer: Self-pay

## 2017-12-23 DIAGNOSIS — I2510 Atherosclerotic heart disease of native coronary artery without angina pectoris: Secondary | ICD-10-CM

## 2017-12-23 HISTORY — PX: CORONARY STENT INTERVENTION: CATH118234

## 2017-12-23 LAB — BASIC METABOLIC PANEL
ANION GAP: 7 (ref 5–15)
BUN: 14 mg/dL (ref 6–20)
CALCIUM: 9 mg/dL (ref 8.9–10.3)
CO2: 23 mmol/L (ref 22–32)
Chloride: 109 mmol/L (ref 101–111)
Creatinine, Ser: 0.81 mg/dL (ref 0.44–1.00)
GFR calc Af Amer: >60 mL/min (ref >60)
GLUCOSE: 100 mg/dL — AB (ref 65–99)
Potassium: 4.1 mmol/L (ref 3.5–5.1)
Sodium: 139 mmol/L (ref 135–145)

## 2017-12-23 LAB — CBC
HCT: 36.7 % (ref 36.0–46.0)
HEMOGLOBIN: 11.2 g/dL — AB (ref 12.0–15.0)
MCH: 25.3 pg — ABNORMAL LOW (ref 26.0–34.0)
MCHC: 30.5 g/dL (ref 30.0–36.0)
MCV: 83 fL (ref 78.0–100.0)
Platelets: 184 10*3/uL (ref 150–400)
RBC: 4.42 MIL/uL (ref 3.87–5.11)
RDW: 16 % — ABNORMAL HIGH (ref 11.5–15.5)
WBC: 8.5 10*3/uL (ref 4.0–10.5)

## 2017-12-23 LAB — LIPID PANEL
CHOL/HDL RATIO: 4.3 ratio
Cholesterol: 187 mg/dL (ref 0–200)
HDL: 43 mg/dL (ref >40)
LDL CALC: 117 mg/dL — AB (ref 0–99)
Triglycerides: 135 mg/dL (ref <150)
VLDL: 27 mg/dL (ref 0–40)

## 2017-12-23 LAB — PROTIME-INR
INR: 1.49
PROTHROMBIN TIME: 17.9 s — AB (ref 11.4–15.2)

## 2017-12-23 LAB — HEPARIN LEVEL (UNFRACTIONATED): HEPARIN UNFRACTIONATED: 0.45 [IU]/mL (ref 0.30–0.70)

## 2017-12-23 LAB — POCT ACTIVATED CLOTTING TIME: ACTIVATED CLOTTING TIME: 510 s

## 2017-12-23 SURGERY — CORONARY STENT INTERVENTION
Anesthesia: LOCAL

## 2017-12-23 MED ORDER — LIDOCAINE HCL (PF) 1 % IJ SOLN
INTRAMUSCULAR | Status: DC | PRN
  Administered 2017-12-23: 15 mL
  Administered 2017-12-23: 2 mL

## 2017-12-23 MED ORDER — MIDAZOLAM HCL 2 MG/2ML IJ SOLN
INTRAMUSCULAR | Status: AC
  Filled 2017-12-23: qty 2

## 2017-12-23 MED ORDER — HEPARIN SODIUM (PORCINE) 1000 UNIT/ML IJ SOLN
INTRAMUSCULAR | Status: AC
  Filled 2017-12-23: qty 1

## 2017-12-23 MED ORDER — BIVALIRUDIN BOLUS VIA INFUSION - CUPID
INTRAVENOUS | Status: DC | PRN
  Administered 2017-12-23: 68.25 mg via INTRAVENOUS

## 2017-12-23 MED ORDER — LABETALOL HCL 5 MG/ML IV SOLN
10.0000 mg | INTRAVENOUS | Status: AC | PRN
  Administered 2017-12-23: 10 mg via INTRAVENOUS
  Filled 2017-12-23: qty 4

## 2017-12-23 MED ORDER — CLOPIDOGREL BISULFATE 75 MG PO TABS
600.0000 mg | ORAL_TABLET | Freq: Once | ORAL | Status: AC
  Administered 2017-12-23: 600 mg via ORAL
  Filled 2017-12-23: qty 8

## 2017-12-23 MED ORDER — MORPHINE SULFATE (PF) 2 MG/ML IV SOLN
1.0000 mg | INTRAVENOUS | Status: DC | PRN
  Administered 2017-12-23 (×2): 1 mg via INTRAVENOUS
  Filled 2017-12-23: qty 1

## 2017-12-23 MED ORDER — SODIUM CHLORIDE 0.9% FLUSH
3.0000 mL | Freq: Two times a day (BID) | INTRAVENOUS | Status: DC
  Administered 2017-12-24 – 2017-12-26 (×5): 3 mL via INTRAVENOUS

## 2017-12-23 MED ORDER — SODIUM CHLORIDE 0.9 % IV SOLN
INTRAVENOUS | Status: DC | PRN
  Administered 2017-12-23: 1.75 mg/kg/h
  Administered 2017-12-23: 1.75 mg/kg/h via INTRAVENOUS

## 2017-12-23 MED ORDER — FENTANYL CITRATE (PF) 100 MCG/2ML IJ SOLN
INTRAMUSCULAR | Status: DC | PRN
  Administered 2017-12-23: 25 ug via INTRAVENOUS

## 2017-12-23 MED ORDER — IOPAMIDOL (ISOVUE-370) INJECTION 76%
INTRAVENOUS | Status: AC
  Filled 2017-12-23: qty 50

## 2017-12-23 MED ORDER — VERAPAMIL HCL 2.5 MG/ML IV SOLN
INTRAVENOUS | Status: AC
  Filled 2017-12-23: qty 2

## 2017-12-23 MED ORDER — SODIUM CHLORIDE 0.9 % WEIGHT BASED INFUSION
1.0000 mL/kg/h | INTRAVENOUS | Status: AC
  Administered 2017-12-23: 1 mL/kg/h via INTRAVENOUS

## 2017-12-23 MED ORDER — IOPAMIDOL (ISOVUE-370) INJECTION 76%
INTRAVENOUS | Status: AC
  Filled 2017-12-23: qty 125

## 2017-12-23 MED ORDER — BIVALIRUDIN TRIFLUOROACETATE 250 MG IV SOLR
INTRAVENOUS | Status: AC
  Filled 2017-12-23: qty 250

## 2017-12-23 MED ORDER — MIDAZOLAM HCL 2 MG/2ML IJ SOLN
INTRAMUSCULAR | Status: DC | PRN
  Administered 2017-12-23: 1 mg via INTRAVENOUS

## 2017-12-23 MED ORDER — ONDANSETRON HCL 4 MG/2ML IJ SOLN
4.0000 mg | Freq: Four times a day (QID) | INTRAMUSCULAR | Status: DC | PRN
  Administered 2017-12-23: 4 mg via INTRAVENOUS
  Filled 2017-12-23: qty 2

## 2017-12-23 MED ORDER — ACETAMINOPHEN 325 MG PO TABS: 650.0000 mg | ORAL_TABLET | ORAL | Status: DC | PRN

## 2017-12-23 MED ORDER — SODIUM CHLORIDE 0.9 % IV SOLN: 250.0000 mL | INTRAVENOUS | Status: DC | PRN

## 2017-12-23 MED ORDER — IOPAMIDOL (ISOVUE-370) INJECTION 76%
INTRAVENOUS | Status: AC
  Filled 2017-12-23: qty 100

## 2017-12-23 MED ORDER — HEPARIN (PORCINE) IN NACL 2-0.9 UNIT/ML-% IJ SOLN
INTRAMUSCULAR | Status: AC | PRN
  Administered 2017-12-23 (×2): 500 mL

## 2017-12-23 MED ORDER — SODIUM CHLORIDE 0.9% FLUSH: 3.0000 mL | INTRAVENOUS | Status: DC | PRN

## 2017-12-23 MED ORDER — FENTANYL CITRATE (PF) 100 MCG/2ML IJ SOLN
INTRAMUSCULAR | Status: AC
  Filled 2017-12-23: qty 2

## 2017-12-23 MED ORDER — NITROGLYCERIN 1 MG/10 ML FOR IR/CATH LAB
INTRA_ARTERIAL | Status: AC
  Filled 2017-12-23: qty 10

## 2017-12-23 MED ORDER — ATROPINE SULFATE 1 MG/10ML IJ SOSY
PREFILLED_SYRINGE | INTRAMUSCULAR | Status: AC
  Filled 2017-12-23: qty 10

## 2017-12-23 MED ORDER — HEPARIN (PORCINE) IN NACL 2-0.9 UNIT/ML-% IJ SOLN
INTRAMUSCULAR | Status: AC
  Filled 2017-12-23: qty 1000

## 2017-12-23 MED ORDER — HYDRALAZINE HCL 20 MG/ML IJ SOLN: 5.0000 mg | INTRAMUSCULAR | Status: AC | PRN

## 2017-12-23 MED ORDER — LIDOCAINE HCL (PF) 1 % IJ SOLN
INTRAMUSCULAR | Status: AC
  Filled 2017-12-23: qty 30

## 2017-12-23 MED ORDER — NITROGLYCERIN 1 MG/10 ML FOR IR/CATH LAB
INTRA_ARTERIAL | Status: DC | PRN
  Administered 2017-12-23: 200 ug via INTRACORONARY

## 2017-12-23 MED ORDER — CLOPIDOGREL BISULFATE 75 MG PO TABS
75.0000 mg | ORAL_TABLET | Freq: Every day | ORAL | Status: DC
  Administered 2017-12-24 – 2017-12-26 (×3): 75 mg via ORAL
  Filled 2017-12-23 (×3): qty 1

## 2017-12-23 MED ORDER — HEPARIN (PORCINE) IN NACL 2-0.9 UNIT/ML-% IJ SOLN
INTRAMUSCULAR | Status: DC | PRN
  Administered 2017-12-23: 10 mL via INTRA_ARTERIAL

## 2017-12-23 MED ORDER — IOPAMIDOL (ISOVUE-370) INJECTION 76%
INTRAVENOUS | Status: DC | PRN
  Administered 2017-12-23: 240 mL

## 2017-12-23 SURGICAL SUPPLY — 25 items
BALLN SAPPHIRE 2.0X12 (BALLOONS) ×3
BALLOON SAPPHIRE 2.0X12 (BALLOONS) ×2 IMPLANT
BAND ZEPHYR COMPRESS 30 LONG (HEMOSTASIS) ×3 IMPLANT
CATH EXPO 5F MPA-1 (CATHETERS) ×3 IMPLANT
CATH LAUNCHER 5F RADL (CATHETERS) ×2 IMPLANT
CATH LAUNCHER 6FR EBU 4 (CATHETERS) ×3 IMPLANT
CATH VISTA GUIDE 6FR XBLAD3.5 (CATHETERS) ×3 IMPLANT
CATHETER LAUNCHER 5F RADL (CATHETERS) ×3
ELECT DEFIB PAD ADLT CADENCE (PAD) ×3 IMPLANT
GUIDEWIRE INQWIRE 1.5J.035X260 (WIRE) ×2 IMPLANT
INQWIRE 1.5J .035X260CM (WIRE) ×3
KIT ENCORE 26 ADVANTAGE (KITS) ×3 IMPLANT
KIT HEART LEFT (KITS) ×3 IMPLANT
NEEDLE PERC ENTRY 21G 2.5CM (NEEDLE) ×3 IMPLANT
PACK CARDIAC CATHETERIZATION (CUSTOM PROCEDURE TRAY) ×3 IMPLANT
SHEATH AVANTI 11CM 6FR (SHEATH) ×3 IMPLANT
SHEATH RAIN RADIAL 21G 6FR (SHEATH) ×3 IMPLANT
STENT SIERRA 2.25 X 12 MM (Permanent Stent) ×3 IMPLANT
STENT SIERRA 2.50 X 23 MM (Permanent Stent) ×3 IMPLANT
TRANSDUCER W/STOPCOCK (MISCELLANEOUS) ×3 IMPLANT
TUBING CIL FLEX 10 FLL-RA (TUBING) ×3 IMPLANT
WIRE ASAHI PROWATER 180CM (WIRE) ×3 IMPLANT
WIRE EMERALD 3MM-J .035X150CM (WIRE) ×3 IMPLANT
WIRE HI TORQ VERSACORE-J 145CM (WIRE) ×3 IMPLANT
WIRE HI TORQ WHISPER MS 190CM (WIRE) ×3 IMPLANT

## 2017-12-23 NOTE — Progress Notes (Signed)
[facility] BAND REMOVAL  LOCATION:    right radial  DEFLATED PER PROTOCOL:    Yes.    TIME BAND OFF / DRESSING APPLIED:    1730   SITE UPON ARRIVAL:    Level 1( small bruise)  SITE AFTER BAND REMOVAL:    Level 1( small bruise)  CIRCULATION SENSATION AND MOVEMENT:    Within Normal Limits   Yes.    COMMENTS:   Tolerated procedure well

## 2017-12-23 NOTE — Progress Notes (Signed)
ANTICOAGULATION CONSULT NOTE  Pharmacy Consult:  Heparin Indication: chest pain/ACS  No Known Allergies  Patient Measurements: Weight: 200 lb 11.2 oz (91 kg) Heparin Dosing Weight: 80 kg  Vital Signs: Temp: 98 F (36.7 C) (04/05 0748) Temp Source: Oral (04/05 0748) BP: 153/78 (04/05 0748) Pulse Rate: 61 (04/05 0748)  Labs: Recent Labs    12/20/17 2134  12/21/17 0156 12/21/17 0501 12/21/17 0901 12/21/17 1744 12/22/17 0257 12/22/17 1302 12/23/17 0736 12/23/17 0737  HGB 11.5*  --   --  10.9*  --   --   --  11.5* 11.2*  --   HCT 36.6  --   --  34.3*  --   --   --  36.0 36.7  --   PLT 203  --   --  201  --   --   --  187 184  --   LABPROT  --    < >  --  15.2  --   --  16.8*  --  17.9*  --   INR  --    < >  --  1.21  --   --  1.37  --  1.49  --   HEPARINUNFRC  --   --   --   --   --   --  0.12* 0.50  --  0.45  CREATININE 0.87  --   --  0.81  --   --   --   --  0.81  --   TROPONINI 0.06*  --  2.38*  --  3.76* 2.02*  --   --   --   --    < > = values in this interval not displayed.    Estimated Creatinine Clearance: 53.5 mL/min (by C-G formula based on SCr of 0.81 mg/dL).   Assessment: 89 YOF on Coumadin PTA for history of AFib.  Patient was transitioned to IV heparin for ACS.  [facility].  Heparin level remains therapeutic; no bleeding reported.   Goal of Therapy:  Heparin level 0.3-0.7 units/mL Monitor platelets by anticoagulation protocol: Yes     Plan:  Continue heparin gtt at 1250 units/hr Daily heparin level and CBC F/U post cath   Janaye [employer] D. Laney Potashang, PharmD, BCPS Pager:  [phone] 12/23/2017, 10:27 AM

## 2017-12-23 NOTE — Interval H&P Note (Signed)
History and Physical Interval Note:  12/23/2017 10:48 AM  Alison Russo  has presented today for surgery, with the diagnosis of CAD  The various methods of treatment have been discussed with the patient and family. After consideration of risks, benefits and other options for treatment, the patient has consented to  Procedure(s): CORONARY STENT INTERVENTION (N/A) as a surgical intervention .  The patient's history has been reviewed, patient examined, no change in status, stable for surgery.  I have reviewed the patient's chart and labs.  Questions were answered to the patient's satisfaction.   Cath Lab Visit (complete for each Cath Lab visit)  Clinical Evaluation Leading to the Procedure:   ACS: Yes.    Non-ACS:    Anginal Classification: CCS IV  Anti-ischemic medical therapy: Minimal Therapy (1 class of medications)  Non-Invasive Test Results: No non-invasive testing performed  Prior CABG: No previous CABG        Theron Aristaeter [facility] Hospital[facility]MD,FACC 12/23/2017 10:48 AM

## 2017-12-23 NOTE — Progress Notes (Signed)
Site area: right groin  Site Prior to Removal:  Level 1(bruise)  Pressure Applied For 20 MINUTES    Minutes Beginning at 1635  Manual:   Yes.    Patient Status During Pull:  AAOX4  Post Pull Groin Site:  Level 1  Post Pull Instructions Given:  Yes.    Post Pull Pulses Present:  Yes.    Dressing Applied:  Yes.    Comments:  Tolerated procedure well

## 2017-12-24 ENCOUNTER — Inpatient Hospital Stay (HOSPITAL_COMMUNITY): Payer: PPO

## 2017-12-24 DIAGNOSIS — I340 Nonrheumatic mitral (valve) insufficiency: Secondary | ICD-10-CM

## 2017-12-24 LAB — ECHOCARDIOGRAM COMPLETE: Weight: 3234.59 oz

## 2017-12-24 LAB — CBC
HEMATOCRIT: 29 % — AB (ref 36.0–46.0)
HEMOGLOBIN: 9.1 g/dL — AB (ref 12.0–15.0)
MCH: 25.9 pg — AB (ref 26.0–34.0)
MCHC: 31.4 g/dL (ref 30.0–36.0)
MCV: 82.6 fL (ref 78.0–100.0)
Platelets: 180 10*3/uL (ref 150–400)
RBC: 3.51 MIL/uL — ABNORMAL LOW (ref 3.87–5.11)
RDW: 16.6 % — AB (ref 11.5–15.5)
WBC: 9.8 10*3/uL (ref 4.0–10.5)

## 2017-12-24 LAB — HEMOGLOBIN AND HEMATOCRIT, BLOOD
HCT: 27.9 % — ABNORMAL LOW (ref 36.0–46.0)
HEMATOCRIT: 30.2 % — AB (ref 36.0–46.0)
HEMOGLOBIN: 9.2 g/dL — AB (ref 12.0–15.0)
Hemoglobin: 8.6 g/dL — ABNORMAL LOW (ref 12.0–15.0)

## 2017-12-24 LAB — URINALYSIS, ROUTINE W REFLEX MICROSCOPIC
Glucose, UA: NEGATIVE mg/dL
HGB URINE DIPSTICK: NEGATIVE
Ketones, ur: 5 mg/dL — AB
Leukocytes, UA: NEGATIVE
Nitrite: NEGATIVE
Protein, ur: NEGATIVE mg/dL
SPECIFIC GRAVITY, URINE: 1.04 — AB (ref 1.005–1.030)
pH: 5 (ref 5.0–8.0)

## 2017-12-24 LAB — BASIC METABOLIC PANEL
ANION GAP: 9 (ref 5–15)
BUN: 13 mg/dL (ref 6–20)
CALCIUM: 8.8 mg/dL — AB (ref 8.9–10.3)
CO2: 21 mmol/L — AB (ref 22–32)
Chloride: 110 mmol/L (ref 101–111)
Creatinine, Ser: 0.93 mg/dL (ref 0.44–1.00)
GFR calc Af Amer: >60 mL/min (ref >60)
GFR, EST NON AFRICAN AMERICAN: 53 mL/min — AB (ref >60)
GLUCOSE: 124 mg/dL — AB (ref 65–99)
Potassium: 4.2 mmol/L (ref 3.5–5.1)
Sodium: 140 mmol/L (ref 135–145)

## 2017-12-24 LAB — PROTIME-INR
INR: 1.43
PROTHROMBIN TIME: 17.3 s — AB (ref 11.4–15.2)

## 2017-12-24 NOTE — Progress Notes (Addendum)
Progress Note  Patient Name: Alison Russo Date of Encounter: 12/24/2017  Primary Cardiologist: Edd Arbour  Subjective   No chest pain or sob, however, significant R groin pain this AM.  Hematoma noted by nsg and manual compression performed.  Now soft but tender.  Inpatient Medications    Scheduled Meds: . aspirin EC  81 mg Oral Daily  . atorvastatin  80 mg Oral q1800  . clopidogrel  75 mg Oral Q breakfast  . ferrous sulfate  325 mg Oral Daily  . metoprolol tartrate  12.5 mg Oral BID  . sertraline  100 mg Oral Daily  . sodium chloride flush  3 mL Intravenous Q12H   Continuous Infusions: . sodium chloride     PRN Meds: sodium chloride, acetaminophen, acetaminophen, morphine injection, nitroGLYCERIN, ondansetron (ZOFRAN) IV, sodium chloride flush   Vital Signs    Vitals:   12/23/17 1900 12/23/17 2000 12/23/17 2029 12/24/17 0336  BP: 102/61  111/60 97/73  Pulse: 68  77 68  Resp: '17 19 20 20  ' Temp:   97.7 F (36.5 C) 97.9 F (36.6 C)  TempSrc:   Oral Oral  SpO2: 96%  93% 95%  Weight:    202 lb 2.6 oz (91.7 kg)    Intake/Output Summary (Last 24 hours) at 12/24/2017 0716 Last data filed at 12/23/2017 2300 Gross per 24 hour  Intake 1081.75 ml  Output 400 ml  Net 681.75 ml   Filed Weights   12/23/17 0606 12/24/17 0336  Weight: 200 lb 11.2 oz (91 kg) 202 lb 2.6 oz (91.7 kg)    Physical Exam   GEN: Well nourished, well developed, in no acute distress.  HEENT: Grossly normal.  Neck: Supple, obese, difficult to gauge jvp, no carotid bruits, or masses. Cardiac: RRR, 2/6 syst murmur throughout, no rubs, or gallops. No clubbing, cyanosis, edema.  Radials/DP/PT 2+ and equal bilaterally. R groin soft but ecchymotic and tender extending from stick site to medial thigh. No bruit or active bleeding.  No flank pain. Respiratory:  Respirations regular and unlabored, clear to auscultation bilaterally. GI: Soft, nontender, nondistended, BS + x 4. MS: no deformity or  atrophy. Skin: warm and dry, no rash. Neuro:  Strength and sensation are intact. Psych: AAOx3.  Normal affect.  Labs    Chemistry Recent Labs  Lab 12/21/17 0501 12/23/17 0736 12/24/17 0327  NA 141 139 140  K 4.3 4.1 4.2  CL 110 109 110  CO2 26 23 21*  GLUCOSE 91 100* 124*  BUN 22* 14 13  CREATININE 0.81 0.81 0.93  CALCIUM 9.0 9.0 8.8*  GFRNONAA >60 >60 53*  GFRAA >60 >60 >60  ANIONGAP '5 7 9     ' Hematology Recent Labs  Lab 12/22/17 1302 12/23/17 0736 12/24/17 0327  WBC 7.6 8.5 9.8  RBC 4.50 4.42 3.51*  HGB 11.5* 11.2* 9.1*  HCT 36.0 36.7 29.0*  MCV 80.1 83.0 82.6  MCH 25.6* 25.3* 25.9*  MCHC 31.9* 30.5 31.4  RDW 16.9* 16.0* 16.6*  PLT 187 184 180    Cardiac Enzymes Recent Labs  Lab 12/20/17 2134 12/21/17 0156 12/21/17 0901 12/21/17 1744  TROPONINI 0.06* 2.38* 3.76* 2.02*     BNP Recent Labs  Lab 12/20/17 2135  BNP 155.0*      Radiology    No results found.  Telemetry    RSR - Personally Reviewed  Cardiac Studies   Cardiac Catheterization and Percutaneous Coronary Intervention 4.5.2019  Diagnostic  Dominance: Left  Left Anterior Descending  Prox LAD lesion 95% stenosed with side branch in Ost 1st Diag 0% stenosed  Prox LAD lesion is 95% stenosed with non-stenotic side branch in Ost 1st Diag. Vessel is the culprit lesion. The lesion is type C, located at the bifurcation, focal, eccentric and irregular. The lesion was not previously treated.  Mid LAD lesion 80% stenosed  Mid LAD lesion is 80% stenosed. The lesion is focal. The lesion is mildly calcified.  Intervention   Prox LAD lesion with side branch in Ost 1st Diag  Stent - Main Branch  Lesion crossed with guidewire using a WIRE HI TORQ WHISPER MS 190CM. Pre-stent angioplasty was performed using a BALLOON SAPPHIRE 2.0X12. A drug-eluting stent was successfully placed using a STENT SIERRA 2.50 X 23 MM. Maximum pressure: 16 atm. Stent strut is well apposed. Post-stent angioplasty was not  performed.  Post-Intervention Lesion Assessment  The intervention was successful. Pre-interventional TIMI flow is 3. Post-intervention TIMI flow is 3. No complications occurred at this lesion.  There is no residual stenosis in the main branch post intervention.  There is no residual stenosis in the side branch post intervention.  Mid LAD lesion  Stent  Lesion crossed with guidewire using a WIRE HI TORQ WHISPER MS 190CM. Pre-stent angioplasty was performed using a BALLOON SAPPHIRE 2.0X12. A drug-eluting stent was successfully placed using a STENT SIERRA 2.25 X 12 MM. Maximum pressure: 14 atm. Stent strut is well apposed. Post-stent angioplasty was not performed.  Post-Intervention Lesion Assessment  The intervention was successful. Pre-interventional TIMI flow is 3. Post-intervention TIMI flow is 3. No complications occurred at this lesion.  There is no residual stenosis post intervention.    Patient Profile     84 y.o. female w/ a h/o CAD, HTN, OSA, and PE, who was admitted on tx from [facility] following presentation there with NSTEMI and severe prox/mid LAD dzs  now s/p PCI/DES x 2.  Assessment & Plan    1.  NSTEMI/CAD: s/p presentation to [facility] w/ NSTEMI. Found to have severe prox/mid LAD dzs.  Now s/p PCI/DES x 2.  No chest pain overnight but developed hematoma this AM.  H/H down from yesterday.  Will  Keep today and f/u H/H and noncontrast CT to r/o RP bleed.  Cont asa, plavix,  blocker, and statin.  Potential d/c in AM if h/h stable.  2.  Essential HTN:  Stable on low dose  blocker.  3.  HL: LDL 117.  Cont high potency statin rx.  4.  H/o PE:  Chronic coumadin  held for cath.  Will have to continue to hold in setting of hematoma.  Once clear that there is no bleeding/H&H stable  plan to resume w/ triple therapy x 1 month and then d/c ASA @ the one month mark.  INR f/u with PCP.  5. R groin bleed/hematoma:  Per nsg, she c/o pain throughout the night related to skin tear and then this AM  she got up and developed hematoma.  H/H down to 9/29 form 11/36.  Checking noncontrast CT.  Will hold off on resuming coumadin for the time being until it's clear that there is no active bleeding.  Signed, Murray Hodgkins, NP  12/24/2017, 7:16 AM    For questions or updates, please contact   Please consult [email or web address] for contact info under Cardiology/STEMI.   As above, patient seen and examined.  Patient denies chest pain or dyspnea.  She has a right groin hematoma following PCI yesterday. Hgb decreased from 11.2 to 9.1.  We will hold on discharge this morning.  Check hemoglobin later today and tomorrow morning.  Continue aspirin and Plavix.  Resume Coumadin tomorrow if hemoglobin stable.  Kirk Ruths, MD

## 2017-12-24 NOTE — Progress Notes (Signed)
Bladder scanned at 125 cc. New order for Urinalysis done via in and out  Catheterization. Urine output via in and out at 170 cc's. Will update accordingly.

## 2017-12-24 NOTE — Progress Notes (Signed)
Pt is stable, groin with pain, but not as severe. Site soft hematoma.  She has not voided since 0400, will check bladder scan if + will do in and out cath.  If < 150 will apply suction cath - she did well with that last pm.    Last hgb is 9.2 CT 30.2  Will recheck   CT of abd reviewed with her.    H/H pending CBC ordered in AM as well.

## 2017-12-24 NOTE — Progress Notes (Signed)
  Echocardiogram 2D Echocardiogram has been performed.  Greidys Deland T Makinsey Pepitone 12/24/2017, 2:06 PM

## 2017-12-24 NOTE — Progress Notes (Signed)
CARDIAC REHAB PHASE I   [id number]  Ambulation deferred at this time while awaiting results of CT.  Education completed with pt and daughter including stent card, nutrition, ntg use, and restrictions.  Modified exercise guidelines to patient as unable to evaluate walk. PTA pt walking independently with no DME.  Pt did not have angioplasty and stent book already.  Found one and gave to patient in addition to heart attack book and handouts.  All questions answered. Verbalized understanding.    Nikki Domverett, Barret Esquivel G, RN 12/24/2017 11:05 AM

## 2017-12-25 DIAGNOSIS — Z86711 Personal history of pulmonary embolism: Secondary | ICD-10-CM

## 2017-12-25 DIAGNOSIS — D649 Anemia, unspecified: Secondary | ICD-10-CM | POA: Diagnosis present

## 2017-12-25 DIAGNOSIS — I2510 Atherosclerotic heart disease of native coronary artery without angina pectoris: Secondary | ICD-10-CM

## 2017-12-25 DIAGNOSIS — I10 Essential (primary) hypertension: Secondary | ICD-10-CM

## 2017-12-25 DIAGNOSIS — E785 Hyperlipidemia, unspecified: Secondary | ICD-10-CM

## 2017-12-25 LAB — CBC
HEMATOCRIT: 28.4 % — AB (ref 36.0–46.0)
Hemoglobin: 8.7 g/dL — ABNORMAL LOW (ref 12.0–15.0)
MCH: 25.3 pg — ABNORMAL LOW (ref 26.0–34.0)
MCHC: 30.6 g/dL (ref 30.0–36.0)
MCV: 82.6 fL (ref 78.0–100.0)
Platelets: 165 10*3/uL (ref 150–400)
RBC: 3.44 MIL/uL — ABNORMAL LOW (ref 3.87–5.11)
RDW: 16.7 % — ABNORMAL HIGH (ref 11.5–15.5)
WBC: 9.5 10*3/uL (ref 4.0–10.5)

## 2017-12-25 LAB — HEMOGLOBIN AND HEMATOCRIT, BLOOD
HEMATOCRIT: 28.2 % — AB (ref 36.0–46.0)
Hemoglobin: 8.7 g/dL — ABNORMAL LOW (ref 12.0–15.0)

## 2017-12-25 MED ORDER — CLOPIDOGREL BISULFATE 75 MG PO TABS: 75.0000 mg | ORAL_TABLET | Freq: Every day | ORAL | 3 refills | Status: DC

## 2017-12-25 MED ORDER — NITROGLYCERIN 0.4 MG SL SUBL: 0.4000 mg | SUBLINGUAL_TABLET | SUBLINGUAL | 0 refills | Status: DC | PRN

## 2017-12-25 MED ORDER — ASPIRIN 81 MG PO TBEC: DELAYED_RELEASE_TABLET | ORAL | 0 refills | Status: DC

## 2017-12-25 MED ORDER — WARFARIN SODIUM 5 MG PO TABS: 2.5000 mg | ORAL_TABLET | ORAL | Status: DC

## 2017-12-25 MED ORDER — ATORVASTATIN CALCIUM 80 MG PO TABS: 80.0000 mg | ORAL_TABLET | Freq: Every day | ORAL | 0 refills | Status: DC

## 2017-12-25 NOTE — Progress Notes (Signed)
    S: Re-evaluated pt this afternoon. She became clammy after walking but no other symptoms.  Has been in bed for the past few hours and c/o generalized malaise - 'just don't feel good.'  She denies chest pain or dyspnea.  O:   Vitals:   12/25/17 0510 12/25/17 0752  BP: 121/64 115/61  Pulse: 71 73  Resp:    Temp: 98.1 F (36.7 C)   SpO2: 94%   AAOx3.  Lungs CTA. Cor RRR. R groin tender but soft.  No bruit.  No change from this AM. RSR on tele  A/P:  1.  Generalized malaise:  S/p NSTEMI/PCI/R groin hematoma.  Walked some this AM but developed clamminess, which has since resolved.  Now c/o generalized malaise.  No c/p or dyspnea.  Afebrile. Hemodynamically stable.  UA neg yesterday.  Groin stable compared to this AM. F/u H/H now.  Plan to keep in the hospital today.  If she's feeling better later  ambulate again so long as H/H stable.  Nicolasa Duckinghristopher Berge, NP

## 2017-12-25 NOTE — Discharge Summary (Signed)
Discharge Summary    Patient ID: Alison Russo,  MRN: [id number], DOB/AGE: 12/23/1925 84 y.o.  Admit date: 12/22/2017 Discharge date: 12/25/2017  Primary Care Provider: Lavera Guise Primary Cardiologist: Lujean Amel  Discharge Diagnoses    Principal Problem:   NSTEMI (non-ST elevated myocardial infarction) [facility])  **s/p PCI/DES to the LAD x 2 this admission  Active Problems:   CAD (coronary artery disease)   Essential hypertension   Groin hematoma   Anemia  **d/c H/H 8.7/28.4   Hyperlipidemia   History of pulmonary embolism   Allergies No Known Allergies  Diagnostic Studies/Procedures    Percutaneous Coronary Intervention 4.5.2019  Diagnostic  Dominance: Left  Left Anterior Descending  Prox LAD lesion 95% stenosed with side branch in Ost 1st Diag 0% stenosed  Prox LAD lesion is 95% stenosed with non-stenotic side branch in Ost 1st Diag. Vessel is the culprit lesion. The lesion is type C, located at the bifurcation, focal, eccentric and irregular. The lesion was not previously treated.  Mid LAD lesion 80% stenosed  Mid LAD lesion is 80% stenosed. The lesion is focal. The lesion is mildly calcified.  Intervention   Prox LAD lesion with side branch in Ost 1st Diag  Stent - Main Branch  Lesion crossed with guidewire using a WIRE HI TORQ WHISPER MS 190CM. Pre-stent angioplasty was performed using a BALLOON SAPPHIRE 2.0X12. A drug-eluting stent was successfully placed using a STENT SIERRA 2.50 X 23 MM. Maximum pressure: 16 atm. Stent strut is well apposed. Post-stent angioplasty was not performed.  Post-Intervention Lesion Assessment  The intervention was successful. Pre-interventional TIMI flow is 3. Post-intervention TIMI flow is 3. No complications occurred at this lesion.  There is no residual stenosis in the main branch post intervention.  There is no residual stenosis in the side branch post intervention.  Mid LAD lesion  Stent  Lesion crossed with  guidewire using a WIRE HI TORQ WHISPER MS 190CM. Pre-stent angioplasty was performed using a BALLOON SAPPHIRE 2.0X12. A drug-eluting stent was successfully placed using a STENT SIERRA 2.25 X 12 MM. Maximum pressure: 14 atm. Stent strut is well apposed. Post-stent angioplasty was not performed.  Post-Intervention Lesion Assessment  The intervention was successful. Pre-interventional TIMI flow is 3. Post-intervention TIMI flow is 3. No complications occurred at this lesion.  There is no residual stenosis post intervention.  _____________  2D Echocardiogram 4.6.2019  Study Conclusions   - Left ventricle: The cavity size was normal. Wall thickness was   increased in a pattern of mild LVH. Systolic function was normal.   The estimated ejection fraction was in the range of 55% to 60%.   Wall motion was normal; there were no regional wall motion   abnormalities. Features are consistent with a pseudonormal left   ventricular filling pattern, with concomitant abnormal relaxation   and increased filling pressure (grade 2 diastolic dysfunction).   Doppler parameters are consistent with high ventricular filling   pressure. - Aortic valve: Valve mobility was restricted. There was mild   stenosis. Valve area (VTI): 1.58 cm^2. Valve area (Vmax): 1.54   cm^2. Valve area (Vmean): 1.41 cm^2. - Mitral valve: Calcified annulus. There was mild regurgitation. - Left atrium: The atrium was severely dilated. - Pulmonary arteries: Systolic pressure was mildly increased. _____________   History of Present Illness     84 year old female with a prior history of CAD, hypertension, hyperlipidemia, obesity, and prior pulmonary embolism on chronic Coumadin therapy, who was recently admitted to [facility]  regional in the setting of non-ST segment elevation myocardial infarction with a peak troponin of 2.6.  She underwent diagnostic catheterization on April 4 which showed complex LAD bifurcational disease.  It was felt  that patient would require PCI at a tertiary care center and she was transferred to [facility] for further evaluation.  Hospital Course     Consultants: None  Patient underwent successful PCI and drug-eluting stent placement to the proximal and mid LAD on December 23, 2017.  She tolerated the procedure well however post procedure, she developed a right groin hematoma after getting up to use the bathroom.  With this, she has significant right groin tenderness and mild ecchymosis.  Her hemoglobin and hematocrit dropped from 11.5 and 36 on admission to 9.1 and 29.0 on the morning of April 6.  In that setting, a CT of her abdomen and pelvis was performed and was negative for retroperitoneal bleed but did show moderate hemorrhage centered at the level of the right common femoral arterial access site and tracking inferiorly as outlined below.  In that setting, additional manual pressure was maintained and the patient was kept on bedrest.  Serial H/H were evaluated and remained stable.  Alison Russo remained hemodynamically stable and ambulated this AM without chest pain, dyspnea, or worsening of groin pain/re-development of hematoma.  In the setting of hematoma, we have held off on resuming her coumadin for the time being.  She will be discharged home today in good condition.  She will resume coumadin on 4/10, with a plan to have her remain on ASA, plavix, and coumadin x 30 days, at which point, ASA may be discontinued and she will remain on plavix and coumadin thereafter.  We have asked her to f/u with her PCP and primary cardiologist within the next 7 days. _____________  Discharge Vitals Blood pressure 115/61, pulse 73, temperature 98.1 F (36.7 C), temperature source Oral, resp. rate (!) 22, weight 209 lb 14.1 oz (95.2 kg), SpO2 94 %.  Filed Weights   12/23/17 0606 12/24/17 0336 12/25/17 0510  Weight: 200 lb 11.2 oz (91 kg) 202 lb 2.6 oz (91.7 kg) 209 lb 14.1 oz (95.2 kg)    Labs & Radiologic Studies     CBC Recent Labs    12/24/17 0327  12/24/17 1619 12/25/17 0330  WBC 9.8  --   --  9.5  HGB 9.1*   < > 8.6* 8.7*  HCT 29.0*   < > 27.9* 28.4*  MCV 82.6  --   --  82.6  PLT 180  --   --  165   < > = values in this interval not displayed.   Basic Metabolic Panel Recent Labs    12/23/17 0736 12/24/17 0327  NA 139 140  K 4.1 4.2  CL 109 110  CO2 23 21*  GLUCOSE 100* 124*  BUN 14 13  CREATININE 0.81 0.93  CALCIUM 9.0 8.8*   Fasting Lipid Panel Recent Labs    12/23/17 0737  CHOL 187  HDL 43  LDLCALC 117*  TRIG 135  CHOLHDL 4.3   _____________  Ct Abdomen Pelvis Wo Contrast  Result Date: 12/24/2017 CLINICAL DATA:  Status post cardiac catheterization on 12/21/2017 and PCI on 12/23/2017 via right femoral artery approach. Right groin pain and hematoma present. EXAM: CT ABDOMEN AND PELVIS WITHOUT CONTRAST TECHNIQUE: Multidetector CT imaging of the abdomen and pelvis was performed following the standard protocol without IV contrast. COMPARISON:  Prior CT of the abdomen and pelvis with  contrast on 05/25/2007 FINDINGS: Lower chest: Mild bibasilar atelectasis. Hepatobiliary: Unenhanced appearance of the liver is unremarkable. There is some vicarious excretion of contrast into the gallbladder lumen. There is no evidence of biliary ductal dilatation. Pancreas: Unenhanced appearance of the pancreas is unremarkable. Spleen: Unenhanced appearance of the spleen is unremarkable. Adrenals/Urinary Tract: Adrenal glands are unremarkable. There likely is a tiny amount of residual excreted contrast material in both renal collecting systems without hydronephrosis. Some contrast is also present in a decompressed bladder which demonstrates some mild wall thickening and trabeculation with probable small anterior and left lateral diverticula. Stomach/Bowel: Bowel shows no evidence of obstruction, ileus or obvious lesion. No free air identified. Vascular/Lymphatic: Hemorrhage is seen primarily centered around  the level of the right common femoral artery and extending into the anterior proximal right thigh in the extra muscular soft tissues and also superiorly in the right lateral retroperitoneal space around the distal iliac vessels of the pelvis. Hemorrhage does not extend into the proper abdomen. The abdominal aorta and iliac arteries are heavily calcified. No enlarged lymph nodes identified. Reproductive: Densely calcified degenerated fibroid is noted of the uterus. No adnexal masses identified. Other: No free fluid or hernias. Musculoskeletal: Bony structures are unremarkable. IMPRESSION: Hemorrhage primarily centered at the level of right common femoral arterial access and tracking inferiorly in the anterior right thigh and superiorly into the extraperitoneal space of the right lateral pelvis. Degree of hemorrhage is moderate and not large. Retroperitoneal extension does not extend into the true abdomen. These results will be called to the ordering clinician or representative by the Radiologist Assistant, and communication documented in the PACS or zVision Dashboard. Electronically Signed   By: Aletta Edouard M.D.   On: 12/24/2017 10:08   Dg Chest 1 View  Result Date: 12/20/2017 CLINICAL DATA:  Chest pain EXAM: CHEST  1 VIEW COMPARISON:  05/23/2007 FINDINGS: Cardiac shadow is mildly enlarged. Aortic calcifications are again seen. Some increased density is noted in the medial aspect of the right apex increased from the prior exam. Previously these changes were related to prominent mediastinal soft tissues it would be difficult to exclude an underlying infiltrate. Mild interstitial changes of a chronic nature are seen. No focal infiltrate or sizable effusion is noted. IMPRESSION: Increased density in the right apex which may represent an underlying infiltrate. Electronically Signed   By: Inez Catalina M.D.   On: 12/20/2017 21:56   Disposition   Pt is being discharged home today in good condition.  Follow-up  Plans & Appointments    Follow-up Information    Lavera Guise, MD Follow up in 1 week(s).   Specialty:  Internal Medicine Why:  f/u INR (coumadin level) Contact information: [address] [id number] [phone]        Lujean Amel D, MD Follow up in 1 week(s).   Specialties:  Cardiology, Internal Medicine Contact information: [place]  [address] [phone]          Discharge Instructions    Amb Referral to Cardiac Rehabilitation   Complete by:  As directed    Diagnosis:   Coronary Stents PTCA NSTEMI        Discharge Medications   Allergies as of 12/25/2017   No Known Allergies     Medication List    STOP taking these medications   lisinopril 5 MG tablet Commonly known as:  PRINIVIL,ZESTRIL     TAKE these medications   acetaminophen 500 MG tablet Commonly known as:  TYLENOL Take 1,000 mg by mouth 2 (two) times daily as needed (for pain/headache/fever).   aspirin 81 MG EC tablet 1 tab daily x 30 days only   atorvastatin 80 MG tablet Commonly known as:  LIPITOR Take 1 tablet (80 mg total) by mouth daily at 6 PM.   CALCIUM PO Take 1,200 mg by mouth daily.   clopidogrel 75 MG tablet Commonly known as:  PLAVIX Take 1 tablet (75 mg total) by mouth daily with breakfast.   Cyanocobalamin 2500 MCG Subl Place 2,500 mcg under the tongue daily.   FERROUSUL 325 (65 FE) MG tablet Generic drug:  ferrous sulfate Take 325 mg by mouth daily.   loratadine 10 MG tablet Commonly known as:  CLARITIN Take 10 mg by mouth daily.   metoprolol tartrate 12.5 mg Tabs tablet Commonly known as:  LOPRESSOR Take 12.5 mg by mouth 2 (two) times daily.   nitroGLYCERIN 0.4 MG SL tablet Commonly known as:  NITROSTAT Place 1 tablet (0.4 mg total) under the tongue every 5 (five) minutes x 3 doses as needed for chest pain.   OPCON-A OP Place 1-2 drops into both eyes 3 (three) times daily as needed (for itchy/irritated eyes.).    PRESERVISION AREDS 2+MULTI VIT PO Take 1 tablet by mouth 2 (two) times daily.   sertraline 100 MG tablet Commonly known as:  ZOLOFT Take 100 mg by mouth daily.   traZODone 50 MG tablet Commonly known as:  DESYREL Take 0.5 tablets (25 mg total) by mouth at bedtime as needed for sleep.   vitamin C 500 MG tablet Commonly known as:  ASCORBIC ACID Take 500 mg by mouth daily.   Vitamin D 2000 units tablet Take 2,000 Units by mouth daily.   warfarin 5 MG tablet Commonly known as:  COUMADIN ** RESUME ON Wednesday 12/28/2017** 2.83m by mouth daily on Sun, Tues, Thurs, Sat - 574mby mouth daily on Mon, Wed, Fri       Aspirin prescribed at discharge?  Yes High Intensity Statin Prescribed? (Lipitor 40-8063mr Crestor 20-104m27mYes Beta Blocker Prescribed? Yes For EF <40%, was ACEI/ARB Prescribed? No: N/A ADP Receptor Inhibitor Prescribed? (i.e. Plavix etc.-Includes Medically Managed Patients): Yes For EF <40%, Aldosterone Inhibitor Prescribed? No: N/A Was EF assessed during THIS hospitalization? Yes Was Cardiac Rehab II ordered? (Included Medically managed Patients): Yes   Outstanding Labs/Studies   F/U INR with PCP next week.  Duration of Discharge Encounter   Greater than 30 minutes including physician time.  Signed, ChriMurray Hodgkins4/03/2018, 9:06 AM

## 2017-12-25 NOTE — Discharge Instructions (Signed)
**  PLEASE REMEMBER TO BRING ALL OF YOUR MEDICATIONS TO EACH OF YOUR FOLLOW-UP OFFICE VISITS. ° °NO HEAVY LIFTING X 2 WEEKS. °NO SEXUAL ACTIVITY X 2 WEEKS. °NO DRIVING X 1 WEEK. °NO SOAKING BATHS, HOT TUBS, POOLS, ETC., X 7 DAYS. ° °Groin Site Care °Refer to this sheet in the next few weeks. These instructions provide you with information on caring for yourself after your procedure. Your caregiver may also give you more specific instructions. Your treatment has been planned according to current medical practices, but problems sometimes occur. Call your caregiver if you have any problems or questions after your procedure. °HOME CARE INSTRUCTIONS °· You may shower 24 hours after the procedure. Remove the bandage (dressing) and gently wash the site with plain soap and water. Gently pat the site dry.  °· Do not apply powder or lotion to the site.  °· Do not sit in a bathtub, swimming pool, or whirlpool for 5 to 7 days.  °· No bending, squatting, or lifting anything over 10 pounds (4.5 kg) as directed by your caregiver.  °· Inspect the site at least twice daily.  °· Do not drive home if you are discharged the same day of the procedure. Have someone else drive you.  ° °What to expect: °· Any bruising will usually fade within 1 to 2 weeks.  °· Blood that collects in the tissue (hematoma) may be painful to the touch. It should usually decrease in size and tenderness within 1 to 2 weeks.  °SEEK IMMEDIATE MEDICAL CARE IF: °· You have unusual pain at the groin site or down the affected leg.  °· You have redness, warmth, swelling, or pain at the groin site.  °· You have drainage (other than a small amount of blood on the dressing).  °· You have chills.  °· You have a fever or persistent symptoms for more than 72 hours.  °· You have a fever and your symptoms suddenly get worse.  °· Your leg becomes pale, cool, tingly, or numb.  °You have heavy bleeding from the site. Hold pressure on the site. . ° °  ° °10 Habits of Highly Healthy  People ° °[facility] wants to help you get well and stay well.  Live a longer, healthier life by practicing healthy habits every day. ° °1.  Visit your primary care provider regularly. °2.  Make time for family and friends.  Healthy relationships are important. °3.  Take medications as directed by your provider. °4.  Maintain a healthy weight and a trim waistline. °5.  Eat healthy meals and snacks, rich in fruits, vegetables, whole grains, and lean proteins. °6.  Get moving every day - aim for 150 minutes of moderate physical activity each week. °7.  Don't smoke. °8.  Avoid alcohol or drink in moderation. °9.  Manage stress through meditation or mindful relaxation. °10.  Get seven to nine hours of quality sleep each night. ° °Want more information on healthy habits?  To learn more about these and other healthy habits, visit [email or web address]. °_____________ °  °  °

## 2017-12-25 NOTE — Progress Notes (Addendum)
Progress Note  Patient Name: Alison Russo Date of Encounter: 12/25/2017  Primary Cardiologist: Edd Arbour  Subjective   No chest pain or sob.  R groin remains tender.  Hasn't been out of bed yet.  H/H relatively stable.  Inpatient Medications    Scheduled Meds: . aspirin EC  81 mg Oral Daily  . atorvastatin  80 mg Oral q1800  . clopidogrel  75 mg Oral Q breakfast  . ferrous sulfate  325 mg Oral Daily  . metoprolol tartrate  12.5 mg Oral BID  . sertraline  100 mg Oral Daily  . sodium chloride flush  3 mL Intravenous Q12H   Continuous Infusions: . sodium chloride     PRN Meds: sodium chloride, acetaminophen, acetaminophen, morphine injection, nitroGLYCERIN, ondansetron (ZOFRAN) IV, sodium chloride flush   Vital Signs    Vitals:   12/24/17 1426 12/24/17 2006 12/24/17 2106 12/25/17 0510  BP: (!) 97/57 106/73 (!) 100/51 121/64  Pulse: 68 70  71  Resp:      Temp: 98.6 F (37 C) 98.5 F (36.9 C)  98.1 F (36.7 C)  TempSrc: Oral Oral  Oral  SpO2: 96% 94%  94%  Weight:    209 lb 14.1 oz (95.2 kg)    Intake/Output Summary (Last 24 hours) at 12/25/2017 0710 Last data filed at 12/25/2017 0542 Gross per 24 hour  Intake 243 ml  Output 1025 ml  Net -782 ml   Filed Weights   12/23/17 0606 12/24/17 0336 12/25/17 0510  Weight: 200 lb 11.2 oz (91 kg) 202 lb 2.6 oz (91.7 kg) 209 lb 14.1 oz (95.2 kg)    Physical Exam   GEN: Well nourished, well developed, in no acute distress.  HEENT: Grossly normal.  Neck: Supple, no JVD, carotid bruits, or masses. Cardiac: RRR, 2/6 syst murmur throughout, no rubs, or gallops. No clubbing, cyanosis, edema.  Radials/DP/PT 2+ and equal bilaterally. R groin ecchymotic and tender extending from stick site to medial thigh.  No bruit/hematoma/bleeding. No flank pain. Respiratory:  Respirations regular and unlabored, diminished breath sounds in left base. GI: Soft, nontender, nondistended, BS + x 4. MS: no deformity or atrophy. Skin:  warm and dry, no rash. Neuro:  Strength and sensation are intact. Psych: AAOx3.  Normal affect.  Labs    Chemistry Recent Labs  Lab 12/21/17 0501 12/23/17 0736 12/24/17 0327  NA 141 139 140  K 4.3 4.1 4.2  CL 110 109 110  CO2 26 23 21*  GLUCOSE 91 100* 124*  BUN 22* 14 13  CREATININE 0.81 0.81 0.93  CALCIUM 9.0 9.0 8.8*  GFRNONAA >60 >60 53*  GFRAA >60 >60 >60  ANIONGAP '5 7 9     ' Hematology Recent Labs  Lab 12/23/17 0736 12/24/17 0327 12/24/17 1243 12/24/17 1619 12/25/17 0330  WBC 8.5 9.8  --   --  9.5  RBC 4.42 3.51*  --   --  3.44*  HGB 11.2* 9.1* 9.2* 8.6* 8.7*  HCT 36.7 29.0* 30.2* 27.9* 28.4*  MCV 83.0 82.6  --   --  82.6  MCH 25.3* 25.9*  --   --  25.3*  MCHC 30.5 31.4  --   --  30.6  RDW 16.0* 16.6*  --   --  16.7*  PLT 184 180  --   --  165    Cardiac Enzymes Recent Labs  Lab 12/20/17 2134 12/21/17 0156 12/21/17 0901 12/21/17 1744  TROPONINI 0.06* 2.38* 3.76* 2.02*     BNP  Recent Labs  Lab 12/20/17 2135  BNP 155.0*      Radiology    Ct Abdomen Pelvis Wo Contrast  Result Date: 12/24/2017 CLINICAL DATA:  Status post cardiac catheterization on 12/21/2017 and PCI on 12/23/2017 via right femoral artery approach. Right groin pain and hematoma present. EXAM: CT ABDOMEN AND PELVIS WITHOUT CONTRAST TECHNIQUE: Multidetector CT imaging of the abdomen and pelvis was performed following the standard protocol without IV contrast. COMPARISON:  Prior CT of the abdomen and pelvis with contrast on 05/25/2007 FINDINGS: Lower chest: Mild bibasilar atelectasis. Hepatobiliary: Unenhanced appearance of the liver is unremarkable. There is some vicarious excretion of contrast into the gallbladder lumen. There is no evidence of biliary ductal dilatation. Pancreas: Unenhanced appearance of the pancreas is unremarkable. Spleen: Unenhanced appearance of the spleen is unremarkable. Adrenals/Urinary Tract: Adrenal glands are unremarkable. There likely is a tiny amount of  residual excreted contrast material in both renal collecting systems without hydronephrosis. Some contrast is also present in a decompressed bladder which demonstrates some mild wall thickening and trabeculation with probable small anterior and left lateral diverticula. Stomach/Bowel: Bowel shows no evidence of obstruction, ileus or obvious lesion. No free air identified. Vascular/Lymphatic: Hemorrhage is seen primarily centered around the level of the right common femoral artery and extending into the anterior proximal right thigh in the extra muscular soft tissues and also superiorly in the right lateral retroperitoneal space around the distal iliac vessels of the pelvis. Hemorrhage does not extend into the proper abdomen. The abdominal aorta and iliac arteries are heavily calcified. No enlarged lymph nodes identified. Reproductive: Densely calcified degenerated fibroid is noted of the uterus. No adnexal masses identified. Other: No free fluid or hernias. Musculoskeletal: Bony structures are unremarkable. IMPRESSION: Hemorrhage primarily centered at the level of right common femoral arterial access and tracking inferiorly in the anterior right thigh and superiorly into the extraperitoneal space of the right lateral pelvis. Degree of hemorrhage is moderate and not large. Retroperitoneal extension does not extend into the true abdomen. These results will be called to the ordering clinician or representative by the Radiologist Assistant, and communication documented in the PACS or zVision Dashboard. Electronically Signed   By: Aletta Edouard M.D.   On: 12/24/2017 10:08    Telemetry    RSR - Personally Reviewed  Cardiac Studies   Cardiac Catheterization and Percutaneous Coronary Intervention 4.5.2019  Diagnostic  Dominance: Left  Left Anterior Descending  Prox LAD lesion 95% stenosed with side branch in Ost 1st Diag 0% stenosed  Prox LAD lesion is 95% stenosed with non-stenotic side branch in Ost 1st  Diag. Vessel is the culprit lesion. The lesion is type C, located at the bifurcation, focal, eccentric and irregular. The lesion was not previously treated.  Mid LAD lesion 80% stenosed  Mid LAD lesion is 80% stenosed. The lesion is focal. The lesion is mildly calcified.  Intervention   Prox LAD lesion with side branch in Ost 1st Diag  Stent - Main Branch  Lesion crossed with guidewire using a WIRE HI TORQ WHISPER MS 190CM. Pre-stent angioplasty was performed using a BALLOON SAPPHIRE 2.0X12. A drug-eluting stent was successfully placed using a STENT SIERRA 2.50 X 23 MM. Maximum pressure: 16 atm. Stent strut is well apposed. Post-stent angioplasty was not performed.  Post-Intervention Lesion Assessment  The intervention was successful. Pre-interventional TIMI flow is 3. Post-intervention TIMI flow is 3. No complications occurred at this lesion.  There is no residual stenosis in the main branch post intervention.  There  is no residual stenosis in the side branch post intervention.  Mid LAD lesion  Stent  Lesion crossed with guidewire using a WIRE HI TORQ WHISPER MS 190CM. Pre-stent angioplasty was performed using a BALLOON SAPPHIRE 2.0X12. A drug-eluting stent was successfully placed using a STENT SIERRA 2.25 X 12 MM. Maximum pressure: 14 atm. Stent strut is well apposed. Post-stent angioplasty was not performed.  Post-Intervention Lesion Assessment  The intervention was successful. Pre-interventional TIMI flow is 3. Post-intervention TIMI flow is 3. No complications occurred at this lesion.  There is no residual stenosis post intervention.  _____________  2D Echocardiogram 4.6.2019  Study Conclusions  - Left ventricle: The cavity size was normal. Wall thickness was   increased in a pattern of mild LVH. Systolic function was normal.   The estimated ejection fraction was in the range of 55% to 60%.   Wall motion was normal; there were no regional wall motion   abnormalities. Features are  consistent with a pseudonormal left   ventricular filling pattern, with concomitant abnormal relaxation   and increased filling pressure (grade 2 diastolic dysfunction).   Doppler parameters are consistent with high ventricular filling   pressure. - Aortic valve: Valve mobility was restricted. There was mild   stenosis. Valve area (VTI): 1.58 cm^2. Valve area (Vmax): 1.54   cm^2. Valve area (Vmean): 1.41 cm^2. - Mitral valve: Calcified annulus. There was mild regurgitation. - Left atrium: The atrium was severely dilated. - Pulmonary arteries: Systolic pressure was mildly increased. _____________   Patient Profile     84 y.o. female w/ a h/o CAD, HTN, OSA, and PE, who was admitted on tx from [facility] following presentation there with NSTEMI and severe prox/mid LAD dzs  now s/p PCI/DES x 2.  Assessment & Plan    1.  NSTEMI/CAD:  S/p presentation to [facility] w/ NSTEMI.  Found to have sev prox/mid LAD dzs.  Now s/p PCI/DES x 2 on 4/5.  No c/p or sob overnight.  Hasn't yet ambulated 2/2 R groin hematoma.  Cardiac rehab to see this AM.  Ambulate and probable d/c if stable.  Cont asa, plavix,  blocker, and statin.  F/u with Dr. Clayborn Bigness Jefm Bryant) within the next 7-10 days.  2.  Essential HTN:  Stable on  blocker.  3.  HL:  LDL 117.  Cont high potency statin Rx.  4.  H/o PE:  PE ~ 6 yrs ago per pt.  Chronic coumadin  currently on hold in setting of recent cath and subsequent R groin hematoma.  Groin and H/H stable this AM. Probably resume coumadin tomorrow.  5.  R Groin bleed/hematoma:  Developed post-cath/pci early on 4/6.  CT yesterday w/o RP bleed.  H/H stable  8.7/28.4.  Groin is tender and ecchymotic but soft.  No bruit.  Ambulate this AM.  Probably resume coumadin tomorrow.  Will need cardiology f/u as above.  Signed, Murray Hodgkins, NP  12/25/2017, 7:10 AM    For questions or updates, please contact   Please consult [email or web address] for contact info under Cardiology/STEMI. As above,  patient seen and examined.  She denies chest pain or dyspnea.  She does have a hematoma at cath site but there is no bruit.  Hemoglobin is stable today.  We will ambulate and discharge later today.  Continue aspirin and Plavix.  I will hold Coumadin until Wednesday and then resume at prior dose.  Would continue triple therapy for 1 month and then discontinue aspirin thereafter and continue plavix and coumadin long  term. >30 min PA and physician time D2 Kirk Ruths, MD

## 2017-12-25 NOTE — Progress Notes (Signed)
Pt ambulated 200 ft on RA, rolling walker, hand held assist, mostly steady gait, tolerated well with no complaints. BP stable, groin site stable. Pt to recliner after walk, call Silbernagel within reach. Will continue to monitor.  Joylene GrapesEmily C Mickaela Starlin, RN, BSN

## 2017-12-25 NOTE — Progress Notes (Signed)
Pt ambulated 150 ft on RA, rolling walker, assist x1, slow, mostly steady gait, tolerated fair. Slow to stand, c/o right groin tenderness/soreness upon standing. Pt c/o fatigue upon return to room, feeling "clammy." BP stable. R groin site assessed, no change. Pt weak, required assistance, lives alone. Will ambulate again prior to potential discharge.  Joylene GrapesEmily C Stefani Baik, RN, BSN

## 2017-12-26 DIAGNOSIS — E7849 Other hyperlipidemia: Secondary | ICD-10-CM

## 2017-12-26 LAB — CBC
HEMATOCRIT: 29.2 % — AB (ref 36.0–46.0)
Hemoglobin: 9.1 g/dL — ABNORMAL LOW (ref 12.0–15.0)
MCH: 26.5 pg (ref 26.0–34.0)
MCHC: 31.2 g/dL (ref 30.0–36.0)
MCV: 84.9 fL (ref 78.0–100.0)
Platelets: 196 10*3/uL (ref 150–400)
RBC: 3.44 MIL/uL — AB (ref 3.87–5.11)
RDW: 17 % — AB (ref 11.5–15.5)
WBC: 9.5 10*3/uL (ref 4.0–10.5)

## 2017-12-26 LAB — BASIC METABOLIC PANEL
ANION GAP: 10 (ref 5–15)
BUN: 23 mg/dL — ABNORMAL HIGH (ref 6–20)
CALCIUM: 8.8 mg/dL — AB (ref 8.9–10.3)
CO2: 23 mmol/L (ref 22–32)
Chloride: 107 mmol/L (ref 101–111)
Creatinine, Ser: 0.87 mg/dL (ref 0.44–1.00)
GFR, EST NON AFRICAN AMERICAN: 57 mL/min — AB (ref >60)
Glucose, Bld: 132 mg/dL — ABNORMAL HIGH (ref 65–99)
POTASSIUM: 3.9 mmol/L (ref 3.5–5.1)
Sodium: 140 mmol/L (ref 135–145)

## 2017-12-26 MED ORDER — WARFARIN SODIUM 5 MG PO TABS: 6.0000 mg | ORAL_TABLET | Freq: Once | ORAL | Status: DC

## 2017-12-26 MED ORDER — WARFARIN SODIUM 5 MG PO TABS: 5.0000 mg | ORAL_TABLET | ORAL | Status: DC

## 2017-12-26 MED ORDER — WARFARIN - PHYSICIAN DOSING INPATIENT: Freq: Every day | Status: DC

## 2017-12-26 MED ORDER — PANTOPRAZOLE SODIUM 40 MG PO TBEC: 40.0000 mg | DELAYED_RELEASE_TABLET | Freq: Every day | ORAL | 0 refills | Status: DC

## 2017-12-26 MED ORDER — WARFARIN SODIUM 5 MG PO TABS
6.0000 mg | ORAL_TABLET | Freq: Once | ORAL | Status: AC
  Administered 2017-12-26: 11:00:00 6 mg via ORAL
  Filled 2017-12-26: qty 1

## 2017-12-26 MED FILL — Heparin Sodium (Porcine) 2 Unit/ML in Sodium Chloride 0.9%: INTRAMUSCULAR | Qty: 1000 | Status: AC

## 2017-12-26 NOTE — Progress Notes (Signed)
Reviewed discharge paperwork, educated about new medications and no further questions. Pt discharging to home.

## 2017-12-26 NOTE — Discharge Summary (Signed)
No change from yesterday except below.   Nausea: Could be due to ASA, Plavix or Iron. Stop Iron. Start taking Plavix in evening. Add Protonix. If PM nausea after Plavix, may need to switch to Brilinta (can't use Effient at her age) although would place at increased bleeding risk.  Anticoagulation: She takes coumadin for chronic PE. She will get warfarin 6mg  prior to discharge. Resume home dose tomorrow and INR check with PCP in few days.   Unsteady gait: She deferred PT evaluation. Will lives with son. She has walker at home. She will discussed with PCP later this week.

## 2017-12-26 NOTE — Care Management Important Message (Signed)
Important Message  Patient Details  Name: Alison Russo MRN: [id number] Date of Birth: 30-Jan-1926   Medicare Important Message Given:  Yes    [name] P Jousha Schwandt 12/26/2017, 2:57 PM

## 2017-12-26 NOTE — Care Management Note (Signed)
Case Management Note  Patient Details  Name: Charlott RakesKatherine L Walkup MRN: [id number] Date of Birth: [birth year]  Subjective/Objective: Pt presented for Nstemi- PTA from home with the support of daughter. Per pt her daughter will be able to stay with her once transitioned home.                   Action/Plan: Pt stated she would not need HH Services at this time. Pt is aware to contact PCP once she gets home and [facility]. No further needs from CM at this time.   Expected Discharge Date:  12/26/17               Expected Discharge Plan:  Home/Self Care  In-House Referral:  NA  Discharge planning Services  CM Consult  Post Acute Care Choice:  NA Choice offered to:  NA  DME Arranged:  N/A DME Agency:  NA  HH Arranged:  NA HH Agency:  NA  Status of Service:  Completed, signed off  If discussed at Long Length of Stay Meetings, dates discussed:    Additional Comments:  Gala LewandowskyGraves-Bigelow, Merla Sawka Kaye, RN 12/26/2017, 11:55 AM

## 2017-12-26 NOTE — Progress Notes (Signed)
[id number] Assisted pt from bathroom and offered to walk. She stated she would like to lie down as she was nauseated. Assisted to bed and vitals taken.  67 SR, 132/70 and sats at 97%RA. Discussed with pt that iron can make one nauseated. Stressed importance of plavix with stent. Son in room. Asked pt if she had walker for home as she was using one in room. Son stated that they have one available if needed that they can borrow.  Cardiology PA in room. Luetta NuttingCharlene Koehn Salehi RN BSN 12/26/2017 10:35 AM

## 2017-12-26 NOTE — Progress Notes (Addendum)
The patient has been seen in conjunction with Vin Bhagat, PAC. All aspects of care have been considered and discussed. The patient has been personally interviewed, examined, and all clinical data has been reviewed.   Having nausea after taking medication each morning which is new since admission. Plan switch Plavix to PM dosing, stop Iron, and resume coumadin today with usual home dosing. Give dose of coumadin today(INR 4-5 days hence). Nausea may be from ASA or Iron. Also possible it is plavix. If PM nausea after Plavix, may need to switch to Brilinta (can't use Effient at her age) although would place at increased bleeding risk.  Right groin swollen and tender. No edema.  No chest pain.  Hemoglobin and kidney function stable.  Discharge today.  Progress Note  Patient Name: Alison Russo Date of Encounter: 12/26/2017  Primary Cardiologist: Lujean Amel  Subjective   Discharge held yesterday due to fatigue. She walked 237f yesterday afterwards and walking in room this morning. Now feeling better.   Inpatient Medications    Scheduled Meds: . aspirin EC  81 mg Oral Daily  . atorvastatin  80 mg Oral q1800  . clopidogrel  75 mg Oral Q breakfast  . ferrous sulfate  325 mg Oral Daily  . metoprolol tartrate  12.5 mg Oral BID  . sertraline  100 mg Oral Daily  . sodium chloride flush  3 mL Intravenous Q12H   Continuous Infusions: . sodium chloride     PRN Meds: sodium chloride, acetaminophen, acetaminophen, morphine injection, nitroGLYCERIN, ondansetron (ZOFRAN) IV, sodium chloride flush   Vital Signs    Vitals:   12/25/17 1429 12/25/17 2117 12/26/17 0435 12/26/17 0817  BP: (!) 101/51 130/69 116/65 117/69  Pulse: 79 77 66   Resp:      Temp:  98.3 F (36.8 C) 98.1 F (36.7 C)   TempSrc:  Oral Oral   SpO2: (!) 75% 95% 94%   Weight:   200 lb 9.6 oz (91 kg)     Intake/Output Summary (Last 24 hours) at 12/26/2017 0845 Last data filed at 12/25/2017 2017 Gross per 24  hour  Intake 3 ml  Output -  Net 3 ml   Filed Weights   12/24/17 0336 12/25/17 0510 12/26/17 0435  Weight: 202 lb 2.6 oz (91.7 kg) 209 lb 14.1 oz (95.2 kg) 200 lb 9.6 oz (91 kg)    Telemetry    Sr - Personally Reviewed  ECG    NA- Personally Reviewed  Physical Exam   GEN: No acute distress.   Neck: No JVD Cardiac: RRR, 2/6 systolic  murmurs, rubs, or gallops. R groin ecchymotic and tender extending from stick site to medial thigh. Respiratory: Clear to auscultation bilaterally. GI: Soft, nontender, non-distended  MS: No edema; No deformity. Neuro:  Nonfocal  Psych: Normal affect   Labs    Chemistry Recent Labs  Lab 12/23/17 0736 12/24/17 0327 12/26/17 0710  NA 139 140 140  K 4.1 4.2 3.9  CL 109 110 107  CO2 23 21* 23  GLUCOSE 100* 124* 132*  BUN 14 13 23*  CREATININE 0.81 0.93 0.87  CALCIUM 9.0 8.8* 8.8*  GFRNONAA >60 53* 57*  GFRAA >60 >60 >60  ANIONGAP '7 9 10     ' Hematology Recent Labs  Lab 12/24/17 0327  12/25/17 0330 12/25/17 1145 12/26/17 0710  WBC 9.8  --  9.5  --  9.5  RBC 3.51*  --  3.44*  --  3.44*  HGB 9.1*   < >  8.7* 8.7* 9.1*  HCT 29.0*   < > 28.4* 28.2* 29.2*  MCV 82.6  --  82.6  --  84.9  MCH 25.9*  --  25.3*  --  26.5  MCHC 31.4  --  30.6  --  31.2  RDW 16.6*  --  16.7*  --  17.0*  PLT 180  --  165  --  196   < > = values in this interval not displayed.    Cardiac Enzymes Recent Labs  Lab 12/20/17 2134 12/21/17 0156 12/21/17 0901 12/21/17 1744  TROPONINI 0.06* 2.38* 3.76* 2.02*   No results for input(s): TROPIPOC in the last 168 hours.   BNP Recent Labs  Lab 12/20/17 2135  BNP 155.0*     DDimer No results for input(s): DDIMER in the last 168 hours.   Radiology    Ct Abdomen Pelvis Wo Contrast  Result Date: 12/24/2017 CLINICAL DATA:  Status post cardiac catheterization on 12/21/2017 and PCI on 12/23/2017 via right femoral artery approach. Right groin pain and hematoma present. EXAM: CT ABDOMEN AND PELVIS WITHOUT  CONTRAST TECHNIQUE: Multidetector CT imaging of the abdomen and pelvis was performed following the standard protocol without IV contrast. COMPARISON:  Prior CT of the abdomen and pelvis with contrast on 05/25/2007 FINDINGS: Lower chest: Mild bibasilar atelectasis. Hepatobiliary: Unenhanced appearance of the liver is unremarkable. There is some vicarious excretion of contrast into the gallbladder lumen. There is no evidence of biliary ductal dilatation. Pancreas: Unenhanced appearance of the pancreas is unremarkable. Spleen: Unenhanced appearance of the spleen is unremarkable. Adrenals/Urinary Tract: Adrenal glands are unremarkable. There likely is a tiny amount of residual excreted contrast material in both renal collecting systems without hydronephrosis. Some contrast is also present in a decompressed bladder which demonstrates some mild wall thickening and trabeculation with probable small anterior and left lateral diverticula. Stomach/Bowel: Bowel shows no evidence of obstruction, ileus or obvious lesion. No free air identified. Vascular/Lymphatic: Hemorrhage is seen primarily centered around the level of the right common femoral artery and extending into the anterior proximal right thigh in the extra muscular soft tissues and also superiorly in the right lateral retroperitoneal space around the distal iliac vessels of the pelvis. Hemorrhage does not extend into the proper abdomen. The abdominal aorta and iliac arteries are heavily calcified. No enlarged lymph nodes identified. Reproductive: Densely calcified degenerated fibroid is noted of the uterus. No adnexal masses identified. Other: No free fluid or hernias. Musculoskeletal: Bony structures are unremarkable. IMPRESSION: Hemorrhage primarily centered at the level of right common femoral arterial access and tracking inferiorly in the anterior right thigh and superiorly into the extraperitoneal space of the right lateral pelvis. Degree of hemorrhage is  moderate and not large. Retroperitoneal extension does not extend into the true abdomen. These results will be called to the ordering clinician or representative by the Radiologist Assistant, and communication documented in the PACS or zVision Dashboard. Electronically Signed   By: Aletta Edouard M.D.   On: 12/24/2017 10:08    Cardiac Studies   Cardiac Catheterization and Percutaneous Coronary Intervention4.5.2019  Diagnostic  Dominance: Left  Left Anterior Descending  Prox LAD lesion 95% stenosed with side branch in Ost 1st Diag 0% stenosed  Prox LAD lesion is 95% stenosed with non-stenotic side branch in Ost 1st Diag. Vessel is the culprit lesion. The lesion is type C, located at the bifurcation, focal, eccentric and irregular. The lesion was not previously treated.  Mid LAD lesion 80% stenosed  Mid LAD lesion is  80% stenosed. The lesion is focal. The lesion is mildly calcified.  Intervention   Prox LAD lesion with side branch in Ost 1st Diag  Stent - Main Branch  Lesion crossed with guidewire using a WIRE HI TORQ WHISPER MS 190CM. Pre-stent angioplasty was performed using a BALLOON SAPPHIRE 2.0X12. A drug-eluting stent was successfully placed using a STENT SIERRA 2.50 X 23 MM. Maximum pressure: 16 atm. Stent strut is well apposed. Post-stent angioplasty was not performed.  Post-Intervention Lesion Assessment  The intervention was successful. Pre-interventional TIMI flow is 3. Post-intervention TIMI flow is 3. No complications occurred at this lesion.  There is no residual stenosis in the main branch post intervention.  There is no residual stenosis in the side branch post intervention.  Mid LAD lesion  Stent  Lesion crossed with guidewire using a WIRE HI TORQ WHISPER MS 190CM. Pre-stent angioplasty was performed using a BALLOON SAPPHIRE 2.0X12. A drug-eluting stent was successfully placed using a STENT SIERRA 2.25 X 12 MM. Maximum pressure: 14 atm. Stent strut is well apposed.  Post-stent angioplasty was not performed.  Post-Intervention Lesion Assessment  The intervention was successful. Pre-interventional TIMI flow is 3. Post-intervention TIMI flow is 3. No complications occurred at this lesion.  There is no residual stenosis post intervention.  _____________  2D Echocardiogram 4.6.2019  Study Conclusions  - Left ventricle: The cavity size was normal. Wall thickness was increased in a pattern of mild LVH. Systolic function was normal. The estimated ejection fraction was in the range of 55% to 60%. Wall motion was normal; there were no regional wall motion abnormalities. Features are consistent with a pseudonormal left ventricular filling pattern, with concomitant abnormal relaxation and increased filling pressure (grade 2 diastolic dysfunction). Doppler parameters are consistent with high ventricular filling pressure. - Aortic valve: Valve mobility was restricted. There was mild stenosis. Valve area (VTI): 1.58 cm^2. Valve area (Vmax): 1.54 cm^2. Valve area (Vmean): 1.41 cm^2. - Mitral valve: Calcified annulus. There was mild regurgitation. - Left atrium: The atrium was severely dilated. - Pulmonary arteries: Systolic pressure was mildly increased. _____________     Patient Profile     84 y.o.femalew/ a h/o CAD, HTN, OSA, and PE, who was admitted on tx from [facility] following presentation there with NSTEMI and severe prox/mid LAD dzsnow s/p PCI/DES x 2.  Assessment & Plan    1. NSTEMI - S/p DES x 2 to LAD. No chest pain. Continue Asa, plavix, ? blocker, and statin.    2. R groin hematoma -Groin is tender and ecchymotic but soft.  No bruit. H & H improving.   3. Hx of PE 6 years ago - On chronic coumadin.   She will resume coumadin on 4/10, with a plan to have her remain on ASA, plavix, and coumadin x 30 days, at which point, ASA may be discontinued and she will remain on plavix and coumadin thereafter.  We have asked her  to f/u with her PCP in few days and  with Dr. Clayborn Bigness Jefm Bryant) within the next 7-10 days.  Ambulated and discharge later today. She will lives with her son. No change in discharge summary.     For questions or updates, please contact [place] Please consult [email or web address] for contact info under Cardiology/STEMI.      Jarrett Soho, PA  12/26/2017, 8:45 AM

## 2017-12-28 ENCOUNTER — Ambulatory Visit (INDEPENDENT_AMBULATORY_CARE_PROVIDER_SITE_OTHER): Payer: PPO | Admitting: Internal Medicine

## 2017-12-28 ENCOUNTER — Encounter: Payer: Self-pay | Admitting: Internal Medicine

## 2017-12-28 VITALS — BP 136/70 | HR 70 | Resp 16 | Ht 66.0 in | Wt 202.2 lb

## 2017-12-28 DIAGNOSIS — I2510 Atherosclerotic heart disease of native coronary artery without angina pectoris: Secondary | ICD-10-CM

## 2017-12-28 DIAGNOSIS — I481 Persistent atrial fibrillation: Secondary | ICD-10-CM

## 2017-12-28 DIAGNOSIS — R000 Tachycardia, unspecified: Secondary | ICD-10-CM | POA: Insufficient documentation

## 2017-12-28 DIAGNOSIS — Z9861 Coronary angioplasty status: Secondary | ICD-10-CM | POA: Diagnosis not present

## 2017-12-28 DIAGNOSIS — I4819 Other persistent atrial fibrillation: Secondary | ICD-10-CM

## 2017-12-28 DIAGNOSIS — G4733 Obstructive sleep apnea (adult) (pediatric): Secondary | ICD-10-CM | POA: Diagnosis not present

## 2017-12-28 DIAGNOSIS — I25118 Atherosclerotic heart disease of native coronary artery with other forms of angina pectoris: Secondary | ICD-10-CM

## 2017-12-28 DIAGNOSIS — R011 Cardiac murmur, unspecified: Secondary | ICD-10-CM | POA: Insufficient documentation

## 2017-12-28 DIAGNOSIS — I4891 Unspecified atrial fibrillation: Secondary | ICD-10-CM | POA: Insufficient documentation

## 2017-12-28 DIAGNOSIS — Z7901 Long term (current) use of anticoagulants: Secondary | ICD-10-CM

## 2017-12-28 LAB — POCT INR: INR: 1.3

## 2017-12-28 MED ORDER — WARFARIN SODIUM 5 MG PO TABS: 5.0000 mg | ORAL_TABLET | ORAL | 3 refills | Status: DC

## 2017-12-28 NOTE — Progress Notes (Signed)
[facility] [address] [place], [place] [address]  Internal MEDICINE  Office Visit Note  Patient Name: Alison Russo  [id number]  [id number]  Date of Service: 12/28/2017     Chief Complaint  Patient presents with  . stent placement    hospital follow up     HPI Pt is here for recent hospital follow up.  Current Medication: Outpatient Encounter Medications as of 12/28/2017  Medication Sig  . acetaminophen (TYLENOL) 500 MG tablet Take 1,000 mg by mouth 2 (two) times daily as needed (for pain/headache/fever).  Marland Kitchen aspirin EC 81 MG EC tablet 1 tab daily x 30 days only  . atorvastatin (LIPITOR) 80 MG tablet Take 1 tablet (80 mg total) by mouth daily at 6 PM.  . CALCIUM PO Take 1,200 mg by mouth daily.  . Cholecalciferol (VITAMIN D) 2000 units tablet Take 2,000 Units by mouth daily.  . clopidogrel (PLAVIX) 75 MG tablet Take 1 tablet (75 mg total) by mouth daily with breakfast.  . Cyanocobalamin 2500 MCG SUBL Place 2,500 mcg under the tongue daily.  Marland Kitchen loratadine (CLARITIN) 10 MG tablet Take 10 mg by mouth daily.  . metoprolol tartrate (LOPRESSOR) 12.5 mg TABS tablet Take 12.5 mg by mouth 2 (two) times daily.  . Multiple Vitamins-Minerals (PRESERVISION AREDS 2+MULTI VIT PO) Take 1 tablet by mouth 2 (two) times daily.  . Naphazoline-Pheniramine (OPCON-A OP) Place 1-2 drops into both eyes 3 (three) times daily as needed (for itchy/irritated eyes.).  Marland Kitchen nitroGLYCERIN (NITROSTAT) 0.4 MG SL tablet Place 1 tablet (0.4 mg total) under the tongue every 5 (five) minutes x 3 doses as needed for chest pain.  . pantoprazole (PROTONIX) 40 MG tablet Take 1 tablet (40 mg total) by mouth daily.  . sertraline (ZOLOFT) 100 MG tablet Take 100 mg by mouth daily.  . vitamin C (ASCORBIC ACID) 500 MG tablet Take 500 mg by mouth daily.  Marland Kitchen warfarin (COUMADIN) 5 MG tablet Take 0.5-1 tablets (2.5-5 mg total) by mouth See admin instructions. ** RESUME ON Wednesday 12/28/2017** 2.5mg  by mouth daily  on Sun, Tues, Thurs, Sat - 5mg  by mouth daily on Mon, Wed, Fri  . traZODone (DESYREL) 50 MG tablet Take 0.5 tablets (25 mg total) by mouth at bedtime as needed for sleep. (Patient not taking: Reported on 12/28/2017)   No facility-administered encounter medications on file as of 12/28/2017.     Surgical History: Past Surgical History:  Procedure Laterality Date  . BREAST CYST ASPIRATION Right    neg  . CARDIAC CATHETERIZATION    . CATARACT EXTRACTION, BILATERAL    . COLONOSCOPY    . CORONARY STENT INTERVENTION N/A 12/23/2017   Procedure: CORONARY STENT INTERVENTION;  Surgeon: Swaziland, Peter M, MD;  Location: [facility] INVASIVE CV LAB;  Service: Cardiovascular;  Laterality: N/A;  . EYE SURGERY Left    "blood vessel busted behind my eye"  . EYE SURGERY Right    ?vitrectomy  . HYSTEROSCOPY W/D&C N/A 10/25/2016   Procedure: DILATATION AND CURETTAGE /HYSTEROSCOPY, POLYPECTOMY;  Surgeon: Christeen Douglas, MD;  Location: [facility] ORS;  Service: Gynecology;  Laterality: N/A;  . INGUINAL HERNIA REPAIR Right 1950s  . LEFT HEART CATH AND CORONARY ANGIOGRAPHY N/A 12/21/2017   Procedure: LEFT HEART CATH AND CORONARY ANGIOGRAPHY possible PCI and stent;  Surgeon: Alwyn Pea, MD;  Location: [facility] INVASIVE CV LAB;  Service: Cardiovascular;  Laterality: N/A;  . TONSILLECTOMY      Medical History: Past Medical History:  Diagnosis Date  . Anginal pain (  HCC)   . Anxiety   . Depression   . Dyspnea   . Heart murmur   . Hypertension   . NSTEMI (non-ST elevated myocardial infarction) (HCC) 12/22/2017  . OSA on CPAP   . PE (pulmonary embolism)     Family History: Family History  Problem Relation Age of Onset  . Breast cancer Mother 83    Social History   Socioeconomic History  . Marital status: Widowed    Spouse name: Not on file  . Number of children: Not on file  . Years of education: Not on file  . Highest education level: Not on file  Occupational History  . Not on file  Social Needs  .  Financial resource strain: Not on file  . Food insecurity:    Worry: Not on file    Inability: Not on file  . Transportation needs:    Medical: Not on file    Non-medical: Not on file  Tobacco Use  . Smoking status: Never Smoker  . Smokeless tobacco: Never Used  Substance and Sexual Activity  . Alcohol use: Never    Frequency: Never  . Drug use: Never  . Sexual activity: Not Currently  Lifestyle  . Physical activity:    Days per week: Not on file    Minutes per session: Not on file  . Stress: Not on file  Relationships  . Social connections:    Talks on phone: Not on file    Gets together: Not on file    Attends religious service: Not on file    Active member of club or organization: Not on file    Attends meetings of clubs or organizations: Not on file    Relationship status: Not on file  . Intimate partner violence:    Fear of current or ex partner: Not on file    Emotionally abused: Not on file    Physically abused: Not on file    Forced sexual activity: Not on file  Other Topics Concern  . Not on file  Social History Narrative  . Not on file    Review of Systems  Constitutional: Negative for chills, diaphoresis and fatigue.  HENT: Negative for ear pain, postnasal drip and sinus pressure.   Eyes: Negative for photophobia, discharge, redness, itching and visual disturbance.  Respiratory: Negative for cough, shortness of breath and wheezing.   Cardiovascular: Negative for chest pain, palpitations and leg swelling.  Gastrointestinal: Negative for abdominal pain, constipation, diarrhea, nausea and vomiting.  Genitourinary: Negative for dysuria and flank pain.  Musculoskeletal: Negative for arthralgias, back pain, gait problem and neck pain.  Skin: Negative for color change.  Allergic/Immunologic: Negative for environmental allergies and food allergies.  Neurological: Negative for dizziness and headaches.  Hematological: Does not bruise/bleed easily.   Psychiatric/Behavioral: Negative for agitation, behavioral problems (depression) and hallucinations.    Vital Signs: BP 136/70 (BP Location: Left Arm, Patient Position: Sitting)   Pulse 70   Resp 16   Ht 5\' 6"  (1.676 m)   Wt 202 lb 3.2 oz (91.7 kg)   SpO2 97%   BMI 32.64 kg/m    Physical Exam  Constitutional: She is oriented to person, place, and time. She appears well-developed and well-nourished. No distress.  HENT:  Head: Normocephalic and atraumatic.  Mouth/Throat: Oropharynx is clear and moist. No oropharyngeal exudate.  Eyes: Pupils are equal, round, and reactive to light. EOM are normal.  Neck: Normal range of motion. Neck supple. No JVD present. No tracheal  deviation present. No thyromegaly present.  Cardiovascular: Normal rate, regular rhythm and normal heart sounds. Exam reveals no gallop and no friction rub.  No murmur heard. Pulmonary/Chest: Effort normal. No respiratory distress. She has no wheezes. She has no rales. She exhibits no tenderness.  Abdominal: Soft. Bowel sounds are normal.  Musculoskeletal: Normal range of motion.  Lymphadenopathy:    She has no cervical adenopathy.  Neurological: She is alert and oriented to person, place, and time. No cranial nerve deficit.  Skin: Skin is warm and dry. She is not diaphoretic.  Psychiatric: She has a normal mood and affect. Her behavior is normal. Judgment and thought content normal.   Assessment/Plan: 1. Persistent atrial fibrillation (HCC) -  Continue Lopressor as before. POCT INR  2. Obstructive sleep apnea syndrome - Home sleep test; Future. Pt has CPAP, machine is not working  3. Coronary artery disease of native artery of native heart with stable angina pectoris [facility](HCC) - Per Cardiology  4. CAD S/P percutaneous coronary angioplasty - Continue Coumadin and plavix both per cardio  5. Chronic anticoagulation - Continue coumadin   General Counseling: Carolynn SayersKatherine verbalizes understanding of the findings of  todays visit and agrees with plan of treatment. I have discussed any further diagnostic evaluation that may be needed or ordered today. We also reviewed her medications today. she has been encouraged to call the office with any questions or concerns that should arise related to todays visit.   I have reviewed all medical records from hospital follow up including radiology reports and consults from other physicians. Appropriate follow up diagnostics will be scheduled as needed. Patient/ Family understands the plan of treatment  Time spent 25 minutes.   Dr Lyndon CodeFozia M Guthrie Lemme, MD Internal Medicine

## 2018-01-02 ENCOUNTER — Telehealth: Payer: Self-pay

## 2018-01-02 NOTE — Telephone Encounter (Signed)
Called patient to see if she was interested in scheduling a home sleep test; patient advised that she recently had a heart attack 12/20/17 and would consider it once she's feeling better.Tat

## 2018-01-03 ENCOUNTER — Ambulatory Visit
Admission: RE | Admit: 2018-01-03 | Discharge: 2018-01-03 | Disposition: A | Discharge status: Home / Self Care | Payer: PPO | Source: Ambulatory Visit | Attending: Internal Medicine | Admitting: Internal Medicine

## 2018-01-03 ENCOUNTER — Other Ambulatory Visit: Payer: Self-pay | Admitting: Internal Medicine

## 2018-01-03 DIAGNOSIS — N9489 Other specified conditions associated with female genital organs and menstrual cycle: Secondary | ICD-10-CM | POA: Insufficient documentation

## 2018-01-03 DIAGNOSIS — Z955 Presence of coronary angioplasty implant and graft: Secondary | ICD-10-CM | POA: Diagnosis not present

## 2018-01-03 DIAGNOSIS — M7981 Nontraumatic hematoma of soft tissue: Secondary | ICD-10-CM | POA: Diagnosis not present

## 2018-01-03 DIAGNOSIS — I729 Aneurysm of unspecified site: Secondary | ICD-10-CM | POA: Diagnosis not present

## 2018-01-03 DIAGNOSIS — I480 Paroxysmal atrial fibrillation: Secondary | ICD-10-CM | POA: Diagnosis not present

## 2018-01-03 DIAGNOSIS — R011 Cardiac murmur, unspecified: Secondary | ICD-10-CM | POA: Diagnosis not present

## 2018-01-03 DIAGNOSIS — I209 Angina pectoris, unspecified: Secondary | ICD-10-CM | POA: Diagnosis not present

## 2018-01-03 DIAGNOSIS — I10 Essential (primary) hypertension: Secondary | ICD-10-CM | POA: Diagnosis not present

## 2018-01-03 DIAGNOSIS — R000 Tachycardia, unspecified: Secondary | ICD-10-CM | POA: Diagnosis not present

## 2018-01-03 DIAGNOSIS — I214 Non-ST elevation (NSTEMI) myocardial infarction: Secondary | ICD-10-CM | POA: Diagnosis not present

## 2018-01-09 ENCOUNTER — Encounter (INDEPENDENT_AMBULATORY_CARE_PROVIDER_SITE_OTHER): Payer: Self-pay | Admitting: Vascular Surgery

## 2018-01-09 ENCOUNTER — Ambulatory Visit (INDEPENDENT_AMBULATORY_CARE_PROVIDER_SITE_OTHER): Payer: PPO | Admitting: Vascular Surgery

## 2018-01-09 VITALS — BP 132/77 | HR 62 | Resp 17 | Ht 64.5 in | Wt 204.0 lb

## 2018-01-09 DIAGNOSIS — M79604 Pain in right leg: Secondary | ICD-10-CM

## 2018-01-09 DIAGNOSIS — I10 Essential (primary) hypertension: Secondary | ICD-10-CM | POA: Diagnosis not present

## 2018-01-09 DIAGNOSIS — M79606 Pain in leg, unspecified: Secondary | ICD-10-CM | POA: Insufficient documentation

## 2018-01-09 DIAGNOSIS — S301XXA Contusion of abdominal wall, initial encounter: Secondary | ICD-10-CM

## 2018-01-09 DIAGNOSIS — I214 Non-ST elevation (NSTEMI) myocardial infarction: Secondary | ICD-10-CM | POA: Diagnosis not present

## 2018-01-09 DIAGNOSIS — R600 Localized edema: Secondary | ICD-10-CM

## 2018-01-09 NOTE — Progress Notes (Signed)
MRN : [id number]  Alison Russo is a 84 y.o. (25-Aug-1926) female who presents with chief complaint of  Chief Complaint  Patient presents with  . New Patient (Initial Visit)    Hematoma  .  History of Present Illness: The patient is sent to me by Dr. Vennie Homans for evaluation of right leg pain and swelling.  She was recently admitted to [facility] on April 2.  At that time she presented to the emergency room with retrosternal chest pain diaphoresis and shortness of breath.  She subsequently was transferred to [facility] where she underwent PCI with a drug-eluting stent to the LAD x2 on April 4.  Postoperatively she was noted to have a hematoma.  She was discharged on April 8 at which time she was noted to have a significant hematoma of the right groin.  She was discharged home.  She followed up with Dr. Vennie Homans and noted increasing pain.  On evaluation today the patient is noting extreme tightness of her entire right lower extremity.  She notes that she has not been elevating.  She gives dozens of excuses as to why she cannot "sit around and put her feet up".   Current Meds  Medication Sig  . acetaminophen (TYLENOL) 500 MG tablet Take 1,000 mg by mouth 2 (two) times daily as needed (for pain/headache/fever).  Marland Kitchen aspirin EC 81 MG EC tablet 1 tab daily x 30 days only  . atorvastatin (LIPITOR) 80 MG tablet Take 1 tablet (80 mg total) by mouth daily at 6 PM.  . CALCIUM PO Take 1,200 mg by mouth daily.  . Cholecalciferol (VITAMIN D) 2000 units tablet Take 2,000 Units by mouth daily.  . clopidogrel (PLAVIX) 75 MG tablet Take 1 tablet (75 mg total) by mouth daily with breakfast.  . Cyanocobalamin 2500 MCG SUBL Place 2,500 mcg under the tongue daily.  Marland Kitchen loratadine (CLARITIN) 10 MG tablet Take 10 mg by mouth daily.  . metoprolol tartrate (LOPRESSOR) 12.5 mg TABS tablet Take 12.5 mg by mouth 2 (two) times daily.  . Multiple Vitamins-Minerals (PRESERVISION AREDS 2+MULTI  VIT PO) Take 1 tablet by mouth 2 (two) times daily.  . Naphazoline-Pheniramine (OPCON-A OP) Place 1-2 drops into both eyes 3 (three) times daily as needed (for itchy/irritated eyes.).  Marland Kitchen nitroGLYCERIN (NITROSTAT) 0.4 MG SL tablet Place 1 tablet (0.4 mg total) under the tongue every 5 (five) minutes x 3 doses as needed for chest pain.  . pantoprazole (PROTONIX) 40 MG tablet Take 1 tablet (40 mg total) by mouth daily.  . sertraline (ZOLOFT) 100 MG tablet Take 100 mg by mouth daily.  . traZODone (DESYREL) 50 MG tablet Take 0.5 tablets (25 mg total) by mouth at bedtime as needed for sleep.  . vitamin C (ASCORBIC ACID) 500 MG tablet Take 500 mg by mouth daily.  Marland Kitchen warfarin (COUMADIN) 5 MG tablet Take 1 tablet (5 mg total) by mouth See admin instructions. Take one tab po qd    Past Medical History:  Diagnosis Date  . Anginal pain (HCC)   . Anxiety   . Depression   . Dyspnea   . Heart murmur   . Hypertension   . NSTEMI (non-ST elevated myocardial infarction) (HCC) 12/22/2017  . OSA on CPAP   . PE (pulmonary embolism)     Past Surgical History:  Procedure Laterality Date  . BREAST CYST ASPIRATION Right    neg  . CARDIAC CATHETERIZATION    . CATARACT EXTRACTION, BILATERAL    .  COLONOSCOPY    . CORONARY STENT INTERVENTION N/A 12/23/2017   Procedure: CORONARY STENT INTERVENTION;  Surgeon: SwazilandJordan, Peter M, MD;  [facility] LAB;  Service: Cardiovascular;  Laterality: N/A;  . EYE SURGERY Left    "blood vessel busted behind my eye"  . EYE SURGERY Right    ?vitrectomy  . HYSTEROSCOPY W/D&C N/A 10/25/2016   Procedure: DILATATION AND CURETTAGE /HYSTEROSCOPY, POLYPECTOMY;  Surgeon: Christeen DouglasBethany Beasley, MD;  Location: [facility] ORS;  Service: Gynecology;  Laterality: N/A;  . INGUINAL HERNIA REPAIR Right 1950s  . LEFT HEART CATH AND CORONARY ANGIOGRAPHY N/A 12/21/2017   Procedure: LEFT HEART CATH AND CORONARY ANGIOGRAPHY possible PCI and stent;  Surgeon: Alwyn Peaallwood, Dwayne D, MD;  Location: [facility]  INVASIVE CV LAB;  Service: Cardiovascular;  Laterality: N/A;  . TONSILLECTOMY      Social History Social History   Tobacco Use  . Smoking status: Never Smoker  . Smokeless tobacco: Never Used  Substance Use Topics  . Alcohol use: Never    Frequency: Never  . Drug use: Never    Family History Family History  Problem Relation Age of Onset  . Breast cancer Mother 100  No family history of bleeding/clotting disorders, porphyria or autoimmune disease   No Known Allergies   REVIEW OF SYSTEMS (Negative unless checked)  Constitutional: [] Weight loss  [] Fever  [] Chills Cardiac: [x] Chest pain   [] Chest pressure   [] Palpitations   [] Shortness of breath when laying flat   [] Shortness of breath with exertion. Vascular:  [] Pain in legs with walking   [x] Pain in legs at rest  [] History of DVT   [] Phlebitis   [x] Swelling in legs   [] Varicose veins   [] Non-healing ulcers Pulmonary:   [] Uses home oxygen   [] Productive cough   [] Hemoptysis   [] Wheeze  [] COPD   [] Asthma Neurologic:  [] Dizziness   [] Seizures   [] History of stroke   [] History of TIA  [] Aphasia   [] Vissual changes   [] Weakness or numbness in arm   [] Weakness or numbness in leg Musculoskeletal:   [] Joint swelling   [] Joint pain   [] Low back pain Hematologic:  [] Easy bruising  [] Easy bleeding   [] Hypercoagulable state   [] Anemic Gastrointestinal:  [] Diarrhea   [] Vomiting  [] Gastroesophageal reflux/heartburn   [] Difficulty swallowing. Genitourinary:  [] Chronic kidney disease   [] Difficult urination  [] Frequent urination   [] Blood in urine Skin:  [] Rashes   [] Ulcers  Psychological:  [] History of anxiety   []  History of major depression.  Physical Examination  Vitals:   01/09/18 1102  BP: 132/77  Pulse: 62  Resp: 17  Weight: 204 lb (92.5 kg)  Height: 5' 4.5" (1.638 m)   Body mass index is 34.48 kg/m. Gen: WD/WN, NAD Head: [place]/AT, No temporalis wasting.  Ear/Nose/Throat: Hearing grossly intact, nares w/o erythema or drainage,  poor dentition Eyes: PER, EOMI, sclera nonicteric.  Neck: Supple, no masses.  No bruit or JVD.  Pulmonary:  Good air movement, clear to auscultation bilaterally, no use of accessory muscles.  Cardiac: RRR, normal S1, S2, no Murmurs. Vascular:   Mild venous stasis changes to the legs bilaterally.  4+ hard pitting edema Vessel Right Left  Radial Palpable Palpable  PT Palpable Palpable  DP Palpable Palpable  Gastrointestinal: soft, non-distended. No guarding/no peritoneal signs.  Musculoskeletal: M/S 5/5 throughout.  No deformity or atrophy.  Neurologic: CN 2-12 intact. Pain and light touch intact in extremities.  Symmetrical.  Speech is fluent. Motor exam as listed above. Psychiatric: Judgment intact, Mood &  affect appropriate for pt's clinical situation. Dermatologic: No rashes or ulcers noted.  No changes consistent with cellulitis. Lymph : No Cervical lymphadenopathy, no lichenification or skin changes of chronic lymphedema.  CBC Lab Results  Component Value Date   WBC 9.5 12/26/2017   HGB 9.1 (L) 12/26/2017   HCT 29.2 (L) 12/26/2017   MCV 84.9 12/26/2017   PLT 196 12/26/2017    BMET    Component Value Date/Time   NA 140 12/26/2017 0710   NA 143 09/28/2017 1259   K 3.9 12/26/2017 0710   CL 107 12/26/2017 0710   CO2 23 12/26/2017 0710   GLUCOSE 132 (H) 12/26/2017 0710   BUN 23 (H) 12/26/2017 0710   BUN 16 09/28/2017 1259   CREATININE 0.87 12/26/2017 0710   CALCIUM 8.8 (L) 12/26/2017 0710   GFRNONAA 57 (L) 12/26/2017 0710   GFRAA >60 12/26/2017 0710   Estimated Creatinine Clearance: 47.8 mL/min (by C-G formula based on SCr of 0.87 mg/dL).  COAG Lab Results  Component Value Date   INR 1.3 15.3 12/28/2017   INR 1.43 12/24/2017   INR 1.49 12/23/2017    Radiology Ct Abdomen Pelvis Wo Contrast  Result Date: 12/24/2017 CLINICAL DATA:  Status post cardiac catheterization on 12/21/2017 and PCI on 12/23/2017 via right femoral artery approach. Right groin pain and hematoma  present. EXAM: CT ABDOMEN AND PELVIS WITHOUT CONTRAST TECHNIQUE: Multidetector CT imaging of the abdomen and pelvis was performed following the standard protocol without IV contrast. COMPARISON:  Prior CT of the abdomen and pelvis with contrast on 05/25/2007 FINDINGS: Lower chest: Mild bibasilar atelectasis. Hepatobiliary: Unenhanced appearance of the liver is unremarkable. There is some vicarious excretion of contrast into the gallbladder lumen. There is no evidence of biliary ductal dilatation. Pancreas: Unenhanced appearance of the pancreas is unremarkable. Spleen: Unenhanced appearance of the spleen is unremarkable. Adrenals/Urinary Tract: Adrenal glands are unremarkable. There likely is a tiny amount of residual excreted contrast material in both renal collecting systems without hydronephrosis. Some contrast is also present in a decompressed bladder which demonstrates some mild wall thickening and trabeculation with probable small anterior and left lateral diverticula. Stomach/Bowel: Bowel shows no evidence of obstruction, ileus or obvious lesion. No free air identified. Vascular/Lymphatic: Hemorrhage is seen primarily centered around the level of the right common femoral artery and extending into the anterior proximal right thigh in the extra muscular soft tissues and also superiorly in the right lateral retroperitoneal space around the distal iliac vessels of the pelvis. Hemorrhage does not extend into the proper abdomen. The abdominal aorta and iliac arteries are heavily calcified. No enlarged lymph nodes identified. Reproductive: Densely calcified degenerated fibroid is noted of the uterus. No adnexal masses identified. Other: No free fluid or hernias. Musculoskeletal: Bony structures are unremarkable. IMPRESSION: Hemorrhage primarily centered at the level of right common femoral arterial access and tracking inferiorly in the anterior right thigh and superiorly into the extraperitoneal space of the right  lateral pelvis. Degree of hemorrhage is moderate and not large. Retroperitoneal extension does not extend into the true abdomen. These results will be called to the ordering clinician or representative by the Radiologist Assistant, and communication documented in the PACS or zVision Dashboard. Electronically Signed   By: Irish Lack M.D.   On: 12/24/2017 10:08   Dg Chest 1 View  Result Date: 12/20/2017 CLINICAL DATA:  Chest pain EXAM: CHEST  1 VIEW COMPARISON:  05/23/2007 FINDINGS: Cardiac shadow is mildly enlarged. Aortic calcifications are again seen. Some increased density is  noted in the medial aspect of the right apex increased from the prior exam. Previously these changes were related to prominent mediastinal soft tissues it would be difficult to exclude an underlying infiltrate. Mild interstitial changes of a chronic nature are seen. No focal infiltrate or sizable effusion is noted. IMPRESSION: Increased density in the right apex which may represent an underlying infiltrate. Electronically Signed   By: Alcide Clever M.D.   On: 12/20/2017 21:56   [place] Lower Ext Art Right Ltd  Result Date: 01/03/2018 CLINICAL DATA:  Recent heart catheterization with right groin pain. EXAM: RIGHT LOWER EXTREMITY ARTERIAL DUPLEX SCAN - LIMITED TECHNIQUE: Gray-scale sonography as well as color Doppler and duplex ultrasound was performed to evaluate the lower extremity at the area of concern COMPARISON:  None. FINDINGS: Right common femoral artery is patent. Right common femoral vein is patent. Complex heterogeneous collection anterior to the right femoral vessels. This structure measures 7.5 x 3.6 x 9.6 cm. The findings are most compatible with a hematoma. No evidence for a pseudoaneurysm. IMPRESSION: Hematoma in the right groin.  No evidence for a pseudoaneurysm. Right common femoral artery is patent. Electronically Signed   By: Richarda Overlie M.D.   On: 01/03/2018 13:43     Assessment/Plan 1. Pain of right lower  extremity I have had a long discussion with the patient regarding swelling and why it is related to the hematoma  Behavioral modification will be initiated and was absolutely stressed.  This will include frequent elevation, use of over the counter pain medications and exercise such as walking.  Graduated compression was also discussed.  Consideration for a lymph pump will also be made based upon the effectiveness of conservative therapy.  This would help to improve the edema control and prevent sequela such as ulcers and infections   Patient should undergo duplex ultrasound of the venous system to ensure that DVT or reflux is not present.  The patient will follow-up with me after the ultrasound.    A total of 60 minutes was spent with this patient and greater than 50% was spent in counseling and coordination of care with the patient.  Discussion included the treatment options for vascular disease including indications for surgery and intervention.  Also discussed is the appropriate timing of treatment.  In addition medical therapy was discussed.  - VAS [place] LOWER EXTREMITY VENOUS (DVT); Future  2. Edema of right lower extremity See #1  3. Hematoma of groin, initial encounter Will check duplex to ensure pseudoaneurysm is not present  4. NSTEMI (non-ST elevated myocardial infarction) (HCC) Continue cardiac and antihypertensive medications as already ordered and reviewed, no changes at this time.  Continue statin as ordered and reviewed, no changes at this time  Nitrates PRN for chest pain   5. Essential hypertension Continue antihypertensive medications as already ordered, these medications have been reviewed and there are no changes at this time.     Levora Dredge, MD  01/09/2018 8:11 PM

## 2018-01-10 ENCOUNTER — Encounter (INDEPENDENT_AMBULATORY_CARE_PROVIDER_SITE_OTHER): Payer: Self-pay | Admitting: Vascular Surgery

## 2018-01-10 ENCOUNTER — Ambulatory Visit (INDEPENDENT_AMBULATORY_CARE_PROVIDER_SITE_OTHER): Payer: PPO

## 2018-01-10 ENCOUNTER — Encounter (INDEPENDENT_AMBULATORY_CARE_PROVIDER_SITE_OTHER): Payer: Self-pay

## 2018-01-10 ENCOUNTER — Ambulatory Visit (INDEPENDENT_AMBULATORY_CARE_PROVIDER_SITE_OTHER): Payer: PPO | Admitting: Vascular Surgery

## 2018-01-10 ENCOUNTER — Other Ambulatory Visit (INDEPENDENT_AMBULATORY_CARE_PROVIDER_SITE_OTHER): Payer: Self-pay | Admitting: Vascular Surgery

## 2018-01-10 VITALS — BP 140/80 | HR 68 | Resp 16 | Ht 64.5 in | Wt 204.0 lb

## 2018-01-10 DIAGNOSIS — M79604 Pain in right leg: Secondary | ICD-10-CM | POA: Diagnosis not present

## 2018-01-10 DIAGNOSIS — S301XXD Contusion of abdominal wall, subsequent encounter: Secondary | ICD-10-CM

## 2018-01-10 DIAGNOSIS — R600 Localized edema: Secondary | ICD-10-CM

## 2018-01-10 MED ORDER — CEFAZOLIN SODIUM-DEXTROSE 2-4 GM/100ML-% IV SOLN
2.0000 g | Freq: Once | INTRAVENOUS | Status: AC
  Administered 2018-01-11: 2 g via INTRAVENOUS

## 2018-01-10 NOTE — Progress Notes (Signed)
Subjective:    Patient ID: Alison Russo, female    DOB: 04/14/1926, 84 y.o.   MRN: [id number] Chief Complaint  Patient presents with  . 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  The patient was last seen yesterday - January 09, 2018 for evaluation of a right groin hematoma. She was recently admitted to [facility] on December 20, 2017.  At that time, she presented to the emergency room with retrosternal chest pain, diaphoresis and shortness of breath. She subsequently was transferred to [facility] where she underwent PCI with a drug-eluting stent to the LAD x2 on December 22, 2017.  Postoperatively she was noted to have a hematoma.  The patient underwent a right lower extremity arterial ultrasound on 01/03/18 which was notable for a hematoma measuring 7.5cm x 3.6cm x 9.6cm.  No evidence of pseudoaneurysm was mentioned.  The patient continued to experience right groin pain referred to our office.  The patient underwent a lower extremity venous DVT study which was notable for Right: Limited evaluation of the right lower extremity deep venous system.  Arterial flow is noted within the known hematoma of the right groin to just above possible pseudoaneurysm.  Left: No evidence of deep vein thrombosis in the lower extremity.  No indirect evidence of obstruction proximal to the inguinal ligament.  The patient denies any shortness of breath or chest pain.  The patient denies any fever, nausea or vomiting.  The patient has a three layer zinc oxide unna wrap to the right lower extremity on.   Review of Systems  Constitutional: Negative.   HENT: Negative.   Eyes: Negative.   Respiratory: Negative.   Cardiovascular: Positive for leg swelling.       Right groin pain Right groin hematoma  Gastrointestinal: Negative.   Endocrine: Negative.   Genitourinary: Negative.   Musculoskeletal: Negative.   Skin: Negative.   Allergic/Immunologic: Negative.     Neurological: Negative.   Hematological: Negative.   Psychiatric/Behavioral: Negative.       Objective:   Physical Exam  Constitutional: She is oriented to person, place, and time. She appears well-developed and well-nourished. No distress.  HENT:  Head: Normocephalic and atraumatic.  Right Ear: External ear normal.  Left Ear: External ear normal.  Eyes: Pupils are equal, round, and reactive to light. Conjunctivae and EOM are normal.  Neck: Normal range of motion.  Cardiovascular: Normal rate, regular rhythm and normal heart sounds.  Pulmonary/Chest: Effort normal.  Musculoskeletal: Normal range of motion. She exhibits edema (Right lower extremity: 3 layer zinc oxide Unna wrap is on.).  Neurological: She is alert and oriented to person, place, and time.  Skin: Skin is warm and dry. She is not diaphoretic.  Right groin: Tender hematoma noted.  Skin is intact.  Psychiatric: She has a normal mood and affect. Her behavior is normal. Judgment and thought content normal.  Vitals reviewed.  BP 140/80 (BP Location: Right Arm)   Pulse 68   Resp 16   Ht 5' 4.5" (1.638 m)   Wt 204 lb (92.5 kg)   BMI 34.48 kg/m   Past Medical History:  Diagnosis Date  . Anginal pain (HCC)   . Anxiety   . Depression   . Dyspnea   . Heart murmur   . Hypertension   . NSTEMI (non-ST elevated myocardial infarction) (HCC) 12/22/2017  . OSA on CPAP   . PE (pulmonary embolism)    Social  History   Socioeconomic History  . Marital status: Widowed    Spouse name: Not on file  . Number of children: Not on file  . Years of education: Not on file  . Highest education level: Not on file  Occupational History  . Not on file  Social Needs  . Financial resource strain: Not on file  . Food insecurity:    Worry: Not on file    Inability: Not on file  . Transportation needs:    Medical: Not on file    Non-medical: Not on file  Tobacco Use  . Smoking status: Never Smoker  . Smokeless tobacco: Never  Used  Substance and Sexual Activity  . Alcohol use: Never    Frequency: Never  . Drug use: Never  . Sexual activity: Not Currently  Lifestyle  . Physical activity:    Days per week: Not on file    Minutes per session: Not on file  . Stress: Not on file  Relationships  . Social connections:    Talks on phone: Not on file    Gets together: Not on file    Attends religious service: Not on file    Active member of club or organization: Not on file    Attends meetings of clubs or organizations: Not on file    Relationship status: Not on file  . Intimate partner violence:    Fear of current or ex partner: Not on file    Emotionally abused: Not on file    Physically abused: Not on file    Forced sexual activity: Not on file  Other Topics Concern  . Not on file  Social History Narrative  . Not on file   Past Surgical History:  Procedure Laterality Date  . BREAST CYST ASPIRATION Right    neg  . CARDIAC CATHETERIZATION    . CATARACT EXTRACTION, BILATERAL    . COLONOSCOPY    . CORONARY STENT INTERVENTION N/A 12/23/2017   Procedure: CORONARY STENT INTERVENTION;  Surgeon: SwazilandJordan, Peter M, MD;  [facility] LAB;  Service: Cardiovascular;  Laterality: N/A;  . EYE SURGERY Left    "blood vessel busted behind my eye"  . EYE SURGERY Right    ?vitrectomy  . HYSTEROSCOPY W/D&C N/A 10/25/2016   Procedure: DILATATION AND CURETTAGE /HYSTEROSCOPY, POLYPECTOMY;  Surgeon: Christeen DouglasBethany Beasley, MD;  Location: [facility] ORS;  Service: Gynecology;  Laterality: N/A;  . INGUINAL HERNIA REPAIR Right 1950s  . LEFT HEART CATH AND CORONARY ANGIOGRAPHY N/A 12/21/2017   Procedure: LEFT HEART CATH AND CORONARY ANGIOGRAPHY possible PCI and stent;  Surgeon: Alwyn Peaallwood, Dwayne D, MD;  Location: [facility] INVASIVE CV LAB;  Service: Cardiovascular;  Laterality: N/A;  . TONSILLECTOMY     Family History  Problem Relation Age of Onset  . Breast cancer Mother 100   No Known Allergies     Assessment & Plan:  Patient  presents to review vascular studies.  The patient was last seen yesterday - January 09, 2018 for evaluation of a right groin hematoma. She was recently admitted to [facility] on December 20, 2017.  At that time, she presented to the emergency room with retrosternal chest pain, diaphoresis and shortness of breath. She subsequently was transferred to [facility] where she underwent PCI with a drug-eluting stent to the LAD x2 on December 22, 2017.  Postoperatively she was noted to have a hematoma.  The patient underwent a right lower extremity arterial ultrasound on 01/03/18 which was notable for  a hematoma measuring 7.5cm x 3.6cm x 9.6cm.  No evidence of pseudoaneurysm was mentioned.  The patient continued to experience right groin pain referred to our office.  The patient underwent a lower extremity venous DVT study which was notable for Right: Limited evaluation of the right lower extremity deep venous system.  Arterial flow is noted within the known hematoma of the right groin to just above possible pseudoaneurysm.  Left: No evidence of deep vein thrombosis in the lower extremity.  No indirect evidence of obstruction proximal to the inguinal ligament.  The patient denies any shortness of breath or chest pain.  The patient denies any fever, nausea or vomiting.  The patient has a three layer zinc oxide unna wrap to the right lower extremity on.   1. Hematoma of groin, subsequent encounter - Stable Arterial flow is noted within the known hematoma of the right groin to just above possible pseudoaneurysm. Recommend a right lower extremity angiogram with possible intervention to assess the patient's anatomy and if appropriate repair the pseudoaneurysm at that time. Procedure, risks and benefits explained to the patient and her family member who was present. All questions answered The patient was placed on Dr. Wyn Quaker schedule for tomorrow Once the pseudoaneurysm is repaired I will see the patient back in  the office to follow her hematoma / right lower extremity edema  2. Edema of right lower extremity - Stable Patient to continue undergoing three layer zinc oxide Unna wraps to the right lower extremity in an effort to gain control of her edema  Current Outpatient Medications on File Prior to Visit  Medication Sig Dispense Refill  . acetaminophen (TYLENOL) 500 MG tablet Take 1,000 mg by mouth 2 (two) times daily as needed (for pain/headache/fever).    Marland Kitchen aspirin EC 81 MG EC tablet 1 tab daily x 30 days only 30 tablet 0  . atorvastatin (LIPITOR) 80 MG tablet Take 1 tablet (80 mg total) by mouth daily at 6 PM. 30 tablet 0  . CALCIUM PO Take 1,200 mg by mouth daily.    . Cholecalciferol (VITAMIN D) 2000 units tablet Take 2,000 Units by mouth daily.    . clopidogrel (PLAVIX) 75 MG tablet Take 1 tablet (75 mg total) by mouth daily with breakfast. 30 tablet 3  . Cyanocobalamin 2500 MCG SUBL Place 2,500 mcg under the tongue daily.    Marland Kitchen loratadine (CLARITIN) 10 MG tablet Take 10 mg by mouth daily.    . metoprolol tartrate (LOPRESSOR) 12.5 mg TABS tablet Take 12.5 mg by mouth 2 (two) times daily.    . Multiple Vitamins-Minerals (PRESERVISION AREDS 2+MULTI VIT PO) Take 1 tablet by mouth 2 (two) times daily.    . Naphazoline-Pheniramine (OPCON-A OP) Place 1-2 drops into both eyes 3 (three) times daily as needed (for itchy/irritated eyes.).    Marland Kitchen nitroGLYCERIN (NITROSTAT) 0.4 MG SL tablet Place 1 tablet (0.4 mg total) under the tongue every 5 (five) minutes x 3 doses as needed for chest pain. 25 tablet 0  . pantoprazole (PROTONIX) 40 MG tablet Take 1 tablet (40 mg total) by mouth daily. 30 tablet 0  . sertraline (ZOLOFT) 100 MG tablet Take 100 mg by mouth daily.    . traZODone (DESYREL) 50 MG tablet Take 0.5 tablets (25 mg total) by mouth at bedtime as needed for sleep. 30 tablet 0  . vitamin C (ASCORBIC ACID) 500 MG tablet Take 500 mg by mouth daily.    Marland Kitchen warfarin (COUMADIN) 5 MG tablet Take 1 tablet (  5 mg  total) by mouth See admin instructions. Take one tab po qd 90 tablet 3   No current facility-administered medications on file prior to visit.    There are no Patient Instructions on file for this visit. No follow-ups on file.  Beatris Belen A Armaan Pond, PA-C

## 2018-01-11 ENCOUNTER — Encounter
Admission: RE | Disposition: A | Discharge status: Home / Self Care | Payer: Self-pay | Source: Ambulatory Visit | Attending: Vascular Surgery

## 2018-01-11 ENCOUNTER — Ambulatory Visit
Admission: RE | Admit: 2018-01-11 | Discharge: 2018-01-11 | Disposition: A | Discharge status: Home / Self Care | Payer: PPO | Source: Ambulatory Visit | Attending: Vascular Surgery | Admitting: Vascular Surgery

## 2018-01-11 DIAGNOSIS — Z7901 Long term (current) use of anticoagulants: Secondary | ICD-10-CM | POA: Insufficient documentation

## 2018-01-11 DIAGNOSIS — Z9889 Other specified postprocedural states: Secondary | ICD-10-CM | POA: Insufficient documentation

## 2018-01-11 DIAGNOSIS — I10 Essential (primary) hypertension: Secondary | ICD-10-CM | POA: Insufficient documentation

## 2018-01-11 DIAGNOSIS — Z9842 Cataract extraction status, left eye: Secondary | ICD-10-CM | POA: Insufficient documentation

## 2018-01-11 DIAGNOSIS — I214 Non-ST elevation (NSTEMI) myocardial infarction: Secondary | ICD-10-CM | POA: Diagnosis not present

## 2018-01-11 DIAGNOSIS — Z9841 Cataract extraction status, right eye: Secondary | ICD-10-CM | POA: Diagnosis not present

## 2018-01-11 DIAGNOSIS — Z955 Presence of coronary angioplasty implant and graft: Secondary | ICD-10-CM | POA: Insufficient documentation

## 2018-01-11 DIAGNOSIS — F329 Major depressive disorder, single episode, unspecified: Secondary | ICD-10-CM | POA: Diagnosis not present

## 2018-01-11 DIAGNOSIS — G4733 Obstructive sleep apnea (adult) (pediatric): Secondary | ICD-10-CM | POA: Diagnosis not present

## 2018-01-11 DIAGNOSIS — Z79899 Other long term (current) drug therapy: Secondary | ICD-10-CM | POA: Insufficient documentation

## 2018-01-11 DIAGNOSIS — Z7982 Long term (current) use of aspirin: Secondary | ICD-10-CM | POA: Insufficient documentation

## 2018-01-11 DIAGNOSIS — R600 Localized edema: Secondary | ICD-10-CM | POA: Insufficient documentation

## 2018-01-11 DIAGNOSIS — F419 Anxiety disorder, unspecified: Secondary | ICD-10-CM | POA: Insufficient documentation

## 2018-01-11 DIAGNOSIS — I724 Aneurysm of artery of lower extremity: Secondary | ICD-10-CM | POA: Diagnosis not present

## 2018-01-11 HISTORY — PX: LOWER EXTREMITY ANGIOGRAPHY: CATH118251

## 2018-01-11 LAB — PROTIME-INR
INR: 2.91
PROTHROMBIN TIME: 30.2 s — AB (ref 11.4–15.2)

## 2018-01-11 LAB — CREATININE, SERUM: CREATININE: 0.81 mg/dL (ref 0.44–1.00)

## 2018-01-11 LAB — BUN: BUN: 14 mg/dL (ref 6–20)

## 2018-01-11 SURGERY — LOWER EXTREMITY ANGIOGRAPHY
Anesthesia: Moderate Sedation | Laterality: Right

## 2018-01-11 MED ORDER — FAMOTIDINE 20 MG PO TABS: 40.0000 mg | ORAL_TABLET | ORAL | Status: DC | PRN

## 2018-01-11 MED ORDER — SODIUM CHLORIDE 0.9 % IV SOLN
INTRAVENOUS | Status: DC
  Administered 2018-01-11: 12:00:00 via INTRAVENOUS

## 2018-01-11 MED ORDER — IOPAMIDOL (ISOVUE-300) INJECTION 61%
INTRAVENOUS | Status: DC | PRN
  Administered 2018-01-11: 45 mL via INTRA_ARTERIAL

## 2018-01-11 MED ORDER — HYDROMORPHONE HCL 1 MG/ML IJ SOLN: 1.0000 mg | Freq: Once | INTRAMUSCULAR | Status: DC | PRN

## 2018-01-11 MED ORDER — SODIUM CHLORIDE 0.9% FLUSH: 3.0000 mL | Freq: Two times a day (BID) | INTRAVENOUS | Status: DC

## 2018-01-11 MED ORDER — ONDANSETRON HCL 4 MG/2ML IJ SOLN: 4.0000 mg | Freq: Four times a day (QID) | INTRAMUSCULAR | Status: DC | PRN

## 2018-01-11 MED ORDER — ACETAMINOPHEN 325 MG PO TABS: 650.0000 mg | ORAL_TABLET | ORAL | Status: DC | PRN

## 2018-01-11 MED ORDER — LIDOCAINE-EPINEPHRINE (PF) 1 %-1:200000 IJ SOLN
INTRAMUSCULAR | Status: AC
  Filled 2018-01-11: qty 30

## 2018-01-11 MED ORDER — CEFAZOLIN SODIUM-DEXTROSE 2-4 GM/100ML-% IV SOLN
INTRAVENOUS | Status: AC
  Administered 2018-01-11: 2 g via INTRAVENOUS
  Filled 2018-01-11: qty 100

## 2018-01-11 MED ORDER — SODIUM CHLORIDE 0.9% FLUSH: 3.0000 mL | INTRAVENOUS | Status: DC | PRN

## 2018-01-11 MED ORDER — HEPARIN SODIUM (PORCINE) 1000 UNIT/ML IJ SOLN
INTRAMUSCULAR | Status: DC | PRN
  Administered 2018-01-11: 3000 [IU] via INTRAVENOUS

## 2018-01-11 MED ORDER — SODIUM CHLORIDE 0.9 % IV SOLN: 250.0000 mL | INTRAVENOUS | Status: DC | PRN

## 2018-01-11 MED ORDER — HYDRALAZINE HCL 20 MG/ML IJ SOLN: 5.0000 mg | INTRAMUSCULAR | Status: DC | PRN

## 2018-01-11 MED ORDER — MIDAZOLAM HCL 5 MG/5ML IJ SOLN
INTRAMUSCULAR | Status: AC
  Filled 2018-01-11: qty 5

## 2018-01-11 MED ORDER — METHYLPREDNISOLONE SODIUM SUCC 125 MG IJ SOLR: 125.0000 mg | INTRAMUSCULAR | Status: DC | PRN

## 2018-01-11 MED ORDER — FENTANYL CITRATE (PF) 100 MCG/2ML IJ SOLN
INTRAMUSCULAR | Status: AC
  Filled 2018-01-11: qty 2

## 2018-01-11 MED ORDER — HEPARIN (PORCINE) IN NACL 1000-0.9 UT/500ML-% IV SOLN
INTRAVENOUS | Status: AC
  Filled 2018-01-11: qty 1000

## 2018-01-11 MED ORDER — MIDAZOLAM HCL 2 MG/2ML IJ SOLN
INTRAMUSCULAR | Status: DC | PRN
  Administered 2018-01-11: 2 mg via INTRAVENOUS
  Administered 2018-01-11: 1 mg via INTRAVENOUS

## 2018-01-11 MED ORDER — LABETALOL HCL 5 MG/ML IV SOLN: 10.0000 mg | INTRAVENOUS | Status: DC | PRN

## 2018-01-11 MED ORDER — FENTANYL CITRATE (PF) 100 MCG/2ML IJ SOLN
INTRAMUSCULAR | Status: DC | PRN
  Administered 2018-01-11: 50 ug via INTRAVENOUS
  Administered 2018-01-11: 25 ug via INTRAVENOUS

## 2018-01-11 MED ORDER — SODIUM CHLORIDE 0.9 % IV SOLN: INTRAVENOUS | Status: DC

## 2018-01-11 MED ORDER — HEPARIN SODIUM (PORCINE) 1000 UNIT/ML IJ SOLN
INTRAMUSCULAR | Status: AC
  Filled 2018-01-11: qty 1

## 2018-01-11 SURGICAL SUPPLY — 10 items
CATH BEACON 5 .035 65 KMP TIP (CATHETERS) ×3 IMPLANT
CATH PIG 70CM (CATHETERS) ×3 IMPLANT
DEVICE STARCLOSE SE CLOSURE (Vascular Products) ×3 IMPLANT
DEVICE TORQUE .025-.038 (MISCELLANEOUS) ×3 IMPLANT
GLIDEWIRE STIFF .35X180X3 HYDR (WIRE) ×3 IMPLANT
PACK ANGIOGRAPHY (CUSTOM PROCEDURE TRAY) ×3 IMPLANT
SHEATH BRITE TIP 5FRX11 (SHEATH) ×3 IMPLANT
SYR MEDRAD MARK V 150ML (SYRINGE) ×3 IMPLANT
TUBING CONTRAST HIGH PRESS 72 (TUBING) ×3 IMPLANT
WIRE J 3MM .035X145CM (WIRE) ×3 IMPLANT

## 2018-01-11 NOTE — Op Note (Signed)
[facility]  Percutaneous Study/Intervention Procedural Note   Date of Surgery: 01/11/2018  Surgeon(s):Tyger Oka    Assistants:none  Pre-operative Diagnosis: Pseudoaneurysm right groin after cardiac catheterization  Post-operative diagnosis:  Same, but without extravasation or arterial connection at this time appearing much more like a hematoma with the pseudoaneurysm having resolved  Procedure(s) Performed:             1.  Ultrasound guidance for vascular access left femoral artery             2.  Catheter placement into right medial epigastric artery, superficial femoral artery, and profunda femoris artery from left femoral approach             3.  Aortogram and selective right lower extremity angiogram             4.  StarClose closure device left femoral artery  EBL: 10 cc  Contrast: 45 cc  Fluoro Time: 6.6 minutes  Moderate Conscious Sedation Time: approximately 30 minutes using 3 mg of Versed and 75 Mcg of Fentanyl              Indications:  Patient is a 84 y.o.female with significant right groin bruise following a cardiac catheterization and a duplex earlier this week which suggested a pseudoaneurysm. The patient is brought in for angiography for further evaluation and potential treatment.  Risks and benefits are discussed and informed consent is obtained.   Procedure:  The patient was identified and appropriate procedural time out was performed.  The patient was then placed supine on the table and prepped and draped in the usual sterile fashion. Moderate conscious sedation was administered during a face to face encounter with the patient throughout the procedure with my supervision of the RN administering medicines and monitoring the patient's vital signs, pulse oximetry, telemetry and mental status throughout from the start of the procedure until the patient was taken to the recovery room. Ultrasound was used to evaluate the left common femoral artery.   It was patent .  A digital ultrasound image was acquired.  A Seldinger needle was used to access the left common femoral artery under direct ultrasound guidance and a permanent image was performed.  A 0.035 J wire was advanced without resistance and a 5Fr sheath was placed.  Pigtail catheter was placed into the aorta and an AP aortogram was performed. This demonstrated normal renal arteries and normal aorta and iliac segments without significant stenosis. I then crossed the aortic bifurcation and advanced to the right femoral head. Selective right lower extremity angiogram was then performed. This demonstrated Normal flow in the common femoral artery, profunda femoris artery, and superficial femoral artery with no evidence of extravasation or arterial connection to a pseudoaneurysm in any of the vessels including selective images of the medial epigastric artery, SFA, and profunda femoris artery.  We exchanged for a Kumpe catheter to get selective imaging of the medial epigastric artery SFA, and profunda femoris artery and multiple different obliquities and magnified images were performed with no pseudoaneurysm or extravasation from any of the arterial circuits identified today.  It was felt that her pseudoaneurysm had likely resolved leaving only a significant hematoma at this point.  I did not feel that any intervention would be of help at this point.  I elected to terminate the procedure. The sheath was removed and StarClose closure device was deployed in the left femoral artery with excellent hemostatic result. The patient was taken to the recovery room  in stable condition having tolerated the procedure well.  Findings:               Aortogram:  Catheter position was a little low, but the renal arteries appeared patent as did the aorta and iliac arteries without significant stenosis             Right lower Extremity:  Normal flow in the common femoral artery, profunda femoris artery, and superficial femoral  artery with no evidence of extravasation or arterial connection to a pseudoaneurysm in any of the vessels including selective images of the medial epigastric artery, SFA, and profunda femoris artery.   Disposition: Patient was taken to the recovery room in stable condition having tolerated the procedure well.  Complications: None  Festus BarrenJason Katrisha Segall 01/11/2018 1:48 PM   This note was created with Dragon Medical transcription system. Any errors in dictation are purely unintentional.

## 2018-01-11 NOTE — Progress Notes (Signed)
Dr. Wyn Quakerew at bedside speaking with pt. And daughter. Both verbalize understanding of conversation .

## 2018-01-11 NOTE — H&P (Signed)
[facility] History & Physical Update  The patient was interviewed and re-examined.  The patient's previous History and Physical has been reviewed and is unchanged.  There is no change in the plan of care. We plan to proceed with the scheduled procedure.  Festus BarrenJason Timmya Blazier, MD  01/11/2018, 12:17 PM

## 2018-01-12 ENCOUNTER — Encounter: Payer: Self-pay | Admitting: Vascular Surgery

## 2018-01-17 ENCOUNTER — Other Ambulatory Visit: Payer: Self-pay | Admitting: Nurse Practitioner

## 2018-01-17 ENCOUNTER — Other Ambulatory Visit: Payer: Self-pay | Admitting: Physician Assistant

## 2018-01-17 ENCOUNTER — Other Ambulatory Visit: Payer: Self-pay

## 2018-01-17 ENCOUNTER — Inpatient Hospital Stay
Admission: EM | Admit: 2018-01-17 | Discharge: 2018-01-23 | DRG: 920 | Disposition: A | Discharge status: Skilled Nursing Facility | Payer: PPO | Attending: Internal Medicine | Admitting: Internal Medicine

## 2018-01-17 ENCOUNTER — Telehealth (INDEPENDENT_AMBULATORY_CARE_PROVIDER_SITE_OTHER): Payer: Self-pay

## 2018-01-17 ENCOUNTER — Encounter: Payer: Self-pay | Admitting: Emergency Medicine

## 2018-01-17 DIAGNOSIS — R791 Abnormal coagulation profile: Secondary | ICD-10-CM | POA: Diagnosis not present

## 2018-01-17 DIAGNOSIS — I252 Old myocardial infarction: Secondary | ICD-10-CM

## 2018-01-17 DIAGNOSIS — Z9841 Cataract extraction status, right eye: Secondary | ICD-10-CM

## 2018-01-17 DIAGNOSIS — G4733 Obstructive sleep apnea (adult) (pediatric): Secondary | ICD-10-CM | POA: Diagnosis present

## 2018-01-17 DIAGNOSIS — I959 Hypotension, unspecified: Secondary | ICD-10-CM

## 2018-01-17 DIAGNOSIS — R2681 Unsteadiness on feet: Secondary | ICD-10-CM | POA: Diagnosis not present

## 2018-01-17 DIAGNOSIS — Z79899 Other long term (current) drug therapy: Secondary | ICD-10-CM | POA: Diagnosis not present

## 2018-01-17 DIAGNOSIS — Z7902 Long term (current) use of antithrombotics/antiplatelets: Secondary | ICD-10-CM

## 2018-01-17 DIAGNOSIS — I97638 Postprocedural hematoma of a circulatory system organ or structure following other circulatory system procedure: Principal | ICD-10-CM | POA: Diagnosis present

## 2018-01-17 DIAGNOSIS — Z86711 Personal history of pulmonary embolism: Secondary | ICD-10-CM

## 2018-01-17 DIAGNOSIS — Z955 Presence of coronary angioplasty implant and graft: Secondary | ICD-10-CM | POA: Diagnosis not present

## 2018-01-17 DIAGNOSIS — D649 Anemia, unspecified: Secondary | ICD-10-CM | POA: Diagnosis not present

## 2018-01-17 DIAGNOSIS — K862 Cyst of pancreas: Secondary | ICD-10-CM | POA: Diagnosis not present

## 2018-01-17 DIAGNOSIS — D689 Coagulation defect, unspecified: Secondary | ICD-10-CM | POA: Diagnosis not present

## 2018-01-17 DIAGNOSIS — D62 Acute posthemorrhagic anemia: Secondary | ICD-10-CM | POA: Diagnosis not present

## 2018-01-17 DIAGNOSIS — M6281 Muscle weakness (generalized): Secondary | ICD-10-CM | POA: Diagnosis not present

## 2018-01-17 DIAGNOSIS — I729 Aneurysm of unspecified site: Secondary | ICD-10-CM | POA: Diagnosis not present

## 2018-01-17 DIAGNOSIS — I10 Essential (primary) hypertension: Secondary | ICD-10-CM | POA: Diagnosis not present

## 2018-01-17 DIAGNOSIS — I724 Aneurysm of artery of lower extremity: Secondary | ICD-10-CM | POA: Diagnosis present

## 2018-01-17 DIAGNOSIS — D500 Iron deficiency anemia secondary to blood loss (chronic): Secondary | ICD-10-CM | POA: Diagnosis not present

## 2018-01-17 DIAGNOSIS — Z9842 Cataract extraction status, left eye: Secondary | ICD-10-CM | POA: Diagnosis not present

## 2018-01-17 DIAGNOSIS — I2510 Atherosclerotic heart disease of native coronary artery without angina pectoris: Secondary | ICD-10-CM | POA: Diagnosis present

## 2018-01-17 DIAGNOSIS — Z7401 Bed confinement status: Secondary | ICD-10-CM | POA: Diagnosis not present

## 2018-01-17 DIAGNOSIS — S7012XA Contusion of left thigh, initial encounter: Secondary | ICD-10-CM | POA: Diagnosis not present

## 2018-01-17 DIAGNOSIS — Z7982 Long term (current) use of aspirin: Secondary | ICD-10-CM

## 2018-01-17 DIAGNOSIS — Z741 Need for assistance with personal care: Secondary | ICD-10-CM | POA: Diagnosis not present

## 2018-01-17 DIAGNOSIS — M7981 Nontraumatic hematoma of soft tissue: Secondary | ICD-10-CM | POA: Diagnosis not present

## 2018-01-17 DIAGNOSIS — M79605 Pain in left leg: Secondary | ICD-10-CM | POA: Diagnosis not present

## 2018-01-17 DIAGNOSIS — R278 Other lack of coordination: Secondary | ICD-10-CM | POA: Diagnosis not present

## 2018-01-17 LAB — CBC
HEMATOCRIT: 19.2 % — AB (ref 35.0–47.0)
HEMOGLOBIN: 6.4 g/dL — AB (ref 12.0–16.0)
MCH: 27.4 pg (ref 26.0–34.0)
MCHC: 33.4 g/dL (ref 32.0–36.0)
MCV: 82 fL (ref 80.0–100.0)
Platelets: 211 10*3/uL (ref 150–440)
RBC: 2.34 MIL/uL — ABNORMAL LOW (ref 3.80–5.20)
RDW: 18 % — AB (ref 11.5–14.5)
WBC: 9.6 10*3/uL (ref 3.6–11.0)

## 2018-01-17 LAB — COMPREHENSIVE METABOLIC PANEL
ALK PHOS: 120 U/L (ref 38–126)
ALT: 22 U/L (ref 14–54)
ANION GAP: 9 (ref 5–15)
AST: 41 U/L (ref 15–41)
Albumin: 2.5 g/dL — ABNORMAL LOW (ref 3.5–5.0)
BILIRUBIN TOTAL: 0.7 mg/dL (ref 0.3–1.2)
BUN: 21 mg/dL — ABNORMAL HIGH (ref 6–20)
CALCIUM: 8.2 mg/dL — AB (ref 8.9–10.3)
CO2: 18 mmol/L — ABNORMAL LOW (ref 22–32)
Chloride: 112 mmol/L — ABNORMAL HIGH (ref 101–111)
Creatinine, Ser: 1.03 mg/dL — ABNORMAL HIGH (ref 0.44–1.00)
GFR calc non Af Amer: 46 mL/min — ABNORMAL LOW (ref >60)
GFR, EST AFRICAN AMERICAN: 54 mL/min — AB (ref >60)
Glucose, Bld: 250 mg/dL — ABNORMAL HIGH (ref 65–99)
Potassium: 4 mmol/L (ref 3.5–5.1)
Sodium: 139 mmol/L (ref 135–145)
TOTAL PROTEIN: 5.1 g/dL — AB (ref 6.5–8.1)

## 2018-01-17 LAB — TROPONIN I: Troponin I: <0.03 ng/mL (ref <0.03)

## 2018-01-17 LAB — PREPARE RBC (CROSSMATCH)

## 2018-01-17 LAB — PROTIME-INR
INR: 7.12 — AB
PROTHROMBIN TIME: 60.7 s — AB (ref 11.4–15.2)

## 2018-01-17 MED ORDER — SODIUM CHLORIDE 0.9 % IV BOLUS
1000.0000 mL | Freq: Once | INTRAVENOUS | Status: AC
  Administered 2018-01-17: 1000 mL via INTRAVENOUS

## 2018-01-17 MED ORDER — PHYTONADIONE 5 MG PO TABS
5.0000 mg | ORAL_TABLET | Freq: Once | ORAL | Status: AC
  Administered 2018-01-17: 5 mg via ORAL
  Filled 2018-01-17: qty 1

## 2018-01-17 MED ORDER — HYDROCODONE-ACETAMINOPHEN 5-325 MG PO TABS
1.0000 | ORAL_TABLET | ORAL | Status: DC | PRN
  Administered 2018-01-17 – 2018-01-18 (×2): 1 via ORAL
  Filled 2018-01-17 (×2): qty 1

## 2018-01-17 MED ORDER — ONDANSETRON HCL 4 MG/2ML IJ SOLN
4.0000 mg | Freq: Once | INTRAMUSCULAR | Status: AC
  Administered 2018-01-17: 4 mg via INTRAVENOUS
  Filled 2018-01-17: qty 2

## 2018-01-17 MED ORDER — VITAMIN K1 10 MG/ML IJ SOLN
5.0000 mg | Freq: Once | INTRAMUSCULAR | Status: AC
  Administered 2018-01-17: 5 mg via SUBCUTANEOUS
  Filled 2018-01-17: qty 1

## 2018-01-17 MED ORDER — SODIUM CHLORIDE 0.9 % IV SOLN
10.0000 mL/h | Freq: Once | INTRAVENOUS | Status: AC
  Administered 2018-01-18: 10 mL/h via INTRAVENOUS

## 2018-01-17 NOTE — ED Provider Notes (Signed)
[facility] Emergency Department Provider Note  Time seen: 8:09 PM  I have reviewed the triage vital signs and the nursing notes.   HISTORY  Chief Complaint Post-op Problem and Hypotension    HPI Alison Russo is a 84 y.o. female presents to the emergency department for nausea found to be hypotensive by EMS.  According to EMS they were called out for nausea and weakness found the patient be hypotensive 80/60.  Also saw significant ecchymosis of her left lower extremity, patient states she had a cardiac stent placed recently.  Patient states significant pain in this leg which has been an ongoing issue.  In reviewing the patient's records it appears that she had a stent placed 12/23/2017.  At that time they noted that the patient had a hematoma formed to her right groin when she was experiencing pain.  Due to the discomfort patient was expensing on the right groin patient had a catheterization of her left femoral artery on 01/11/2018 was found to have a hematoma without active extravasation, did not appear consistent with an active pseudoaneurysm.  In speaking to the patient and daughter the patient has had continued pain and bruising with swelling of the left thigh ever since this procedure.  Patient is currently taking Coumadin for a past PE.  Past Medical History:  Diagnosis Date  . Anginal pain (HCC)   . Anxiety   . Depression   . Dyspnea   . Heart murmur   . Hypertension   . NSTEMI (non-ST elevated myocardial infarction) (HCC) 12/22/2017  . OSA on CPAP   . PE (pulmonary embolism)     Patient Active Problem List   Diagnosis Date Noted  . Leg pain 01/09/2018  . Edema of right lower extremity 01/09/2018  . Atrial fibrillation (HCC) 12/28/2017  . Heart murmur 12/28/2017  . Tachycardia 12/28/2017  . CAD (coronary artery disease) 12/25/2017  . Essential hypertension 12/25/2017  . Hyperlipidemia 12/25/2017  . History of pulmonary embolism 12/25/2017  .  Hematoma of groin 12/25/2017  . Anemia 12/25/2017  . Chronic anticoagulation 12/22/2017  . NSTEMI (non-ST elevated myocardial infarction) (HCC) 12/21/2017  . Contusion of right knee 02/27/2016  . 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 BREAST CYST ASPIRATION Right    neg  . CARDIAC CATHETERIZATION    . CATARACT EXTRACTION, BILATERAL    . COLONOSCOPY    . CORONARY STENT INTERVENTION N/A 12/23/2017   Procedure: CORONARY STENT INTERVENTION;  Surgeon: Swaziland, Peter M, MD;  Location: [facility] INVASIVE CV LAB;  Service: Cardiovascular;  Laterality: N/A;  . EYE SURGERY Left    "blood vessel busted behind my eye"  . EYE SURGERY Right    ?vitrectomy  . HYSTEROSCOPY W/D&C N/A 10/25/2016   Procedure: DILATATION AND CURETTAGE /HYSTEROSCOPY, POLYPECTOMY;  Surgeon: Christeen Douglas, MD;  Location: [facility] ORS;  Service: Gynecology;  Laterality: N/A;  . INGUINAL HERNIA REPAIR Right 1950s  . LEFT HEART CATH AND CORONARY ANGIOGRAPHY N/A 12/21/2017   Procedure: LEFT HEART CATH AND CORONARY ANGIOGRAPHY possible PCI and stent;  Surgeon: Alwyn Pea, MD;  Location: [facility] INVASIVE CV LAB;  Service: Cardiovascular;  Laterality: N/A;  . LOWER EXTREMITY ANGIOGRAPHY Right 01/11/2018   Procedure: LOWER EXTREMITY ANGIOGRAPHY;  Surgeon: Annice Needy, MD;  Location: [facility] INVASIVE CV LAB;  Service: Cardiovascular;  Laterality: Right;  . TONSILLECTOMY      Prior to Admission medications   Medication Sig Start Date End Date  Taking? Authorizing Provider  acetaminophen (TYLENOL) 500 MG tablet Take 1,000 mg by mouth 2 (two) times daily as needed (for pain/headache/fever).    [provider]  aspirin EC 81 MG EC tablet 1 tab daily x 30 days only 12/25/17   Creig Hines, NP  atorvastatin (LIPITOR) 80 MG tablet Take 1 tablet (80 mg total) by mouth daily at 6 PM. 12/25/17   Creig Hines, NP  CALCIUM PO Take 1,200 mg by mouth daily.    [provider]  Cholecalciferol (VITAMIN D) 2000 units tablet Take 2,000 Units by mouth daily.    [provider]  clopidogrel (PLAVIX) 75 MG tablet Take 1 tablet (75 mg total) by mouth daily with breakfast. 12/26/17   Creig Hines, NP  Cyanocobalamin 2500 MCG SUBL Place 2,500 mcg under the tongue daily.    [provider]  loratadine (CLARITIN) 10 MG tablet Take 10 mg by mouth daily.    [provider]  metoprolol tartrate (LOPRESSOR) 12.5 mg TABS tablet Take 12.5 mg by mouth 2 (two) times daily.    [provider]  Multiple Vitamins-Minerals (PRESERVISION AREDS 2+MULTI VIT PO) Take 1 tablet by mouth 2 (two) times daily.    [provider]  Naphazoline-Pheniramine (OPCON-A OP) Place 1-2 drops into both eyes 3 (three) times daily as needed (for itchy/irritated eyes.).    [provider]  nitroGLYCERIN (NITROSTAT) 0.4 MG SL tablet Place 1 tablet (0.4 mg total) under the tongue every 5 (five) minutes x 3 doses as needed for chest pain. Patient not taking: Reported on 01/11/2018 12/25/17   Creig Hines, NP  pantoprazole (PROTONIX) 40 MG tablet Take 1 tablet (40 mg total) by mouth daily. 12/26/17 12/26/18  Manson Passey, PA  sertraline (ZOLOFT) 100 MG tablet Take 100 mg by mouth daily. 10/15/16   [provider]  traZODone (DESYREL) 50 MG tablet Take 0.5 tablets (25 mg total) by mouth at bedtime as needed for sleep. 12/22/17   Altamese Dilling, MD  vitamin C (ASCORBIC ACID) 500 MG tablet Take 500 mg by mouth daily.    [provider]  warfarin (COUMADIN) 5 MG tablet Take 1 tablet (5 mg total) by mouth See admin instructions. Take one tab po qd 12/28/17   Lyndon Code, MD    No Known Allergies  Family History  Problem Relation Age of Onset  . Breast cancer Mother 87    Social History Social History   Tobacco Use  . Smoking status: Never Smoker  . Smokeless tobacco: Never Used  Substance Use  Topics  . Alcohol use: Never    Frequency: Never  . Drug use: Never    Review of Systems Constitutional: Positive for weakness and lightheadedness. Eyes: Negative for visual complaints ENT: Negative for recent illness/congestion Cardiovascular: Negative for chest pain. Respiratory: Negative for shortness of breath. Gastrointestinal: Negative for abdominal pain.  Positive for nausea today. Genitourinary: Negative for urinary compaints Musculoskeletal: Left leg pain Skin: Bruising of the left thigh and groin Neurological: Negative for headache All other ROS negative  ____________________________________________   PHYSICAL EXAM:  Constitutional: Alert and oriented. Well appearing and in no distress. Eyes: Normal exam ENT   Head: Normocephalic and atraumatic.   Mouth/Throat: Mucous membranes are moist. Cardiovascular: Normal rate, regular rhythm. No murmur Respiratory: Normal respiratory effort without tachypnea nor retractions. Breath sounds are clear Gastrointestinal: Soft and nontender. No distention.  Musculoskeletal: Patient has fullness and induration of the left thigh with significant ecchymosis  in various stages of healing consistent with resolving but very large hematoma.  Very tender throughout this area including the left thigh and left groin. Neurologic:  Normal speech and language. No gross focal neurologic deficits  Skin:  Skin is warm, dry, but with significant ecchymosis as described above of the left groin and left thigh. Psychiatric: Mood and affect are normal.   ____________________________________________    EKG  EKG reviewed and interpreted by myself shows normal sinus rhythm at 91 bpm with a narrow QRS, normal axis, normal intervals nonspecific no concerning ST changes.  ____________________________________________   INITIAL IMPRESSION / ASSESSMENT AND PLAN / ED COURSE  Pertinent labs & imaging results that were available during my care of the  patient were reviewed by me and considered in my medical decision making (see chart for details).  Patient presents to the emergency department for left thigh pain nausea lightheadedness found to be hypotensive 80/60 by EMS currently 91/50 in the emergency department.  Patient was brought to the emergency department of the emergency traffic for her hypotension and concern of internal bleeding in the left thigh.  On examination the patient appears to have a very large left thigh hematoma, with various stages of healing ecchymosis.  Differential would include vasovagal event, hypotension due to dehydration/hypovolemia, hypotension due to anemia/bleed.  We will check labs and consider CT imaging to further evaluate him.  We will likely discuss with vascular surgery once results are known.  Patient and family agreeable.  Patient's H&H has resulted with a hemoglobin of 6.4.  We will consent for blood transfusion.  Patient's blood pressure is improving with IV fluids.  As long as the patient's chemistry will allow we will likely proceed with a CT scan with contrast of the left thigh to look for active extravasation.  Discussed patient with Dr. Gilda Crease of vascular surgery, he recommends holding off on CT imaging at this time given the patient's age and renal function as well as the elevated INR she would not be an operative candidate until the [facility] was reversed.  Recommends reversing INR prior to CT imaging.  I believe this is reasonable given the patient's likelihood of a very slow bleed over the past several days and significant supratherapeutic INR.  We will dose 5 mg oral vitamin K I have ordered a unit of red blood cells.  Patient will be admitted to the hospitalist service for further treatment.  Current blood pressure 104/50.   CRITICAL CARE Performed by: Minna Antis   Total critical care time: 30 minutes  Critical care time was exclusive of separately billable procedures and treating other  patients.  Critical care was necessary to treat or prevent imminent or life-threatening deterioration.  Critical care was time spent personally by me on the following activities: development of treatment plan with patient and/or surrogate as well as nursing, discussions with consultants, evaluation of patient's response to treatment, examination of patient, obtaining history from patient or surrogate, ordering and performing treatments and interventions, ordering and review of laboratory studies, ordering and review of radiographic studies, pulse oximetry and re-evaluation of patient's condition.  ____________________________________________   FINAL CLINICAL IMPRESSION(S) / ED DIAGNOSES  symptomatic anemia blood loss anemia post operative complication hypotension    Minna Antis, MD 01/17/18 2205

## 2018-01-17 NOTE — Telephone Encounter (Signed)
Patient called stating that she is due for an Radio broadcast assistant change tomorrow. She is concerned with a spot on her leg and just wants to make sure that Selena Batten will be here to take a look at it.  I called the patient back to let her know that Selena Batten will be here, but there was no voicemail setup so I couldn't leave a message.

## 2018-01-17 NOTE — H&P (Addendum)
[facility] -  at [facility]   PATIENT NAME: Alison Russo    MR#:  [id number]  DATE OF BIRTH:  [birth year]  DATE OF ADMISSION:  01/17/2018  PRIMARY CARE PHYSICIAN: Lyndon Code, MD   REQUESTING/REFERRING PHYSICIAN:   CHIEF COMPLAINT:   Chief Complaint  Patient presents with  . Post-op Problem  . Hypotension    HISTORY OF PRESENT ILLNESS: Alison Russo  is a 84 y.o. female with a known history of PE, on Coumadin, hypertension, coronary artery disease status post 2 stent placement approximately 3 weeks ago.  Patient was brought to emergency room for acute onset of nausea, diaphoresis and generalized weakness, going on for the past 24 hours.  Her blood pressure was low, 80/60, when paramedics arrived to her house.  She was noted with a large area of bruising and hematoma at left thigh and left groin area, status post left femoral artery catheterization from January 11, 2018. From reviewing the medical records, the initial cardiac cath from December 23, 2017 was done through the right groin.  Status post procedure, and developed a hematoma at the right groin.  The vascular surgeon attempted left femoral artery catheterization to reach to right groin area and evaluate for pseudoaneurysm.  No pseudoaneurysm was seen and no surgical intervention was done.  However, patient developed left thigh bruising and pain, after left femoral artery catheterization.  Left thigh swelling, bruising and tenderness have been progressively getting worse in the past 2 to 3 days. Blood test done emergency room revealed elevated INR at 7.12, low hemoglobin level at 6.4 and creatinine at 1.3.  Troponin level is negative.  No acute ischemic changes per EKG. Patient is admitted for further evaluation and treatment.  PAST MEDICAL HISTORY:   Past Medical History:  Diagnosis Date  . Anginal pain (HCC)   . Anxiety   . Depression   . Dyspnea   . Heart murmur   . Hypertension   . NSTEMI  (non-ST elevated myocardial infarction) (HCC) 12/22/2017  . OSA on CPAP   . PE (pulmonary embolism)     PAST SURGICAL HISTORY:  Past Surgical History:  Procedure Laterality Date  . BREAST CYST ASPIRATION Right    neg  . CARDIAC CATHETERIZATION    . CATARACT EXTRACTION, BILATERAL    . COLONOSCOPY    . CORONARY STENT INTERVENTION N/A 12/23/2017   Procedure: CORONARY STENT INTERVENTION;  Surgeon: Swaziland, Peter M, MD;  Location: [facility] INVASIVE CV LAB;  Service: Cardiovascular;  Laterality: N/A;  . EYE SURGERY Left    "blood vessel busted behind my eye"  . EYE SURGERY Right    ?vitrectomy  . HYSTEROSCOPY W/D&C N/A 10/25/2016   Procedure: DILATATION AND CURETTAGE /HYSTEROSCOPY, POLYPECTOMY;  Surgeon: Christeen Douglas, MD;  Location: [facility] ORS;  Service: Gynecology;  Laterality: N/A;  . INGUINAL HERNIA REPAIR Right 1950s  . LEFT HEART CATH AND CORONARY ANGIOGRAPHY N/A 12/21/2017   Procedure: LEFT HEART CATH AND CORONARY ANGIOGRAPHY possible PCI and stent;  Surgeon: Alwyn Pea, MD;  Location: [facility] INVASIVE CV LAB;  Service: Cardiovascular;  Laterality: N/A;  . LOWER EXTREMITY ANGIOGRAPHY Right 01/11/2018   Procedure: LOWER EXTREMITY ANGIOGRAPHY;  Surgeon: Annice Needy, MD;  Location: [facility] INVASIVE CV LAB;  Service: Cardiovascular;  Laterality: Right;  . TONSILLECTOMY      SOCIAL HISTORY:  Social History   Tobacco Use  . Smoking status: Never Smoker  . Smokeless tobacco: Never Used  Substance Use Topics  . Alcohol use:  Never    Frequency: Never    FAMILY HISTORY:  Family History  Problem Relation Age of Onset  . Breast cancer Mother 100    DRUG ALLERGIES: No Known Allergies  REVIEW OF SYSTEMS:   CONSTITUTIONAL: No fever, patient complains of severe fatigue and generalized weakness.  EYES: No blurred or double vision.  EARS, NOSE, AND THROAT: No tinnitus or ear pain.  RESPIRATORY: No cough, shortness of breath, wheezing or hemoptysis.  CARDIOVASCULAR: No chest pain, orthopnea,  edema.  GASTROINTESTINAL: No nausea, vomiting, diarrhea or abdominal pain.  GENITOURINARY: No dysuria, hematuria.  ENDOCRINE: No polyuria, nocturia,  HEMATOLOGY: Positive for large area of bruising at the left thigh, extending all the way to the left groin and suprapubic area. SKIN: No rash or lesion. MUSCULOSKELETAL: No joint pain or arthritis.   NEUROLOGIC: No focal weakness.  PSYCHIATRY: No anxiety or depression.   MEDICATIONS AT HOME:  Prior to Admission medications   Medication Sig Start Date End Date Taking? Authorizing Provider  acetaminophen (TYLENOL) 500 MG tablet Take 1,000 mg by mouth 2 (two) times daily as needed (for pain/headache/fever).   Yes [provider]  aspirin EC 81 MG EC tablet 1 tab daily x 30 days only 12/25/17  Yes Creig Hines, NP  atorvastatin (LIPITOR) 80 MG tablet Take 1 tablet (80 mg total) by mouth daily at 6 PM. 12/25/17  Yes Creig Hines, NP  CALCIUM PO Take 1,200 mg by mouth daily.   Yes [provider]  Cholecalciferol (VITAMIN D) 2000 units tablet Take 2,000 Units by mouth daily.   Yes [provider]  clopidogrel (PLAVIX) 75 MG tablet Take 1 tablet (75 mg total) by mouth daily with breakfast. 12/26/17  Yes Creig Hines, NP  Cyanocobalamin 2500 MCG SUBL Place 2,500 mcg under the tongue daily.   Yes [provider]  loratadine (CLARITIN) 10 MG tablet Take 10 mg by mouth daily.   Yes [provider]  metoprolol tartrate (LOPRESSOR) 12.5 mg TABS tablet Take 12.5 mg by mouth 2 (two) times daily.   Yes [provider]  Multiple Vitamins-Minerals (PRESERVISION AREDS 2+MULTI VIT PO) Take 1 tablet by mouth 2 (two) times daily.   Yes [provider]  Naphazoline-Pheniramine (OPCON-A OP) Place 1-2 drops into both eyes 3 (three) times daily as needed (for itchy/irritated eyes.).   Yes [provider]  pantoprazole (PROTONIX) 40 MG tablet Take 1 tablet (40 mg  total) by mouth daily. 12/26/17 12/26/18 Yes Bhagat, Bhavinkumar, PA  sertraline (ZOLOFT) 100 MG tablet Take 100 mg by mouth daily. 10/15/16  Yes [provider]  traZODone (DESYREL) 50 MG tablet Take 0.5 tablets (25 mg total) by mouth at bedtime as needed for sleep. 12/22/17  Yes Altamese Dilling, MD  vitamin C (ASCORBIC ACID) 500 MG tablet Take 500 mg by mouth daily.   Yes [provider]  warfarin (COUMADIN) 5 MG tablet Take 1 tablet (5 mg total) by mouth See admin instructions. Take one tab po qd 12/28/17  Yes Lyndon Code, MD  nitroGLYCERIN (NITROSTAT) 0.4 MG SL tablet Place 1 tablet (0.4 mg total) under the tongue every 5 (five) minutes x 3 doses as needed for chest pain. Patient not taking: Reported on 01/11/2018 12/25/17   Creig Hines, NP      PHYSICAL EXAMINATION:   VITAL SIGNS: Blood pressure (!) 112/52, pulse 78, temperature 97.6 F (36.4 C), resp. rate 17, height  (1.651 m), weight 92.5 kg (204 lb), SpO2  100 %.  GENERAL:  84 y.o.-year-old patient lying in the bed with moderate distress, due to left lower extremity pain.  EYES: Pupils equal, round, reactive to light and accommodation. No scleral icterus.  HEENT: Head atraumatic, normocephalic. Oropharynx and nasopharynx clear.  NECK:  Supple, no jugular venous distention. No thyroid enlargement, no tenderness.  LUNGS: Normal breath sounds bilaterally, no wheezing, rales,rhonchi or crepitation. No use of accessory muscles of respiration.  CARDIOVASCULAR: S1, S2 normal. No S3/S4.  ABDOMEN: Soft, nontender, nondistended. Bowel sounds present. No organomegaly or mass.  EXTREMITIES: There is a large area of ecchymosis, tenderness and induration covering the medial left thigh, extending to the left groin and suprapubic area.  NEUROLOGIC: No focal weakness.  Gait not checked, due to left lower extremity pain and swelling.  PSYCHIATRIC: The patient is alert and oriented x 3.  SKIN: Positive for left thigh  ecchymosis, as mentioned above.   LABORATORY PANEL:   CBC Recent Labs  Lab 01/17/18 2002  WBC 9.6  HGB 6.4*  HCT 19.2*  PLT 211  MCV 82.0  MCH 27.4  MCHC 33.4  RDW 18.0*   ------------------------------------------------------------------------------------------------------------------  Chemistries  Recent Labs  Lab 01/11/18 1138 01/17/18 2002  NA  --  139  K  --  4.0  CL  --  112*  CO2  --  18*  GLUCOSE  --  250*  BUN 14 21*  CREATININE 0.81 1.03*  CALCIUM  --  8.2*  AST  --  41  ALT  --  22  ALKPHOS  --  120  BILITOT  --  0.7   ------------------------------------------------------------------------------------------------------------------ estimated creatinine clearance is 40.8 mL/min (A) (by C-G formula based on SCr of 1.03 mg/dL (H)). ------------------------------------------------------------------------------------------------------------------ No results for input(s): TSH, T4TOTAL, T3FREE, THYROIDAB in the last 72 hours.  Invalid input(s): FREET3   Coagulation profile Recent Labs  Lab 01/11/18 1138 01/17/18 2002  INR 2.91 7.12*   ------------------------------------------------------------------------------------------------------------------- No results for input(s): DDIMER in the last 72 hours. -------------------------------------------------------------------------------------------------------------------  Cardiac Enzymes Recent Labs  Lab 01/17/18 2002  TROPONINI <0.03   ------------------------------------------------------------------------------------------------------------------ Invalid input(s): POCBNP  ---------------------------------------------------------------------------------------------------------------  Urinalysis    Component Value Date/Time   COLORURINE AMBER (A) 12/24/2017 1715   APPEARANCEUR CLEAR 12/24/2017 1715   LABSPEC 1.040 (H) 12/24/2017 1715   PHURINE 5.0 12/24/2017 1715   GLUCOSEU NEGATIVE 12/24/2017  1715   HGBUR NEGATIVE 12/24/2017 1715   BILIRUBINUR SMALL (A) 12/24/2017 1715   KETONESUR 5 (A) 12/24/2017 1715   PROTEINUR NEGATIVE 12/24/2017 1715   NITRITE NEGATIVE 12/24/2017 1715   LEUKOCYTESUR NEGATIVE 12/24/2017 1715     RADIOLOGY: No results found.  EKG: Orders placed or performed during the hospital encounter of 01/17/18  . EKG 12-Lead  . EKG 12-Lead  . ED EKG  . ED EKG    IMPRESSION AND PLAN:  1.  Left thigh hematoma, status post femoral artery catheterization.  Will reverse INR.  Patient was given 10 mg vitamin K.  Per discussion with vascular surgery, will check CAT scan at the area, when INR , WNL.  2.  Supratherapeutic INR.  Will discontinue Coumadin and give vitamin K to reverse INR. 3.  Anemia, secondary to acute blood loss.  Will transfuse 1 unit  PRBC now and continue to monitor H&H closely.  We will avoid any blood thinners and reverse INR. 4.  Hypotension, secondary to blood loss, responded to fluids.  Continue IVF and monitor blood pressure closely.  Hold BP meds. 5.  History of PE.  We will have to discontinue blood thinners for now, due to left thigh hematoma and severe anemia.  We will reevaluate the need for anticoagulant in the future.  Coumadin versus other anticoagulants were discussed with patient and her family.  6.  Coronary artery disease, this post recent stent placement.  Stable, continue medical management.   All the records are reviewed and case discussed with ED provider. Management plans discussed with the patient, family and they are in agreement.  CODE STATUS: Code Status History    Date Active Date Inactive Code Status Order ID Comments User Context   01/11/2018 1358 01/11/2018 1911 Full Code [id number]  Annice Needy, MD Inpatient   12/22/2017 1940 12/26/2017 1525 Full Code [id number]  Allayne Butcher, PA-C Inpatient   12/21/2017 0207 12/22/2017 1839 Full Code [id number]  Cammy Copa, MD ED       TOTAL TIME TAKING CARE OF THIS PATIENT: 45  minutes.    Cammy Copa M.D on 01/17/2018 at 11:24 PM  Between 7am to 6pm - Pager - [phone]  After 6pm go to [email or web address] - password [email or web address] [facility]  [facility]  Office  [phone]  CC: Primary care physician; Lyndon Code, MD

## 2018-01-17 NOTE — ED Triage Notes (Signed)
Patient from home via [facility]. Reports noticing increased bruising and pain to left thigh where she had cardiac cath done 2 weeks ago. Patient denies fever or SOB or chest pain. Patient unsure of when bruising worsened but states pain increased with weigh bearing. Patient denies having follow-up visit with Dr. Juliann Pares yet. Per EMS, patient was hypotensive of 80/40 on scene. MD at bedside upon arrival. Pedal pulse noted.

## 2018-01-18 ENCOUNTER — Encounter (INDEPENDENT_AMBULATORY_CARE_PROVIDER_SITE_OTHER): Payer: PPO

## 2018-01-18 ENCOUNTER — Other Ambulatory Visit: Payer: Self-pay | Admitting: Nurse Practitioner

## 2018-01-18 DIAGNOSIS — I2510 Atherosclerotic heart disease of native coronary artery without angina pectoris: Secondary | ICD-10-CM

## 2018-01-18 DIAGNOSIS — M79605 Pain in left leg: Secondary | ICD-10-CM

## 2018-01-18 DIAGNOSIS — M7981 Nontraumatic hematoma of soft tissue: Secondary | ICD-10-CM

## 2018-01-18 LAB — BASIC METABOLIC PANEL
Anion gap: 4 — ABNORMAL LOW (ref 5–15)
BUN: 22 mg/dL — AB (ref 6–20)
CHLORIDE: 111 mmol/L (ref 101–111)
CO2: 24 mmol/L (ref 22–32)
CREATININE: 0.83 mg/dL (ref 0.44–1.00)
Calcium: 8.1 mg/dL — ABNORMAL LOW (ref 8.9–10.3)
GFR calc Af Amer: >60 mL/min (ref >60)
GFR calc non Af Amer: >60 mL/min (ref >60)
GLUCOSE: 135 mg/dL — AB (ref 65–99)
Potassium: 4.4 mmol/L (ref 3.5–5.1)
Sodium: 139 mmol/L (ref 135–145)

## 2018-01-18 LAB — PROTIME-INR
INR: 5.2
Prothrombin Time: 47.5 seconds — ABNORMAL HIGH (ref 11.4–15.2)

## 2018-01-18 LAB — CBC
HEMATOCRIT: 22.5 % — AB (ref 35.0–47.0)
Hemoglobin: 7.5 g/dL — ABNORMAL LOW (ref 12.0–16.0)
MCH: 27.5 pg (ref 26.0–34.0)
MCHC: 33.1 g/dL (ref 32.0–36.0)
MCV: 83.1 fL (ref 80.0–100.0)
PLATELETS: 183 10*3/uL (ref 150–440)
RBC: 2.71 MIL/uL — ABNORMAL LOW (ref 3.80–5.20)
RDW: 17.3 % — AB (ref 11.5–14.5)
WBC: 9.8 10*3/uL (ref 3.6–11.0)

## 2018-01-18 LAB — GLUCOSE, CAPILLARY: Glucose-Capillary: 94 mg/dL (ref 65–99)

## 2018-01-18 MED ORDER — PANTOPRAZOLE SODIUM 40 MG PO TBEC
40.0000 mg | DELAYED_RELEASE_TABLET | Freq: Every day | ORAL | Status: DC
  Administered 2018-01-18 – 2018-01-23 (×5): 40 mg via ORAL
  Filled 2018-01-18 (×6): qty 1

## 2018-01-18 MED ORDER — ACETAMINOPHEN 325 MG PO TABS: 650.0000 mg | ORAL_TABLET | Freq: Four times a day (QID) | ORAL | Status: DC | PRN

## 2018-01-18 MED ORDER — TETRAHYDROZOLINE HCL 0.05 % OP SOLN
1.0000 [drp] | Freq: Three times a day (TID) | OPHTHALMIC | Status: DC | PRN
Start: 2018-01-18 — End: 2018-01-23
  Filled 2018-01-18: qty 15

## 2018-01-18 MED ORDER — BISACODYL 5 MG PO TBEC
5.0000 mg | DELAYED_RELEASE_TABLET | Freq: Every day | ORAL | Status: DC | PRN
  Administered 2018-01-22: 5 mg via ORAL
  Filled 2018-01-18: qty 1

## 2018-01-18 MED ORDER — LORATADINE 10 MG PO TABS
10.0000 mg | ORAL_TABLET | Freq: Every day | ORAL | Status: DC
  Administered 2018-01-18 – 2018-01-22 (×4): 10 mg via ORAL
  Filled 2018-01-18 (×6): qty 1

## 2018-01-18 MED ORDER — ONDANSETRON HCL 4 MG PO TABS
4.0000 mg | ORAL_TABLET | Freq: Four times a day (QID) | ORAL | Status: DC | PRN
  Administered 2018-01-22: 4 mg via ORAL
  Filled 2018-01-18: qty 1

## 2018-01-18 MED ORDER — DOCUSATE SODIUM 100 MG PO CAPS
100.0000 mg | ORAL_CAPSULE | Freq: Two times a day (BID) | ORAL | Status: DC
  Administered 2018-01-18 – 2018-01-23 (×9): 100 mg via ORAL
  Filled 2018-01-18 (×11): qty 1

## 2018-01-18 MED ORDER — VITAMIN C 500 MG PO TABS
500.0000 mg | ORAL_TABLET | Freq: Every day | ORAL | Status: DC
  Administered 2018-01-18 – 2018-01-21 (×3): 500 mg via ORAL
  Filled 2018-01-18 (×6): qty 1

## 2018-01-18 MED ORDER — TRAZODONE HCL 50 MG PO TABS: 25.0000 mg | ORAL_TABLET | Freq: Every evening | ORAL | Status: DC | PRN

## 2018-01-18 MED ORDER — SODIUM CHLORIDE 0.9 % IV SOLN
INTRAVENOUS | Status: DC
  Administered 2018-01-18: 75 mL/h via INTRAVENOUS
  Administered 2018-01-18 – 2018-01-19 (×2): via INTRAVENOUS

## 2018-01-18 MED ORDER — OCUVITE-LUTEIN PO CAPS
ORAL_CAPSULE | Freq: Two times a day (BID) | ORAL | Status: DC
  Administered 2018-01-18 – 2018-01-22 (×7): 1 via ORAL
  Filled 2018-01-18 (×13): qty 1

## 2018-01-18 MED ORDER — METOPROLOL TARTRATE 25 MG PO TABS
12.5000 mg | ORAL_TABLET | Freq: Two times a day (BID) | ORAL | Status: DC
  Administered 2018-01-18 – 2018-01-23 (×9): 12.5 mg via ORAL
  Filled 2018-01-18 (×11): qty 1

## 2018-01-18 MED ORDER — CYANOCOBALAMIN 2500 MCG SL SUBL: 2500.0000 ug | SUBLINGUAL_TABLET | Freq: Every day | SUBLINGUAL | Status: DC

## 2018-01-18 MED ORDER — VITAMIN D 1000 UNITS PO TABS
2000.0000 [IU] | ORAL_TABLET | Freq: Every day | ORAL | Status: DC
  Administered 2018-01-18 – 2018-01-21 (×3): 2000 [IU] via ORAL
  Filled 2018-01-18 (×6): qty 2

## 2018-01-18 MED ORDER — ATORVASTATIN CALCIUM 20 MG PO TABS
80.0000 mg | ORAL_TABLET | Freq: Every day | ORAL | Status: DC
  Administered 2018-01-18 – 2018-01-22 (×5): 80 mg via ORAL
  Filled 2018-01-18 (×5): qty 4

## 2018-01-18 MED ORDER — SERTRALINE HCL 50 MG PO TABS
100.0000 mg | ORAL_TABLET | Freq: Every day | ORAL | Status: DC
  Administered 2018-01-18 – 2018-01-23 (×5): 100 mg via ORAL
  Filled 2018-01-18 (×6): qty 2

## 2018-01-18 MED ORDER — ACETAMINOPHEN 650 MG RE SUPP: 650.0000 mg | Freq: Four times a day (QID) | RECTAL | Status: DC | PRN

## 2018-01-18 MED ORDER — VITAMIN B-12 1000 MCG PO TABS
2500.0000 ug | ORAL_TABLET | Freq: Every day | ORAL | Status: DC
  Administered 2018-01-18 – 2018-01-21 (×3): 2500 ug via ORAL
  Filled 2018-01-18 (×6): qty 3

## 2018-01-18 MED ORDER — NAPHAZOLINE-PHENIRAMINE 0.027-0.315 % OP SOLN: Freq: Three times a day (TID) | OPHTHALMIC | Status: DC | PRN

## 2018-01-18 MED ORDER — TRAZODONE HCL 50 MG PO TABS
25.0000 mg | ORAL_TABLET | Freq: Every evening | ORAL | Status: DC | PRN
  Administered 2018-01-19: 25 mg via ORAL
  Filled 2018-01-18: qty 1

## 2018-01-18 MED ORDER — ONDANSETRON HCL 4 MG/2ML IJ SOLN
4.0000 mg | Freq: Four times a day (QID) | INTRAMUSCULAR | Status: DC | PRN
  Administered 2018-01-21 – 2018-01-22 (×2): 4 mg via INTRAVENOUS
  Filled 2018-01-18 (×2): qty 2

## 2018-01-18 MED ORDER — NITROGLYCERIN 0.4 MG SL SUBL
0.4000 mg | SUBLINGUAL_TABLET | SUBLINGUAL | Status: DC | PRN
Start: 2018-01-18 — End: 2018-01-23

## 2018-01-18 MED ORDER — HEPARIN SODIUM (PORCINE) 5000 UNIT/ML IJ SOLN: 5000.0000 [IU] | Freq: Three times a day (TID) | INTRAMUSCULAR | Status: DC

## 2018-01-18 NOTE — Progress Notes (Signed)
Report given by ED RN Vernona Rieger to 1A charge RN Gavin Pound. Pt meets criteria to be on a low bed. Low bed requested. Awaiting for it this time.

## 2018-01-18 NOTE — Progress Notes (Signed)
Pt admitted to room 159 on a low bed, alert and oriented X4, not in any respiratory distress, with purple discoloration on the left groin and large ecchymosis on the left thigh. Distal pulses palpable, right lower leg wrapped in gauze and coban dressing. Per pt "they put that medicated gauze after I had my stents". Pt denies having any wound on the right leg. Pt for blood transfusion with 1 unit PRBC, hemoglobin=6.4. Bed alarm on for safety, call [place] within reach, will continue to monitor and attend pt's needs.

## 2018-01-18 NOTE — Consult Note (Signed)
[facility] Vascular Consult Note  MRN : [id number]  Alison Russo is a 84 y.o. (12/27/1925) female who presents with chief complaint of  Chief Complaint  Patient presents with  . Post-op Problem  . Hypotension  .  History of Present Illness:   I am asked to evaluate the patient by Dr. Caryn Bee for left thigh hematoma.  The patient sustained a myocardial infarction December 20, 2017 preliminary catheterization demonstrated a situation that warranted transfer to [facility].  At [facility] main hospital she underwent successful coronary intervention.  Postoperatively she developed a significant right groin hematoma.  Initially she was scanned with ultrasound which was negative for a pseudoaneurysm.  She was asked to follow-up in my office where a second ultrasound suggested there was a pseudoaneurysm.  Given this finding and the profound leg swelling and ecchymosis noted in the right groin she subsequently underwent a angiogram with a left femoral approach.  This angiogram was negative for pseudoaneurysm.  She did well postoperatively with no immediate complications.  She presented to the emergency room yesterday evening with increasing pain and ecchymosis of the left groin as well as the midline level of her lower abdomen.  By report she was initially mildly hypotensive.  Initial lab values returned a hemoglobin of 6 and an INR greater than 7.  She was subsequently admitted with the intention for correction of her profound coagulopathy as well as transfusion to correct her anemia given her coronary disease and her age.  This morning she is noted to have a hemoglobin of 7 with an INR greater than 5.  At the time of my interview she is continuing to complain of severe pain in her left groin with significant swelling of the thigh groin area.  Current Facility-Administered Medications  Medication Dose Route Frequency Provider Last Rate Last Dose  . 0.9 %  sodium chloride  infusion   Intravenous Continuous Cammy Copa, MD 75 mL/hr at 01/18/18 1740    . acetaminophen (TYLENOL) tablet 650 mg  650 mg Oral Q6H PRN Cammy Copa, MD       Or  . acetaminophen (TYLENOL) suppository 650 mg  650 mg Rectal Q6H PRN Cammy Copa, MD      . atorvastatin (LIPITOR) tablet 80 mg  80 mg Oral q1800 Cammy Copa, MD   80 mg at 01/18/18 1741  . bisacodyl (DULCOLAX) EC tablet 5 mg  5 mg Oral Daily PRN Cammy Copa, MD      . cholecalciferol (VITAMIN D) tablet 2,000 Units  2,000 Units Oral Daily Cammy Copa, MD   2,000 Units at 01/18/18 1109  . docusate sodium (COLACE) capsule 100 mg  100 mg Oral BID Cammy Copa, MD      . HYDROcodone-acetaminophen (NORCO/VICODIN) 5-325 MG per tablet 1-2 tablet  1-2 tablet Oral Q4H PRN Cammy Copa, MD   1 tablet at 01/18/18 1740  . loratadine (CLARITIN) tablet 10 mg  10 mg Oral Daily Cammy Copa, MD   10 mg at 01/18/18 1109  . metoprolol tartrate (LOPRESSOR) tablet 12.5 mg  12.5 mg Oral BID Cammy Copa, MD      . multivitamin-lutein (OCUVITE-LUTEIN) capsule   Oral BID Cammy Copa, MD      . nitroGLYCERIN (NITROSTAT) SL tablet 0.4 mg  0.4 mg Sublingual Q5 Min x 3 PRN Cammy Copa, MD      . ondansetron [facility]) tablet 4 mg  4 mg Oral Q6H PRN Cammy Copa, MD  Or  . ondansetron (ZOFRAN) injection 4 mg  4 mg Intravenous Q6H PRN Cammy Copa, MD      . pantoprazole (PROTONIX) EC tablet 40 mg  40 mg Oral Daily Cammy Copa, MD   40 mg at 01/18/18 1108  . sertraline (ZOLOFT) tablet 100 mg  100 mg Oral Daily Cammy Copa, MD   100 mg at 01/18/18 1108  . tetrahydrozoline 0.05 % ophthalmic solution 1 drop  1 drop Both Eyes TID PRN Cammy Copa, MD      . traZODone (DESYREL) tablet 25 mg  25 mg Oral QHS PRN Cammy Copa, MD      . vitamin B-12 (CYANOCOBALAMIN) tablet 2,500 mcg  2,500 mcg Oral Daily Cammy Copa, MD   2,500 mcg at 01/18/18 1109  . vitamin C (ASCORBIC ACID) tablet 500 mg  500 mg Oral Daily Cammy Copa, MD   500 mg  at 01/18/18 1233    Past Medical History:  Diagnosis Date  . Anginal pain (HCC)   . Anxiety   . Depression   . Dyspnea   . Heart murmur   . Hypertension   . NSTEMI (non-ST elevated myocardial infarction) (HCC) 12/22/2017  . OSA on CPAP   . PE (pulmonary embolism)     Past Surgical History:  Procedure Laterality Date  . BREAST CYST ASPIRATION Right    neg  . CARDIAC CATHETERIZATION    . CATARACT EXTRACTION, BILATERAL    . COLONOSCOPY    . CORONARY STENT INTERVENTION N/A 12/23/2017   Procedure: CORONARY STENT INTERVENTION;  Surgeon: [place], Peter M, MD;  Location: [facility] INVASIVE CV LAB;  Service: Cardiovascular;  Laterality: N/A;  . EYE SURGERY Left    "blood vessel busted behind my eye"  . EYE SURGERY Right    ?vitrectomy  . HYSTEROSCOPY W/D&C N/A 10/25/2016   Procedure: DILATATION AND CURETTAGE /HYSTEROSCOPY, POLYPECTOMY;  Surgeon: Christeen Douglas, MD;  Location: [facility] ORS;  Service: Gynecology;  Laterality: N/A;  . INGUINAL HERNIA REPAIR Right 1950s  . LEFT HEART CATH AND CORONARY ANGIOGRAPHY N/A 12/21/2017   Procedure: LEFT HEART CATH AND CORONARY ANGIOGRAPHY possible PCI and stent;  Surgeon: Alwyn Pea, MD;  Location: [facility] INVASIVE CV LAB;  Service: Cardiovascular;  Laterality: N/A;  . LOWER EXTREMITY ANGIOGRAPHY Right 01/11/2018   Procedure: LOWER EXTREMITY ANGIOGRAPHY;  Surgeon: Annice Needy, MD;  Location: [facility] INVASIVE CV LAB;  Service: Cardiovascular;  Laterality: Right;  . TONSILLECTOMY      Social History Social History   Tobacco Use  . Smoking status: Never Smoker  . Smokeless tobacco: Never Used  Substance Use Topics  . Alcohol use: Never    Frequency: Never  . Drug use: Never    Family History Family History  Problem Relation Age of Onset  . Breast cancer Mother 100  No family history of bleeding/clotting disorders, porphyria or autoimmune disease   No Known Allergies   REVIEW OF SYSTEMS (Negative unless checked)  Constitutional: Weight loss   Fever  Chills Cardiac: Chest pain   Chest pressure   Palpitations   Shortness of breath when laying flat   Shortness of breath at rest   Shortness of breath with exertion. Vascular:  Pain in legs with walking   Pain in legs at rest   Pain in legs when laying flat   Claudication   Pain in feet when walking  Pain in feet at rest  Pain in feet when laying flat   History of DVT   Phlebitis   [  x]Swelling in legs   Varicose veins   Non-healing ulcers Pulmonary:   Uses home oxygen   Productive cough   Hemoptysis   Wheeze  COPD   Asthma Neurologic:  Dizziness  Blackouts   Seizures   History of stroke   History of TIA  Aphasia   Temporary blindness   Dysphagia   Weakness or numbness in arms   Weakness or numbness in legs Musculoskeletal:  Arthritis   Joint swelling   Joint pain   Low back pain Hematologic:  Easy bruising  Easy bleeding   Hypercoagulable state   Anemic  Hepatitis Gastrointestinal:  Blood in stool   Vomiting blood  Gastroesophageal reflux/heartburn   Difficulty swallowing. Genitourinary:  Chronic kidney disease   Difficult urination  Frequent urination  Burning with urination   Blood in urine Skin:  Rashes   Ulcers   Wounds Psychological:  History of anxiety    History of major depression.  Physical Examination  Vitals:   01/18/18 0442 01/18/18 0500 01/18/18 0936 01/18/18 1644  BP: 108/79  118/65 132/69  Pulse: 73  87 86  Resp: Temp: 97.6 F (36.4 C)  97.6 F (36.4 C) 98.2 F (36.8 C)  TempSrc: Oral  Oral Oral  SpO2: 99%  95% 96%  Weight:  204 lb 8 oz (92.8 kg)    Height:       Body mass index is 34.03 kg/m. Gen:  WD/WN, NAD Head: [place]/AT, No temporalis wasting. Prominent temp pulse not noted. Ear/Nose/Throat: Hearing grossly intact, nares w/o erythema or drainage, oropharynx w/o Erythema/Exudate Eyes: Sclera non-icteric, conjunctiva clear Neck:  Trachea midline.  No JVD.  Pulmonary:  Good air movement, respirations not labored, equal bilaterally.  Cardiac: RRR, normal S1, S2. Vascular: Severe swelling and ecchymoses of the left groin and thigh.  Right lower extremity edema is very well controlled with the owner wrap Vessel Right Left  Radial Palpable Palpable  PT Palpable Palpable  DP Palpable Palpable  Gastrointestinal: soft, non-tender/non-distended. No guarding/reflex.  Musculoskeletal: M/S 5/5 throughout.  Extremities without ischemic changes.  No deformity or atrophy. No edema. Neurologic: Sensation grossly intact in extremities.  Symmetrical.  Speech is fluent. Motor exam as listed above. Psychiatric: Judgment intact, Mood & affect appropriate for pt's clinical situation. Dermatologic: No rashes or ulcers noted.  No cellulitis or open wounds. Lymph : No Cervical, Axillary, or Inguinal lymphadenopathy.      CBC Lab Results  Component Value Date   WBC 9.8 01/18/2018   HGB 7.5 (L) 01/18/2018   HCT 22.5 (L) 01/18/2018   MCV 83.1 01/18/2018   PLT 183 01/18/2018    BMET    Component Value Date/Time   NA 139 01/18/2018 0519   NA 143 09/28/2017 1259   K 4.4 01/18/2018 0519   CL 111 01/18/2018 0519   CO2 24 01/18/2018 0519   GLUCOSE 135 (H) 01/18/2018 0519   BUN 22 (H) 01/18/2018 0519   BUN 16 09/28/2017 1259   CREATININE 0.83 01/18/2018 0519   CALCIUM 8.1 (L) 01/18/2018 0519   GFRNONAA >60 01/18/2018 0519   GFRAA >60 01/18/2018 0519   Estimated Creatinine Clearance: 50.7 mL/min (by C-G formula based on SCr of 0.83 mg/dL).  COAG Lab Results  Component Value Date   INR 5.20 (HH) 01/18/2018   INR 7.12 (HH) 01/17/2018   INR 2.91 01/11/2018    Radiology Ct Abdomen Pelvis Wo Contrast  Result Date: 12/24/2017 CLINICAL DATA:  Status post cardiac catheterization on 12/21/2017  and PCI on 12/23/2017 via right femoral artery approach. Right groin pain and hematoma present. EXAM: CT ABDOMEN AND PELVIS WITHOUT  CONTRAST TECHNIQUE: Multidetector CT imaging of the abdomen and pelvis was performed following the standard protocol without IV contrast. COMPARISON:  Prior CT of the abdomen and pelvis with contrast on 05/25/2007 FINDINGS: Lower chest: Mild bibasilar atelectasis. Hepatobiliary: Unenhanced appearance of the liver is unremarkable. There is some vicarious excretion of contrast into the gallbladder lumen. There is no evidence of biliary ductal dilatation. Pancreas: Unenhanced appearance of the pancreas is unremarkable. Spleen: Unenhanced appearance of the spleen is unremarkable. Adrenals/Urinary Tract: Adrenal glands are unremarkable. There likely is a tiny amount of residual excreted contrast material in both renal collecting systems without hydronephrosis. Some contrast is also present in a decompressed bladder which demonstrates some mild wall thickening and trabeculation with probable small anterior and left lateral diverticula. Stomach/Bowel: Bowel shows no evidence of obstruction, ileus or obvious lesion. No free air identified. Vascular/Lymphatic: Hemorrhage is seen primarily centered around the level of the right common femoral artery and extending into the anterior proximal right thigh in the extra muscular soft tissues and also superiorly in the right lateral retroperitoneal space around the distal iliac vessels of the pelvis. Hemorrhage does not extend into the proper abdomen. The abdominal aorta and iliac arteries are heavily calcified. No enlarged lymph nodes identified. Reproductive: Densely calcified degenerated fibroid is noted of the uterus. No adnexal masses identified. Other: No free fluid or hernias. Musculoskeletal: Bony structures are unremarkable. IMPRESSION: Hemorrhage primarily centered at the level of right common femoral arterial access and tracking inferiorly in the anterior right thigh and superiorly into the extraperitoneal space of the right lateral pelvis. Degree of hemorrhage is  moderate and not large. Retroperitoneal extension does not extend into the true abdomen. These results will be called to the ordering clinician or representative by the Radiologist Assistant, and communication documented in the PACS or zVision Dashboard. Electronically Signed   By: Irish Lack M.D.   On: 12/24/2017 10:08   Dg Chest 1 View  Result Date: 12/20/2017 CLINICAL DATA:  Chest pain EXAM: CHEST  1 VIEW COMPARISON:  05/23/2007 FINDINGS: Cardiac shadow is mildly enlarged. Aortic calcifications are again seen. Some increased density is noted in the medial aspect of the right apex increased from the prior exam. Previously these changes were related to prominent mediastinal soft tissues it would be difficult to exclude an underlying infiltrate. Mild interstitial changes of a chronic nature are seen. No focal infiltrate or sizable effusion is noted. IMPRESSION: Increased density in the right apex which may represent an underlying infiltrate. Electronically Signed   By: Alcide Clever M.D.   On: 12/20/2017 21:56   [place] Lower Ext Art Right Ltd  Result Date: 01/03/2018 CLINICAL DATA:  Recent heart catheterization with right groin pain. EXAM: RIGHT LOWER EXTREMITY ARTERIAL DUPLEX SCAN - LIMITED TECHNIQUE: Gray-scale sonography as well as color Doppler and duplex ultrasound was performed to evaluate the lower extremity at the area of concern COMPARISON:  None. FINDINGS: Right common femoral artery is patent. Right common femoral vein is patent. Complex heterogeneous collection anterior to the right femoral vessels. This structure measures 7.5 x 3.6 x 9.6 cm. The findings are most compatible with a hematoma. No evidence for a pseudoaneurysm. IMPRESSION: Hematoma in the right groin.  No evidence for a pseudoaneurysm. Right common femoral artery is patent. Electronically Signed   By: Richarda Overlie M.D.   On: 01/03/2018 13:43  Assessment/Plan 1.  Left thigh hematoma: This is most likely secondary to a  supratherapeutic INR and quite probably venous bleeding.  After angiography Star close device was utilized.  An immediate failure would have been noted and quite obvious.  Failures several days later of closure devices or for that matter traditional holding pressure are extremely rare.  At this point correction of her supratherapeutic INR is paramount.  Once this has been normalized I would still plan to do a CT scan.  Until the coagulopathy has been corrected surgery to correct any problem would not be reasonable.  Therefore I would move forward with aggressive correction of the supratherapeutic INR. 2.  Coronary artery disease: Patient is status post recent intervention.  Correction of her anemia would be warranted. 3.  History of PE: Given her significant hematoma as well as her anemia cessation of anticoagulation temporarily is warranted.   Levora Dredge, MD  01/18/2018 8:50 PM    This note was created with Dragon medical transcription system.  Any error is purely unintentional

## 2018-01-18 NOTE — Progress Notes (Signed)
CRITICAL VALUE ALERT  Critical value received:  INR - 5.20  Date of notification:  01/18/18  Time of notification:  0633  Critical value read back:Yes.    Nurse who received alert:  Henrene Dodge., RN  MD notified (1st page):  Dr. Barbaraann Rondo  Time of first page:  0636  MD notified (2nd page):  Time of second page:  Responding MD:  Dr. Marjie Skiff, no new orders made.  Time MD responded:  [phone]

## 2018-01-18 NOTE — ED Notes (Signed)
Patient transport to floor by Misty Stanley, EDT

## 2018-01-18 NOTE — Progress Notes (Signed)
[facility] [place] [facility]   PATIENT NAME: Alison Russo    MR#:  [id number]  DATE OF BIRTH:  [birth year]  SUBJECTIVE:   Patient came in after she started having significant pain on her left groin. She was a bit hypotensive. She was found to have a large hematoma on the left groin REVIEW OF SYSTEMS:   Review of Systems  Constitutional: Negative for chills, fever and weight loss.  HENT: Negative for ear discharge, ear pain and nosebleeds.   Eyes: Negative for blurred vision, pain and discharge.  Respiratory: Negative for sputum production, shortness of breath, wheezing and stridor.   Cardiovascular: Positive for leg swelling. Negative for chest pain, palpitations, orthopnea and PND.  Gastrointestinal: Negative for abdominal pain, diarrhea, nausea and vomiting.  Genitourinary: Negative for frequency and urgency.  Musculoskeletal: Negative for back pain and joint pain.  Skin:       Large left groin bruise/hematoma  Neurological: Negative for sensory change, speech change, focal weakness and weakness.  Psychiatric/Behavioral: Negative for depression and hallucinations. The patient is not nervous/anxious.    Tolerating Diet:yes Tolerating PT: pending  DRUG ALLERGIES:  No Known Allergies  VITALS:  Blood pressure 118/65, pulse 87, temperature 97.6 F (36.4 C), temperature source Oral, resp. rate 18, height  (1.651 m), weight 92.8 kg (204 lb 8 oz), SpO2 95 %.  PHYSICAL EXAMINATION:   Physical Exam  GENERAL:  84 y.o.-year-old patient lying in the bed with no acute distress.  EYES: Pupils equal, round, reactive to light and accommodation. No scleral icterus. Extraocular muscles intact.  HEENT: Head atraumatic, normocephalic. Oropharynx and nasopharynx clear.  NECK:  Supple, no jugular venous distention. No thyroid enlargement, no tenderness.  LUNGS: Normal breath sounds bilaterally, no wheezing, rales, rhonchi. No use of accessory muscles of  respiration.  CARDIOVASCULAR: S1, S2 normal. No murmurs, rubs, or gallops.  ABDOMEN: Soft, nontender, nondistended. Bowel sounds present. No organomegaly or mass.  EXTREMITIES: No cyanosis, clubbing or edema b/l.   Large brusie/hematoma over the left groin, lower abdomen NEUROLOGIC: Cranial nerves II through XII are intact. No focal Motor or sensory deficits b/l.   PSYCHIATRIC:  patient is alert and oriented x 3.  SKIN: No obvious rash, lesion, or ulcer.   LABORATORY PANEL:  CBC Recent Labs  Lab 01/18/18 0519  WBC 9.8  HGB 7.5*  HCT 22.5*  PLT 183    Chemistries  Recent Labs  Lab 01/17/18 2002 01/18/18 0519  NA 139 139  K 4.0 4.4  CL 112* 111  CO2 18* 24  GLUCOSE 250* 135*  BUN 21* 22*  CREATININE 1.03* 0.83  CALCIUM 8.2* 8.1*  AST 41  --   ALT 22  --   ALKPHOS 120  --   BILITOT 0.7  --    Cardiac Enzymes Recent Labs  Lab 01/17/18 2002  TROPONINI <0.03   RADIOLOGY:  No results found. ASSESSMENT AND PLAN:   Alison Russo  is a 84 y.o. female with a known history of PE, on Coumadin, hypertension, coronary artery disease status post 2 stent placement approximately 3 weeks ago.  Patient was brought to emergency room for acute onset of nausea, diaphoresis and generalized weakness, going on for the past 24 hours.  Her blood pressure was low, 80/60, when paramedics arrived to her house.  She was noted with a large area of bruising and hematoma at left thigh and left groin area, status post left femoral artery catheterization from January 11, 2018.  1.  Left thigh hematoma, status post femoral artery catheterization.  - Will reverse INR.  Patient was given 10 mg vitamin K.  Per discussion with vascular surgery, will check CAT scan at the area, when INR , WNL.  -Patient came in with INR of 7.12--- vitamin K-- 5.1 -vascular surgery consultation with Dr. Gilda Crease pending  2.  Supratherapeutic INR.  Will discontinue Coumadin and give vitamin K to reverse INR.  3.  Anemia,  secondary to acute blood loss.   -came in with hemoglobin of 6.5  transfuse 1 unit  PRBC-- 7.5 - continue to monitor H&H closely.  We will avoid any blood thinners and reverse INR.  4.  Hypotension, secondary to blood loss, responded to fluids.   -Continue IVF and monitor blood pressure closely.  Hold BP meds.  5.  History of PE.  We will have to discontinue blood thinners for now, due to left thigh hematoma and severe anemia.  We will reevaluate the need for anticoagulant in the future.  Coumadin versus IVC fitlert not sure if that is appropriate. Will defer to vascular surgery.   6.  Coronary artery disease, this post recent stent placement.  Stable, continue medical management.  With daughter in the room  Case discussed with Care Management/Social Worker. Management plans discussed with the patient, family and they are in agreement.  CODE STATUS: full  DVT Prophylaxis: SCD  TOTAL TIME TAKING CARE OF THIS PATIENT: 30 minutes.  >50% time spent on counselling and coordination of care  POSSIBLE D/C IN 1-2 DAYS, DEPENDING ON CLINICAL CONDITION.  Note: This dictation was prepared with Dragon dictation along with smaller phrase technology. Any transcriptional errors that result from this process are unintentional.  Enedina Finner M.D on 01/18/2018 at 3:17 PM  Between 7am to 6pm - Pager - [phone]  After 6pm go to [email or web address] - password [email or web address]  [facility]  Office  [phone]  CC: Primary care physician; Lyndon Code, MDPatient ID: Alison Russo, female   DOB: [birth year], 84 y.o.   MRN: [id number]

## 2018-01-19 ENCOUNTER — Inpatient Hospital Stay: Payer: PPO

## 2018-01-19 ENCOUNTER — Encounter: Payer: Self-pay | Admitting: Radiology

## 2018-01-19 DIAGNOSIS — M7981 Nontraumatic hematoma of soft tissue: Secondary | ICD-10-CM

## 2018-01-19 DIAGNOSIS — I2510 Atherosclerotic heart disease of native coronary artery without angina pectoris: Secondary | ICD-10-CM

## 2018-01-19 LAB — PROTIME-INR
INR: 2.64
Prothrombin Time: 28 seconds — ABNORMAL HIGH (ref 11.4–15.2)

## 2018-01-19 LAB — COMPREHENSIVE METABOLIC PANEL
ALBUMIN: 2.6 g/dL — AB (ref 3.5–5.0)
ALT: 16 U/L (ref 14–54)
AST: 20 U/L (ref 15–41)
Alkaline Phosphatase: 102 U/L (ref 38–126)
Anion gap: 4 — ABNORMAL LOW (ref 5–15)
BUN: 20 mg/dL (ref 6–20)
CHLORIDE: 110 mmol/L (ref 101–111)
CO2: 24 mmol/L (ref 22–32)
CREATININE: 0.63 mg/dL (ref 0.44–1.00)
Calcium: 7.9 mg/dL — ABNORMAL LOW (ref 8.9–10.3)
GFR calc Af Amer: >60 mL/min (ref >60)
GFR calc non Af Amer: >60 mL/min (ref >60)
Glucose, Bld: 99 mg/dL (ref 65–99)
POTASSIUM: 4 mmol/L (ref 3.5–5.1)
SODIUM: 138 mmol/L (ref 135–145)
Total Bilirubin: 1.2 mg/dL (ref 0.3–1.2)
Total Protein: 5.1 g/dL — ABNORMAL LOW (ref 6.5–8.1)

## 2018-01-19 LAB — BASIC METABOLIC PANEL
ANION GAP: 4 — AB (ref 5–15)
BUN: 22 mg/dL — ABNORMAL HIGH (ref 6–20)
CALCIUM: 8 mg/dL — AB (ref 8.9–10.3)
CO2: 23 mmol/L (ref 22–32)
Chloride: 110 mmol/L (ref 101–111)
Creatinine, Ser: 0.76 mg/dL (ref 0.44–1.00)
Glucose, Bld: 102 mg/dL — ABNORMAL HIGH (ref 65–99)
Potassium: 3.9 mmol/L (ref 3.5–5.1)
SODIUM: 137 mmol/L (ref 135–145)

## 2018-01-19 LAB — GLUCOSE, CAPILLARY: Glucose-Capillary: 93 mg/dL (ref 65–99)

## 2018-01-19 LAB — PREPARE RBC (CROSSMATCH)

## 2018-01-19 MED ORDER — VITAMIN K1 10 MG/ML IJ SOLN
5.0000 mg | Freq: Once | INTRAMUSCULAR | Status: AC
Start: 2018-01-19 — End: 2018-01-19
  Administered 2018-01-19: 5 mg via SUBCUTANEOUS
  Filled 2018-01-19: qty 0.5

## 2018-01-19 MED ORDER — VITAMIN K1 10 MG/ML IJ SOLN
5.0000 mg | Freq: Once | INTRAMUSCULAR | Status: AC
  Administered 2018-01-19: 5 mg via INTRAVENOUS
  Filled 2018-01-19: qty 0.5

## 2018-01-19 MED ORDER — CEFAZOLIN SODIUM-DEXTROSE 2-4 GM/100ML-% IV SOLN
2.0000 g | INTRAVENOUS | Status: AC
  Administered 2018-01-20: 2 g via INTRAVENOUS
  Filled 2018-01-19: qty 100

## 2018-01-19 MED ORDER — IOPAMIDOL (ISOVUE-370) INJECTION 76%
100.0000 mL | Freq: Once | INTRAVENOUS | Status: AC | PRN
  Administered 2018-01-19: 100 mL via INTRAVENOUS

## 2018-01-19 MED ORDER — FERROUS SULFATE 325 (65 FE) MG PO TABS
325.0000 mg | ORAL_TABLET | Freq: Every day | ORAL | Status: DC
  Administered 2018-01-21 – 2018-01-23 (×3): 325 mg via ORAL
  Filled 2018-01-19 (×3): qty 1

## 2018-01-19 MED ORDER — SODIUM CHLORIDE 0.9 % IV SOLN
INTRAVENOUS | Status: DC
  Administered 2018-01-20: 05:00:00 via INTRAVENOUS

## 2018-01-19 MED ORDER — SODIUM CHLORIDE 0.9 % IV SOLN
Freq: Once | INTRAVENOUS | Status: DC
  Administered 2018-01-19: 14:00:00 via INTRAVENOUS

## 2018-01-19 MED ORDER — ENSURE ENLIVE PO LIQD
237.0000 mL | Freq: Two times a day (BID) | ORAL | Status: DC
  Administered 2018-01-19 – 2018-01-22 (×5): 237 mL via ORAL

## 2018-01-19 NOTE — Progress Notes (Signed)
[facility]  Daily Progress Note   Subjective: Patient laying comfortably in bed.  Patient's family members at bedside.  The patient is currently receiving one unit packed red blood cells.  Patient notes that she feels "very tired".  Patient states that she does not have an appetite.  She denies any shortness of breath or chest pain.  Objective: Vitals:   01/19/18 0851 01/19/18 1311 01/19/18 1340 01/19/18 1417  BP: 133/65 134/67 115/61 101/85  Pulse: 85 83 78 80  Resp:  18 18   Temp: 98.9 F (37.2 C) 98.9 F (37.2 C) 98.7 F (37.1 C) 98.8 F (37.1 C)  TempSrc: Oral Axillary Oral Oral  SpO2: 96% 95% 96% 96%  Weight:      Height:        Intake/Output Summary (Last 24 hours) at 01/19/2018 1545 Last data filed at 01/19/2018 1420 Gross per 24 hour  Intake 1393.75 ml  Output 600 ml  Net 793.75 ml   Physical Exam: A&Ox3, NAD CV: RRR Pulmonary: CTA Bilaterally Abdomen: Soft, Nontender, Nondistended GU: Suction foley in place. Vascular:  Left lower extremity: Large area of ecchymosis noted.  Ecchymosis is noted from approximately mid-abdomen tracking laterally toward the left flank and distally toward about mid thigh.  This area is tender to palpation.  There is no active bleeding noted from the area.  The thigh is soft.  The calf is soft.  There is no acute vascular compromise to the left lower extremity.  Palpable pedal pulses noted  Right lower extremity: Unna wraps on.  Minimal edema noted to the right lower extremity.  Palpable pedal pulses noted.   Laboratory: CBC    Component Value Date/Time   WBC 9.8 01/18/2018 0519   HGB 6.4 (L) 01/19/2018 0734   HGB 11.3 09/28/2017 1259   HCT 21.1 (L) 01/19/2018 0734   HCT 36.4 09/28/2017 1259   PLT 183 01/18/2018 0519   PLT 214 09/28/2017 1259   BMET    Component Value Date/Time   NA 137 01/19/2018 0734   NA 138 01/19/2018 0734   NA 143 09/28/2017 1259   K 3.9 01/19/2018 0734   K 4.0 01/19/2018 0734   CL 110 01/19/2018 0734   CL 110 01/19/2018 0734   CO2 23 01/19/2018 0734   CO2 24 01/19/2018 0734   GLUCOSE 102 (H) 01/19/2018 0734   GLUCOSE 99 01/19/2018 0734   BUN 22 (H) 01/19/2018 0734   BUN 20 01/19/2018 0734   BUN 16 09/28/2017 1259   CREATININE 0.76 01/19/2018 0734   CREATININE 0.63 01/19/2018 0734   CALCIUM 8.0 (L) 01/19/2018 0734   CALCIUM 7.9 (L) 01/19/2018 0734   GFRNONAA >60 01/19/2018 0734   GFRNONAA >60 01/19/2018 0734   GFRAA >60 01/19/2018 0734   GFRAA >60 01/19/2018 0734   Assessment/Planning: The patient is a 84 year old female admitted to [facility] status post a right lower extremity angiogram on 01/11/2018 which ruled out right femoral pseudoaneurysm.  Patient experienced progressively worsening swelling and bruising to the left groin which prompted her to seek medical attention.  Patient underwent a CTA today which was notable for a pseudoaneurysm of the left common femoral artery 1) left common femoral artery pseudoaneurysm: We will plan on thrombin injection tomorrow with Dr. Gilda Crease.  Procedure, risks and benefits explained to the patient and her family members at the bedside.  All questions answered.  The patient and the family wish to proceed. 2) patient's INR continues to improve.  Today's INR: 2.64 3) patient with anemia.  Hemoglobin 6.4.  The patient is currently receiving 1 unit packed red blood cells.  I have ordered labs for the morning.   4) I will add Ensure feeding supplement to the patient's diet as the patient does not have an appetite as per the family's request. 5) I will also add an iron supplement 6) would check a posttransfusion H&H 7) I ordered physical therapy and occupational therapy evaluation to start on Saturday.  I do not think the patient will have the energy to start either tomorrow.  Will need recommendations from physical therapy in regard to the patient's discharge whether it is safe for her to go home or if  SNF/rehab is recommended. 8) Unna wrap to the right lower extremity changed weekly.  Discussed with Dr. Charlie Pitter Austin Herd PA-C 01/19/2018 3:45 PM

## 2018-01-19 NOTE — Progress Notes (Signed)
[facility] [place] [facility]   PATIENT NAME: Alison Russo    MR#:  [id number]  DATE OF BIRTH:  1925/11/19  SUBJECTIVE:   Patient came in after she started having significant pain on her left groin. She was a bit hypotensive. She was found to have a large hematoma on the left groin Continues to have pain. I am not feeling good due to pain. dter in the room REVIEW OF SYSTEMS:   Review of Systems  Constitutional: Negative for chills, fever and weight loss.  HENT: Negative for ear discharge, ear pain and nosebleeds.   Eyes: Negative for blurred vision, pain and discharge.  Respiratory: Negative for sputum production, shortness of breath, wheezing and stridor.   Cardiovascular: Positive for leg swelling. Negative for chest pain, palpitations, orthopnea and PND.  Gastrointestinal: Negative for abdominal pain, diarrhea, nausea and vomiting.  Genitourinary: Negative for frequency and urgency.  Musculoskeletal: Negative for back pain and joint pain.  Skin:       Large left groin bruise/hematoma  Neurological: Negative for sensory change, speech change, focal weakness and weakness.  Psychiatric/Behavioral: Negative for depression and hallucinations. The patient is not nervous/anxious.    Tolerating Diet:yes Tolerating PT: pending  DRUG ALLERGIES:  No Known Allergies  VITALS:  Blood pressure 101/85, pulse 80, temperature 98.8 F (37.1 C), temperature source Oral, resp. rate 18, height  (1.651 m), weight 92.1 kg (203 lb), SpO2 96 %.  PHYSICAL EXAMINATION:   Physical Exam  GENERAL:  84 y.o.-year-old patient lying in the bed with no acute distress.  EYES: Pupils equal, round, reactive to light and accommodation. No scleral icterus. Extraocular muscles intact.  HEENT: Head atraumatic, normocephalic. Oropharynx and nasopharynx clear.  NECK:  Supple, no jugular venous distention. No thyroid enlargement, no tenderness.  LUNGS: Normal breath sounds  bilaterally, no wheezing, rales, rhonchi. No use of accessory muscles of respiration.  CARDIOVASCULAR: S1, S2 normal. No murmurs, rubs, or gallops.  ABDOMEN: Soft, nontender, nondistended. Bowel sounds present. No organomegaly or mass.  EXTREMITIES: No cyanosis, clubbing or edema b/l.   Large brusie/hematoma over the left groin, lower abdomen NEUROLOGIC: Cranial nerves II through XII are intact. No focal Motor or sensory deficits b/l.   PSYCHIATRIC:  patient is alert and oriented x 3.  SKIN: No obvious rash, lesion, or ulcer. As above  LABORATORY PANEL:  CBC Recent Labs  Lab 01/18/18 0519 01/19/18 0734  WBC 9.8  --   HGB 7.5* 6.4*  HCT 22.5* 21.1*  PLT 183  --     Chemistries  Recent Labs  Lab 01/19/18 0734  NA 138  137  K 4.0  3.9  CL 110  110  CO2 24  23  GLUCOSE 99  102*  BUN 20  22*  CREATININE 0.63  0.76  CALCIUM 7.9*  8.0*  AST 20  ALT 16  ALKPHOS 102  BILITOT 1.2   Cardiac Enzymes Recent Labs  Lab 01/17/18 2002  TROPONINI <0.03   RADIOLOGY:  Ct Angio Abd/pel W/ And/or W/o  Result Date: 01/19/2018 CLINICAL DATA:  Severe left leg pain and swelling. Cardiac catheterization feel left groin 1 month ago. EXAM: CTA ABDOMEN AND PELVIS wITHOUT AND WITH CONTRAST TECHNIQUE: Multidetector CT imaging of the abdomen and pelvis was performed using the standard protocol during bolus administration of intravenous contrast. Multiplanar reconstructed images and MIPs were obtained and reviewed to evaluate the vascular anatomy. CONTRAST:  ISOVUE-370 IOPAMIDOL (ISOVUE-370) INJECTION 76% COMPARISON:  12/24/2017 FINDINGS: VASCULAR  Aorta: Nonaneurysmal and patent. Diffuse atherosclerotic calcifications. Celiac: Mild narrowing at the origin secondary to median arcuate ligament syndrome and atherosclerotic calcified plaque. Branch vessels grossly patent. SMA: Atherosclerotic calcifications at the origin.  Patent. Renals: Single renal arteries are patent with some  atherosclerotic changes at their origins. IMA: Patent. Inflow: There are atherosclerotic calcifications throughout the bilateral common and external iliac arteries without significant narrowing or aneurysmal dilatation. Internal iliac arteries are also patent with diffuse atherosclerotic calcifications. Proximal Outflow: There is a large pseudoaneurysm emanating from the left common femoral artery. It measures up to 5.8 cm in size. Contrast fills the pseudoaneurysm with increasing contrast opacification on delayed phase images. The bilateral common femoral arteries are patent. Visualized superficial femoral arteries are patent. Stranding in the right inguinal region consistent with the prior visualized hematoma is improved. Veins: No evidence of DVT. There is narrowing of the left common femoral vein due to the pseudoaneurysm. Review of the MIP images confirms the above findings. NON-VASCULAR Lower chest: Dependent atelectasis.  Small pleural effusions. Hepatobiliary: Tiny hypodensity in the medial segment of the left lobe adjacent to the gallbladder is stable in retrospect compared with 05/25/2007. Gallbladder is within normal limits. Pancreas: Complex cystic mass emanating from the head of the pancreas measures 1.7 cm on image 26 of series 5. The remainder of the pancreas is atrophic. There may be a second cystic mass more inferiorly emanating from the head of the pancreas on image 91 of series 4. Spleen: Unremarkable Adrenals/Urinary Tract: There is prominence of the adrenal glands in a hyperplasia pattern. Scattered minimal scarring in the kidneys. Bladder diverticulum is noted to the left. Bladder is relatively decompressed. Stomach/Bowel: Moderate hiatal hernia. No obvious mass in the colon. No evidence of small-bowel obstruction. Lymphatic: No abnormal retroperitoneal adenopathy. Reproductive: Calcified fibroids. Other: There is stranding within the fat surrounding the left inguinal pseudoaneurysm. There is  thickening of the musculature. These findings are all consistent with soft tissue hemorrhage. Hemorrhage within the right inguinal region has improved. Musculoskeletal: No vertebral compression deformity. Degenerative changes of the facet joints in the lumbar region are noted. IMPRESSION: VASCULAR Large 5.8 cm left inguinal pseudoaneurysm likely related to the recent cardiac catheterization. Critical Value/emergent results were called by telephone at the time of interpretation on 01/19/2018 at 10:35 am to Dr. Orvan Falconer, RN, who verbally acknowledged these results. NON-VASCULAR There is hemorrhage in the surrounding tissues of the left thigh and inguinal region. Right inguinal hematoma has improved. New cystic mass in the head of the pancreas. There is possibly a second lesion. Malignancy is not excluded. Pancreas MRI protocol is recommended. Stable tiny hypodensity in the liver supporting benign etiology. Electronically Signed   By: Jolaine Click M.D.   On: 01/19/2018 10:36   ASSESSMENT AND PLAN:   Jakaylee Sasaki  is a 84 y.o. female with a known history of PE, on Coumadin, hypertension, coronary artery disease status post 2 stent placement approximately 3 weeks ago.  Patient was brought to emergency room for acute onset of nausea, diaphoresis and generalized weakness, going on for the past 24 hours.  Her blood pressure was low, 80/60, when paramedics arrived to her house.  She was noted with a large area of bruising and hematoma at left thigh and left groin area, status post left femoral artery catheterization from January 11, 2018.  1.  Left thigh hematoma, status post femoral artery catheterization. due to Large Pseudoaneurysm - Will reverse INR.  Patient was given 10 mg vitamin K in the  ER - Per discussion with vascular surgery pt will likely need thrombin injected into the pseudoaneurysm once INR <1.4 -vitamin K 5 mg sq today -Patient came in with INR of 7.12--- vitamin K-- 5.1--2.64--vitamin K 5 mg SQ  today  2.  Supratherapeutic INR. -  Will discontinue Coumadin and give vitamin K to reverse INR.  3.  Anemia, secondary to acute blood loss.   -came in with hemoglobin of 6.5  transfuse 1 unit  PRBC-- 7.5--6.4--2nd unit - continue to monitor H&H closely.    4.  Hypotension, secondary to blood loss, responded to fluids.   -  Hold BP meds.  5.  History of PE.  We will have to discontinue blood thinners for now, due to left thigh hematoma and severe anemia.  We will reevaluate the need for anticoagulant in the future.    6.  Coronary artery disease, this post recent stent placement.  Stable, continue medical management.  With daughter in the room D/w dr schnier  Case discussed with Care Management/Social Worker. Management plans discussed with the patient, family and they are in agreement.  CODE STATUS: full  DVT Prophylaxis: SCD  TOTAL TIME TAKING CARE OF THIS PATIENT: 30 minutes.  >50% time spent on counselling and coordination of care  POSSIBLE D/C IN few DAYS, DEPENDING ON CLINICAL CONDITION.  Note: This dictation was prepared with Dragon dictation along with smaller phrase technology. Any transcriptional errors that result from this process are unintentional.  Enedina Finner M.D on 01/19/2018 at 2:43 PM  Between 7am to 6pm - Pager - [phone]  After 6pm go to [email or web address] - password [email or web address]  [facility]  Office  [phone]  CC: Primary care physician; Lyndon Code, MDPatient ID: [name], female   DOB: Feb 08, 1926, 84 y.o.   MRN: [id number]

## 2018-01-19 NOTE — Progress Notes (Signed)
Dr. Allena Katz made aware of H/H results.  1040 [place] PA aware of CT result

## 2018-01-19 NOTE — Progress Notes (Signed)
repaged Dr. Allena Katz for H/H result. Awaiting response

## 2018-01-20 ENCOUNTER — Inpatient Hospital Stay: Payer: PPO

## 2018-01-20 ENCOUNTER — Encounter
Admission: EM | Disposition: A | Discharge status: Skilled Nursing Facility | Payer: Self-pay | Source: Home / Self Care | Attending: Internal Medicine

## 2018-01-20 DIAGNOSIS — M7981 Nontraumatic hematoma of soft tissue: Secondary | ICD-10-CM

## 2018-01-20 HISTORY — PX: PSEUDOANERYSM COMPRESSION: CATH118259

## 2018-01-20 LAB — BPAM RBC
BLOOD PRODUCT EXPIRATION DATE: 201905072359
Blood Product Expiration Date: 201905202359
ISSUE DATE / TIME: 201905021351
ISSUE DATE / TIME: 201905010149
UNIT TYPE AND RH: 6200
UNIT TYPE AND RH: 9500

## 2018-01-20 LAB — CBC
HCT: 22.9 % — ABNORMAL LOW (ref 35.0–47.0)
Hemoglobin: 7.6 g/dL — ABNORMAL LOW (ref 12.0–16.0)
MCH: 27.9 pg (ref 26.0–34.0)
MCHC: 33.4 g/dL (ref 32.0–36.0)
MCV: 83.5 fL (ref 80.0–100.0)
Platelets: 171 10*3/uL (ref 150–440)
RBC: 2.74 MIL/uL — ABNORMAL LOW (ref 3.80–5.20)
RDW: 17.5 % — AB (ref 11.5–14.5)
WBC: 10.2 10*3/uL (ref 3.6–11.0)

## 2018-01-20 LAB — TYPE AND SCREEN
ABO/RH(D): A POS
ANTIBODY SCREEN: NEGATIVE
UNIT DIVISION: 0
Unit division: 0

## 2018-01-20 LAB — PROTIME-INR
INR: 1.29
PROTHROMBIN TIME: 16 s — AB (ref 11.4–15.2)

## 2018-01-20 LAB — APTT: APTT: 31 s (ref 24–36)

## 2018-01-20 LAB — BASIC METABOLIC PANEL
Anion gap: 4 — ABNORMAL LOW (ref 5–15)
BUN: 14 mg/dL (ref 6–20)
CALCIUM: 8.1 mg/dL — AB (ref 8.9–10.3)
CHLORIDE: 109 mmol/L (ref 101–111)
CO2: 26 mmol/L (ref 22–32)
CREATININE: 0.64 mg/dL (ref 0.44–1.00)
GFR calc non Af Amer: >60 mL/min (ref >60)
Glucose, Bld: 117 mg/dL — ABNORMAL HIGH (ref 65–99)
Potassium: 3.5 mmol/L (ref 3.5–5.1)
SODIUM: 139 mmol/L (ref 135–145)

## 2018-01-20 LAB — HEMOGLOBIN AND HEMATOCRIT, BLOOD
HEMATOCRIT: 21.1 % — AB (ref 35.0–47.0)
Hemoglobin: 6.4 g/dL — ABNORMAL LOW (ref 12.0–16.0)

## 2018-01-20 LAB — MAGNESIUM: Magnesium: 1.8 mg/dL (ref 1.7–2.4)

## 2018-01-20 SURGERY — PSEUDOANERYSM COMPRESSION
Anesthesia: Moderate Sedation | Laterality: Left

## 2018-01-20 MED ORDER — THROMBIN (RECOMBINANT) 5000 UNITS EX SOLR
CUTANEOUS | Status: DC | PRN
  Administered 2018-01-20: 5000 [IU] via TOPICAL

## 2018-01-20 MED ORDER — MIDAZOLAM HCL 2 MG/2ML IJ SOLN
INTRAMUSCULAR | Status: AC
  Filled 2018-01-20: qty 2

## 2018-01-20 MED ORDER — FENTANYL CITRATE (PF) 100 MCG/2ML IJ SOLN
INTRAMUSCULAR | Status: DC | PRN
  Administered 2018-01-20: 50 ug via INTRAVENOUS
  Administered 2018-01-20: 12.5 ug via INTRAVENOUS

## 2018-01-20 MED ORDER — THROMBIN (RECOMBINANT) 5000 UNITS EX SOLR: 5000.0000 [IU] | Freq: Once | CUTANEOUS | Status: DC

## 2018-01-20 MED ORDER — MIDAZOLAM HCL 2 MG/2ML IJ SOLN
INTRAMUSCULAR | Status: DC | PRN
  Administered 2018-01-20: 1 mg via INTRAVENOUS
  Administered 2018-01-20: 2 mg via INTRAVENOUS

## 2018-01-20 MED ORDER — FENTANYL CITRATE (PF) 100 MCG/2ML IJ SOLN
INTRAMUSCULAR | Status: AC
  Filled 2018-01-20: qty 2

## 2018-01-20 SURGICAL SUPPLY — 3 items
NEEDLE ENTRY 21GA 7CM ECHOTIP (NEEDLE) ×3 IMPLANT
SET INTRO CAPELLA COAXIAL (SET/KITS/TRAYS/PACK) ×3 IMPLANT
TRAY LACERAT/PLASTIC (MISCELLANEOUS) ×3 IMPLANT

## 2018-01-20 NOTE — NC FL2 (Signed)
[place] MEDICAID FL2 LEVEL OF CARE SCREENING TOOL     IDENTIFICATION  Patient Name: Alison Russo Birthdate: 22-Oct-1925 Sex: female Admission Date (Current Location): 01/17/2018  [place] and [place] Number:  Chiropodist and Address:  [facility], [address], [place], [place] [address]      Provider Number: [id number]  Attending Physician Name and Address:  Enedina Finner, MD  Relative Name and Phone Number:       Current Level of Care: Hospital Recommended Level of Care: Skilled Nursing Facility Prior Approval Number:    Date Approved/Denied:   PASRR Number: [id number])  Discharge Plan: SNF    Current Diagnoses: Patient Active Problem List   Diagnosis Date Noted  . Hematoma of leg, left, initial encounter 01/17/2018  . Leg pain 01/09/2018  . Edema of right lower extremity 01/09/2018  . Atrial fibrillation (HCC) 12/28/2017  . Heart murmur 12/28/2017  . Tachycardia 12/28/2017  . CAD (coronary artery disease) 12/25/2017  . Essential hypertension 12/25/2017  . Hyperlipidemia 12/25/2017  . History of pulmonary embolism 12/25/2017  . Hematoma of groin 12/25/2017  . Anemia 12/25/2017  . Chronic anticoagulation 12/22/2017  . NSTEMI (non-ST elevated myocardial infarction) (HCC) 12/21/2017  . Contusion of right knee 02/27/2016  . 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    Orientation RESPIRATION BLADDER Height & Weight     Self, Time, Situation, Place  Normal Continent Weight: 203 lb (92.1 kg) Height:   (165.1 cm)  BEHAVIORAL SYMPTOMS/MOOD NEUROLOGICAL BOWEL NUTRITION STATUS      Continent Diet(Diet: Regular )  AMBULATORY STATUS COMMUNICATION OF NEEDS Skin   Extensive Assist Verbally Surgical wounds                       Personal Care Assistance Level of Assistance  Bathing, Feeding, Dressing Bathing Assistance: Limited assistance Feeding assistance: Independent Dressing Assistance: Limited assistance      Functional Limitations Info  Sight, Hearing, Speech Sight Info: Adequate Hearing Info: Adequate Speech Info: Adequate    SPECIAL CARE FACTORS FREQUENCY  PT (By licensed PT), OT (By licensed OT)     PT Frequency: (5) OT Frequency: (5)            Contractures      Additional Factors Info  Code Status, Allergies Code Status Info: (Full Code. ) Allergies Info: (No Known Allergies. )           Current Medications (01/20/2018):  This is the current hospital active medication list Current Facility-Administered Medications  Medication Dose Route Frequency Provider Last Rate Last Dose  . 0.9 %  sodium chloride infusion   Intravenous Continuous Schnier, Latina Craver, MD 75 mL/hr at 01/20/18 [phone]    . acetaminophen (TYLENOL) tablet 650 mg  650 mg Oral Q6H PRN Schnier, Latina Craver, MD       Or  . acetaminophen (TYLENOL) suppository 650 mg  650 mg Rectal Q6H PRN Schnier, Latina Craver, MD      . atorvastatin (LIPITOR) tablet 80 mg  80 mg Oral q1800 Renford Dills, MD   80 mg at 01/19/18 1727  . bisacodyl (DULCOLAX) EC tablet 5 mg  5 mg Oral Daily PRN Schnier, Latina Craver, MD      . cholecalciferol (VITAMIN D) tablet 2,000 Units  2,000 Units Oral Daily Schnier, Latina Craver, MD   2,000 Units at 01/20/18 1028  . docusate sodium (COLACE) capsule 100 mg  100 mg Oral BID Schnier, Latina Craver, MD  100 mg at 01/20/18 1029  . feeding supplement (ENSURE ENLIVE) (ENSURE ENLIVE) liquid 237 mL  237 mL Oral BID BM Schnier, Latina Craver, MD   237 mL at 01/20/18 1029  . ferrous sulfate tablet 325 mg  325 mg Oral Q breakfast Schnier, Latina Craver, MD      . HYDROcodone-acetaminophen (NORCO/VICODIN) 5-325 MG per tablet 1-2 tablet  1-2 tablet Oral Q4H PRN Schnier, Latina Craver, MD   1 tablet at 01/18/18 1740  . loratadine (CLARITIN) tablet 10 mg  10 mg Oral Daily Schnier, Latina Craver, MD   10 mg at 01/20/18 1029  . metoprolol tartrate (LOPRESSOR) tablet 12.5 mg  12.5 mg Oral BID Schnier, Latina Craver, MD   12.5 mg at 01/20/18  1029  . multivitamin-lutein (OCUVITE-LUTEIN) capsule   Oral BID Schnier, Latina Craver, MD   1 capsule at 01/20/18 1029  . nitroGLYCERIN (NITROSTAT) SL tablet 0.4 mg  0.4 mg Sublingual Q5 Min x 3 PRN Schnier, Latina Craver, MD      . ondansetron [facility]) tablet 4 mg  4 mg Oral Q6H PRN Schnier, Latina Craver, MD       Or  . ondansetron [facility]) injection 4 mg  4 mg Intravenous Q6H PRN Schnier, Latina Craver, MD      . pantoprazole (PROTONIX) EC tablet 40 mg  40 mg Oral Daily Schnier, Latina Craver, MD   40 mg at 01/20/18 1029  . sertraline (ZOLOFT) tablet 100 mg  100 mg Oral Daily Schnier, Latina Craver, MD   100 mg at 01/20/18 1028  . tetrahydrozoline 0.05 % ophthalmic solution 1 drop  1 drop Both Eyes TID PRN Schnier, Latina Craver, MD      . thrombin recombinant (RECOTHROM) solution syringe 5,000 Units  5,000 Units Topical Once Schnier, Latina Craver, MD      . traZODone (DESYREL) tablet 25 mg  25 mg Oral QHS PRN Gilda Crease Latina Craver, MD   25 mg at 01/19/18 2119  . vitamin B-12 (CYANOCOBALAMIN) tablet 2,500 mcg  2,500 mcg Oral Daily Schnier, Latina Craver, MD   2,500 mcg at 01/20/18 1028  . vitamin C (ASCORBIC ACID) tablet 500 mg  500 mg Oral Daily Schnier, Latina Craver, MD   500 mg at 01/20/18 1029     Discharge Medications: Please see discharge summary for a list of discharge medications.  Relevant Imaging Results:  Relevant Lab Results:   Additional Information (SSN: [id number])  Brandis Wixted, Darleen Crocker, LCSW

## 2018-01-20 NOTE — Progress Notes (Signed)
Uneventful night. Pt slept in intervals. Left thigh into groin and lower abd remain swollen, firm and purple bruised; area is tender to touch. Pt able to turn self in be side to side with minimal assist. Npo since midnight and pt understand this purpose for procedure today. Call [place] in reach.

## 2018-01-20 NOTE — Clinical Social Work Note (Signed)
Clinical Social Work Assessment  Patient Details  Name: Alison Russo MRN: [id number] Date of Birth: [birth year]  Date of referral:  01/20/18               Reason for consult:  Facility Placement                Permission sought to share information with:    Permission granted to share information::     Name::        Agency::     Relationship::     Contact Information:     Housing/Transportation Living arrangements for the past 2 months:  Single Family Home Source of Information:  Patient, Adult Children Patient Interpreter Needed:  None Criminal Activity/Legal Involvement Pertinent to Current Situation/Hospitalization:  No - Comment as needed Significant Relationships:  Adult Children Lives with:  Self Do you feel safe going back to the place where you live?  Yes Need for family participation in patient care:  Yes (Comment)  Care giving concerns:  Patient lives alone in [place].    Social Worker assessment / plan:  Holiday representative ([place]) received verbal consult from nurse in progression rounds that patient may need SNF. PT is pending. CSW met with patient and her son Alison Russo was at bedside. Patient was alert and oriented X4 and was laying in the bed. CSW introduced self and explained role of CSW department. Patient reported that she lives alone and has 2 adult children, her son Alison Russo and her daughter Alison Russo. Per patient her daughter Alison Russo is her HPOA and Alison Russo lives 10 miles from patient. CSW explained that PT will likely work with patient and make a recommendation of home health or SNF. Patient reported that she does not want to go to SNF and has lots of support from her family and church friends at home. Patient was was walking independently prior to hospitalization and is in good health. CSW will continue to follow and assist as needed.   Employment status:  Retired Nurse, adult PT Recommendations:  Not assessed at this time Information /  Referral to community resources:  Other (Comment Required)(Patient prefers to go home. )  Patient/Family's Response to care:  Patient prefers to go home.   Patient/Family's Understanding of and Emotional Response to Diagnosis, Current Treatment, and Prognosis:  Patient was very pleasant and thanked CSW for visit.   Emotional Assessment Appearance:  Appears stated age Attitude/Demeanor/Rapport:    Affect (typically observed):  Accepting, Adaptable, Pleasant Orientation:  Oriented to Self, Oriented to Place, Oriented to  Time, Oriented to Situation Alcohol / Substance use:  Not Applicable Psych involvement (Current and /or in the community):  No (Comment)  Discharge Needs  Concerns to be addressed:  Discharge Planning Concerns Readmission within the last 30 days:  No Current discharge risk:  Dependent with Mobility Barriers to Discharge:  Continued Medical Work up   [employer], Veronia Beets, LCSW 01/20/2018, 12:11 PM

## 2018-01-20 NOTE — Progress Notes (Signed)
[facility] - [place] at [facility]   PATIENT NAME: Alison Russo    MR#:  [id number]  DATE OF BIRTH:  11-Oct-1925  SUBJECTIVE:   Patient feels better today. She ate some food after the procedure. Less pain. dter in the room REVIEW OF SYSTEMS:   Review of Systems  Constitutional: Negative for chills, fever and weight loss.  HENT: Negative for ear discharge, ear pain and nosebleeds.   Eyes: Negative for blurred vision, pain and discharge.  Respiratory: Negative for sputum production, shortness of breath, wheezing and stridor.   Cardiovascular: Positive for leg swelling. Negative for chest pain, palpitations, orthopnea and PND.  Gastrointestinal: Negative for abdominal pain, diarrhea, nausea and vomiting.  Genitourinary: Negative for frequency and urgency.  Musculoskeletal: Negative for back pain and joint pain.  Skin:       Large left groin bruise/hematoma  Neurological: Negative for sensory change, speech change, focal weakness and weakness.  Psychiatric/Behavioral: Negative for depression and hallucinations. The patient is not nervous/anxious.    Tolerating Diet:yes Tolerating PT: pending  DRUG ALLERGIES:  No Known Allergies  VITALS:  Blood pressure 123/68, pulse 79, temperature 98.3 F (36.8 C), resp. rate 18, height  (1.651 m), weight 92.1 kg (203 lb), SpO2 96 %.  PHYSICAL EXAMINATION:   Physical Exam  GENERAL:  84 y.o.-year-old patient lying in the bed with no acute distress.  EYES: Pupils equal, round, reactive to light and accommodation. No scleral icterus. Extraocular muscles intact.  HEENT: Head atraumatic, normocephalic. Oropharynx and nasopharynx clear.  NECK:  Supple, no jugular venous distention. No thyroid enlargement, no tenderness.  LUNGS: Normal breath sounds bilaterally, no wheezing, rales, rhonchi. No use of accessory muscles of respiration.  CARDIOVASCULAR: S1, S2 normal. No murmurs, rubs, or gallops.  ABDOMEN: Soft,  nontender, nondistended. Bowel sounds present. No organomegaly or mass.  EXTREMITIES: No cyanosis, clubbing or edema b/l.    Large brusie/hematoma over the left groin, lower abdomen NEUROLOGIC: Cranial nerves II through XII are intact. No focal Motor or sensory deficits b/l.   PSYCHIATRIC:  patient is alert and oriented x 3.  SKIN: No obvious rash, lesion, or ulcer. As above  LABORATORY PANEL:  CBC Recent Labs  Lab 01/20/18 0441  WBC 10.2  HGB 7.6*  HCT 22.9*  PLT 171    Chemistries  Recent Labs  Lab 01/19/18 0734 01/20/18 0441  NA 138  137 139  K 4.0  3.9 3.5  CL 110  110 109  CO2 GLUCOSE 99  102* 117*  BUN 20  22* 14  CREATININE 0.63  0.76 0.64  CALCIUM 7.9*  8.0* 8.1*  MG  --  1.8  AST 20  --   ALT 16  --   ALKPHOS 102  --   BILITOT 1.2  --    Cardiac Enzymes Recent Labs  Lab 01/17/18 2002  TROPONINI <0.03   RADIOLOGY:  Ct Angio Abd/pel W/ And/or W/o  Result Date: 01/19/2018 CLINICAL DATA:  Severe left leg pain and swelling. Cardiac catheterization feel left groin 1 month ago. EXAM: CTA ABDOMEN AND PELVIS wITHOUT AND WITH CONTRAST TECHNIQUE: Multidetector CT imaging of the abdomen and pelvis was performed using the standard protocol during bolus administration of intravenous contrast. Multiplanar reconstructed images and MIPs were obtained and reviewed to evaluate the vascular anatomy. CONTRAST:  ISOVUE-370 IOPAMIDOL (ISOVUE-370) INJECTION 76% COMPARISON:  12/24/2017 FINDINGS: VASCULAR Aorta: Nonaneurysmal and patent. Diffuse atherosclerotic calcifications. Celiac: Mild narrowing at the origin  secondary to median arcuate ligament syndrome and atherosclerotic calcified plaque. Branch vessels grossly patent. SMA: Atherosclerotic calcifications at the origin.  Patent. Renals: Single renal arteries are patent with some atherosclerotic changes at their origins. IMA: Patent. Inflow: There are atherosclerotic calcifications throughout the bilateral  common and external iliac arteries without significant narrowing or aneurysmal dilatation. Internal iliac arteries are also patent with diffuse atherosclerotic calcifications. Proximal Outflow: There is a large pseudoaneurysm emanating from the left common femoral artery. It measures up to 5.8 cm in size. Contrast fills the pseudoaneurysm with increasing contrast opacification on delayed phase images. The bilateral common femoral arteries are patent. Visualized superficial femoral arteries are patent. Stranding in the right inguinal region consistent with the prior visualized hematoma is improved. Veins: No evidence of DVT. There is narrowing of the left common femoral vein due to the pseudoaneurysm. Review of the MIP images confirms the above findings. NON-VASCULAR Lower chest: Dependent atelectasis.  Small pleural effusions. Hepatobiliary: Tiny hypodensity in the medial segment of the left lobe adjacent to the gallbladder is stable in retrospect compared with 05/25/2007. Gallbladder is within normal limits. Pancreas: Complex cystic mass emanating from the head of the pancreas measures 1.7 cm on image 26 of series 5. The remainder of the pancreas is atrophic. There may be a second cystic mass more inferiorly emanating from the head of the pancreas on image 91 of series 4. Spleen: Unremarkable Adrenals/Urinary Tract: There is prominence of the adrenal glands in a hyperplasia pattern. Scattered minimal scarring in the kidneys. Bladder diverticulum is noted to the left. Bladder is relatively decompressed. Stomach/Bowel: Moderate hiatal hernia. No obvious mass in the colon. No evidence of small-bowel obstruction. Lymphatic: No abnormal retroperitoneal adenopathy. Reproductive: Calcified fibroids. Other: There is stranding within the fat surrounding the left inguinal pseudoaneurysm. There is thickening of the musculature. These findings are all consistent with soft tissue hemorrhage. Hemorrhage within the right inguinal  region has improved. Musculoskeletal: No vertebral compression deformity. Degenerative changes of the facet joints in the lumbar region are noted. IMPRESSION: VASCULAR Large 5.8 cm left inguinal pseudoaneurysm likely related to the recent cardiac catheterization. Critical Value/emergent results were called by telephone at the time of interpretation on 01/19/2018 at 10:35 am to Dr. Orvan Falconer, RN, who verbally acknowledged these results. NON-VASCULAR There is hemorrhage in the surrounding tissues of the left thigh and inguinal region. Right inguinal hematoma has improved. New cystic mass in the head of the pancreas. There is possibly a second lesion. Malignancy is not excluded. Pancreas MRI protocol is recommended. Stable tiny hypodensity in the liver supporting benign etiology. Electronically Signed   By: Jolaine Click M.D.   On: 01/19/2018 10:36   ASSESSMENT AND PLAN:   Shasha Buchbinder  is a 84 y.o. female with a known history of PE, on Coumadin, hypertension, coronary artery disease status post 2 stent placement approximately 3 weeks ago.  Patient was brought to emergency room for acute onset of nausea, diaphoresis and generalized weakness, going on for the past 24 hours.  Her blood pressure was low, 80/60, when paramedics arrived to her house.  She was noted with a large area of bruising and hematoma at left thigh and left groin area, status post left femoral artery catheterization from January 11, 2018.  1.  Left thigh hematoma, status post femoral artery catheterization. due to Large Pseudoaneurysm - Will reverse INR.  Patient was given 10 mg vitamin K in the ER - Per discussion with vascular surgery pt is s/p  thrombin injected  into the pseudoaneurysm on may 3 rd 2019 -Patient came in with INR of 7.12--- vitamin K-- 5.1--2.64--vitamin K 5 mg SQ -- 1.29  2.  Supratherapeutic INR. -  Will discontinue Coumadin and give vitamin K to reverse INR.  3.  Anemia, secondary to acute blood loss.   -came in  with hemoglobin of 6.5  transfuse 1 unit  PRBC-- 7.5--6.4--2nd unit-- 7.6 - continue to monitor H&H closely.    4.  Hypotension, secondary to blood loss, responded to fluids.   -BP improved. D/c IVF -resumed BP meds before d/c if goes up  5.  History of PE.   -We will have to discontinue blood thinners for now, due to left thigh hematoma and severe anemia. -We will reevaluate the need for anticoagulant in the future.    6.  Coronary artery disease, this post recent stent placement.  - Stable, continue medical management.  With daughter in the room  Case discussed with Care Management/Social Worker. Management plans discussed with the patient, family and they are in agreement.  CODE STATUS: full  DVT Prophylaxis: SCD  TOTAL TIME TAKING CARE OF THIS PATIENT: 30 minutes.  >50% time spent on counselling and coordination of care  POSSIBLE D/C IN few DAYS, DEPENDING ON CLINICAL CONDITION.  Note: This dictation was prepared with Dragon dictation along with smaller phrase technology. Any transcriptional errors that result from this process are unintentional.  Enedina Finner M.D on 01/20/2018 at 3:08 PM  Between 7am to 6pm - Pager - [phone]  After 6pm go to [email or web address] - password [email or web address]  [facility]  Office  [phone]  CC: Primary care physician; Lyndon Code, MDPatient ID: [name], female   DOB: 27-Mar-1926, 84 y.o.   MRN: [id number]

## 2018-01-20 NOTE — Op Note (Signed)
    OPERATIVE NOTE   PROCEDURE: Thrombin injection left femoral pseudoaneurysm  PRE-OPERATIVE DIAGNOSIS: Left femoral pseudoaneurysm  POST-OPERATIVE DIAGNOSIS: Same  SURGEON: Levora Dredge  ASSISTANT(S): None  ANESTHESIA: IV sedation  ESTIMATED BLOOD LOSS: Minimal cc  FINDING(S): Large pseudoaneurysm left groin  SPECIMEN(S): None  INDICATIONS:   Alison Russo is a 84 y.o. female who presents with a large left groin pseudoaneurysm.  Initially a presentation she was noted to have an INR greater than 7 which negated immediate intervention.  Once the patient's INR has been corrected we immediately moved toward treatment of her pseudoaneurysm with thrombin injection.  The risks and benefits were reviewed all questions answered patient agrees to proceed.   DESCRIPTION: After full informed written consent was obtained from the patient, the patient was brought back to the operating room and placed supine upon the operating table.  Prior to induction, the patient received IV antibiotics.   After obtaining adequate anesthesia, the patient was then prepped and draped in the standard fashion for a thrombin injection left groin pseudoaneurysm.  The ultrasound was used to identify the pseudoaneurysm.  Micro needle was inserted directly into the pseudoaneurysm sac return of blood was noted.  A total of 1 cc of thrombin was noted.  Immediate flash with subsequent cessation of flow was then identified.  Doppler was then used to interrogate the left foot and a strong triphasic dorsalis pedis signal is identified.  This is consistent with baseline.   The patient tolerated this procedure well.   COMPLICATIONS: None  CONDITION: [name] [facility]  Office: [phone]   01/20/2018, 9:06 AM

## 2018-01-20 NOTE — Progress Notes (Signed)
Informed MD of malodorous urine today. Asked if UA is possible. Awaiting response

## 2018-01-20 NOTE — Progress Notes (Signed)
Paged MD for post transfusion H/H. Awaiting response

## 2018-01-20 NOTE — Consult Note (Signed)
   [facility] CM Inpatient Consult   01/20/2018  Alison Russo 02-28-1926 [id number]   Chart review revealed patient eligible for [place] Management services and post hospital discharge follow up related to a diagnosis of Afib, CAD, NSTEMI and 3 admits in the last 6 months.  Spoke with inpatient Social worker who confirmed patient would be a good candidate for [facility] services.Patient was evaluated for community based chronic disease management services with [facility] care Management Program as a benefit of patient's [employer] Medicare. Met with the patient at the bedside to explain [place] [facility] services. Patient endorses her primary care provider to be Dr. Chancy Milroy. Patient states she was previously living alone and is hoping to go back home. Verbal consent recieved. Patient gave [phone] as the best number to reach her. Patient will receive post hospital discharge calls and be evaluated for monthly home visits. If patient discharges to [facility] she will receive discharge planning visits from [place]. [facility] Care Management services do not interfere with or replace any services arranged by the inpatient care management team. RNCM left contact information and [facility] literature at the bedside. Made inpatient social worker aware  [facility] will be following for care management. For additional questions please contact:   Colt Martelle RN, [place] [facility] Liaison  [phone]) Business Mobile [phone]) Toll free office

## 2018-01-20 NOTE — Progress Notes (Signed)
Dr. Gilda Crease at bedside [phone], taking Pt back to IR LAB STAT, unable to start Pre Procedure checklist or obtain VS. Pt taken to IR with IR team.

## 2018-01-21 DIAGNOSIS — M7981 Nontraumatic hematoma of soft tissue: Secondary | ICD-10-CM

## 2018-01-21 LAB — CBC
HEMATOCRIT: 23.2 % — AB (ref 35.0–47.0)
Hemoglobin: 7.7 g/dL — ABNORMAL LOW (ref 12.0–16.0)
MCH: 28.1 pg (ref 26.0–34.0)
MCHC: 33.2 g/dL (ref 32.0–36.0)
MCV: 84.9 fL (ref 80.0–100.0)
Platelets: 193 10*3/uL (ref 150–440)
RBC: 2.73 MIL/uL — AB (ref 3.80–5.20)
RDW: 17.5 % — ABNORMAL HIGH (ref 11.5–14.5)
WBC: 10.5 10*3/uL (ref 3.6–11.0)

## 2018-01-21 LAB — GLUCOSE, CAPILLARY: Glucose-Capillary: 95 mg/dL (ref 65–99)

## 2018-01-21 MED ORDER — ASPIRIN 81 MG PO CHEW
81.0000 mg | CHEWABLE_TABLET | Freq: Every day | ORAL | Status: DC
  Administered 2018-01-22 – 2018-01-23 (×2): 81 mg via ORAL
  Filled 2018-01-21 (×2): qty 1

## 2018-01-21 MED ORDER — CLOPIDOGREL BISULFATE 75 MG PO TABS
75.0000 mg | ORAL_TABLET | Freq: Every day | ORAL | Status: DC
Start: 2018-01-22 — End: 2018-01-23
  Administered 2018-01-22 – 2018-01-23 (×2): 75 mg via ORAL
  Filled 2018-01-21 (×2): qty 1

## 2018-01-21 NOTE — Evaluation (Signed)
Physical Therapy Evaluation Patient Details Name: Alison Russo MRN: [id number] DOB: [birth year] Today's Date: 01/21/2018   History of Present Illness  Patient is a pleasant 84 y/o female that presents with bruising over L thigh, found to have a pseudoaneurysm after cardiac cath and stent placement recently. She is s/p thrombin injection yesterday.   Clinical Impression  Patient has had multiple procedures performed in the past few weeks which have significantly impaired her mobility and ability to transfer. She required mod-max A of (1-2) for bed mobility and sit to stand transfer. Once in standing, she does surprisingly well with ambulation and is able to ambulate ~ 100' with frequent cuing for improving stride length and not dragging her feet. No complications noted throughout session, and PT opted to end mobility bout before patient fatigued to minimize risk of complication at incision. Given the above, she requires significant assistance for transfers and mobility and would benefit from SNF placement at discharge to allow for improved functional mobility prior to returning home.     Follow Up Recommendations SNF    Equipment Recommendations  Rolling walker with 5" wheels    Recommendations for Other Services       Precautions / Restrictions Precautions Precautions: Fall Restrictions Weight Bearing Restrictions: No      Mobility  Bed Mobility Overal bed mobility: Needs Assistance Bed Mobility: Supine to Sit     Supine to sit: Mod assist;Max assist     General bed mobility comments: Patient limited with transferring LEs secondary to pain in LLE with movement.   Transfers Overall transfer level: Needs assistance Equipment used: Rolling walker (2 wheeled) Transfers: Sit to/from Stand Sit to Stand: Mod assist;Max assist;+2 physical assistance         General transfer comment: Cuing for nose over toes and anterior trunk lean, requiring significant assistance from low bed  position.   Ambulation/Gait Ambulation/Gait assistance: Min assist Ambulation Distance (Feet): 100 Feet Assistive device: Rolling walker (2 wheeled) Gait Pattern/deviations: WFL(Within Functional Limits);Trunk flexed   Gait velocity interpretation: <1.31 ft/sec, indicative of household ambulator General Gait Details: Patient required multiple verbal and tactile cues to keep COM within base of RW, anterior trunk lean noted throughout ambulation bout. Cuing to increase stride length as she began to shuffle at times, no buckling evident.   Stairs            Wheelchair Mobility    Modified Rankin (Stroke Patients Only)       Balance Overall balance assessment: Needs assistance Sitting-balance support: Bilateral upper extremity supported Sitting balance-Leahy Scale: Fair     Standing balance support: Bilateral upper extremity supported Standing balance-Leahy Scale: Fair                               Pertinent Vitals/Pain Pain Assessment: 0-10 Pain Score: 4  Pain Descriptors / Indicators: Aching;Operative site guarding Pain Intervention(s): Limited activity within patient's tolerance;Monitored during session;Repositioned    Home Living Family/patient expects to be discharged to:: Private residence Living Arrangements: Alone Available Help at Discharge: Family;Available PRN/intermittently Type of Home: House         Home Equipment: Gilmer Mor - single point      Prior Function Level of Independence: Independent with assistive device(s)         Comments: Prior to this admission patient had been limited with mobility recently after cardiac cath, using a cane for limited household mobility.      Hand  Dominance        Extremity/Trunk Assessment   Upper Extremity Assessment Upper Extremity Assessment: Overall WFL for tasks assessed    Lower Extremity Assessment Lower Extremity Assessment: LLE deficits/detail LLE Deficits / Details: Pain limited hip  flexor and quad strength, no buckling evident in ambulation       Communication   Communication: No difficulties  Cognition Arousal/Alertness: Awake/alert Behavior During Therapy: WFL for tasks assessed/performed Overall Cognitive Status: Within Functional Limits for tasks assessed                                        General Comments General comments (skin integrity, edema, etc.): No drainage noted from incision site, bruising noted over L thigh.     Exercises     Assessment/Plan    PT Assessment Patient needs continued PT services  PT Problem List Decreased strength;Decreased mobility;Decreased safety awareness;Decreased activity tolerance;Cardiopulmonary status limiting activity;Decreased balance;Decreased knowledge of use of DME       PT Treatment Interventions DME instruction;Therapeutic activities;Gait training;Therapeutic exercise;Stair training;Balance training;Neuromuscular re-education    PT Goals (Current goals can be found in the Care Plan section)  Acute Rehab PT Goals Patient Stated Goal: To return home safely  PT Goal Formulation: With patient Time For Goal Achievement: 02/04/18 Potential to Achieve Goals: Good    Frequency Min 2X/week   Barriers to discharge        Co-evaluation               AM-PAC PT "6 Clicks" Daily Activity  Outcome Measure Difficulty turning over in bed (including adjusting bedclothes, sheets and blankets)?: A Lot Difficulty moving from lying on back to sitting on the side of the bed? : A Lot Difficulty sitting down on and standing up from a chair with arms (e.g., wheelchair, bedside commode, etc,.)?: A Lot Help needed moving to and from a bed to chair (including a wheelchair)?: A Lot Help needed walking in hospital room?: A Lot Help needed climbing 3-5 steps with a railing? : Total 6 Click Score: 11    End of Session Equipment Utilized During Treatment: Gait belt Activity Tolerance: Patient tolerated  treatment well Patient left: in chair;with call [employer]/phone within reach;with chair alarm set;with family/visitor present Nurse Communication: Mobility status PT Visit Diagnosis: Unsteadiness on feet (R26.81);Muscle weakness (generalized) (M62.81)    Time: 9147-8295 PT Time Calculation (min) (ACUTE ONLY): 26 min   Charges:   PT Evaluation $PT Eval Moderate Complexity: 1 Mod     PT G Codes:        Alva Garnet PT, DPT, CSCS    01/21/2018, 2:09 PM

## 2018-01-21 NOTE — Evaluation (Signed)
Occupational Therapy Evaluation Patient Details Name: Alison Russo MRN: [id number] DOB: 12/21/1925 Today's Date: 01/21/2018    History of Present Illness Patient is a pleasant 84 y/o female that presents with bruising over L thigh, found to have a pseudoaneurysm after cardiac cath and stent placement recently. She is s/p thrombin injection yesterday.    Clinical Impression   Patient has had multiple procedures performed in the past few weeks which have significantly impaired her mobility and ability to perform LB ADL's and functional mobility. She required mod-max A of (1-2) for bed mobility and sit to stand transfer.  Pain increase with supiet<> sit , and sitting EOB and sitting /<> standing  - fatigue sitting for long time - Once in standing, she does surprisingly well with  Transfers  No complications noted throughout session, and  Given the above, she requires significant assistance for transfers, ADL's and functional mobility and would benefit from SNF placement at discharge  prior to returning home.        Follow Up Recommendations     STR   Equipment Recommendations    AE, BSC, RW   Recommendations for Other Services       Precautions / Restrictions Precautions Precautions: Fall Restrictions Weight Bearing Restrictions: No      Mobility Bed Mobility Overal bed mobility: Needs Assistance Bed Mobility: Supine to Sit     Supine to sit: Mod assist;Max assist     General bed mobility comments: Patient limited with transferring LEs secondary to pain in LLE with movement.   Transfers Overall transfer level: Needs assistance Equipment used: Rolling walker (2 wheeled) Transfers: Sit to/from Stand Sit to Stand: Mod assist;Max assist;+2 physical assistance         General transfer comment: Cuing for nose over toes and anterior trunk lean, requiring significant assistance from low bed position.     Balance Overall balance assessment: Needs  assistance Sitting-balance support: Bilateral upper extremity supported Sitting balance-Leahy Scale: Fair     Standing balance support: Bilateral upper extremity supported Standing balance-Leahy Scale: Fair                             ADL either performed or assessed with clinical judgement   ADL                                         General ADL Comments: LB dressing and bathing  max A , UB dressing and bathing  mod A , Grooming independent S with setup , Toiletting Mod A ,      Vision Patient Visual Report: No change from baseline       Perception     Praxis      Pertinent Vitals/Pain Pain Assessment: 0-10 Pain Score: 4  Pain Descriptors / Indicators: Aching;Operative site guarding Pain Intervention(s): Limited activity within patient's tolerance;Monitored during session;Repositioned     Hand Dominance     Extremity/Trunk Assessment Upper Extremity Assessment Upper Extremity Assessment: Overall WFL for tasks assessed   Lower Extremity Assessment Lower Extremity Assessment: LLE deficits/detail LLE Deficits / Details: Pain limited hip flexor and quad strength, no buckling evident in ambulation       Communication Communication Communication: No difficulties   Cognition Arousal/Alertness: Awake/alert Behavior During Therapy: WFL for tasks assessed/performed Overall Cognitive Status: Within Functional Limits for tasks assessed  General Comments  No drainage noted from incision site, bruising noted over L thigh.     Exercises Other Exercises Other Exercises: Bilateral UE WFL - strength WFL    Shoulder Instructions      Home Living Family/patient expects to be discharged to:: Private residence Living Arrangements: Alone Available Help at Discharge: Family;Available PRN/intermittently Type of Home: House                       Home Equipment: Gilmer Mor - single point           Prior Functioning/Environment Level of Independence: Independent with assistive device(s)        Comments: Prior to this admission patient had been limited with mobility recently after cardiac cath, using a cane for limited household mobility.         OT Problem List: Increased edema;Decreased knowledge of use of DME or AE;Decreased activity tolerance;Impaired UE functional use;Pain;Impaired balance (sitting and/or standing)      OT Treatment/Interventions: Self-care/ADL training;Therapeutic exercise;Patient/family education;DME and/or AE instruction    OT Goals(Current goals can be found in the care plan section) Acute Rehab OT Goals Patient Stated Goal: To return home safely   OT Frequency: Min 3X/week   Barriers to D/C:            Co-evaluation PT/OT/SLP Co-Evaluation/Treatment: Yes Reason for Co-Treatment: For patient/therapist safety;To address functional/ADL transfers   OT goals addressed during session: ADL's and self-care      AM-PAC PT "6 Clicks" Daily Activity     Outcome Measure    GOALS:  1: LB dressing min A with using AE 1 wk  2: Toilet tranfers to be S with no LOB  1 wks 3: Sitting at EOB with no LOB for 10 min with no complications to participate in  LB dressing  1 wks                End of Session Equipment Utilized During Treatment: Gait belt  Activity Tolerance: Patient tolerated treatment well(pain supine to sit and sitting EOB ) Patient left: in chair;with chair alarm set;with family/visitor present  OT Visit Diagnosis: Muscle weakness (generalized) (M62.81)                Time: 1610-9604 OT Time Calculation (min): 32 min Charges:  OT General Charges $OT Visit: 1 Visit OT Evaluation $OT Eval Low Complexity: 1 Low G-Codes:       Sanaii Caporaso OTR/L,CLT 01/21/2018, 3:20 PM

## 2018-01-21 NOTE — Progress Notes (Signed)
[employer] [facility] [facility] [place] at [facility]   PATIENT NAME: Alison Russo    MR#:  [id number]  DATE OF BIRTH:  1925-10-16  SUBJECTIVE:  CHIEF COMPLAINT:   Chief Complaint  Patient presents with  . Post-op Problem  . Hypotension   -Patient with pseudoaneurysm and left thigh hematoma after cardiac catheterization.  Status post thrombin injection yesterday -Complains of significant pain and swelling and ecchymosis of the left thigh area.  REVIEW OF SYSTEMS:  Review of Systems  Constitutional: Negative for chills, fever and malaise/fatigue.  HENT: Negative for congestion, ear discharge, hearing loss and nosebleeds.   Eyes: Negative for blurred vision and double vision.  Respiratory: Positive for shortness of breath. Negative for cough and wheezing.   Cardiovascular: Positive for leg swelling. Negative for chest pain and palpitations.  Gastrointestinal: Negative for abdominal pain, constipation, diarrhea, nausea and vomiting.  Genitourinary: Negative for dysuria.  Musculoskeletal: Positive for myalgias.  Neurological: Negative for dizziness, focal weakness, seizures, weakness and headaches.  Psychiatric/Behavioral: Negative for depression.    DRUG ALLERGIES:  No Known Allergies  VITALS:  Blood pressure 112/63, pulse 91, temperature 98.8 F (37.1 C), temperature source Oral, resp. rate 18, height  (1.651 m), weight 98.4 kg (217 lb), SpO2 93 %.  PHYSICAL EXAMINATION:  Physical Exam  GENERAL:  84 y.o.-year-old elderly patient lying in the bed with no acute distress.  EYES: Pupils equal, round, reactive to light and accommodation. No scleral icterus. Extraocular muscles intact.  HEENT: Head atraumatic, normocephalic. Oropharynx and nasopharynx clear.  NECK:  Supple, no jugular venous distention. No thyroid enlargement, no tenderness.  LUNGS: Normal breath sounds bilaterally, no wheezing but some upper airway wheeze noted. No rales,rhonchi or crepitation. No use  of accessory muscles of respiration.  CARDIOVASCULAR: S1, S2 normal. No  rubs, or gallops. 3/6 systolic murmur present. ABDOMEN: Soft, nontender, nondistended. Bowel sounds present. No organomegaly or mass.  EXTREMITIES: No pedal edema, cyanosis, or clubbing. Significant bruising and ecchymosis of left thigh, groin region extending onto the [facility] pubis,and extending proximally and laterally onto lower abdomen- tense and tender on palpation. No active bleeding from left groin site NEUROLOGIC: Cranial nerves II through XII are intact. Muscle strength 5/5 in all extremities. Sensation intact. Gait not checked. Global weakness PSYCHIATRIC: The patient is alert and oriented x 3.  SKIN: No obvious rash, lesion, or ulcer.    LABORATORY PANEL:   CBC Recent Labs  Lab 01/20/18 0441  WBC 10.2  HGB 7.6*  HCT 22.9*  PLT 171   ------------------------------------------------------------------------------------------------------------------  Chemistries  Recent Labs  Lab 01/19/18 0734 01/20/18 0441  NA 138  137 139  K 4.0  3.9 3.5  CL 110  110 109  CO2 GLUCOSE 99  102* 117*  BUN 20  22* 14  CREATININE 0.63  0.76 0.64  CALCIUM 7.9*  8.0* 8.1*  MG  --  1.8  AST 20  --   ALT 16  --   ALKPHOS 102  --   BILITOT 1.2  --    ------------------------------------------------------------------------------------------------------------------  Cardiac Enzymes Recent Labs  Lab 01/17/18 2002  TROPONINI <0.03   ------------------------------------------------------------------------------------------------------------------  RADIOLOGY:  No results found.  EKG:   Orders placed or performed during the hospital encounter of 01/17/18  . EKG 12-Lead  . EKG 12-Lead  . ED EKG  . ED EKG    ASSESSMENT AND PLAN:   Alison Russo 84 y.o.femalewith a known history of PE, on Coumadin, hypertension, coronary artery  disease status post 2 stent placement approximately 3  weeks ago. Patient was brought to emergency room for acute onset of nausea,diaphoresis and generalized weakness,going on for the past 24 hours. Her blood pressure was low, 80/60, when paramedics arrived to her house. She was noted with a large area of bruising and hematoma at left thigh and left groin area,status post left femoral artery catheterization from April 24,2019.  1.Left thigh hematoma,status post femoral artery catheterization 4 weeks ago - due to Large Pseudoaneurysm -INR was supratherapeutic on admission, received vitamin K with reversal. -Appreciate vascular surgery consult.  Patient is status post thrombin injection into pseudoaneurysm on 01/20/2018 -Monitor hemoglobin and INR -Eisbach as needed -Physical therapy consult  2.Anemia,- acute secondary to acute blood loss. -came in with hemoglobin of 6.5- status post total 2 units packed RBC transfusion this admission and hemoglobin at 7.6.  Recheck hemoglobin- continue to monitor H&H closely.  3.Hypotension,secondary to blood loss,responded to fluids. -Fluids discontinued now  4.History of PE. -We will have to discontinue blood thinners for now,due to left thigh hematoma and severe anemia. -We will reevaluate the need for anticoagulant in the future.  5.Coronary artery disease,post recent stent placement. Patient had LAD drug-eluting stents placed about 4 to 5 weeks ago. -If hemoglobin is stable-we will restart aspirin low-dose and Plavix.  Updated patient and daughter at bedside       All the records are reviewed and case discussed with Care Management/Social Workerr. Management plans discussed with the patient, family and they are in agreement.  Russo STATUS: Full Russo  TOTAL TIME TAKING CARE OF THIS PATIENT: 38 minutes.   POSSIBLE D/C IN 1-2 DAYS, DEPENDING ON CLINICAL CONDITION.   Alison Russo M.D on 01/21/2018 at 1:19 PM  Between 7am to 6pm - Pager -  [phone]  After 6pm go to [email or web address] - password [email or web address]  [facility] [place] [facility]  Office  [phone]  CC: Primary care physician; Alison Code, MD

## 2018-01-21 NOTE — Progress Notes (Signed)
[facility]  Daily Progress Note   Subjective: 1 Day Post-Op: Thrombin injection left femoral pseudoaneurysm  Patient states that although she is still experiencing pain to the left thigh it has improved slightly since her procedure yesterday.  Patient was seen by both physical and occupational therapy today.  Objective: Vitals:   01/21/18 0046 01/21/18 0548 01/21/18 0721 01/21/18 1013  BP: 118/63  130/61 112/63  Pulse: 74  79 91  Resp: 18  18   Temp: 99.2 F (37.3 C)  98.8 F (37.1 C)   TempSrc: Oral  Oral   SpO2: 94%  93%   Weight:  217 lb (98.4 kg)    Height:        Intake/Output Summary (Last 24 hours) at 01/21/2018 1529 Last data filed at 01/21/2018 1027 Gross per 24 hour  Intake 780 ml  Output -  Net 780 ml   Physical Exam: A&Ox3, NAD CV: RRR Pulmonary: CTA Bilaterally Abdomen: Soft, Nontender, Nondistended Vascular:  Left lower extremity: Procedure dressing intact.  There is no drainage or swelling noted.  There is a large area of ecchymosis from approximately mid abdomen tracking laterally and distally to the mid thigh.  This area continues to be tender to palpation.  Right lower extremity: Unna wrap is on.  Minimal edema to the extremity is noted.   Laboratory: CBC    Component Value Date/Time   WBC 10.5 01/21/2018 1354   HGB 7.7 (L) 01/21/2018 1354   HGB 11.3 09/28/2017 1259   HCT 23.2 (L) 01/21/2018 1354   HCT 36.4 09/28/2017 1259   PLT 193 01/21/2018 1354   PLT 214 09/28/2017 1259   BMET    Component Value Date/Time   NA 139 01/20/2018 0441   NA 143 09/28/2017 1259   K 3.5 01/20/2018 0441   CL 109 01/20/2018 0441   CO2 26 01/20/2018 0441   GLUCOSE 117 (H) 01/20/2018 0441   BUN 14 01/20/2018 0441   BUN 16 09/28/2017 1259   CREATININE 0.64 01/20/2018 0441   CALCIUM 8.1 (L) 01/20/2018 0441   GFRNONAA >60 01/20/2018 0441   GFRAA >60 01/20/2018 0441   Assessment/Planning: The patient is a 84 year old female who is status  post a thrombin injection into a left femoral pseudoaneurysm postop day 1 - Stable 1) physical therapy is recommending discharge to SNF.  Agree with this.  Patient has become deconditioned and it would not be safe for discharge home.  Will consult social work for SNF placement. 2) there is no drainage or swelling noted to the left groin injection site.  The hematoma that the patient has to her left lower extremity may take weeks to resolve.  I have had long discussions with the patient and her daughter to expect this.  Cleda Daub PA-C 01/21/2018 3:29 PM

## 2018-01-22 DIAGNOSIS — M7981 Nontraumatic hematoma of soft tissue: Secondary | ICD-10-CM

## 2018-01-22 LAB — CBC
HCT: 22 % — ABNORMAL LOW (ref 35.0–47.0)
Hemoglobin: 7.2 g/dL — ABNORMAL LOW (ref 12.0–16.0)
MCH: 27.9 pg (ref 26.0–34.0)
MCHC: 32.9 g/dL (ref 32.0–36.0)
MCV: 84.8 fL (ref 80.0–100.0)
PLATELETS: 202 10*3/uL (ref 150–440)
RBC: 2.6 MIL/uL — ABNORMAL LOW (ref 3.80–5.20)
RDW: 17.8 % — AB (ref 11.5–14.5)
WBC: 9.5 10*3/uL (ref 3.6–11.0)

## 2018-01-22 LAB — GLUCOSE, CAPILLARY: Glucose-Capillary: 96 mg/dL (ref 65–99)

## 2018-01-22 LAB — PREPARE RBC (CROSSMATCH)

## 2018-01-22 LAB — HEMOGLOBIN AND HEMATOCRIT, BLOOD
HEMATOCRIT: 25.7 % — AB (ref 35.0–47.0)
Hemoglobin: 8.6 g/dL — ABNORMAL LOW (ref 12.0–16.0)

## 2018-01-22 MED ORDER — HYDROCODONE-ACETAMINOPHEN 5-325 MG PO TABS: 1.0000 | ORAL_TABLET | ORAL | Status: DC | PRN

## 2018-01-22 MED ORDER — SODIUM CHLORIDE 0.9 % IV SOLN
Freq: Once | INTRAVENOUS | Status: AC
  Administered 2018-01-22: 12:00:00 via INTRAVENOUS

## 2018-01-22 MED ORDER — TRAMADOL HCL 50 MG PO TABS
50.0000 mg | ORAL_TABLET | Freq: Four times a day (QID) | ORAL | Status: DC | PRN
  Administered 2018-01-22: 50 mg via ORAL
  Filled 2018-01-22: qty 1

## 2018-01-22 NOTE — Clinical Social Work Note (Addendum)
CSW is aware of PT/OT recommendations for SNF. The patient continues to report that she would prefer to return home. The CSW will continue to follow should the patient change her preference. Currently, the plan is for discharge home with home health and family support.  UPDATE: The patient's daughter has met with the medical team and shared that the patient does not have the support at home that the patient reported. The patient's family is in agreement with pursuing SNF for rehab. The CSW has begun the referral process with verbal consent from the patient's daughter. The CSW is following.  Santiago Bumpers, MSW, Latanya Presser [phone]

## 2018-01-22 NOTE — Progress Notes (Signed)
[employer] [facility] - [place] at [facility]   PATIENT NAME: Alison Russo    MR#:  [id number]  DATE OF BIRTH:  09/03/1926  SUBJECTIVE:  CHIEF COMPLAINT:   Chief Complaint  Patient presents with  . Post-op Problem  . Hypotension   -walked to the bathroom better today with a walker.  Hemoglobin dropped again to 7.2.  No active bleeding at this time -Wants to go home.  REVIEW OF SYSTEMS:  Review of Systems  Constitutional: Negative for chills, fever and malaise/fatigue.  HENT: Negative for congestion, ear discharge, hearing loss and nosebleeds.   Eyes: Negative for blurred vision and double vision.  Respiratory: Negative for cough, shortness of breath and wheezing.   Cardiovascular: Positive for leg swelling. Negative for chest pain and palpitations.  Gastrointestinal: Negative for abdominal pain, constipation, diarrhea, nausea and vomiting.  Genitourinary: Negative for dysuria.  Musculoskeletal: Positive for myalgias.  Neurological: Negative for dizziness, focal weakness, seizures, weakness and headaches.  Psychiatric/Behavioral: Negative for depression.    DRUG ALLERGIES:  No Known Allergies  VITALS:  Blood pressure 116/62, pulse 75, temperature 97.7 F (36.5 C), temperature source Oral, resp. rate 15, height  (1.651 m), weight 92.5 kg (204 lb), SpO2 96 %.  PHYSICAL EXAMINATION:  Physical Exam  GENERAL:  84 y.o.-year-old elderly patient sitting in the bed with no acute distress.  EYES: Pupils equal, round, reactive to light and accommodation. No scleral icterus. Extraocular muscles intact.  HEENT: Head atraumatic, normocephalic. Oropharynx and nasopharynx clear.  NECK:  Supple, no jugular venous distention. No thyroid enlargement, no tenderness.  LUNGS: Normal breath sounds bilaterally, no wheezing but some upper airway wheeze noted. No rales,rhonchi or crepitation. No use of accessory muscles of respiration.  CARDIOVASCULAR: S1, S2 normal. No  rubs, or  gallops. 3/6 systolic murmur present. ABDOMEN: Soft, nontender, nondistended. Bowel sounds present. No organomegaly or mass.  EXTREMITIES: No pedal edema, cyanosis, or clubbing. Significant bruising and ecchymosis of left thigh, groin region extending onto the [facility] pubis,and extending proximally and laterally onto lower abdomen- tense and tender on palpation. No active bleeding from left groin site NEUROLOGIC: Cranial nerves II through XII are intact. Muscle strength 5/5 in all extremities. Sensation intact. Gait not checked. Global weakness PSYCHIATRIC: The patient is alert and oriented x 3.  SKIN: No obvious rash, lesion, or ulcer.    LABORATORY PANEL:   CBC Recent Labs  Lab 01/22/18 0359  WBC 9.5  HGB 7.2*  HCT 22.0*  PLT 202   ------------------------------------------------------------------------------------------------------------------  Chemistries  Recent Labs  Lab 01/19/18 0734 01/20/18 0441  NA 138  137 139  K 4.0  3.9 3.5  CL 110  110 109  CO2 GLUCOSE 99  102* 117*  BUN 20  22* 14  CREATININE 0.63  0.76 0.64  CALCIUM 7.9*  8.0* 8.1*  MG  --  1.8  AST 20  --   ALT 16  --   ALKPHOS 102  --   BILITOT 1.2  --    ------------------------------------------------------------------------------------------------------------------  Cardiac Enzymes Recent Labs  Lab 01/17/18 2002  TROPONINI <0.03   ------------------------------------------------------------------------------------------------------------------  RADIOLOGY:  No results found.  EKG:   Orders placed or performed during the hospital encounter of 01/17/18  . EKG 12-Lead  . EKG 12-Lead  . ED EKG  . ED EKG    ASSESSMENT AND PLAN:   Alison Russo an84 y.o.femalewith a known history of PE, on Coumadin, hypertension, coronary artery disease status post 2 stent  placement approximately 3 weeks ago. Patient was brought to emergency room for acute onset of  nausea,diaphoresis and generalized weakness,going on for the past 24 hours. Her blood pressure was low, 80/60, when paramedics arrived to her house. She was noted with a large area of bruising and hematoma at left thigh and left groin area,status post left femoral artery catheterization from April 24,2019.  1.Left thigh hematoma,status post femoral artery catheterization 4 weeks ago - due to Large Pseudoaneurysm -INR was supratherapeutic on admission, received vitamin K with reversal. -Appreciate vascular surgery consult.  Patient is status post thrombin injection into pseudoaneurysm on 01/20/2018 -Physical therapy consulted - ambulating better today  2.Anemia,- acute secondary to acute blood loss. -came in with hemoglobin of 6.5- status post total 2 units packed RBC transfusion this admission and hemoglobin at 7.2 this morning - will transfuse 1 unit due to recent MI and stents to keep Hb close to 8 - Recheck hemoglobin- continue to monitor H&H closely.  3.Hypotension,secondary to blood loss,responded to fluids. -Fluids discontinued now  4.History of chronic PE.several years ago - no recent CT chest suggesting acute PE and dopplers no DVT -We will have to discontinue blood thinners for now,due to left thigh hematoma and severe anemia. -We will reevaluate the need for anticoagulant in the future.  5.Coronary artery disease,post recent stent placement by [facility]. Patient had LAD drug-eluting stents placed about 4 to 5 weeks ago. -restart aspirin low-dose and Plavix today as they were held on admission - 5 days for now  Updated patient  at bedside PT recommended rehab- spent more than 40 min, discussing with patient, as she is doing better with walker if her pain is controlled.  If she can have some support at home, she will be better served at home with home health.  We will update her daughter this afternoon     All the records are reviewed and case  discussed with Care Management/Social Workerr. Management plans discussed with the patient, family and they are in agreement.  CODE STATUS: Full Code  TOTAL TIME TAKING CARE OF THIS PATIENT: 38 minutes.   POSSIBLE D/C IN 1-2 DAYS, DEPENDING ON CLINICAL CONDITION.   Enid Baas M.D on 01/22/2018 at 9:21 AM  Between 7am to 6pm - Pager - [phone]  After 6pm go to [email or web address] - password [email or web address]  [facility] [place] [facility]  Office  [phone]  CC: Primary care physician; Lyndon Code, MD

## 2018-01-22 NOTE — Progress Notes (Signed)
[facility]  Daily Progress Note   Subjective: 2 Days Post-Op: Thrombin injection left femoral pseudoaneurysm  Patient continues to have soreness to the left thigh.  Had a long discussion with the patient and her daughter who was present in the room about rehab.  The patient understands that our goal in sending her to rehab is to increase her stamina and strength so that she can be safely discharged home.  The patient expresses her understanding and is in agreement with discharge to rehab.  Objective: Vitals:   01/22/18 0935 01/22/18 1156 01/22/18 1221 01/22/18 1434  BP: 118/69 111/68 125/72 122/66  Pulse: 79 74 70 80  Resp:  Temp:  98.2 F (36.8 C) 98.1 F (36.7 C) 97.8 F (36.6 C)  TempSrc:  Oral Oral Oral  SpO2:  98% 100% 100%  Weight:      Height:        Intake/Output Summary (Last 24 hours) at 01/22/2018 1644 Last data filed at 01/22/2018 1434 Gross per 24 hour  Intake 536 ml  Output 225 ml  Net 311 ml   Physical Exam: A&Ox3, NAD CV: RRR Pulmonary: CTA Bilaterally Abdomen: Soft, Nontender, Nondistended Left lower extremity: Procedure dressing removed.  Very small injection site noted to be clean dry and intact.  There is no swelling or drainage to the area.  Ecchymosis is slowly improving however it is still tracking down the left leg up towards the abdomen into the left flank. Right lower extremity: Unna wrap was changed yesterday.  There is minimal swelling.   Laboratory: CBC    Component Value Date/Time   WBC 9.5 01/22/2018 0359   HGB 8.6 (L) 01/22/2018 1555   HGB 11.3 09/28/2017 1259   HCT 25.7 (L) 01/22/2018 1555   HCT 36.4 09/28/2017 1259   PLT 202 01/22/2018 0359   PLT 214 09/28/2017 1259   BMET    Component Value Date/Time   NA 139 01/20/2018 0441   NA 143 09/28/2017 1259   K 3.5 01/20/2018 0441   CL 109 01/20/2018 0441   CO2 26 01/20/2018 0441   GLUCOSE 117 (H) 01/20/2018 0441   BUN 14 01/20/2018 0441   BUN 16  09/28/2017 1259   CREATININE 0.64 01/20/2018 0441   CALCIUM 8.1 (L) 01/20/2018 0441   GFRNONAA >60 01/20/2018 0441   GFRAA >60 01/20/2018 0441   Assessment/Planning: The patient is a 84 year old female who is status post a thrombin injection into a left femoral pseudoaneurysm postop day 2 - stable 1) physical therapy and vascular surgery is recommending discharge to SNF.  She notes and agree with this.  Social work has been consulted. 2) from a vascular surgery standpoint the patient can be discharged when she is medically stable  Cleda Daub PA-C 01/22/2018 4:44 PM

## 2018-01-23 ENCOUNTER — Encounter: Payer: Self-pay | Admitting: Vascular Surgery

## 2018-01-23 DIAGNOSIS — I724 Aneurysm of artery of lower extremity: Secondary | ICD-10-CM | POA: Diagnosis not present

## 2018-01-23 DIAGNOSIS — Z955 Presence of coronary angioplasty implant and graft: Secondary | ICD-10-CM | POA: Diagnosis not present

## 2018-01-23 DIAGNOSIS — Z7401 Bed confinement status: Secondary | ICD-10-CM | POA: Diagnosis not present

## 2018-01-23 DIAGNOSIS — Z741 Need for assistance with personal care: Secondary | ICD-10-CM | POA: Diagnosis not present

## 2018-01-23 DIAGNOSIS — M6281 Muscle weakness (generalized): Secondary | ICD-10-CM | POA: Diagnosis not present

## 2018-01-23 DIAGNOSIS — D649 Anemia, unspecified: Secondary | ICD-10-CM | POA: Diagnosis not present

## 2018-01-23 DIAGNOSIS — Z86711 Personal history of pulmonary embolism: Secondary | ICD-10-CM | POA: Diagnosis not present

## 2018-01-23 DIAGNOSIS — I10 Essential (primary) hypertension: Secondary | ICD-10-CM | POA: Diagnosis not present

## 2018-01-23 DIAGNOSIS — I2510 Atherosclerotic heart disease of native coronary artery without angina pectoris: Secondary | ICD-10-CM | POA: Diagnosis not present

## 2018-01-23 DIAGNOSIS — S7012XA Contusion of left thigh, initial encounter: Secondary | ICD-10-CM | POA: Diagnosis not present

## 2018-01-23 DIAGNOSIS — R278 Other lack of coordination: Secondary | ICD-10-CM | POA: Diagnosis not present

## 2018-01-23 DIAGNOSIS — R791 Abnormal coagulation profile: Secondary | ICD-10-CM | POA: Diagnosis not present

## 2018-01-23 DIAGNOSIS — R2681 Unsteadiness on feet: Secondary | ICD-10-CM | POA: Diagnosis not present

## 2018-01-23 DIAGNOSIS — R2242 Localized swelling, mass and lump, left lower limb: Secondary | ICD-10-CM | POA: Diagnosis not present

## 2018-01-23 DIAGNOSIS — F39 Unspecified mood [affective] disorder: Secondary | ICD-10-CM | POA: Diagnosis not present

## 2018-01-23 DIAGNOSIS — I729 Aneurysm of unspecified site: Secondary | ICD-10-CM | POA: Diagnosis not present

## 2018-01-23 DIAGNOSIS — M79605 Pain in left leg: Secondary | ICD-10-CM | POA: Diagnosis not present

## 2018-01-23 LAB — CBC
HCT: 24.8 % — ABNORMAL LOW (ref 35.0–47.0)
Hemoglobin: 8.4 g/dL — ABNORMAL LOW (ref 12.0–16.0)
MCH: 28.7 pg (ref 26.0–34.0)
MCHC: 34 g/dL (ref 32.0–36.0)
MCV: 84.5 fL (ref 80.0–100.0)
Platelets: 212 10*3/uL (ref 150–440)
RBC: 2.94 MIL/uL — ABNORMAL LOW (ref 3.80–5.20)
RDW: 17.5 % — AB (ref 11.5–14.5)
WBC: 8.5 10*3/uL (ref 3.6–11.0)

## 2018-01-23 LAB — URINALYSIS, COMPLETE (UACMP) WITH MICROSCOPIC
BACTERIA UA: NONE SEEN
Bilirubin Urine: NEGATIVE
GLUCOSE, UA: NEGATIVE mg/dL
Hgb urine dipstick: NEGATIVE
Ketones, ur: NEGATIVE mg/dL
Nitrite: NEGATIVE
PROTEIN: NEGATIVE mg/dL
Specific Gravity, Urine: 1.021 (ref 1.005–1.030)
pH: 6 (ref 5.0–8.0)

## 2018-01-23 LAB — TYPE AND SCREEN
ABO/RH(D): A POS
Antibody Screen: NEGATIVE
UNIT DIVISION: 0

## 2018-01-23 LAB — GLUCOSE, CAPILLARY: Glucose-Capillary: 93 mg/dL (ref 65–99)

## 2018-01-23 LAB — BPAM RBC
Blood Product Expiration Date: 201905082359
ISSUE DATE / TIME: 201905051203
Unit Type and Rh: 9500

## 2018-01-23 MED ORDER — ACETAMINOPHEN 325 MG PO TABS: 650.0000 mg | ORAL_TABLET | Freq: Four times a day (QID) | ORAL | 2 refills | Status: AC | PRN

## 2018-01-23 MED ORDER — MAGNESIUM HYDROXIDE 400 MG/5ML PO SUSP
30.0000 mL | Freq: Two times a day (BID) | ORAL | Status: DC
  Administered 2018-01-23: 30 mL via ORAL
  Filled 2018-01-23: qty 30

## 2018-01-23 MED ORDER — POLYETHYLENE GLYCOL 3350 17 G PO PACK: 17.0000 g | PACK | Freq: Every day | ORAL | 0 refills | Status: DC

## 2018-01-23 MED ORDER — TRAMADOL HCL 50 MG PO TABS: 50.0000 mg | ORAL_TABLET | Freq: Four times a day (QID) | ORAL | 0 refills | Status: DC | PRN

## 2018-01-23 MED ORDER — BISACODYL 10 MG RE SUPP
10.0000 mg | Freq: Once | RECTAL | Status: AC
  Administered 2018-01-23: 10 mg via RECTAL
  Filled 2018-01-23: qty 1

## 2018-01-23 MED ORDER — MAGNESIUM HYDROXIDE 400 MG/5ML PO SUSP
30.0000 mL | Freq: Every day | ORAL | Status: DC | PRN
Start: 2018-01-23 — End: 2018-01-23

## 2018-01-23 MED ORDER — ENSURE ENLIVE PO LIQD: 237.0000 mL | Freq: Two times a day (BID) | ORAL | 12 refills | Status: AC

## 2018-01-23 NOTE — Discharge Summary (Signed)
[facility]   PATIENT NAME: Alison Russo    MR#:  [id number]  DATE OF BIRTH:  [birth year]  DATE OF ADMISSION:  01/17/2018   ADMITTING PHYSICIAN: Cammy Copa, MD  DATE OF DISCHARGE: 01/23/2018  PRIMARY CARE PHYSICIAN: Lyndon Code, MD   ADMISSION DIAGNOSIS:   Symptomatic anemia [D64.9] Hypotension, unspecified hypotension type [I95.9]  DISCHARGE DIAGNOSIS:   Active Problems:   Hematoma of leg, left, initial encounter   SECONDARY DIAGNOSIS:   Past Medical History:  Diagnosis Date  . Anginal pain (HCC)   . Anxiety   . Depression   . Dyspnea   . Heart murmur   . Hypertension   . NSTEMI (non-ST elevated myocardial infarction) (HCC) 12/22/2017  . OSA on CPAP   . PE (pulmonary embolism)     HOSPITAL COURSE:   [name] a84 y.o.femalewith a known history of PE, on Coumadin, hypertension, coronary artery disease status post 2 stent placement approximately 3 weeks ago. Patient was brought to emergency room for acute onset of nausea,diaphoresis and generalized weakness,going on for the past 24 hours. Her blood pressure was low, 80/60, when paramedics arrived to her house. She was noted with a large area of bruising and hematoma at left thigh and left groin area,status post left femoral artery catheterization from April 24,2019.  1.Left thigh hematoma,status post femoral artery catheterization 4 weeks ago-patient had 2 cardiac catheterizations done within a span of 2 days and was on aspirin, Plavix and Coumadin. - due to Large Pseudoaneurysm -INR was supratherapeutic on admission, received vitamin K with reversal. -Appreciate vascular surgery consult.  Patient is status post thrombin injection into pseudoaneurysm on 01/20/2018 -Physical therapy consulted-recommended rehab  2.Anemia,- acute secondary to acute blood loss. -came in with hemoglobin of 6.5- status post total 3 units packed RBC transfusion this  admission and hemoglobin at 8.4 this morning - keep Hb close to 8 - Recheck hemoglobin in 3 to 4 days  3.Hypotension,secondary to blood loss,responded to fluids. -Fluids discontinued now  4.History of chronic PE.>2 years ago - no recent CT chest suggesting acute PE and dopplers no DVT -We will have to discontinue warfarin for now,due to left thigh hematoma and severe anemia. -can reevaluate the need for anticoagulant in the future.  5.Coronary artery disease,post recent stent placement by [facility]. Patient had LAD drug-eluting stents placed about 4 to 5 weeks ago. -restarted Plavix -Continue to hold aspirin for now.  On statin and metoprolol  Discussed with family, vascular surgery PA and patient at bedside.  They have decided to pursue rehab at this time    DISCHARGE CONDITIONS:   Guarded  CONSULTS OBTAINED:   Treatment Team:  Renford Dills, MD  DRUG ALLERGIES:   No Known Allergies DISCHARGE MEDICATIONS:   Allergies as of 01/23/2018   No Known Allergies     Medication List    STOP taking these medications   aspirin 81 MG EC tablet   CALCIUM PO   Cyanocobalamin 2500 MCG Subl   loratadine 10 MG tablet Commonly known as:  CLARITIN   nitroGLYCERIN 0.4 MG SL tablet Commonly known as:  NITROSTAT   vitamin C 500 MG tablet Commonly known as:  ASCORBIC ACID   Vitamin D 2000 units tablet   warfarin 5 MG tablet Commonly known as:  COUMADIN     TAKE these medications   acetaminophen 325 MG tablet Commonly known as:  TYLENOL Take 2 tablets (650 mg total) by mouth every 6 (six)  hours as needed for mild pain (or Fever >/= 101). What changed:    medication strength  how much to take  when to take this  reasons to take this   atorvastatin 80 MG tablet Commonly known as:  LIPITOR Take 1 tablet (80 mg total) by mouth daily at 6 PM.   clopidogrel 75 MG tablet Commonly known as:  PLAVIX Take 1 tablet (75 mg total) by mouth daily with  breakfast.   feeding supplement (ENSURE ENLIVE) Liqd Take 237 mLs by mouth 2 (two) times daily between meals. Start taking on:  01/24/2018   metoprolol tartrate 12.5 mg Tabs tablet Commonly known as:  LOPRESSOR Take 12.5 mg by mouth 2 (two) times daily.   OPCON-A OP Place 1-2 drops into both eyes 3 (three) times daily as needed (for itchy/irritated eyes.).   pantoprazole 40 MG tablet Commonly known as:  PROTONIX Take 1 tablet (40 mg total) by mouth daily.   polyethylene glycol packet Commonly known as:  MIRALAX / GLYCOLAX Take 17 g by mouth daily. Hold if >2 bowel movements /day   PRESERVISION AREDS 2+MULTI VIT PO Take 1 tablet by mouth 2 (two) times daily.   sertraline 100 MG tablet Commonly known as:  ZOLOFT Take 100 mg by mouth daily.   traMADol 50 MG tablet Commonly known as:  ULTRAM Take 1 tablet (50 mg total) by mouth every 6 (six) hours as needed for moderate pain.   traZODone 50 MG tablet Commonly known as:  DESYREL Take 0.5 tablets (25 mg total) by mouth at bedtime as needed for sleep.        DISCHARGE INSTRUCTIONS:   1. Hemoglobin check in 3-4 days 2. PCP f/u in 1 week 3. Cardiology f/u in 2 weeks 4. Vascular f/u in 2 weeks  DIET:   Cardiac diet  ACTIVITY:   Activity as tolerated  OXYGEN:   Home Oxygen: No.  Oxygen Delivery: room air  DISCHARGE LOCATION:   nursing home   If you experience worsening of your admission symptoms, develop shortness of breath, life threatening emergency, suicidal or homicidal thoughts you must seek medical attention immediately by calling 911 or calling your MD immediately  if symptoms less severe.  You Must read complete instructions/literature along with all the possible adverse reactions/side effects for all the Medicines you take and that have been prescribed to you. Take any new Medicines after you have completely understood and accpet all the possible adverse reactions/side effects.   Please note  You were  cared for by a hospitalist during your hospital stay. If you have any questions about your discharge medications or the care you received while you were in the hospital after you are discharged, you can call the unit and asked to speak with the hospitalist on call if the hospitalist that took care of you is not available. Once you are discharged, your primary care physician will handle any further medical issues. Please note that NO REFILLS for any discharge medications will be authorized once you are discharged, as it is imperative that you return to your primary care physician (or establish a relationship with a primary care physician if you do not have one) for your aftercare needs so that they can reassess your need for medications and monitor your lab values.    On the day of Discharge:  VITAL SIGNS:   Blood pressure 137/64, pulse 75, temperature 98.9 F (37.2 C), temperature source Oral, resp. rate 17, height  (1.651 m), weight 92.5  kg (204 lb), SpO2 96 %.  PHYSICAL EXAMINATION:   GENERAL:  84 y.o.-year-old elderly patient sitting in the bed with no acute distress.  EYES: Pupils equal, round, reactive to light and accommodation. No scleral icterus. Extraocular muscles intact.  HEENT: Head atraumatic, normocephalic. Oropharynx and nasopharynx clear.  NECK:  Supple, no jugular venous distention. No thyroid enlargement, no tenderness.  LUNGS: Normal breath sounds bilaterally, no wheezing but some upper airway wheeze noted. No rales,rhonchi or crepitation. No use of accessory muscles of respiration.  CARDIOVASCULAR: S1, S2 normal. No  rubs, or gallops. 3/6 systolic murmur present. ABDOMEN: Soft, nontender, nondistended. Bowel sounds present. No organomegaly or mass.  EXTREMITIES: No pedal edema, cyanosis, or clubbing. Significant bruising and ecchymosis of left thigh, groin region extending onto the [facility] pubis,and extending proximally and laterally onto lower abdomen- tense and tender on  palpation. No active bleeding from left groin site NEUROLOGIC: Cranial nerves II through XII are intact. Muscle strength 5/5 in all extremities. Sensation intact. Gait not checked. Global weakness PSYCHIATRIC: The patient is alert and oriented x 3.  SKIN: No obvious rash, lesion, or ulcer.     DATA REVIEW:   CBC Recent Labs  Lab 01/23/18 0352  WBC 8.5  HGB 8.4*  HCT 24.8*  PLT 212    Chemistries  Recent Labs  Lab 01/19/18 0734 01/20/18 0441  NA 138  137 139  K 4.0  3.9 3.5  CL 110  110 109  CO2 GLUCOSE 99  102* 117*  BUN 20  22* 14  CREATININE 0.63  0.76 0.64  CALCIUM 7.9*  8.0* 8.1*  MG  --  1.8  AST 20  --   ALT 16  --   ALKPHOS 102  --   BILITOT 1.2  --      Microbiology Results  No results found for this or any previous visit.  RADIOLOGY:  No results found.   Management plans discussed with the patient, family and they are in agreement.  CODE STATUS:     Code Status Orders  (From admission, onward)        Start     Ordered   01/18/18 0114  Full code  Continuous     01/18/18 0113    Code Status History    Date Active Date Inactive Code Status Order ID Comments User Context   01/11/2018 1358 01/11/2018 1911 Full Code [id number]  Annice Needy, MD Inpatient   12/22/2017 1940 12/26/2017 1525 Full Code [id number]  Allayne Butcher, PA-C Inpatient   12/21/2017 0207 12/22/2017 1839 Full Code [id number]  Cammy Copa, MD ED    Advance Directive Documentation     Most Recent Value  Type of Advance Directive  Healthcare Power of Attorney, Living will  Pre-existing out of facility DNR order (yellow form or pink MOST form)  -  "MOST" Form in Place?  -      TOTAL TIME TAKING CARE OF THIS PATIENT: 38 minutes.    Enid Baas M.D on 01/23/2018 at 2:09 PM  Between 7am to 6pm - Pager - [phone]  After 6pm go to [email or web address] - password [email or web address] [facility]  [facility]  Hospitalists  Office  [phone]  CC: Primary care  physician; Lyndon Code, MD   Note: This dictation was prepared with Dragon dictation along with smaller phrase technology. Any transcriptional errors that result from this process are unintentional.

## 2018-01-23 NOTE — Progress Notes (Addendum)
Clinical Education officer, museum (CSW) met with patient to present bed offers. Patient chose [facility]. [facility] SNF authorization has been started. CSW made patient aware that she will have a co-pay of $10 to $20 per day at [facility] under [place] admissions coordinator at [facility] is aware of above. CSW attempted to contact patient's daughter Hassan Rowan however she did not answer and a voicemail was left.   Patient's daughter Hassan Rowan called CSW back and was made aware of above.   [employer], LCSW [phone]

## 2018-01-23 NOTE — Clinical Social Work Placement (Signed)
   CLINICAL SOCIAL WORK PLACEMENT  NOTE  Date:  01/23/2018  Patient Details  Name: Alison Russo MRN: [id number] Date of Birth: 09-11-1926  Clinical Social Work is seeking post-discharge placement for this patient at the Skilled  Nursing Facility level of care (*CSW will initial, date and re-position this form in  chart as items are completed):  Yes   Patient/family provided with [place] Clinical Social Work Department's list of facilities offering this level of care within the geographic area requested by the patient (or if unable, by the patient's family).  Yes   Patient/family informed of their freedom to choose among providers that offer the needed level of care, that participate in Medicare, Medicaid or managed care program needed by the patient, have an available bed and are willing to accept the patient.  Yes   Patient/family informed of [place]'s ownership interest in [facility] and [facility], as well as of the fact that they are under no obligation to receive care at these facilities.  PASRR submitted to EDS on 01/20/18     PASRR number received on 01/20/18     Existing PASRR number confirmed on       FL2 transmitted to all facilities in geographic area requested by pt/family on 01/22/18     FL2 transmitted to all facilities within larger geographic area on       Patient informed that his/her managed care company has contracts with or will negotiate with certain facilities, including the following:        Yes   Patient/family informed of bed offers received.  Patient chooses bed at [facility] )     Physician recommends and patient chooses bed at      Patient to be transferred to [facility] ) on 01/23/18.  Patient to be transferred to facility by [facility] )     Patient family notified on 01/23/18 of transfer.  Name of family member notified:  (Patient's daughter Alison Russo is at bedisde and aware of D/C today. )     PHYSICIAN        Additional Comment:    _______________________________________________ Witt Plitt, Darleen Crocker, LCSW 01/23/2018, 2:47 PM

## 2018-01-23 NOTE — Care Management Important Message (Signed)
Copy of signed IM left in patient's room.    

## 2018-01-23 NOTE — Progress Notes (Signed)
Pt ready for d/c to SNF today per MD. Pt did have small BM after suppository given. Report given to [place] at [facility], all questions answered. Pt's belongings packed and daughter took with her. EMS set up to transport pt to facility. Pt has been ambulating in room to bathroom and chair today, in NAD, VSS.   [place], Latricia Heft

## 2018-01-23 NOTE — Progress Notes (Signed)
Patient is medically stable for D/C to [facility] today. Per Sue L[place] admissions coordinator at [facility] patient can come today to room 311. [facility] SNF authorization has been received. RN will call report at [phone] and arrange EMS for transport. Clinical Child psychotherapist (CSW) sent D/C orders to [facility] via [place]. Patient is aware of above. Patient's daughter Steward Drone is at bedside and aware of above. Please reconsult if future social work needs arise. CSW signing off.   [employer], LCSW [phone]

## 2018-01-23 NOTE — Progress Notes (Signed)
[facility] SN[facility] authorization has been received for 7 days, authorization # [id number]. Patient is aware of above. Clinical Social Worker (CSW) contacted patient's daughter Steward Drone and made her aware of above. CSW left a voicemail for [employer] at [facility] making her aware of above.   [employer], LCSW [phone]

## 2018-01-23 NOTE — Clinical Social Work Placement (Signed)
   CLINICAL SOCIAL WORK PLACEMENT  NOTE  Date:  01/23/2018  Patient Details  Name: Alison Russo MRN: [id number] Date of Birth: 05-May-1926  Clinical Social Work is seeking post-discharge placement for this patient at the Skilled  Nursing Facility level of care (*CSW will initial, date and re-position this form in  chart as items are completed):  Yes   Patient/family provided with [place] Clinical Social Work Department's list of facilities offering this level of care within the geographic area requested by the patient (or if unable, by the patient's family).  Yes   Patient/family informed of their freedom to choose among providers that offer the needed level of care, that participate in Medicare, Medicaid or managed care program needed by the patient, have an available bed and are willing to accept the patient.  Yes   Patient/family informed of [place] ownership interest in [facility] and [facility], as well as of the fact that they are under no obligation to receive care at these facilities.  PASRR submitted to EDS on 01/20/18     PASRR number received on 01/20/18     Existing PASRR number confirmed on       FL2 transmitted to all facilities in geographic area requested by pt/family on 01/22/18     FL2 transmitted to all facilities within larger geographic area on       Patient informed that his/her managed care company has contracts with or will negotiate with certain facilities, including the following:        Yes   Patient/family informed of bed offers received.  Patient chooses bed at [facility] )     Physician recommends and patient chooses bed at      Patient to be transferred to   on  .  Patient to be transferred to facility by       Patient family notified on   of transfer.  Name of family member notified:        PHYSICIAN       Additional Comment:    _______________________________________________ Jacaria Colburn, Darleen Crocker, LCSW 01/23/2018,  8:48 AM

## 2018-01-24 ENCOUNTER — Ambulatory Visit: Payer: Self-pay | Admitting: Internal Medicine

## 2018-01-24 ENCOUNTER — Other Ambulatory Visit: Payer: PPO

## 2018-01-25 ENCOUNTER — Ambulatory Visit (INDEPENDENT_AMBULATORY_CARE_PROVIDER_SITE_OTHER): Payer: PPO | Admitting: Vascular Surgery

## 2018-01-25 ENCOUNTER — Telehealth (INDEPENDENT_AMBULATORY_CARE_PROVIDER_SITE_OTHER): Payer: Self-pay

## 2018-01-25 NOTE — Telephone Encounter (Signed)
Nurse from [facility] stated that they just took over care for this patient, when she was last seen here in the office Unna boots were ordered.   The nurse states that the patient is requesting that the Unna boots be discontinued because the swelling has gone down, but she did notate that this Saturday would be the day that the Unna boot would need to be changed?  [phone] Elnita Maxwell)

## 2018-01-25 NOTE — Telephone Encounter (Signed)
I do not recommend that the unna boots be discontinued.  The patient is now in rehab and will be undergoing physical therapy.  Her legs will start to swell again.  She has been making great progress in controlling her edema and if she removes the unna boot she will lose some of that progress.  If she insists on removing the unna boot she should transition into medical grade 1 (20-41mmgh) compression socks. The patient needs some type of compression to the bilateral lower extremity placed in the morning and removed in the evening.  The patient does not need to sleep in the stockings.

## 2018-01-25 NOTE — Telephone Encounter (Signed)
Called th nurse back, she was gone for the day so I left a message with the second shift nurse letting her know that the patient should continue wearing the Unna boots at this time per the PA, Kim. She stated that she would notify the nurse Elnita Maxwell and the patient.

## 2018-01-26 DIAGNOSIS — I2510 Atherosclerotic heart disease of native coronary artery without angina pectoris: Secondary | ICD-10-CM | POA: Diagnosis not present

## 2018-01-26 DIAGNOSIS — I724 Aneurysm of artery of lower extremity: Secondary | ICD-10-CM | POA: Diagnosis not present

## 2018-01-26 DIAGNOSIS — F39 Unspecified mood [affective] disorder: Secondary | ICD-10-CM | POA: Diagnosis not present

## 2018-01-26 DIAGNOSIS — Z86711 Personal history of pulmonary embolism: Secondary | ICD-10-CM | POA: Diagnosis not present

## 2018-01-26 DIAGNOSIS — I10 Essential (primary) hypertension: Secondary | ICD-10-CM | POA: Diagnosis not present

## 2018-01-30 ENCOUNTER — Ambulatory Visit: Payer: Self-pay | Admitting: *Deleted

## 2018-01-30 DIAGNOSIS — R2242 Localized swelling, mass and lump, left lower limb: Secondary | ICD-10-CM | POA: Diagnosis not present

## 2018-01-31 ENCOUNTER — Other Ambulatory Visit: Payer: Self-pay | Admitting: *Deleted

## 2018-01-31 NOTE — Progress Notes (Signed)
This encounter was created in error - please disregard.

## 2018-01-31 NOTE — Addendum Note (Signed)
Addended by: Wenda Overland on: 01/31/2018 01:31 PM   Modules accepted: Level of Service, SmartSet

## 2018-01-31 NOTE — Patient Outreach (Signed)
[facility]) Care Management  01/31/2018  PARTICIA STRAHM 06/19/1926 [id number]   Phone call to patient to assess for community resource needs following her rehab stay at [facility]. This social worker went to visit patient today at [facility], however she had discharged before this social worker was able to meet with her. [facility] care management program discussed with patient. Patient states that at this time she does not feel that she needs care management services. Per patient, she has a supportive family,(patient's daughter and son-in law at the home at the time of the call)  can complete her own ADL's and manage her own medication. "I just put a load of clothes in the washer" "So far, I can handle things on my own".Patient confirms that all of her hone care needs have been met.  Patient states that she is willing to have home health services which was ordered before her discharge from [facility].  Patient was very grateful for the call but declined need for case management services at this time.    Sheralyn Boatman [facility] Care Management [phone]

## 2018-02-01 ENCOUNTER — Encounter (INDEPENDENT_AMBULATORY_CARE_PROVIDER_SITE_OTHER): Payer: Self-pay | Admitting: Vascular Surgery

## 2018-02-01 ENCOUNTER — Ambulatory Visit (INDEPENDENT_AMBULATORY_CARE_PROVIDER_SITE_OTHER): Payer: PPO | Admitting: Vascular Surgery

## 2018-02-01 VITALS — BP 132/80 | HR 74 | Resp 16 | Ht 65.0 in | Wt 195.8 lb

## 2018-02-01 DIAGNOSIS — Z7901 Long term (current) use of anticoagulants: Secondary | ICD-10-CM | POA: Diagnosis not present

## 2018-02-01 DIAGNOSIS — I890 Lymphedema, not elsewhere classified: Secondary | ICD-10-CM | POA: Diagnosis not present

## 2018-02-01 DIAGNOSIS — T148XXA Other injury of unspecified body region, initial encounter: Secondary | ICD-10-CM

## 2018-02-01 NOTE — Progress Notes (Signed)
Subjective:    Patient ID: Alison Russo, female    DOB: 1925/12/03, 84 y.o.   MRN: [id number] Chief Complaint  Patient presents with  . Wound Check    [facility] 2wk follow up   Patient presents for her first post hospital follow-up.  The patient was recently hospitalized for a pseudoaneurysm to the left femoral artery status post a right lower extremity angiogram.  The patient was also supratherapeutic on her Coumadin which subsequently led to a hematoma to the left thigh.  This hematoma was extensive.  The patient was discharged to rehab for which she recently has been discharged from rehab is now residing back at home.  The patient presents for a hematoma check and to discuss moving forward with controlling the edema to her bilateral lower extremity.  Since her initial visit, the patient has been in 3 layer zinc oxide Unna wraps or medical grade 1 compression socks with minimal improvement to the edema to her bilateral lower extremity.  The patient notes that her swelling is worse towards the end of the day.  The swelling is associated with discomfort.  The patient states that her symptoms have progressed to the point that she is unable to function on a daily basis.  Her symptoms have become lifestyle limiting.  The patient denies any claudication-like symptoms, rest pain or ulceration to the bilateral lower extremity.  The patient notes some improvement to her left thigh hematoma however there still is pain and bruising to the area.  The patient was taken off Coumadin while she was an inpatient and she is now on aspirin and Plavix.  The patient denies any bleeding issues on these medications.  The patient denies any fever, nausea or vomiting.  Review of Systems  Constitutional: Negative.   HENT: Negative.   Eyes: Negative.   Respiratory: Negative.   Cardiovascular:       Lymphedema Left Thigh Hematoma  Gastrointestinal: Negative.   Endocrine: Negative.   Genitourinary: Negative.     Musculoskeletal: Negative.   Skin: Negative.   Allergic/Immunologic: Negative.   Neurological: Negative.   Hematological: Negative.   Psychiatric/Behavioral: Negative.       Objective:   Physical Exam  Constitutional: She is oriented to person, place, and time. She appears well-developed and well-nourished.  HENT:  Head: Normocephalic and atraumatic.  Right Ear: External ear normal.  Left Ear: External ear normal.  Eyes: Pupils are equal, round, and reactive to light. Conjunctivae and EOM are normal.  Neck: Normal range of motion.  Cardiovascular: Normal rate, regular rhythm, normal heart sounds and intact distal pulses.  Pulses:      Radial pulses are 2+ on the right side, and 2+ on the left side.  Hard to palpate pedal pulses however the bilateral feet are warm  Pulmonary/Chest: Effort normal and breath sounds normal.  Musculoskeletal: Normal range of motion. She exhibits edema (Left thigh: Moderate nonpitting edema noted.  There is ecchymosis to the area.  Thigh is soft.  Calf is soft.  Moderate 1+ pitting edema noted to the bilateral lower extremity.).  Neurological: She is alert and oriented to person, place, and time.  Skin: Skin is warm and dry.  Psychiatric: She has a normal mood and affect. Her behavior is normal. Judgment and thought content normal.  Vitals reviewed.  BP 132/80 (BP Location: Right Arm)   Pulse 74   Resp 16   Ht  (1.651 m)   Wt 195 lb 12.8 oz (88.8 kg)  BMI 32.58 kg/m   Past Medical History:  Diagnosis Date  . Anginal pain (HCC)   . Anxiety   . Depression   . Dyspnea   . Heart murmur   . Hypertension   . NSTEMI (non-ST elevated myocardial infarction) (HCC) 12/22/2017  . OSA on CPAP   . PE (pulmonary embolism)    Social History   Socioeconomic History  . Marital status: Widowed    Spouse name: Not on file  . Number of children: Not on file  . Years of education: Not on file  . Highest education level: Not on file  Occupational  History  . Not on file  Social Needs  . Financial resource strain: Not on file  . Food insecurity:    Worry: Not on file    Inability: Not on file  . Transportation needs:    Medical: Not on file    Non-medical: Not on file  Tobacco Use  . Smoking status: Never Smoker  . Smokeless tobacco: Never Used  Substance and Sexual Activity  . Alcohol use: Never    Frequency: Never  . Drug use: Never  . Sexual activity: Not Currently  Lifestyle  . Physical activity:    Days per week: Not on file    Minutes per session: Not on file  . Stress: Not on file  Relationships  . Social connections:    Talks on phone: Not on file    Gets together: Not on file    Attends religious service: Not on file    Active member of club or organization: Not on file    Attends meetings of clubs or organizations: Not on file    Relationship status: Not on file  . Intimate partner violence:    Fear of current or ex partner: Not on file    Emotionally abused: Not on file    Physically abused: Not on file    Forced sexual activity: Not on file  Other Topics Concern  . Not on file  Social History Narrative  . Not on file   Past Surgical History:  Procedure Laterality Date  . BREAST CYST ASPIRATION Right    neg  . CARDIAC CATHETERIZATION    . CATARACT EXTRACTION, BILATERAL    . COLONOSCOPY    . CORONARY STENT INTERVENTION N/A 12/23/2017   Procedure: CORONARY STENT INTERVENTION;  Surgeon: Swaziland, Peter M, MD;  Location: [facility] INVASIVE CV LAB;  Service: Cardiovascular;  Laterality: N/A;  . EYE SURGERY Left    "blood vessel busted behind my eye"  . EYE SURGERY Right    ?vitrectomy  . HYSTEROSCOPY W/D&C N/A 10/25/2016   Procedure: DILATATION AND CURETTAGE /HYSTEROSCOPY, POLYPECTOMY;  Surgeon: Christeen Douglas, MD;  Location: [facility] ORS;  Service: Gynecology;  Laterality: N/A;  . INGUINAL HERNIA REPAIR Right 1950s  . LEFT HEART CATH AND CORONARY ANGIOGRAPHY N/A 12/21/2017   Procedure: LEFT HEART CATH AND CORONARY  ANGIOGRAPHY possible PCI and stent;  Surgeon: Alwyn Pea, MD;  Location: [facility] INVASIVE CV LAB;  Service: Cardiovascular;  Laterality: N/A;  . LOWER EXTREMITY ANGIOGRAPHY Right 01/11/2018   Procedure: LOWER EXTREMITY ANGIOGRAPHY;  Surgeon: Annice Needy, MD;  Location: [facility] INVASIVE CV LAB;  Service: Cardiovascular;  Laterality: Right;  . PSEUDOANERYSM COMPRESSION Left 01/20/2018   Procedure: PSEUDOANERYSM COMPRESSION;  Surgeon: Renford Dills, MD;  Location: [facility] INVASIVE CV LAB;  Service: Cardiovascular;  Laterality: Left;  . TONSILLECTOMY     Family History  Problem Relation Age of Onset  .  Breast cancer Mother 100   No Known Allergies     Assessment & Plan:  Patient presents for her first post hospital follow-up.  The patient was recently hospitalized for a pseudoaneurysm to the left femoral artery status post a right lower extremity angiogram.  The patient was also supratherapeutic on her Coumadin which subsequently led to a hematoma to the left thigh.  This hematoma was extensive.  The patient was discharged to rehab for which she recently has been discharged from rehab is now residing back at home.  The patient presents for a hematoma check and to discuss moving forward with controlling the edema to her bilateral lower extremity.  Since her initial visit, the patient has been in 3 layer zinc oxide Unna wraps or medical grade 1 compression socks with minimal improvement to the edema to her bilateral lower extremity.  The patient notes that her swelling is worse towards the end of the day.  The swelling is associated with discomfort.  The patient states that her symptoms have progressed to the point that she is unable to function on a daily basis.  Her symptoms have become lifestyle limiting.  The patient denies any claudication-like symptoms, rest pain or ulceration to the bilateral lower extremity.  The patient notes some improvement to her left thigh hematoma however there still is pain  and bruising to the area.  The patient was taken off Coumadin while she was an inpatient and she is now on aspirin and Plavix.  The patient denies any bleeding issues on these medications.  The patient denies any fever, nausea or vomiting.  1. Chronic anticoagulation - Stable The patient is no longer on Coumadin. The patient is now on aspirin and Plavix. The patient is not having any adverse side effects to these medications.  2. Hematoma - Stable The patient is now home discharged from rehab. Patient's family who was with her during this visit states that she is moving around well. The patient notes some improvement to her left thigh hematoma. I had another long discussion with the patient about the hematoma and how extensive it was then how this will cause it to take a few weeks to heal There are no signs of infection noted to the thigh Patient to continue to elevate her leg and warm compresses to the area  3. Lymphedema - New Despite conservative treatments including exercise, elevation, 3 layer zinc oxide Unna wraps to the bilateral lower extremity, class I compression socks the patient still presents with stage II lymphedema. The patient will greatly benefit from the added therapy of a lymphedema pump I will applied to the patient's insurance Recommend continuing 3 layer zinc oxide and wraps to the bilateral lower extremity into the patient's edema is controlled. The patient and her daughter who was present at this visit expressed her understanding Patient to follow-up in 1 month  Current Outpatient Medications on File Prior to Visit  Medication Sig Dispense Refill  . acetaminophen (TYLENOL) 325 MG tablet Take 2 tablets (650 mg total) by mouth every 6 (six) hours as needed for mild pain (or Fever >/= 101). 30 tablet 2  . atorvastatin (LIPITOR) 80 MG tablet Take 1 tablet (80 mg total) by mouth daily at 6 PM. 30 tablet 0  . clopidogrel (PLAVIX) 75 MG tablet Take 1 tablet (75 mg total)  by mouth daily with breakfast. 30 tablet 3  . feeding supplement, ENSURE ENLIVE, (ENSURE ENLIVE) LIQD Take 237 mLs by mouth 2 (two) times daily between  meals. 237 mL 12  . metoprolol tartrate (LOPRESSOR) 12.5 mg TABS tablet Take 12.5 mg by mouth 2 (two) times daily.    . Multiple Vitamins-Minerals (PRESERVISION AREDS 2+MULTI VIT PO) Take 1 tablet by mouth 2 (two) times daily.    . Naphazoline-Pheniramine (OPCON-A OP) Place 1-2 drops into both eyes 3 (three) times daily as needed (for itchy/irritated eyes.).    Marland Kitchen pantoprazole (PROTONIX) 40 MG tablet Take 1 tablet (40 mg total) by mouth daily. 30 tablet 0  . polyethylene glycol (MIRALAX / GLYCOLAX) packet Take 17 g by mouth daily. Hold if >2 bowel movements /day 14 each 0  . sertraline (ZOLOFT) 100 MG tablet Take 100 mg by mouth daily.    . traMADol (ULTRAM) 50 MG tablet Take 1 tablet (50 mg total) by mouth every 6 (six) hours as needed for moderate pain. (Patient not taking: Reported on 02/01/2018) 30 tablet 0  . traZODone (DESYREL) 50 MG tablet Take 0.5 tablets (25 mg total) by mouth at bedtime as needed for sleep. (Patient not taking: Reported on 02/01/2018) 30 tablet 0   No current facility-administered medications on file prior to visit.    There are no Patient Instructions on file for this visit. No follow-ups on file.  Iyesha Such A Kamera Dubas, PA-C

## 2018-02-02 ENCOUNTER — Ambulatory Visit: Payer: Self-pay | Admitting: *Deleted

## 2018-02-02 ENCOUNTER — Ambulatory Visit (INDEPENDENT_AMBULATORY_CARE_PROVIDER_SITE_OTHER): Payer: PPO | Admitting: Vascular Surgery

## 2018-02-04 ENCOUNTER — Other Ambulatory Visit: Payer: Self-pay | Admitting: Internal Medicine

## 2018-02-06 ENCOUNTER — Ambulatory Visit (INDEPENDENT_AMBULATORY_CARE_PROVIDER_SITE_OTHER): Payer: PPO | Admitting: Vascular Surgery

## 2018-02-06 ENCOUNTER — Other Ambulatory Visit: Payer: Self-pay | Admitting: Internal Medicine

## 2018-02-08 ENCOUNTER — Encounter (INDEPENDENT_AMBULATORY_CARE_PROVIDER_SITE_OTHER): Payer: Self-pay

## 2018-02-08 ENCOUNTER — Ambulatory Visit (INDEPENDENT_AMBULATORY_CARE_PROVIDER_SITE_OTHER): Payer: PPO | Admitting: Vascular Surgery

## 2018-02-08 ENCOUNTER — Ambulatory Visit: Payer: Self-pay | Admitting: Internal Medicine

## 2018-02-08 ENCOUNTER — Ambulatory Visit: Payer: PPO | Admitting: Internal Medicine

## 2018-02-08 VITALS — BP 127/77 | HR 73 | Resp 16 | Ht 65.0 in | Wt 194.4 lb

## 2018-02-08 DIAGNOSIS — I480 Paroxysmal atrial fibrillation: Secondary | ICD-10-CM | POA: Diagnosis not present

## 2018-02-08 DIAGNOSIS — Z955 Presence of coronary angioplasty implant and graft: Secondary | ICD-10-CM | POA: Diagnosis not present

## 2018-02-08 DIAGNOSIS — I9789 Other postprocedural complications and disorders of the circulatory system, not elsewhere classified: Secondary | ICD-10-CM | POA: Diagnosis not present

## 2018-02-08 DIAGNOSIS — I10 Essential (primary) hypertension: Secondary | ICD-10-CM | POA: Diagnosis not present

## 2018-02-08 DIAGNOSIS — I214 Non-ST elevation (NSTEMI) myocardial infarction: Secondary | ICD-10-CM | POA: Diagnosis not present

## 2018-02-08 DIAGNOSIS — I209 Angina pectoris, unspecified: Secondary | ICD-10-CM | POA: Diagnosis not present

## 2018-02-08 DIAGNOSIS — I890 Lymphedema, not elsewhere classified: Secondary | ICD-10-CM

## 2018-02-08 DIAGNOSIS — R000 Tachycardia, unspecified: Secondary | ICD-10-CM | POA: Diagnosis not present

## 2018-02-08 DIAGNOSIS — I729 Aneurysm of unspecified site: Secondary | ICD-10-CM | POA: Diagnosis not present

## 2018-02-08 DIAGNOSIS — R011 Cardiac murmur, unspecified: Secondary | ICD-10-CM | POA: Diagnosis not present

## 2018-02-08 NOTE — Progress Notes (Signed)
History of Present Illness  There is no documented history at this time  Assessments & Plan   There are no diagnoses linked to this encounter.    Additional instructions  Subjective:  Patient presents with venous ulcer of the Left lower extremity.    Procedure:  3 layer unna wrap was placed Left lower extremity.   Plan:   Follow up in one week.  

## 2018-02-09 ENCOUNTER — Encounter: Payer: Self-pay | Admitting: Internal Medicine

## 2018-02-09 DIAGNOSIS — F39 Unspecified mood [affective] disorder: Secondary | ICD-10-CM | POA: Insufficient documentation

## 2018-02-15 ENCOUNTER — Encounter (INDEPENDENT_AMBULATORY_CARE_PROVIDER_SITE_OTHER): Payer: Self-pay

## 2018-02-15 ENCOUNTER — Ambulatory Visit (INDEPENDENT_AMBULATORY_CARE_PROVIDER_SITE_OTHER): Payer: PPO | Admitting: Vascular Surgery

## 2018-02-15 VITALS — BP 135/80 | HR 67 | Resp 16 | Ht 65.0 in | Wt 194.4 lb

## 2018-02-15 DIAGNOSIS — I890 Lymphedema, not elsewhere classified: Secondary | ICD-10-CM | POA: Diagnosis not present

## 2018-02-15 NOTE — Progress Notes (Signed)
History of Present Illness  There is no documented history at this time  Assessments & Plan   There are no diagnoses linked to this encounter.    Additional instructions  Subjective:  Patient presents with venous ulcer of the Left lower extremity.    Procedure:  3 layer unna wrap was placed Left lower extremity.   Plan:   Follow up in one week.  

## 2018-02-20 DIAGNOSIS — R531 Weakness: Secondary | ICD-10-CM | POA: Diagnosis not present

## 2018-02-20 DIAGNOSIS — I2510 Atherosclerotic heart disease of native coronary artery without angina pectoris: Secondary | ICD-10-CM | POA: Diagnosis not present

## 2018-02-20 DIAGNOSIS — I480 Paroxysmal atrial fibrillation: Secondary | ICD-10-CM | POA: Diagnosis not present

## 2018-02-20 DIAGNOSIS — D649 Anemia, unspecified: Secondary | ICD-10-CM | POA: Diagnosis not present

## 2018-02-20 DIAGNOSIS — K219 Gastro-esophageal reflux disease without esophagitis: Secondary | ICD-10-CM | POA: Insufficient documentation

## 2018-02-20 DIAGNOSIS — S8012XA Contusion of left lower leg, initial encounter: Secondary | ICD-10-CM | POA: Diagnosis not present

## 2018-02-20 DIAGNOSIS — I10 Essential (primary) hypertension: Secondary | ICD-10-CM | POA: Diagnosis not present

## 2018-02-20 DIAGNOSIS — F329 Major depressive disorder, single episode, unspecified: Secondary | ICD-10-CM | POA: Diagnosis not present

## 2018-02-22 ENCOUNTER — Ambulatory Visit (INDEPENDENT_AMBULATORY_CARE_PROVIDER_SITE_OTHER): Payer: PPO | Admitting: Vascular Surgery

## 2018-02-22 ENCOUNTER — Encounter (INDEPENDENT_AMBULATORY_CARE_PROVIDER_SITE_OTHER): Payer: Self-pay

## 2018-02-22 VITALS — BP 130/69 | HR 59 | Resp 16 | Ht 65.0 in | Wt 195.6 lb

## 2018-02-22 DIAGNOSIS — I890 Lymphedema, not elsewhere classified: Secondary | ICD-10-CM | POA: Diagnosis not present

## 2018-02-22 NOTE — Progress Notes (Signed)
History of Present Illness  There is no documented history at this time  Assessments & Plan   There are no diagnoses linked to this encounter.    Additional instructions  Subjective:  Patient presents with venous ulcer of the Left lower extremity.    Procedure:  3 layer unna wrap was placed Left lower extremity.   Plan:   Follow up in one week.  

## 2018-03-01 ENCOUNTER — Encounter (INDEPENDENT_AMBULATORY_CARE_PROVIDER_SITE_OTHER): Payer: Self-pay | Admitting: Vascular Surgery

## 2018-03-01 ENCOUNTER — Ambulatory Visit (INDEPENDENT_AMBULATORY_CARE_PROVIDER_SITE_OTHER): Payer: PPO | Admitting: Vascular Surgery

## 2018-03-01 VITALS — BP 114/69 | HR 54 | Resp 15 | Ht 65.0 in | Wt 191.0 lb

## 2018-03-01 DIAGNOSIS — T148XXA Other injury of unspecified body region, initial encounter: Secondary | ICD-10-CM | POA: Diagnosis not present

## 2018-03-01 DIAGNOSIS — I890 Lymphedema, not elsewhere classified: Secondary | ICD-10-CM | POA: Diagnosis not present

## 2018-03-01 NOTE — Progress Notes (Signed)
Subjective:    Patient ID: Alison Russo, female    DOB: 07/15/1926, 84 y.o.   MRN: [id number] Chief Complaint  Patient presents with  . Follow-up    Unna boot check   The patient presents for a monthly lymphedema / unna boot therapy follow-up.  The patient has been treated with 3 layer zinc oxide Unna wraps for a few months in an attempt to control her lower extremity edema / weeping.  During her last visit, I have applied for lymphedema pump.  The patient's request has been approved and she is now waiting for the machine to arrive at her house.  The patient presents today with her daughter.  The patient presents today without complaint.  The hematoma to her left lower extremity continues to heal.  The patient notes an improvement in the bilateral lower extremity edema she was experiencing.  The patient's skin is completely healed.  The patient denies any claudication-like symptoms, rest pain or ulcer formation to the bilateral lower extremity.  The patient denies any fever, nausea vomiting.  Review of Systems  Constitutional: Negative.   HENT: Negative.   Eyes: Negative.   Respiratory: Negative.   Cardiovascular:       Lymphedema Left thigh hematoma  Gastrointestinal: Negative.   Endocrine: Negative.   Genitourinary: Negative.   Musculoskeletal: Negative.   Skin: Negative.   Allergic/Immunologic: Negative.   Neurological: Negative.   Hematological: Negative.   Psychiatric/Behavioral: Negative.       Objective:   Physical Exam  Constitutional: She is oriented to person, place, and time. She appears well-developed and well-nourished. No distress.  HENT:  Head: Normocephalic and atraumatic.  Right Ear: External ear normal.  Left Ear: External ear normal.  Eyes: Pupils are equal, round, and reactive to light. Conjunctivae and EOM are normal.  Neck: Normal range of motion.  Cardiovascular: Normal rate, regular rhythm, normal heart sounds and intact distal pulses.  Pulses:    Radial pulses are 2+ on the right side, and 2+ on the left side.       Dorsalis pedis pulses are 1+ on the right side, and 1+ on the left side.       Posterior tibial pulses are 1+ on the right side, and 1+ on the left side.  Pulmonary/Chest: Effort normal and breath sounds normal.  Musculoskeletal: Normal range of motion. She exhibits no edema.  Neurological: She is alert and oriented to person, place, and time.  Skin: Skin is warm and dry. She is not diaphoretic.  Psychiatric: She has a normal mood and affect. Her behavior is normal. Judgment and thought content normal.  Vitals reviewed.  BP 114/69 (BP Location: Right Arm, Patient Position: Sitting)   Pulse (!) 54   Resp 15   Ht 5\' 5"  (1.651 m)   Wt 191 lb (86.6 kg)   BMI 31.78 kg/m   Past Medical History:  Diagnosis Date  . Anginal pain (HCC)   . CAD (coronary artery disease)   . Heart murmur   . Hypertension   . Mood disorder (HCC)    mostly anxiety  . NSTEMI (non-ST elevated myocardial infarction) (HCC) 12/22/2017  . OSA on CPAP   . PE (pulmonary embolism)    recurrent   Social History   Socioeconomic History  . Marital status: Widowed    Spouse name: Not on file  . Number of children: 2  . Years of education: Not on file  . Highest education level: Not on file  Occupational History  . Occupation: Office work  . Occupation: Custodian    Comment: [employer]  Social Needs  . Financial resource strain: Not on file  . Food insecurity:    Worry: Not on file    Inability: Not on file  . Transportation needs:    Medical: Not on file    Non-medical: Not on file  Tobacco Use  . Smoking status: Never Smoker  . Smokeless tobacco: Never Used  Substance and Sexual Activity  . Alcohol use: Never    Frequency: Never  . Drug use: Never  . Sexual activity: Not Currently  Lifestyle  . Physical activity:    Days per week: Not on file    Minutes per session: Not on file  . Stress: Not on file  Relationships    . Social connections:    Talks on phone: Not on file    Gets together: Not on file    Attends religious service: Not on file    Active member of club or organization: Not on file    Attends meetings of clubs or organizations: Not on file    Relationship status: Not on file  . Intimate partner violence:    Fear of current or ex partner: Not on file    Emotionally abused: Not on file    Physically abused: Not on file    Forced sexual activity: Not on file  Other Topics Concern  . Not on file  Social History Narrative   Has living will   Daughter is health care POA   Would accept resuscitation attempts   No tube feeds if cognitively unaware   Past Surgical History:  Procedure Laterality Date  . BREAST CYST ASPIRATION Right    neg  . CARDIAC CATHETERIZATION    . CATARACT EXTRACTION, BILATERAL    . COLONOSCOPY    . CORONARY STENT INTERVENTION N/A 12/23/2017   Procedure: CORONARY STENT INTERVENTION;  Surgeon: SwazilandJordan, Peter M, MD;  [facility];  Service: Cardiovascular;  Laterality: N/A;  . EYE SURGERY Left    "blood vessel busted behind my eye"  . EYE SURGERY Right    ?vitrectomy  . HYSTEROSCOPY W/D&C N/A 10/25/2016   Procedure: DILATATION AND CURETTAGE /HYSTEROSCOPY, POLYPECTOMY;  Surgeon: Christeen DouglasBethany Beasley, MD;  Location: [facility] ORS;  Service: Gynecology;  Laterality: N/A;  . INGUINAL HERNIA REPAIR Right 1950s  . LEFT HEART CATH AND CORONARY ANGIOGRAPHY N/A 12/21/2017   Procedure: LEFT HEART CATH AND CORONARY ANGIOGRAPHY possible PCI and stent;  Surgeon: Alwyn Peaallwood, Dwayne D, MD;  Location: [facility] INVASIVE CV LAB;  Service: Cardiovascular;  Laterality: N/A;  . LOWER EXTREMITY ANGIOGRAPHY Right 01/11/2018   Procedure: LOWER EXTREMITY ANGIOGRAPHY;  Surgeon: Annice Needyew, Jason S, MD;  Location: [facility] INVASIVE CV LAB;  Service: Cardiovascular;  Laterality: Right;  . PSEUDOANERYSM COMPRESSION Left 01/20/2018   Procedure: PSEUDOANERYSM COMPRESSION;  Surgeon: Renford DillsSchnier, Gregory G, MD;  [facility] INVASIVE CV LAB;  Service: Cardiovascular;  Laterality: Left;  . TONSILLECTOMY     Family History  Problem Relation Age of Onset  . Breast cancer Mother 100  . AAA (abdominal aortic aneurysm) Father        ruptured  . Coronary artery disease Brother    No Known Allergies     Assessment & Plan:  The patient presents for a monthly lymphedema / unna boot therapy follow-up.  The patient has been treated with 3 layer zinc oxide Unna wraps for a few months in an attempt  to control her lower extremity edema / weeping.  During her last visit, I have applied for lymphedema pump.  The patient's request has been approved and she is now waiting for the machine to arrive at her house.  The patient presents today with her daughter.  The patient presents today without complaint.  The hematoma to her left lower extremity continues to heal.  The patient notes an improvement in the bilateral lower extremity edema she was experiencing.  The patient's skin is completely healed.  The patient denies any claudication-like symptoms, rest pain or ulcer formation to the bilateral lower extremity.  The patient denies any fever, nausea vomiting.  1. Lymphedema - Stable There has been a market improvement in the patient's bilateral lower extremity edema now that she has been treated with 3 layer zinc oxide Unna wraps for a few months. The patient has been approved for a lymphedema pump we are just waiting for it to arrive at her house in the mail. At this point, since the patient's edema is well controlled and her skin is healed I will transition her to medical grade 1 compression socks The patient was encouraged to wear graduated compression stockings (20-30 mmHg) on a daily basis. The patient was instructed to begin wearing the stockings first thing in the morning and removing them in the evening. The patient was instructed specifically not to sleep in the stockings. Prescription given. In addition, behavioral  modification including elevation during the day will be initiated. The patient should use her lymphedema pump at least once a day for an hour each time towards the evening hours  The patient was advised to follow up in six months to assess her progress.   Information on compression stockings, lymphedema and the lymphedema pump was given to the patient. The patient was instructed to call the office in the interim if any worsening edema or ulcerations to the legs, feet or toes occurs. The patient expresses their understanding.  2. Hematoma - Stable The patient's left thigh hematoma continues to improve in both size and degree of ecchymosis. Skin is intact and there is no cellulitis.  Current Outpatient Medications on File Prior to Visit  Medication Sig Dispense Refill  . acetaminophen (TYLENOL) 325 MG tablet Take 2 tablets (650 mg total) by mouth every 6 (six) hours as needed for mild pain (or Fever >/= 101). 30 tablet 2  . atorvastatin (LIPITOR) 80 MG tablet TAKE 1 TABLET (80 MG TOTAL) BY MOUTH DAILY AT 6 PM. 30 tablet 0  . clopidogrel (PLAVIX) 75 MG tablet Take 1 tablet (75 mg total) by mouth daily with breakfast. 30 tablet 3  . feeding supplement, ENSURE ENLIVE, (ENSURE ENLIVE) LIQD Take 237 mLs by mouth 2 (two) times daily between meals. 237 mL 12  . metoprolol tartrate (LOPRESSOR) 25 MG tablet TAKE 1/2 TABLET BY MOUTH TWICE DAILY. 90 tablet 0  . Multiple Vitamins-Minerals (PRESERVISION AREDS 2+MULTI VIT PO) Take 1 tablet by mouth 2 (two) times daily.    . Naphazoline-Pheniramine (OPCON-A OP) Place 1-2 drops into both eyes 3 (three) times daily as needed (for itchy/irritated eyes.).    Marland Kitchen pantoprazole (PROTONIX) 40 MG tablet TAKE 1 TABLET BY MOUTH EVERY DAY 30 tablet 0  . polyethylene glycol (MIRALAX / GLYCOLAX) packet Take 17 g by mouth daily. Hold if >2 bowel movements /day 14 each 0  . sertraline (ZOLOFT) 100 MG tablet TAKE ONE TABLET BY MOUTH EVERY DAY 90 tablet 0  . traMADol (ULTRAM) 50  MG  tablet Take 1 tablet (50 mg total) by mouth every 6 (six) hours as needed for moderate pain. (Patient not taking: Reported on 02/01/2018) 30 tablet 0  . traZODone (DESYREL) 50 MG tablet Take 0.5 tablets (25 mg total) by mouth at bedtime as needed for sleep. (Patient not taking: Reported on 02/01/2018) 30 tablet 0   No current facility-administered medications on file prior to visit.     There are no Patient Instructions on file for this visit. No follow-ups on file.   Moncerrath Berhe A Lonell Stamos, PA-C

## 2018-03-02 ENCOUNTER — Other Ambulatory Visit: Payer: Self-pay

## 2018-03-02 MED ORDER — ATORVASTATIN CALCIUM 80 MG PO TABS: 80.0000 mg | ORAL_TABLET | Freq: Every day | ORAL | 0 refills | Status: DC

## 2018-03-03 ENCOUNTER — Other Ambulatory Visit: Payer: Self-pay | Admitting: Internal Medicine

## 2018-03-03 DIAGNOSIS — I890 Lymphedema, not elsewhere classified: Secondary | ICD-10-CM | POA: Diagnosis not present

## 2018-03-20 ENCOUNTER — Other Ambulatory Visit: Payer: Self-pay | Admitting: Nurse Practitioner

## 2018-03-20 MED ORDER — SERTRALINE HCL 100 MG PO TABS: 100.0000 mg | ORAL_TABLET | Freq: Every day | ORAL | 0 refills | Status: DC

## 2018-03-20 MED ORDER — METOPROLOL TARTRATE 25 MG PO TABS: 12.5000 mg | ORAL_TABLET | Freq: Two times a day (BID) | ORAL | 0 refills | Status: DC

## 2018-05-11 DIAGNOSIS — Z7901 Long term (current) use of anticoagulants: Secondary | ICD-10-CM | POA: Diagnosis not present

## 2018-05-11 DIAGNOSIS — Z955 Presence of coronary angioplasty implant and graft: Secondary | ICD-10-CM | POA: Diagnosis not present

## 2018-05-11 DIAGNOSIS — I2510 Atherosclerotic heart disease of native coronary artery without angina pectoris: Secondary | ICD-10-CM | POA: Diagnosis not present

## 2018-05-11 DIAGNOSIS — R011 Cardiac murmur, unspecified: Secondary | ICD-10-CM | POA: Diagnosis not present

## 2018-05-11 DIAGNOSIS — I480 Paroxysmal atrial fibrillation: Secondary | ICD-10-CM | POA: Diagnosis not present

## 2018-05-11 DIAGNOSIS — R000 Tachycardia, unspecified: Secondary | ICD-10-CM | POA: Diagnosis not present

## 2018-05-11 DIAGNOSIS — I729 Aneurysm of unspecified site: Secondary | ICD-10-CM | POA: Diagnosis not present

## 2018-05-11 DIAGNOSIS — I214 Non-ST elevation (NSTEMI) myocardial infarction: Secondary | ICD-10-CM | POA: Diagnosis not present

## 2018-05-11 DIAGNOSIS — I209 Angina pectoris, unspecified: Secondary | ICD-10-CM | POA: Diagnosis not present

## 2018-05-11 DIAGNOSIS — I9789 Other postprocedural complications and disorders of the circulatory system, not elsewhere classified: Secondary | ICD-10-CM | POA: Diagnosis not present

## 2018-05-11 DIAGNOSIS — I10 Essential (primary) hypertension: Secondary | ICD-10-CM | POA: Diagnosis not present

## 2018-05-17 DIAGNOSIS — I480 Paroxysmal atrial fibrillation: Secondary | ICD-10-CM | POA: Diagnosis not present

## 2018-05-17 DIAGNOSIS — I10 Essential (primary) hypertension: Secondary | ICD-10-CM | POA: Diagnosis not present

## 2018-05-23 ENCOUNTER — Telehealth: Payer: Self-pay

## 2018-05-23 NOTE — Telephone Encounter (Signed)
CALLED PT AND REACHED DAUGHTER. NOTIFIED HER THAT MEDICAL RECORDS ARE READY TO BE PICKED UP.

## 2018-05-24 DIAGNOSIS — I10 Essential (primary) hypertension: Secondary | ICD-10-CM | POA: Diagnosis not present

## 2018-05-24 DIAGNOSIS — I480 Paroxysmal atrial fibrillation: Secondary | ICD-10-CM | POA: Diagnosis not present

## 2018-05-24 DIAGNOSIS — R300 Dysuria: Secondary | ICD-10-CM | POA: Diagnosis not present

## 2018-05-24 DIAGNOSIS — N390 Urinary tract infection, site not specified: Secondary | ICD-10-CM | POA: Diagnosis not present

## 2018-05-24 DIAGNOSIS — Z0000 Encounter for general adult medical examination without abnormal findings: Secondary | ICD-10-CM | POA: Diagnosis not present

## 2018-05-24 DIAGNOSIS — K219 Gastro-esophageal reflux disease without esophagitis: Secondary | ICD-10-CM | POA: Diagnosis not present

## 2018-05-24 DIAGNOSIS — F329 Major depressive disorder, single episode, unspecified: Secondary | ICD-10-CM | POA: Diagnosis not present

## 2018-05-24 DIAGNOSIS — I2510 Atherosclerotic heart disease of native coronary artery without angina pectoris: Secondary | ICD-10-CM | POA: Diagnosis not present

## 2018-05-25 DIAGNOSIS — H353132 Nonexudative age-related macular degeneration, bilateral, intermediate dry stage: Secondary | ICD-10-CM | POA: Diagnosis not present

## 2018-05-29 DIAGNOSIS — I480 Paroxysmal atrial fibrillation: Secondary | ICD-10-CM | POA: Diagnosis not present

## 2018-06-02 DIAGNOSIS — I480 Paroxysmal atrial fibrillation: Secondary | ICD-10-CM | POA: Diagnosis not present

## 2018-06-07 DIAGNOSIS — I480 Paroxysmal atrial fibrillation: Secondary | ICD-10-CM | POA: Diagnosis not present

## 2018-06-09 DIAGNOSIS — I480 Paroxysmal atrial fibrillation: Secondary | ICD-10-CM | POA: Diagnosis not present

## 2018-06-16 DIAGNOSIS — I480 Paroxysmal atrial fibrillation: Secondary | ICD-10-CM | POA: Diagnosis not present

## 2018-07-03 DIAGNOSIS — I480 Paroxysmal atrial fibrillation: Secondary | ICD-10-CM | POA: Diagnosis not present

## 2018-07-07 DIAGNOSIS — R5383 Other fatigue: Secondary | ICD-10-CM | POA: Diagnosis not present

## 2018-07-07 DIAGNOSIS — R8290 Unspecified abnormal findings in urine: Secondary | ICD-10-CM | POA: Diagnosis not present

## 2018-07-18 DIAGNOSIS — I480 Paroxysmal atrial fibrillation: Secondary | ICD-10-CM | POA: Diagnosis not present

## 2018-07-27 ENCOUNTER — Other Ambulatory Visit: Payer: Self-pay | Admitting: Nurse Practitioner

## 2018-08-07 DIAGNOSIS — I480 Paroxysmal atrial fibrillation: Secondary | ICD-10-CM | POA: Diagnosis not present

## 2018-08-07 DIAGNOSIS — S0033XA Contusion of nose, initial encounter: Secondary | ICD-10-CM | POA: Diagnosis not present

## 2018-08-07 DIAGNOSIS — R040 Epistaxis: Secondary | ICD-10-CM | POA: Diagnosis not present

## 2018-08-14 ENCOUNTER — Encounter: Payer: Self-pay | Admitting: Nurse Practitioner

## 2018-08-23 DIAGNOSIS — S5002XA Contusion of left elbow, initial encounter: Secondary | ICD-10-CM | POA: Diagnosis not present

## 2018-08-23 DIAGNOSIS — I480 Paroxysmal atrial fibrillation: Secondary | ICD-10-CM | POA: Diagnosis not present

## 2018-08-23 DIAGNOSIS — I2510 Atherosclerotic heart disease of native coronary artery without angina pectoris: Secondary | ICD-10-CM | POA: Diagnosis not present

## 2018-09-04 ENCOUNTER — Ambulatory Visit (INDEPENDENT_AMBULATORY_CARE_PROVIDER_SITE_OTHER): Payer: PPO | Admitting: Vascular Surgery

## 2018-09-04 ENCOUNTER — Encounter (INDEPENDENT_AMBULATORY_CARE_PROVIDER_SITE_OTHER): Payer: Self-pay | Admitting: Vascular Surgery

## 2018-09-04 VITALS — BP 129/75 | HR 55 | Resp 18 | Ht 66.0 in | Wt 196.8 lb

## 2018-09-04 DIAGNOSIS — I25110 Atherosclerotic heart disease of native coronary artery with unstable angina pectoris: Secondary | ICD-10-CM

## 2018-09-04 DIAGNOSIS — I890 Lymphedema, not elsewhere classified: Secondary | ICD-10-CM | POA: Diagnosis not present

## 2018-09-04 DIAGNOSIS — I4819 Other persistent atrial fibrillation: Secondary | ICD-10-CM | POA: Diagnosis not present

## 2018-09-04 DIAGNOSIS — I10 Essential (primary) hypertension: Secondary | ICD-10-CM

## 2018-09-04 NOTE — Progress Notes (Signed)
MRN : [id number]  Alison RakesKatherine L Russo is a 84 y.o. (10/08/1925) female who presents with chief complaint of  Chief Complaint  Patient presents with  . Follow-up  .  History of Present Illness:   The patient returns to the office for followup evaluation regarding leg swelling.  The swelling has persisted but with the lymph pump is much, much better controlled. The pain associated with swelling is essentially eliminated. There have not been any interval development of a ulcerations or wounds.  The patient denies problems with the pump, noting it is working well and the leggings are in good condition.  Since the previous visit the patient has been wearing graduated compression stockings and using the lymph pump on a routine basis and  has noted significant improvement in the lymphedema.   Patient stated the lymph pump has been a very positive factor in her care.       Current Meds  Medication Sig  . acetaminophen (TYLENOL) 325 MG tablet Take 2 tablets (650 mg total) by mouth every 6 (six) hours as needed for mild pain (or Fever >/= 101).  Marland Kitchen. atorvastatin (LIPITOR) 80 MG tablet TAKE 1 TABLET (80 MG TOTAL) BY MOUTH DAILY AT 6 PM.  . clopidogrel (PLAVIX) 75 MG tablet Take 1 tablet (75 mg total) by mouth daily with breakfast.  . feeding supplement, ENSURE ENLIVE, (ENSURE ENLIVE) LIQD Take 237 mLs by mouth 2 (two) times daily between meals.  . metoprolol tartrate (LOPRESSOR) 25 MG tablet Take 0.5 tablets (12.5 mg total) by mouth 2 (two) times daily.  . Multiple Vitamins-Minerals (PRESERVISION AREDS 2+MULTI VIT PO) Take 1 tablet by mouth 2 (two) times daily.  . Naphazoline-Pheniramine (OPCON-A OP) Place 1-2 drops into both eyes 3 (three) times daily as needed (for itchy/irritated eyes.).  Marland Kitchen. pantoprazole (PROTONIX) 40 MG tablet TAKE 1 TABLET BY MOUTH EVERY DAY  . polyethylene glycol (MIRALAX / GLYCOLAX) packet Take 17 g by mouth daily. Hold if >2 bowel movements /day  . sertraline (ZOLOFT) 100  MG tablet Take 1 tablet (100 mg total) by mouth daily.  . traMADol (ULTRAM) 50 MG tablet Take 1 tablet (50 mg total) by mouth every 6 (six) hours as needed for moderate pain.  . traZODone (DESYREL) 50 MG tablet Take 0.5 tablets (25 mg total) by mouth at bedtime as needed for sleep.    Past Medical History:  Diagnosis Date  . Anginal pain (HCC)   . CAD (coronary artery disease)   . Heart murmur   . Hypertension   . Mood disorder (HCC)    mostly anxiety  . NSTEMI (non-ST elevated myocardial infarction) (HCC) 12/22/2017  . OSA on CPAP   . PE (pulmonary embolism)    recurrent    Past Surgical History:  Procedure Laterality Date  . BREAST CYST ASPIRATION Right    neg  . CARDIAC CATHETERIZATION    . CATARACT EXTRACTION, BILATERAL    . COLONOSCOPY    . CORONARY STENT INTERVENTION N/A 12/23/2017   Procedure: CORONARY STENT INTERVENTION;  Surgeon: SwazilandJordan, Peter M, MD;  [facility];  Service: Cardiovascular;  Laterality: N/A;  . EYE SURGERY Left    "blood vessel busted behind my eye"  . EYE SURGERY Right    ?vitrectomy  . HYSTEROSCOPY W/D&C N/A 10/25/2016   Procedure: DILATATION AND CURETTAGE /HYSTEROSCOPY, POLYPECTOMY;  Surgeon: Christeen DouglasBethany Beasley, MD;  Location: [facility] ORS;  Service: Gynecology;  Laterality: N/A;  . INGUINAL HERNIA REPAIR Right 1950s  . LEFT  HEART CATH AND CORONARY ANGIOGRAPHY N/A 12/21/2017   Procedure: LEFT HEART CATH AND CORONARY ANGIOGRAPHY possible PCI and stent;  Surgeon: Alwyn Pea, MD;  Location: [facility] INVASIVE CV LAB;  Service: Cardiovascular;  Laterality: N/A;  . LOWER EXTREMITY ANGIOGRAPHY Right 01/11/2018   Procedure: LOWER EXTREMITY ANGIOGRAPHY;  Surgeon: Annice Needy, MD;  Location: [facility] INVASIVE CV LAB;  Service: Cardiovascular;  Laterality: Right;  . PSEUDOANERYSM COMPRESSION Left 01/20/2018   Procedure: PSEUDOANERYSM COMPRESSION;  Surgeon: Renford Dills, MD;  Location: [facility] INVASIVE CV LAB;  Service: Cardiovascular;  Laterality: Left;    . TONSILLECTOMY      Social History Social History   Tobacco Use  . Smoking status: Never Smoker  . Smokeless tobacco: Never Used  Substance Use Topics  . Alcohol use: Never    Frequency: Never  . Drug use: Never    Family History Family History  Problem Relation Age of Onset  . Breast cancer Mother 100  . AAA (abdominal aortic aneurysm) Father        ruptured  . Coronary artery disease Brother     No Known Allergies   REVIEW OF SYSTEMS (Negative unless checked)  Constitutional: [] Weight loss  [] Fever  [] Chills Cardiac: [] Chest pain   [] Chest pressure   [] Palpitations   [] Shortness of breath when laying flat   [] Shortness of breath with exertion. Vascular:  [] Pain in legs with walking   [] Pain in legs at rest  [] History of DVT   [] Phlebitis   [x] Swelling in legs   [] Varicose veins   [] Non-healing ulcers Pulmonary:   [] Uses home oxygen   [] Productive cough   [] Hemoptysis   [] Wheeze  [] COPD   [] Asthma Neurologic:  [] Dizziness   [] Seizures   [] History of stroke   [] History of TIA  [] Aphasia   [] Vissual changes   [] Weakness or numbness in arm   [] Weakness or numbness in leg Musculoskeletal:   [] Joint swelling   [] Joint pain   [] Low back pain Hematologic:  [] Easy bruising  [] Easy bleeding   [] Hypercoagulable state   [] Anemic Gastrointestinal:  [] Diarrhea   [] Vomiting  [] Gastroesophageal reflux/heartburn   [] Difficulty swallowing. Genitourinary:  [] Chronic kidney disease   [] Difficult urination  [] Frequent urination   [] Blood in urine Skin:  [] Rashes   [] Ulcers  Psychological:  [] History of anxiety   []  History of major depression.  Physical Examination  Vitals:   09/04/18 1013  BP: 129/75  Pulse: (!) 55  Resp: 18  Weight: 196 lb 12.8 oz (89.3 kg)  Height: 5\' 6"  (1.676 m)   Body mass index is 31.76 kg/m. Gen: WD/WN, NAD Head: [place]/AT, No temporalis wasting.  Ear/Nose/Throat: Hearing grossly intact, nares w/o erythema or drainage Eyes: PER, EOMI, sclera nonicteric.   Neck: Supple, no large masses.   Pulmonary:  Good air movement, no audible wheezing bilaterally, no use of accessory muscles.  Cardiac: RRR, no JVD Vascular: scattered small varicosities present bilaterally.  Mild venous stasis changes to the legs bilaterally.  3+ soft pitting edema left greater than right Vessel Right Left  Radial Palpable Palpable  PT Palpable Palpable  DP Palpable Palpable  Gastrointestinal: Non-distended. No guarding/no peritoneal signs.  Musculoskeletal: M/S 5/5 throughout.  No deformity or atrophy.  Neurologic: CN 2-12 intact. Symmetrical.  Speech is fluent. Motor exam as listed above. Psychiatric: Judgment intact, Mood & affect appropriate for pt's clinical situation. Dermatologic: Venous rashes no ulcers noted.  No changes consistent with cellulitis. Lymph : No lichenification or skin changes of chronic lymphedema.  CBC Lab Results  Component Value Date   WBC 8.5 01/23/2018   HGB 8.4 (L) 01/23/2018   HCT 24.8 (L) 01/23/2018   MCV 84.5 01/23/2018   PLT 212 01/23/2018    BMET    Component Value Date/Time   NA 139 01/20/2018 0441   NA 143 09/28/2017 1259   K 3.5 01/20/2018 0441   CL 109 01/20/2018 0441   CO2 26 01/20/2018 0441   GLUCOSE 117 (H) 01/20/2018 0441   BUN 14 01/20/2018 0441   BUN 16 09/28/2017 1259   CREATININE 0.64 01/20/2018 0441   CALCIUM 8.1 (L) 01/20/2018 0441   GFRNONAA >60 01/20/2018 0441   GFRAA >60 01/20/2018 0441   CrCl cannot be calculated (Patient's most recent lab result is older than the maximum 21 days allowed.).  COAG Lab Results  Component Value Date   INR 1.29 01/20/2018   INR 2.64 01/19/2018   INR 5.20 (HH) 01/18/2018    Radiology No results found.   Assessment/Plan 1. Lymphedema  No surgery or intervention at this point in time.    I have reviewed my discussion with the patient regarding lymphedema and why it  causes symptoms.  Patient will continue wearing graduated compression stockings class 1 (20-30  mmHg) on a daily basis a prescription was given. The patient is reminded to put the stockings on first thing in the morning and removing them in the evening. The patient is instructed specifically not to sleep in the stockings.   In addition, behavioral modification throughout the day will be continued.  This will include frequent elevation (such as in a recliner), use of over the counter pain medications as needed and exercise such as walking.  I have reviewed systemic causes for chronic edema such as liver, kidney and cardiac etiologies and there does not appear to be any significant changes in these organ systems over the past year.  The patient is under the impression that these organ systems are all stable and unchanged.    The patient will continue aggressive use of the  lymph pump.  This will continue to improve the edema control and prevent sequela such as ulcers and infections.   The patient will follow-up with me on an annual basis.    2. Coronary artery disease involving native coronary artery of native heart with unstable angina pectoris (HCC) Continue cardiac and antihypertensive medications as already ordered and reviewed, no changes at this time.  Continue statin as ordered and reviewed, no changes at this time  Nitrates PRN for chest pain   3. Essential hypertension Continue antihypertensive medications as already ordered, these medications have been reviewed and there are no changes at this time.   4. Other persistent atrial fibrillation Continue antiarrhythmia medications as already ordered, these medications have been reviewed and there are no changes at this time.  Continue anticoagulation as ordered by Cardiology Service    Levora Dredge, MD  09/04/2018 10:24 AM

## 2018-09-29 ENCOUNTER — Other Ambulatory Visit: Payer: Self-pay | Admitting: Internal Medicine

## 2018-11-09 DIAGNOSIS — I10 Essential (primary) hypertension: Secondary | ICD-10-CM | POA: Diagnosis not present

## 2018-11-09 DIAGNOSIS — I209 Angina pectoris, unspecified: Secondary | ICD-10-CM | POA: Diagnosis not present

## 2018-11-09 DIAGNOSIS — R011 Cardiac murmur, unspecified: Secondary | ICD-10-CM | POA: Diagnosis not present

## 2018-11-09 DIAGNOSIS — T81718A Complication of other artery following a procedure, not elsewhere classified, initial encounter: Secondary | ICD-10-CM | POA: Diagnosis not present

## 2018-11-09 DIAGNOSIS — R000 Tachycardia, unspecified: Secondary | ICD-10-CM | POA: Diagnosis not present

## 2018-11-09 DIAGNOSIS — I480 Paroxysmal atrial fibrillation: Secondary | ICD-10-CM | POA: Diagnosis not present

## 2018-11-09 DIAGNOSIS — I214 Non-ST elevation (NSTEMI) myocardial infarction: Secondary | ICD-10-CM | POA: Diagnosis not present

## 2018-11-09 DIAGNOSIS — I729 Aneurysm of unspecified site: Secondary | ICD-10-CM | POA: Diagnosis not present

## 2018-11-09 DIAGNOSIS — Z7901 Long term (current) use of anticoagulants: Secondary | ICD-10-CM | POA: Diagnosis not present

## 2018-11-09 DIAGNOSIS — I2510 Atherosclerotic heart disease of native coronary artery without angina pectoris: Secondary | ICD-10-CM | POA: Diagnosis not present

## 2018-11-09 DIAGNOSIS — Z955 Presence of coronary angioplasty implant and graft: Secondary | ICD-10-CM | POA: Diagnosis not present

## 2018-11-29 DIAGNOSIS — F329 Major depressive disorder, single episode, unspecified: Secondary | ICD-10-CM | POA: Diagnosis not present

## 2018-11-29 DIAGNOSIS — I2510 Atherosclerotic heart disease of native coronary artery without angina pectoris: Secondary | ICD-10-CM | POA: Diagnosis not present

## 2018-11-29 DIAGNOSIS — I480 Paroxysmal atrial fibrillation: Secondary | ICD-10-CM | POA: Diagnosis not present

## 2018-11-29 DIAGNOSIS — Z0000 Encounter for general adult medical examination without abnormal findings: Secondary | ICD-10-CM | POA: Diagnosis not present

## 2018-11-29 DIAGNOSIS — G4730 Sleep apnea, unspecified: Secondary | ICD-10-CM | POA: Insufficient documentation

## 2018-11-29 DIAGNOSIS — I10 Essential (primary) hypertension: Secondary | ICD-10-CM | POA: Diagnosis not present

## 2019-01-30 DIAGNOSIS — G4733 Obstructive sleep apnea (adult) (pediatric): Secondary | ICD-10-CM | POA: Diagnosis not present

## 2019-02-03 DIAGNOSIS — G4733 Obstructive sleep apnea (adult) (pediatric): Secondary | ICD-10-CM | POA: Diagnosis not present

## 2019-03-02 DIAGNOSIS — H353132 Nonexudative age-related macular degeneration, bilateral, intermediate dry stage: Secondary | ICD-10-CM | POA: Diagnosis not present

## 2019-03-08 DIAGNOSIS — K219 Gastro-esophageal reflux disease without esophagitis: Secondary | ICD-10-CM | POA: Diagnosis not present

## 2019-03-08 DIAGNOSIS — I10 Essential (primary) hypertension: Secondary | ICD-10-CM | POA: Diagnosis not present

## 2019-03-08 DIAGNOSIS — I480 Paroxysmal atrial fibrillation: Secondary | ICD-10-CM | POA: Diagnosis not present

## 2019-03-08 DIAGNOSIS — G4730 Sleep apnea, unspecified: Secondary | ICD-10-CM | POA: Diagnosis not present

## 2019-03-08 DIAGNOSIS — I2510 Atherosclerotic heart disease of native coronary artery without angina pectoris: Secondary | ICD-10-CM | POA: Diagnosis not present

## 2019-04-08 IMAGING — CT CT CTA ABD/PEL W/CM AND/OR W/O CM
3 of 9 series · 11 of 46 positions shown, 17 images · IV contrast (iopamidol)
Comparison: 12/24/2017

CLINICAL DATA: Severe left leg pain and swelling. Cardiac
catheterization feel left groin 1 month ago.

EXAM:
CTA ABDOMEN AND PELVIS wITHOUT AND WITH CONTRAST
TECHNIQUE: Multidetector CT imaging of the abdomen and pelvis was performed
using the standard protocol during bolus administration of
intravenous contrast. Multiplanar reconstructed images and MIPs were
obtained and reviewed to evaluate the vascular anatomy.
CONTRAST:  100mL 6RZTB9-OSH IOPAMIDOL (6RZTB9-OSH) INJECTION 76%

[Series 4: axial arterial · axial · arterial · 0.92mm/px · z∈[-471,-355]mm · 3 of 264 slices shown]
[im 30/264  soft-tissue]
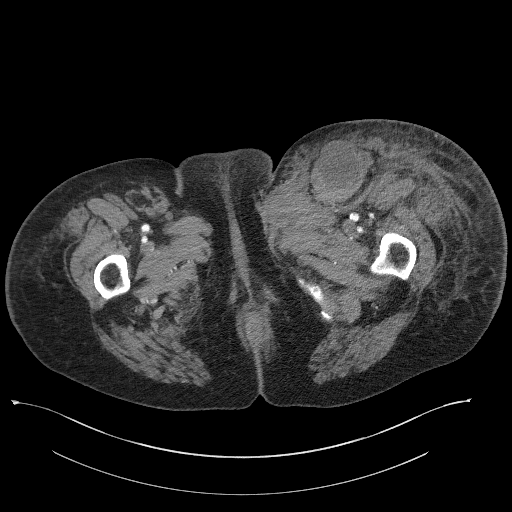
[im 59/264  soft-tissue]
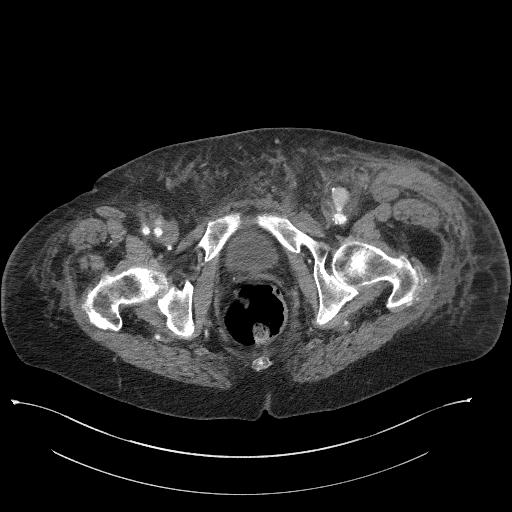
[im 88/264  soft-tissue]
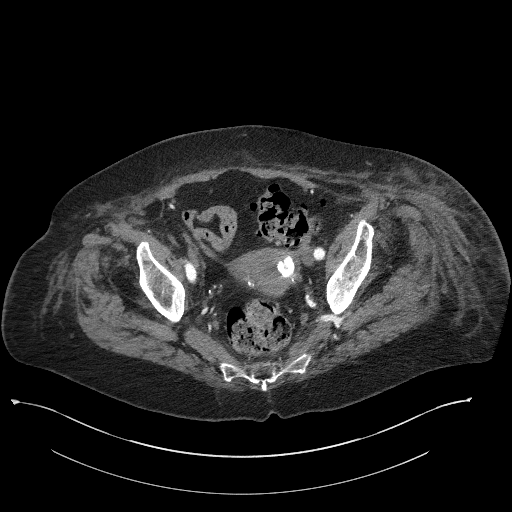

[Series 5: axial venous · axial · portal-venous · 0.92mm/px · z∈[-453,-78]mm · 6 of 106 slices shown, 11 images]
[im 16/106  soft-tissue]
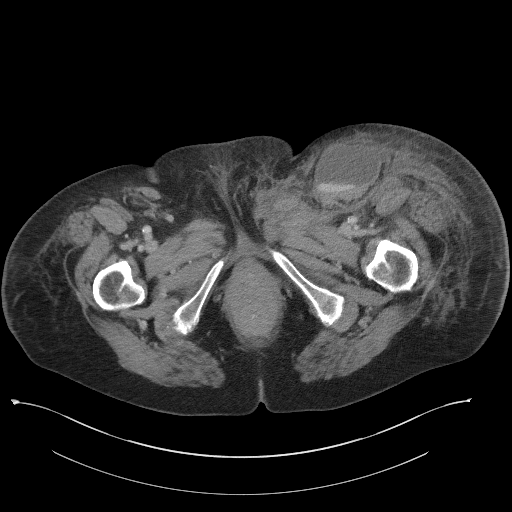
[im 16/106  bone]
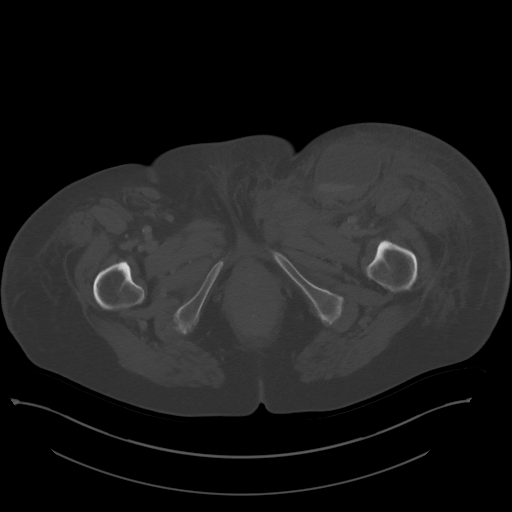
[im 31/106  soft-tissue]
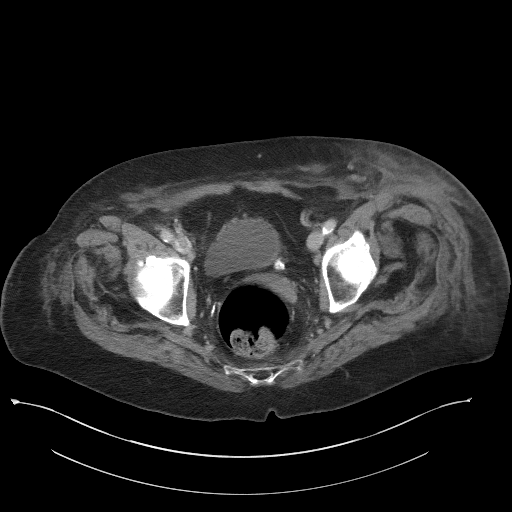
[im 46/106  soft-tissue]
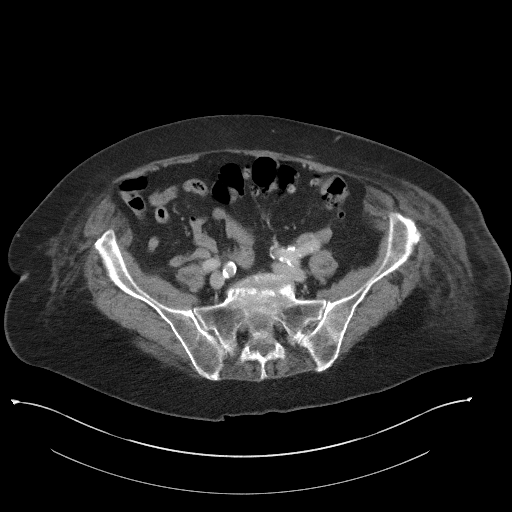
[im 46/106  lung]
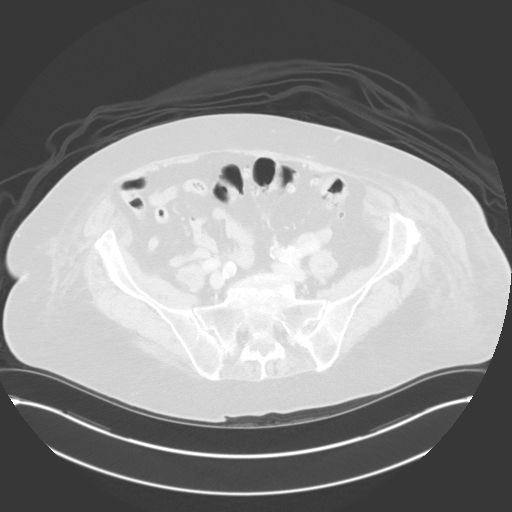
[im 61/106  soft-tissue]
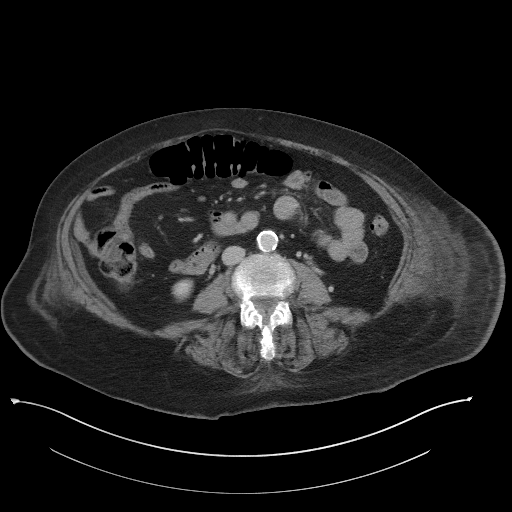
[im 61/106  lung]
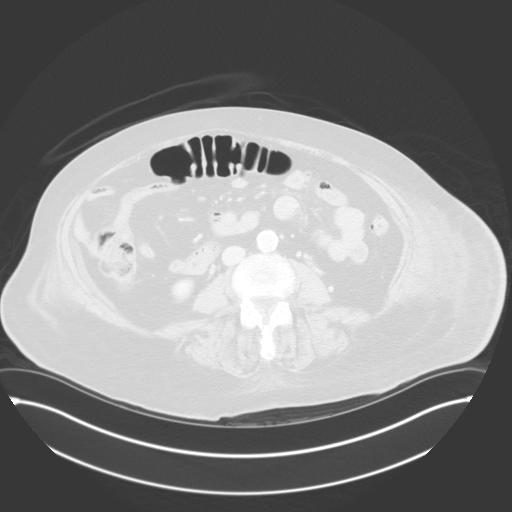
[im 76/106  soft-tissue]
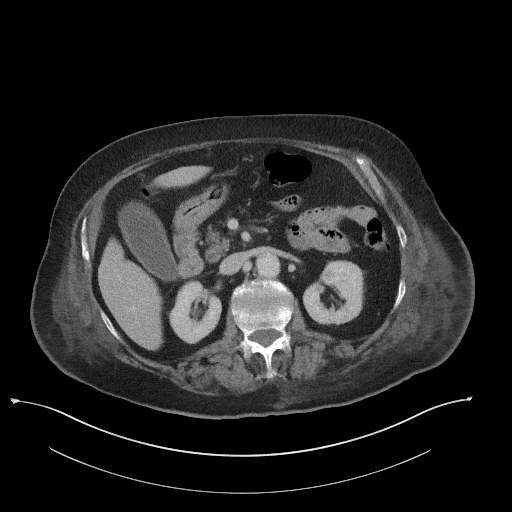
[im 76/106  lung]
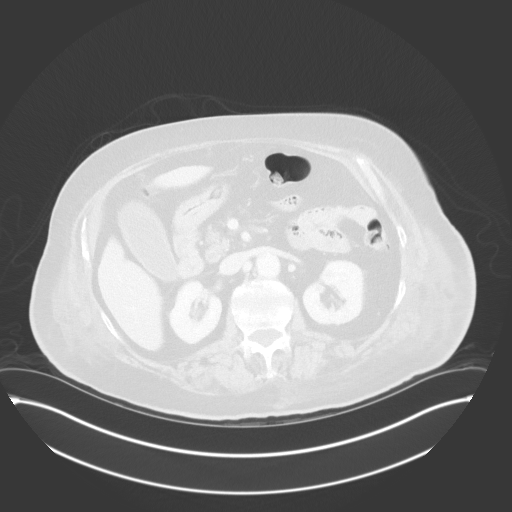
[im 91/106  soft-tissue]
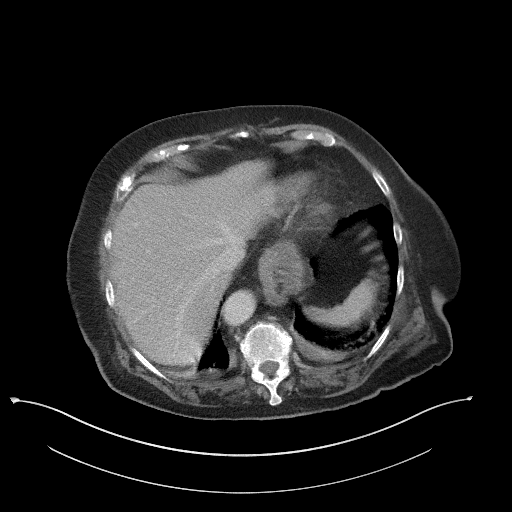
[im 91/106  lung]
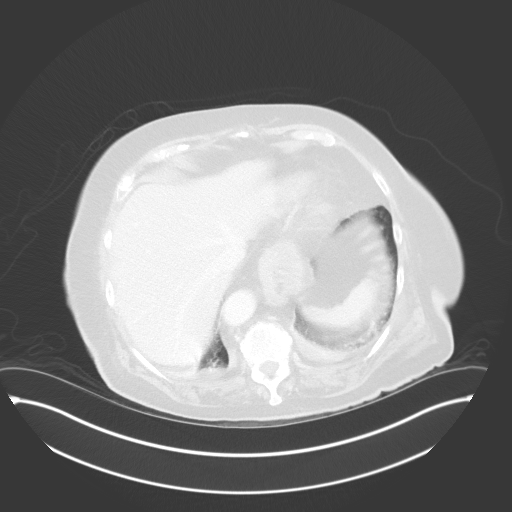

[Series 13: coronal venous mpr · coronal · portal-venous · 0.94mm/px · 2 of 135 slices shown, 3 images]
[im 45/135  soft-tissue]
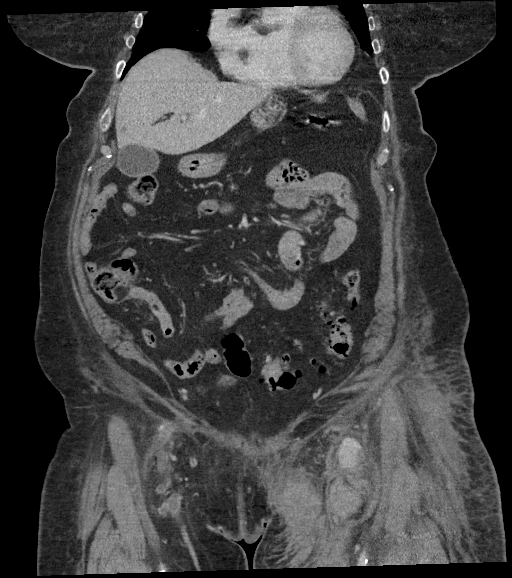
[im 45/135  bone]
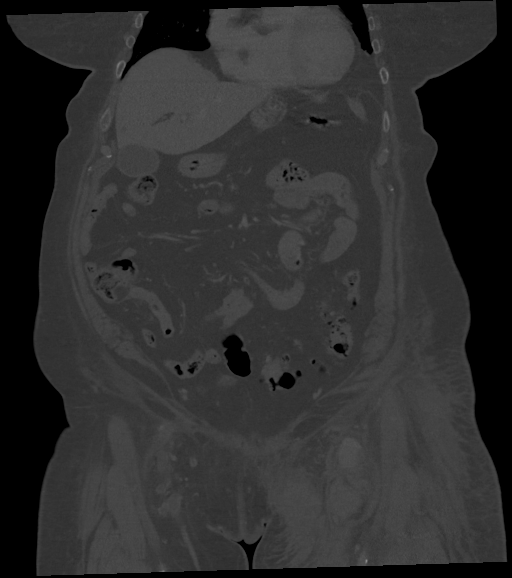
[im 90/135  soft-tissue]
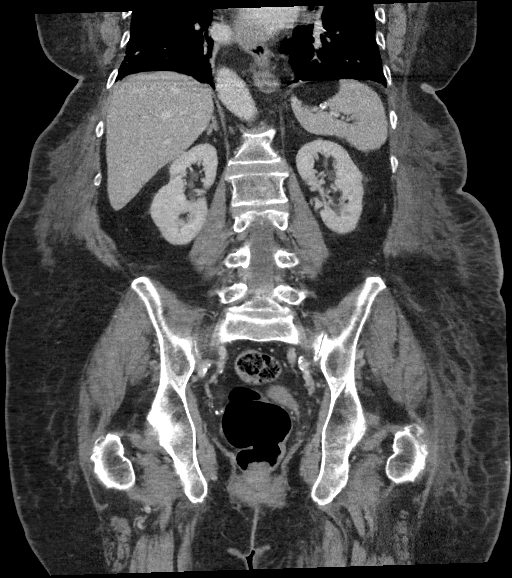

[11 of 46 positions shown; findings below may reference images not displayed]

FINDINGS: VASCULAR

Aorta: Nonaneurysmal and patent. Diffuse atherosclerotic
calcifications.

Celiac: Mild narrowing at the origin secondary to median arcuate
ligament syndrome and atherosclerotic calcified plaque. Branch
vessels grossly patent.

SMA: Atherosclerotic calcifications at the origin.  Patent.

Renals: Single renal arteries are patent with some atherosclerotic
changes at their origins.

IMA: Patent.

Inflow: There are atherosclerotic calcifications throughout the
bilateral common and external iliac arteries without significant
narrowing or aneurysmal dilatation. Internal iliac arteries are also
patent with diffuse atherosclerotic calcifications.

Proximal Outflow: There is a large pseudoaneurysm emanating from the
left common femoral artery. It measures up to 5.8 cm in size.
Contrast fills the pseudoaneurysm with increasing contrast
opacification on delayed phase images. The bilateral common femoral
arteries are patent. Visualized superficial femoral arteries are
patent. Stranding in the right inguinal region consistent with the
prior visualized hematoma is improved.

Veins: No evidence of DVT. There is narrowing of the left common
femoral vein due to the pseudoaneurysm.

Review of the MIP images confirms the above findings.

NON-VASCULAR

Lower chest: Dependent atelectasis.  Small pleural effusions.

Hepatobiliary: Tiny hypodensity in the medial segment of the left
lobe adjacent to the gallbladder is stable in retrospect compared
with 05/25/2007. Gallbladder is within normal limits.

Pancreas: Complex cystic mass emanating from the head of the
pancreas measures 1.7 cm on image 26 of series 5. The remainder of
the pancreas is atrophic. There may be a second cystic mass more
inferiorly emanating from the head of the pancreas on image 91 of
series 4.

Spleen: Unremarkable

Adrenals/Urinary Tract: There is prominence of the adrenal glands in
a hyperplasia pattern. Scattered minimal scarring in the kidneys.
Bladder diverticulum is noted to the left. Bladder is relatively
decompressed.

Stomach/Bowel: Moderate hiatal hernia. No obvious mass in the colon.
No evidence of small-bowel obstruction.

Lymphatic: No abnormal retroperitoneal adenopathy.

Reproductive: Calcified fibroids.

Other: There is stranding within the fat surrounding the left
inguinal pseudoaneurysm. There is thickening of the musculature.
These findings are all consistent with soft tissue hemorrhage.
Hemorrhage within the right inguinal region has improved.

Musculoskeletal: No vertebral compression deformity. Degenerative
changes of the facet joints in the lumbar region are noted.
IMPRESSION: VASCULAR

Large 5.8 cm left inguinal pseudoaneurysm likely related to the
recent cardiac catheterization.

Critical Value/emergent results were called by telephone at the time
of interpretation on 01/19/2018 at [DATE] to Dr. Chocola Yuri, RN,
who verbally acknowledged these results.

NON-VASCULAR

There is hemorrhage in the surrounding tissues of the left thigh and
inguinal region.

Right inguinal hematoma has improved.

New cystic mass in the head of the pancreas. There is possibly a
second lesion. Malignancy is not excluded. Pancreas MRI protocol is
recommended.

Stable tiny hypodensity in the liver supporting benign etiology.

## 2019-06-12 DIAGNOSIS — I10 Essential (primary) hypertension: Secondary | ICD-10-CM | POA: Diagnosis not present

## 2019-06-12 DIAGNOSIS — F329 Major depressive disorder, single episode, unspecified: Secondary | ICD-10-CM | POA: Diagnosis not present

## 2019-06-12 DIAGNOSIS — I2510 Atherosclerotic heart disease of native coronary artery without angina pectoris: Secondary | ICD-10-CM | POA: Diagnosis not present

## 2019-06-12 DIAGNOSIS — I480 Paroxysmal atrial fibrillation: Secondary | ICD-10-CM | POA: Diagnosis not present

## 2019-06-12 DIAGNOSIS — Z13220 Encounter for screening for lipoid disorders: Secondary | ICD-10-CM | POA: Diagnosis not present

## 2019-06-15 DIAGNOSIS — G4733 Obstructive sleep apnea (adult) (pediatric): Secondary | ICD-10-CM | POA: Diagnosis not present

## 2019-07-15 DIAGNOSIS — G4733 Obstructive sleep apnea (adult) (pediatric): Secondary | ICD-10-CM | POA: Diagnosis not present

## 2019-08-15 DIAGNOSIS — G4733 Obstructive sleep apnea (adult) (pediatric): Secondary | ICD-10-CM | POA: Diagnosis not present

## 2019-08-20 DIAGNOSIS — R399 Unspecified symptoms and signs involving the genitourinary system: Secondary | ICD-10-CM | POA: Diagnosis not present

## 2019-09-06 ENCOUNTER — Ambulatory Visit (INDEPENDENT_AMBULATORY_CARE_PROVIDER_SITE_OTHER): Payer: PPO | Admitting: Nurse Practitioner

## 2019-09-06 ENCOUNTER — Other Ambulatory Visit: Payer: Self-pay

## 2019-09-06 ENCOUNTER — Encounter (INDEPENDENT_AMBULATORY_CARE_PROVIDER_SITE_OTHER): Payer: Self-pay | Admitting: Nurse Practitioner

## 2019-09-06 VITALS — BP 156/89 | HR 69 | Resp 16 | Wt 196.4 lb

## 2019-09-06 DIAGNOSIS — I10 Essential (primary) hypertension: Secondary | ICD-10-CM

## 2019-09-06 DIAGNOSIS — I890 Lymphedema, not elsewhere classified: Secondary | ICD-10-CM | POA: Diagnosis not present

## 2019-09-10 ENCOUNTER — Encounter (INDEPENDENT_AMBULATORY_CARE_PROVIDER_SITE_OTHER): Payer: Self-pay | Admitting: Nurse Practitioner

## 2019-09-10 NOTE — Progress Notes (Signed)
SUBJECTIVE:  Patient ID: [name], female    DOB: [birth year], 85 y.o.   MRN: [id number] Chief Complaint  Patient presents with  . Follow-up    62yr follow up    HPI  KARRYN KOSINSKI is a 85 y.o. female  The patient returns to the office for followup evaluation regarding leg swelling.  The swelling has persisted but with the lymph pump the patient states the swelling is much better controlled. The pain associated with swelling is essentially eliminated. There have not been any interval development of a ulcerations or wounds.  No episodes of cellulitis or infection over the past 12 months  The patient denies problems with the pump, noting it is working well and the leggings are in good condition.  Since the previous visit the patient has been wearing graduated compression stockings and using the lymph pump on a routine basis and  has noted significant improvement in the lymphedema.   Patient stated the lymph pump has been a very positive factor in her care.      Past Medical History:  Diagnosis Date  . Anginal pain (HCC)   . CAD (coronary artery disease)   . Heart murmur   . Hypertension   . Mood disorder (HCC)    mostly anxiety  . NSTEMI (non-ST elevated myocardial infarction) (HCC) 12/22/2017  . OSA on CPAP   . PE (pulmonary embolism)    recurrent    Past Surgical History:  Procedure Laterality Date  . BREAST CYST ASPIRATION Right    neg  . CARDIAC CATHETERIZATION    . CATARACT EXTRACTION, BILATERAL    . COLONOSCOPY    . CORONARY STENT INTERVENTION N/A 12/23/2017   Procedure: CORONARY STENT INTERVENTION;  Surgeon: Swazil[place], Peter M, MD;  Location: [facility] INVASIVE CV LAB;  Service: Cardiovascular;  Laterality: N/A;  . EYE SURGERY Left    "blood vessel busted behind my eye"  . EYE SURGERY Right    ?vitrectomy  . HYSTEROSCOPY WITH D & C N/A 10/25/2016   Procedure: DILATATION AND CURETTAGE /HYSTEROSCOPY, POLYPECTOMY;  Surgeon: Christeen Douglas, MD;  Location: [facility] ORS;   Service: Gynecology;  Laterality: N/A;  . INGUINAL HERNIA REPAIR Right 1950s  . LEFT HEART CATH AND CORONARY ANGIOGRAPHY N/A 12/21/2017   Procedure: LEFT HEART CATH AND CORONARY ANGIOGRAPHY possible PCI and stent;  Surgeon: Alwyn Pea, MD;  Location: [facility] INVASIVE CV LAB;  Service: Cardiovascular;  Laterality: N/A;  . LOWER EXTREMITY ANGIOGRAPHY Right 01/11/2018   Procedure: LOWER EXTREMITY ANGIOGRAPHY;  Surgeon: Annice Needy, MD;  Location: [facility] INVASIVE CV LAB;  Service: Cardiovascular;  Laterality: Right;  . PSEUDOANERYSM COMPRESSION Left 01/20/2018   Procedure: PSEUDOANERYSM COMPRESSION;  Surgeon: Renford Dills, MD;  Location: [facility] INVASIVE CV LAB;  Service: Cardiovascular;  Laterality: Left;  . TONSILLECTOMY      Social History   Socioeconomic History  . Marital status: Widowed    Spouse name: Not on file  . Number of children: 2  . Years of education: Not on file  . Highest education level: Not on file  Occupational History  . Occupation: Office work  . Occupation: Custodian    Comment: [employer]  Tobacco Use  . Smoking status: Never Smoker  . Smokeless tobacco: Never Used  Substance and Sexual Activity  . Alcohol use: Never  . Drug use: Never  . Sexual activity: Not Currently  Other Topics Concern  . Not on file  Social History Narrative   Has living  will   Daughter is health care POA   Would accept resuscitation attempts   No tube feeds if cognitively unaware   Social Determinants of Health   Financial Resource Strain:   . Difficulty of Paying Living Expenses: Not on file  Food Insecurity:   . Worried About Programme researcher, broadcasting/film/video in the Last Year: Not on file  . Ran Out of Food in the Last Year: Not on file  Transportation Needs:   . Lack of Transportation (Medical): Not on file  . Lack of Transportation (Non-Medical): Not on file  Physical Activity:   . Days of Exercise per Week: Not on file  . Minutes of Exercise per Session: Not on file    Stress:   . Feeling of Stress : Not on file  Social Connections:   . Frequency of Communication with Friends and Family: Not on file  . Frequency of Social Gatherings with Friends and Family: Not on file  . Attends Religious Services: Not on file  . Active Member of Clubs or Organizations: Not on file  . Attends Banker Meetings: Not on file  . Marital Status: Not on file  Intimate Partner Violence:   . Fear of Current or Ex-Partner: Not on file  . Emotionally Abused: Not on file  . Physically Abused: Not on file  . Sexually Abused: Not on file    Family History  Problem Relation Age of Onset  . Breast cancer Mother 100  . AAA (abdominal aortic aneurysm) Father        ruptured  . Coronary artery disease Brother     No Known Allergies   Review of Systems   Review of Systems: Negative Unless Checked Constitutional: Weight loss  Fever  Chills Cardiac: Chest pain    Atrial Fibrillation  Palpitations   Shortness of breath when laying flat   Shortness of breath with exertion. Shortness of breath at rest Vascular:  Pain in legs with walking   Pain in legs with standing Pain in legs when laying flat   Claudication    Pain in feet when laying flat    History of DVT   Phlebitis   Swelling in legs   Varicose veins   Non-healing ulcers Pulmonary:   Uses home oxygen   Productive cough   Hemoptysis   Wheeze  COPD   Asthma Neurologic:  Dizziness   Seizures  Blackouts History of stroke   History of TIA  Aphasia   Temporary Blindness   Weakness or numbness in arm   Weakness or numbness in leg Musculoskeletal:   Joint swelling   Joint pain   Low back pain   History of Knee Replacement Arthritis back Surgeries   Spinal Stenosis    Hematologic:  Easy bruising  Easy bleeding   Hypercoagulable state   Anemic Gastrointestinal:  Diarrhea   Vomiting  Gastroesophageal reflux/heartburn    Difficulty swallowing. Abdominal pain Genitourinary:  Chronic kidney disease   Difficult urination  Anuric   Blood in urine Frequent urination  Burning with urination   Hematuria Skin:  Rashes   Ulcers Wounds Psychological:  History of anxiety    History of major depression   Memory Difficulties      OBJECTIVE:   Physical Exam  BP (!) 156/89 (BP Location: Left Arm)   Pulse 69   Resp 16   Wt 196 lb 6.4 oz (89.1 kg)   BMI 31.70 kg/m   Gen: WD/WN, NAD Head: Melbeta/AT, No temporalis  wasting.  Ear/Nose/Throat: Hearing grossly intact, nares w/o erythema or drainage Eyes: PER, EOMI, sclera nonicteric.  Neck: Supple, no masses.  No JVD.  Pulmonary:  Good air movement, no use of accessory muscles.  Cardiac: RRR Vascular: 2+ soft edema bilaterally, mild stasis dermatitis  Vessel Right Left  Radial Palpable Palpable   Gastrointestinal: soft, non-distended. No guarding/no peritoneal signs.  Musculoskeletal: M/S 5/5 throughout.  No deformity or atrophy.  Neurologic: Pain and light touch intact in extremities.  Symmetrical.  Speech is fluent. Motor exam as listed above. Psychiatric: Judgment intact, Mood & affect appropriate for pt's clinical situation. Dermatologic: No Ulcers Noted.  No changes consistent with cellulitis. Lymph : No Cervical lymphadenopathy, no lichenification or skin changes of chronic lymphedema.       ASSESSMENT AND PLAN:  1. Lymphedema  No surgery or intervention at this point in time.    I have reviewed my discussion with the patient regarding lymphedema and why it  causes symptoms.  Patient will continue wearing graduated compression stockings class 1 (20-30 mmHg) on a daily basis a prescription was given. The patient is reminded to put the stockings on first thing in the morning and removing them in the evening. The patient is instructed specifically not to sleep in the stockings.   In addition, behavioral modification throughout the  day will be continued.  This will include frequent elevation (such as in a recliner), use of over the counter pain medications as needed and exercise such as walking.  I have reviewed systemic causes for chronic edema such as liver, kidney and cardiac etiologies and there does not appear to be any significant changes in these organ systems over the past year.  The patient is under the impression that these organ systems are all stable and unchanged.    The patient will continue aggressive use of the  lymph pump.  This will continue to improve the edema control and prevent sequela such as ulcers and infections.   Per patient preference, she will follow-up on as needed basis.   2. Essential hypertension BP acceptable today. Continue antihypertensive medications as already ordered, these medications have been reviewed and there are no changes at this time.    Current Outpatient Medications on File Prior to Visit  Medication Sig Dispense Refill  . acetaminophen (TYLENOL) 325 MG tablet Take 2 tablets (650 mg total) by mouth every 6 (six) hours as needed for mild pain (or Fever >/= 101). 30 tablet 2  . atorvastatin (LIPITOR) 80 MG tablet TAKE 1 TABLET (80 MG TOTAL) BY MOUTH DAILY AT 6 PM. 90 tablet 1  . clopidogrel (PLAVIX) 75 MG tablet Take 1 tablet (75 mg total) by mouth daily with breakfast. 30 tablet 3  . feeding supplement, ENSURE ENLIVE, (ENSURE ENLIVE) LIQD Take 237 mLs by mouth 2 (two) times daily between meals. 237 mL 12  . metoprolol tartrate (LOPRESSOR) 25 MG tablet Take 0.5 tablets (12.5 mg total) by mouth 2 (two) times daily. 90 tablet 0  . Multiple Vitamins-Minerals (PRESERVISION AREDS 2+MULTI VIT PO) Take 1 tablet by mouth 2 (two) times daily.    . Naphazoline-Pheniramine (OPCON-A OP) Place 1-2 drops into both eyes 3 (three) times daily as needed (for itchy/irritated eyes.).    Marland Kitchen pantoprazole (PROTONIX) 40 MG tablet TAKE 1 TABLET BY MOUTH EVERY DAY 30 tablet 0  . polyethylene glycol  (MIRALAX / GLYCOLAX) packet Take 17 g by mouth daily. Hold if >2 bowel movements /day 14 each 0  . sertraline (ZOLOFT)  100 MG tablet Take 1 tablet (100 mg total) by mouth daily. 90 tablet 0  . traZODone (DESYREL) 50 MG tablet Take 0.5 tablets (25 mg total) by mouth at bedtime as needed for sleep. 30 tablet 0  . traMADol (ULTRAM) 50 MG tablet Take 1 tablet (50 mg total) by mouth every 6 (six) hours as needed for moderate pain. (Patient not taking: Reported on 09/06/2019) 30 tablet 0   No current facility-administered medications on file prior to visit.    Patient Instructions  Lymphedema  Lymphedema is swelling that is caused by the abnormal collection of lymph in the tissues under the skin. Lymph is fluid from the tissues in your body that is removed through the lymphatic system. This system is part of your body's defense system (immune system) and includes lymph nodes and lymph vessels. The lymph vessels collect and carry the excess fluid, fats, proteins, and wastes from the tissues of the body to the bloodstream. This system also works to clean and remove bacteria and waste products from the body. Lymphedema occurs when the lymphatic system is blocked. When the lymph vessels or lymph nodes are blocked or damaged, lymph does not drain properly. This causes an abnormal buildup of lymph, which leads to swelling in the affected area. This may include the trunk area, or an arm or leg. Lymphedema cannot be cured by medicines, but various methods can be used to help reduce the swelling. There are two types of lymphedema: primary lymphedema and secondary lymphedema. What are the causes? The cause of this condition depends on the type of lymphedema that you have.  Primary lymphedema is caused by the absence of lymph vessels or having abnormal lymph vessels at birth.  Secondary lymphedema occurs when lymph vessels are blocked or damaged. Secondary lymphedema is more common. Common causes of lymph vessel  blockage include: ? Skin infection, such as cellulitis. ? Infection by parasites (filariasis). ? Injury. ? Radiation therapy. ? Cancer. ? Formation of scar tissue. ? Surgery. What are the signs or symptoms? Symptoms of this condition include:  Swelling of the arm or leg.  A heavy or tight feeling in the arm or leg.  Swelling of the feet, toes, or fingers. Shoes or rings may fit more tightly than before.  Redness of the skin over the affected area.  Limited movement of the affected limb.  Sensitivity to touch or discomfort in the affected limb. How is this diagnosed? This condition may be diagnosed based on:  Your symptoms and medical history.  A physical exam.  Bioimpedance spectroscopy. In this test, painless electrical currents are used to measure fluid levels in your body.  Imaging tests, such as: ? Lymphoscintigraphy. In this test, a low dose of a radioactive substance is injected to trace the flow of lymph through the lymph vessels. ? MRI. ? CT scan. ? Duplex ultrasound. This test uses sound waves to produce images of the vessels and the blood flow on a screen. ? Lymphangiography. In this test, a contrast dye is injected into the lymph vessel to help show blockages. How is this treated? Treatment for this condition may depend on the cause of your lymphedema. Treatment may include:  Complete decongestive therapy (CDT). This is done by a certified lymphedema therapist to reduce fluid congestion. This therapy includes: ? Manual lymph drainage. This is a special massage technique that promotes lymph drainage out of a limb. ? Skin care. ? Compression wrapping of the affected area. ? Specific exercises. Certain exercises can  help fluid move out of the affected limb.  Compression. Various methods may be used to apply pressure to the affected limb to reduce the swelling. They include: ? Wearing compression stockings or sleeves on the affected limb. ? Wrapping the affected  limb with special bandages.  Surgery. This is usually done for severe cases only. For example, surgery may be done if you have trouble moving the limb or if the swelling does not get better with other treatments. If an underlying condition is causing the lymphedema, treatment for that condition will be done. For example, antibiotic medicines may be used to treat an infection. Follow these instructions at home: Self-care  The affected area is more likely to become injured or infected. Take these steps to help prevent infection: ? Keep the affected area clean and dry. ? Use approved creams or lotions to keep the skin moisturized. ? Protect your skin from cuts:  Use gloves while cooking or gardening.  Do not walk barefoot.  If you shave the affected area, use an Neurosurgeon.  Do not wear tight clothes, shoes, or jewelry.  Eat a healthy diet that includes a lot of fruits and vegetables. Activity  Exercise regularly as directed by your health care provider.  Do not sit with your legs crossed.  When possible, keep the affected limb raised (elevated) above the level of your heart.  Avoid carrying things with an arm that is affected by lymphedema. General instructions  Wear compression stockings or sleeves as told by your health care provider.  Note any changes in size of the affected limb. You may be instructed to take regular measurements and keep track of them.  Take over-the-counter and prescription medicines only as told by your health care provider.  If you were prescribed an antibiotic medicine, take or apply it as told by your health care provider. Do not stop using the antibiotic even if you start to feel better.  Do not use heating pads or ice packs over the affected area.  Avoid having blood draws, IV insertions, or blood pressure checked on the affected limb.  Keep all follow-up visits as told by your health care provider. This is important. Contact a health care  provider if you:  Continue to have swelling in your limb.  Have a cut that does not heal.  Have redness or pain in the affected area. Get help right away if you:  Have new swelling in your limb that comes on suddenly.  Develop purplish spots, rash or sores (lesions) on your affected limb.  Have shortness of breath.  Have a fever or chills. Summary  Lymphedema is swelling that is caused by the abnormal collection of lymph in the tissues under the skin.  Lymph is fluid from the tissues in your body that is removed through the lymphatic system. This system collects and carries excess fluid, fats, proteins, and wastes from the tissues of the body to the bloodstream.  Lymphedema causes swelling, pain, and redness in the affected area. This may include the trunk area, or an arm or leg.  Treatment for this condition may depend on the cause of your lymphedema. Treatment may include complete decongestive therapy (CDT), compression methods, surgery, or treating the underlying cause. This information is not intended to replace advice given to you by your health care provider. Make sure you discuss any questions you have with your health care provider. Document Released: 07/04/2007 Document Revised: 09/19/2017 Document Reviewed: 09/19/2017 Elsevier Patient Education  2020 [employer].  Return if symptoms worsen or fail to improve.   Georgiana SpinnerFallon E Destinae Neubecker, NP  This note was completed with Office managerDragon Dictation.  Any errors are purely unintentional.

## 2019-09-10 NOTE — Patient Instructions (Signed)

## 2019-10-03 DIAGNOSIS — F329 Major depressive disorder, single episode, unspecified: Secondary | ICD-10-CM | POA: Diagnosis not present

## 2019-10-03 DIAGNOSIS — K219 Gastro-esophageal reflux disease without esophagitis: Secondary | ICD-10-CM | POA: Diagnosis not present

## 2019-10-03 DIAGNOSIS — I480 Paroxysmal atrial fibrillation: Secondary | ICD-10-CM | POA: Diagnosis not present

## 2019-10-03 DIAGNOSIS — Z0001 Encounter for general adult medical examination with abnormal findings: Secondary | ICD-10-CM | POA: Diagnosis not present

## 2019-10-03 DIAGNOSIS — I2510 Atherosclerotic heart disease of native coronary artery without angina pectoris: Secondary | ICD-10-CM | POA: Diagnosis not present

## 2019-10-03 DIAGNOSIS — I209 Angina pectoris, unspecified: Secondary | ICD-10-CM | POA: Diagnosis not present

## 2019-10-03 DIAGNOSIS — I10 Essential (primary) hypertension: Secondary | ICD-10-CM | POA: Diagnosis not present

## 2019-10-03 DIAGNOSIS — Z0000 Encounter for general adult medical examination without abnormal findings: Secondary | ICD-10-CM | POA: Diagnosis not present

## 2019-10-03 DIAGNOSIS — Z13220 Encounter for screening for lipoid disorders: Secondary | ICD-10-CM | POA: Diagnosis not present

## 2019-10-30 DIAGNOSIS — I729 Aneurysm of unspecified site: Secondary | ICD-10-CM | POA: Diagnosis not present

## 2019-10-30 DIAGNOSIS — R011 Cardiac murmur, unspecified: Secondary | ICD-10-CM | POA: Diagnosis not present

## 2019-10-30 DIAGNOSIS — I2510 Atherosclerotic heart disease of native coronary artery without angina pectoris: Secondary | ICD-10-CM | POA: Diagnosis not present

## 2019-10-30 DIAGNOSIS — I209 Angina pectoris, unspecified: Secondary | ICD-10-CM | POA: Diagnosis not present

## 2019-10-30 DIAGNOSIS — I214 Non-ST elevation (NSTEMI) myocardial infarction: Secondary | ICD-10-CM | POA: Diagnosis not present

## 2019-10-30 DIAGNOSIS — I480 Paroxysmal atrial fibrillation: Secondary | ICD-10-CM | POA: Diagnosis not present

## 2019-10-30 DIAGNOSIS — S301XXA Contusion of abdominal wall, initial encounter: Secondary | ICD-10-CM | POA: Diagnosis not present

## 2019-10-30 DIAGNOSIS — T81718A Complication of other artery following a procedure, not elsewhere classified, initial encounter: Secondary | ICD-10-CM | POA: Diagnosis not present

## 2019-10-30 DIAGNOSIS — Z955 Presence of coronary angioplasty implant and graft: Secondary | ICD-10-CM | POA: Diagnosis not present

## 2019-10-30 DIAGNOSIS — R000 Tachycardia, unspecified: Secondary | ICD-10-CM | POA: Diagnosis not present

## 2019-10-30 DIAGNOSIS — I10 Essential (primary) hypertension: Secondary | ICD-10-CM | POA: Diagnosis not present

## 2019-11-12 DIAGNOSIS — S8001XA Contusion of right knee, initial encounter: Secondary | ICD-10-CM | POA: Diagnosis not present

## 2019-11-12 DIAGNOSIS — Y92009 Unspecified place in unspecified non-institutional (private) residence as the place of occurrence of the external cause: Secondary | ICD-10-CM | POA: Diagnosis not present

## 2019-11-12 DIAGNOSIS — W010XXA Fall on same level from slipping, tripping and stumbling without subsequent striking against object, initial encounter: Secondary | ICD-10-CM | POA: Diagnosis not present

## 2019-11-19 ENCOUNTER — Other Ambulatory Visit: Payer: Self-pay | Admitting: Physician Assistant

## 2019-11-19 ENCOUNTER — Other Ambulatory Visit: Payer: Self-pay

## 2019-11-19 ENCOUNTER — Ambulatory Visit
Admission: RE | Admit: 2019-11-19 | Discharge: 2019-11-19 | Disposition: A | Discharge status: Home / Self Care | Payer: PPO | Source: Ambulatory Visit | Attending: Physician Assistant | Admitting: Physician Assistant

## 2019-11-19 ENCOUNTER — Other Ambulatory Visit (HOSPITAL_COMMUNITY): Payer: Self-pay | Admitting: Physician Assistant

## 2019-11-19 DIAGNOSIS — M7989 Other specified soft tissue disorders: Secondary | ICD-10-CM | POA: Diagnosis not present

## 2019-11-19 DIAGNOSIS — L03115 Cellulitis of right lower limb: Secondary | ICD-10-CM | POA: Diagnosis not present

## 2019-11-19 DIAGNOSIS — S8001XD Contusion of right knee, subsequent encounter: Secondary | ICD-10-CM | POA: Diagnosis not present

## 2019-11-19 DIAGNOSIS — S8991XA Unspecified injury of right lower leg, initial encounter: Secondary | ICD-10-CM | POA: Diagnosis not present

## 2019-11-19 DIAGNOSIS — W010XXD Fall on same level from slipping, tripping and stumbling without subsequent striking against object, subsequent encounter: Secondary | ICD-10-CM | POA: Diagnosis not present

## 2019-11-19 DIAGNOSIS — Y92009 Unspecified place in unspecified non-institutional (private) residence as the place of occurrence of the external cause: Secondary | ICD-10-CM | POA: Diagnosis not present

## 2019-11-19 DIAGNOSIS — I480 Paroxysmal atrial fibrillation: Secondary | ICD-10-CM | POA: Diagnosis not present

## 2019-12-14 DIAGNOSIS — H353132 Nonexudative age-related macular degeneration, bilateral, intermediate dry stage: Secondary | ICD-10-CM | POA: Diagnosis not present

## 2020-01-31 DIAGNOSIS — F329 Major depressive disorder, single episode, unspecified: Secondary | ICD-10-CM | POA: Diagnosis not present

## 2020-01-31 DIAGNOSIS — I2510 Atherosclerotic heart disease of native coronary artery without angina pectoris: Secondary | ICD-10-CM | POA: Diagnosis not present

## 2020-01-31 DIAGNOSIS — I10 Essential (primary) hypertension: Secondary | ICD-10-CM | POA: Diagnosis not present

## 2020-01-31 DIAGNOSIS — K219 Gastro-esophageal reflux disease without esophagitis: Secondary | ICD-10-CM | POA: Diagnosis not present

## 2020-01-31 DIAGNOSIS — I480 Paroxysmal atrial fibrillation: Secondary | ICD-10-CM | POA: Diagnosis not present

## 2020-03-26 ENCOUNTER — Inpatient Hospital Stay
Admission: EM | Admit: 2020-03-26 | Discharge: 2020-04-02 | DRG: 480 | Disposition: A | Discharge status: Skilled Nursing Facility | Payer: PPO | Attending: Internal Medicine | Admitting: Internal Medicine

## 2020-03-26 ENCOUNTER — Encounter: Payer: Self-pay | Admitting: Emergency Medicine

## 2020-03-26 ENCOUNTER — Emergency Department: Payer: PPO

## 2020-03-26 ENCOUNTER — Other Ambulatory Visit: Payer: Self-pay

## 2020-03-26 DIAGNOSIS — S72021A Displaced fracture of epiphysis (separation) (upper) of right femur, initial encounter for closed fracture: Secondary | ICD-10-CM | POA: Diagnosis not present

## 2020-03-26 DIAGNOSIS — Z8249 Family history of ischemic heart disease and other diseases of the circulatory system: Secondary | ICD-10-CM

## 2020-03-26 DIAGNOSIS — I4891 Unspecified atrial fibrillation: Secondary | ICD-10-CM | POA: Diagnosis not present

## 2020-03-26 DIAGNOSIS — J95821 Acute postprocedural respiratory failure: Secondary | ICD-10-CM | POA: Diagnosis not present

## 2020-03-26 DIAGNOSIS — S72331A Displaced oblique fracture of shaft of right femur, initial encounter for closed fracture: Secondary | ICD-10-CM | POA: Diagnosis not present

## 2020-03-26 DIAGNOSIS — E1165 Type 2 diabetes mellitus with hyperglycemia: Secondary | ICD-10-CM | POA: Diagnosis present

## 2020-03-26 DIAGNOSIS — D62 Acute posthemorrhagic anemia: Secondary | ICD-10-CM

## 2020-03-26 DIAGNOSIS — S79929A Unspecified injury of unspecified thigh, initial encounter: Secondary | ICD-10-CM | POA: Diagnosis not present

## 2020-03-26 DIAGNOSIS — Z03818 Encounter for observation for suspected exposure to other biological agents ruled out: Secondary | ICD-10-CM | POA: Diagnosis not present

## 2020-03-26 DIAGNOSIS — Z79899 Other long term (current) drug therapy: Secondary | ICD-10-CM | POA: Diagnosis not present

## 2020-03-26 DIAGNOSIS — R0902 Hypoxemia: Secondary | ICD-10-CM | POA: Diagnosis not present

## 2020-03-26 DIAGNOSIS — R52 Pain, unspecified: Secondary | ICD-10-CM | POA: Diagnosis not present

## 2020-03-26 DIAGNOSIS — F329 Major depressive disorder, single episode, unspecified: Secondary | ICD-10-CM | POA: Diagnosis present

## 2020-03-26 DIAGNOSIS — D649 Anemia, unspecified: Secondary | ICD-10-CM | POA: Diagnosis not present

## 2020-03-26 DIAGNOSIS — I2510 Atherosclerotic heart disease of native coronary artery without angina pectoris: Secondary | ICD-10-CM | POA: Diagnosis present

## 2020-03-26 DIAGNOSIS — Z86711 Personal history of pulmonary embolism: Secondary | ICD-10-CM

## 2020-03-26 DIAGNOSIS — E785 Hyperlipidemia, unspecified: Secondary | ICD-10-CM | POA: Diagnosis not present

## 2020-03-26 DIAGNOSIS — K219 Gastro-esophageal reflux disease without esophagitis: Secondary | ICD-10-CM | POA: Diagnosis present

## 2020-03-26 DIAGNOSIS — W19XXXD Unspecified fall, subsequent encounter: Secondary | ICD-10-CM | POA: Diagnosis not present

## 2020-03-26 DIAGNOSIS — D72829 Elevated white blood cell count, unspecified: Secondary | ICD-10-CM | POA: Diagnosis not present

## 2020-03-26 DIAGNOSIS — D696 Thrombocytopenia, unspecified: Secondary | ICD-10-CM | POA: Diagnosis not present

## 2020-03-26 DIAGNOSIS — Z7901 Long term (current) use of anticoagulants: Secondary | ICD-10-CM | POA: Diagnosis not present

## 2020-03-26 DIAGNOSIS — Z79891 Long term (current) use of opiate analgesic: Secondary | ICD-10-CM

## 2020-03-26 DIAGNOSIS — R918 Other nonspecific abnormal finding of lung field: Secondary | ICD-10-CM | POA: Diagnosis not present

## 2020-03-26 DIAGNOSIS — Z20822 Contact with and (suspected) exposure to covid-19: Secondary | ICD-10-CM | POA: Diagnosis present

## 2020-03-26 DIAGNOSIS — Z7902 Long term (current) use of antithrombotics/antiplatelets: Secondary | ICD-10-CM | POA: Diagnosis not present

## 2020-03-26 DIAGNOSIS — Z955 Presence of coronary angioplasty implant and graft: Secondary | ICD-10-CM

## 2020-03-26 DIAGNOSIS — W109XXA Fall (on) (from) unspecified stairs and steps, initial encounter: Secondary | ICD-10-CM | POA: Diagnosis present

## 2020-03-26 DIAGNOSIS — R102 Pelvic and perineal pain: Secondary | ICD-10-CM | POA: Diagnosis not present

## 2020-03-26 DIAGNOSIS — Z419 Encounter for procedure for purposes other than remedying health state, unspecified: Secondary | ICD-10-CM

## 2020-03-26 DIAGNOSIS — I517 Cardiomegaly: Secondary | ICD-10-CM | POA: Diagnosis not present

## 2020-03-26 DIAGNOSIS — J9601 Acute respiratory failure with hypoxia: Secondary | ICD-10-CM

## 2020-03-26 DIAGNOSIS — J9690 Respiratory failure, unspecified, unspecified whether with hypoxia or hypercapnia: Secondary | ICD-10-CM | POA: Diagnosis not present

## 2020-03-26 DIAGNOSIS — R011 Cardiac murmur, unspecified: Secondary | ICD-10-CM | POA: Diagnosis not present

## 2020-03-26 DIAGNOSIS — F419 Anxiety disorder, unspecified: Secondary | ICD-10-CM | POA: Diagnosis present

## 2020-03-26 DIAGNOSIS — J9600 Acute respiratory failure, unspecified whether with hypoxia or hypercapnia: Secondary | ICD-10-CM

## 2020-03-26 DIAGNOSIS — R578 Other shock: Secondary | ICD-10-CM | POA: Diagnosis not present

## 2020-03-26 DIAGNOSIS — S728X1A Other fracture of right femur, initial encounter for closed fracture: Secondary | ICD-10-CM

## 2020-03-26 DIAGNOSIS — S72464A Nondisplaced supracondylar fracture with intracondylar extension of lower end of right femur, initial encounter for closed fracture: Secondary | ICD-10-CM | POA: Diagnosis not present

## 2020-03-26 DIAGNOSIS — I252 Old myocardial infarction: Secondary | ICD-10-CM | POA: Diagnosis not present

## 2020-03-26 DIAGNOSIS — S72021K Displaced fracture of epiphysis (separation) (upper) of right femur, subsequent encounter for closed fracture with nonunion: Secondary | ICD-10-CM | POA: Diagnosis not present

## 2020-03-26 DIAGNOSIS — W19XXXA Unspecified fall, initial encounter: Secondary | ICD-10-CM

## 2020-03-26 DIAGNOSIS — S7291XA Unspecified fracture of right femur, initial encounter for closed fracture: Secondary | ICD-10-CM | POA: Diagnosis not present

## 2020-03-26 DIAGNOSIS — I10 Essential (primary) hypertension: Secondary | ICD-10-CM | POA: Diagnosis present

## 2020-03-26 DIAGNOSIS — I480 Paroxysmal atrial fibrillation: Secondary | ICD-10-CM | POA: Diagnosis not present

## 2020-03-26 DIAGNOSIS — G4733 Obstructive sleep apnea (adult) (pediatric): Secondary | ICD-10-CM | POA: Diagnosis present

## 2020-03-26 DIAGNOSIS — R000 Tachycardia, unspecified: Secondary | ICD-10-CM | POA: Diagnosis not present

## 2020-03-26 DIAGNOSIS — S3993XA Unspecified injury of pelvis, initial encounter: Secondary | ICD-10-CM | POA: Diagnosis not present

## 2020-03-26 DIAGNOSIS — S72341A Displaced spiral fracture of shaft of right femur, initial encounter for closed fracture: Secondary | ICD-10-CM | POA: Diagnosis not present

## 2020-03-26 DIAGNOSIS — M79604 Pain in right leg: Secondary | ICD-10-CM | POA: Diagnosis not present

## 2020-03-26 DIAGNOSIS — M2550 Pain in unspecified joint: Secondary | ICD-10-CM | POA: Diagnosis not present

## 2020-03-26 DIAGNOSIS — S72021D Displaced fracture of epiphysis (separation) (upper) of right femur, subsequent encounter for closed fracture with routine healing: Secondary | ICD-10-CM | POA: Diagnosis not present

## 2020-03-26 DIAGNOSIS — Z01811 Encounter for preprocedural respiratory examination: Secondary | ICD-10-CM

## 2020-03-26 DIAGNOSIS — S72001D Fracture of unspecified part of neck of right femur, subsequent encounter for closed fracture with routine healing: Secondary | ICD-10-CM | POA: Diagnosis not present

## 2020-03-26 DIAGNOSIS — J90 Pleural effusion, not elsewhere classified: Secondary | ICD-10-CM | POA: Diagnosis not present

## 2020-03-26 DIAGNOSIS — Z978 Presence of other specified devices: Secondary | ICD-10-CM

## 2020-03-26 DIAGNOSIS — Z7401 Bed confinement status: Secondary | ICD-10-CM | POA: Diagnosis not present

## 2020-03-26 LAB — CBC WITH DIFFERENTIAL/PLATELET
Abs Immature Granulocytes: 0.05 10*3/uL (ref 0.00–0.07)
Basophils Absolute: 0.1 10*3/uL (ref 0.0–0.1)
Basophils Relative: 1 %
Eosinophils Absolute: 0.2 10*3/uL (ref 0.0–0.5)
Eosinophils Relative: 2 %
HCT: 36.2 % (ref 36.0–46.0)
Hemoglobin: 12.6 g/dL (ref 12.0–15.0)
Immature Granulocytes: 1 %
Lymphocytes Relative: 15 %
Lymphs Abs: 1.4 10*3/uL (ref 0.7–4.0)
MCH: 30.5 pg (ref 26.0–34.0)
MCHC: 34.8 g/dL (ref 30.0–36.0)
MCV: 87.7 fL (ref 80.0–100.0)
Monocytes Absolute: 0.5 10*3/uL (ref 0.1–1.0)
Monocytes Relative: 6 %
Neutro Abs: 6.7 10*3/uL (ref 1.7–7.7)
Neutrophils Relative %: 75 %
Platelets: 148 10*3/uL — ABNORMAL LOW (ref 150–400)
RBC: 4.13 MIL/uL (ref 3.87–5.11)
RDW: 13.3 % (ref 11.5–15.5)
WBC: 8.9 10*3/uL (ref 4.0–10.5)
nRBC: 0 % (ref 0.0–0.2)

## 2020-03-26 LAB — APTT: aPTT: <24 seconds — ABNORMAL LOW (ref 24–36)

## 2020-03-26 LAB — PROTIME-INR
INR: 1.1 (ref 0.8–1.2)
Prothrombin Time: 13.8 seconds (ref 11.4–15.2)

## 2020-03-26 LAB — BASIC METABOLIC PANEL
Anion gap: 8 (ref 5–15)
BUN: 17 mg/dL (ref 8–23)
CO2: 24 mmol/L (ref 22–32)
Calcium: 8.9 mg/dL (ref 8.9–10.3)
Chloride: 110 mmol/L (ref 98–111)
Creatinine, Ser: 0.75 mg/dL (ref 0.44–1.00)
GFR calc Af Amer: >60 mL/min (ref >60)
GFR calc non Af Amer: >60 mL/min (ref >60)
Glucose, Bld: 166 mg/dL — ABNORMAL HIGH (ref 70–99)
Potassium: 3.7 mmol/L (ref 3.5–5.1)
Sodium: 142 mmol/L (ref 135–145)

## 2020-03-26 LAB — SARS CORONAVIRUS 2 BY RT PCR (HOSPITAL ORDER, PERFORMED IN ~~LOC~~ HOSPITAL LAB): SARS Coronavirus 2: NEGATIVE

## 2020-03-26 MED ORDER — OXYCODONE HCL 5 MG PO TABS: 5.0000 mg | ORAL_TABLET | ORAL | Status: DC | PRN

## 2020-03-26 MED ORDER — ONDANSETRON HCL 4 MG/2ML IJ SOLN: 4.0000 mg | Freq: Four times a day (QID) | INTRAMUSCULAR | Status: DC | PRN

## 2020-03-26 MED ORDER — ONDANSETRON 4 MG PO TBDP
4.0000 mg | ORAL_TABLET | Freq: Three times a day (TID) | ORAL | Status: DC | PRN
  Filled 2020-03-26: qty 1

## 2020-03-26 MED ORDER — MORPHINE SULFATE (PF) 4 MG/ML IV SOLN
4.0000 mg | Freq: Once | INTRAVENOUS | Status: AC
  Administered 2020-03-26: 4 mg via INTRAVENOUS
  Filled 2020-03-26: qty 1

## 2020-03-26 MED ORDER — METHOCARBAMOL 1000 MG/10ML IJ SOLN
500.0000 mg | Freq: Three times a day (TID) | INTRAVENOUS | Status: DC | PRN
  Administered 2020-03-27 (×2): 500 mg via INTRAVENOUS
  Filled 2020-03-26 (×4): qty 5

## 2020-03-26 MED ORDER — ONDANSETRON HCL 4 MG/2ML IJ SOLN
4.0000 mg | Freq: Once | INTRAMUSCULAR | Status: AC
  Administered 2020-03-26: 4 mg via INTRAVENOUS
  Filled 2020-03-26: qty 2

## 2020-03-26 MED ORDER — SERTRALINE HCL 50 MG PO TABS
100.0000 mg | ORAL_TABLET | Freq: Every day | ORAL | Status: DC
  Administered 2020-03-29 – 2020-04-02 (×5): 100 mg via ORAL
  Filled 2020-03-26 (×5): qty 2

## 2020-03-26 MED ORDER — GUAIFENESIN-DM 100-10 MG/5ML PO SYRP
10.0000 mL | ORAL_SOLUTION | Freq: Four times a day (QID) | ORAL | Status: DC | PRN
  Filled 2020-03-26: qty 10

## 2020-03-26 MED ORDER — ATORVASTATIN CALCIUM 80 MG PO TABS
80.0000 mg | ORAL_TABLET | Freq: Every day | ORAL | Status: DC
  Administered 2020-03-29 – 2020-04-01 (×4): 80 mg via ORAL
  Filled 2020-03-26 (×2): qty 4
  Filled 2020-03-26 (×2): qty 1

## 2020-03-26 MED ORDER — MORPHINE SULFATE (PF) 4 MG/ML IV SOLN
4.0000 mg | INTRAVENOUS | Status: DC | PRN
  Administered 2020-03-26 – 2020-03-29 (×3): 4 mg via INTRAVENOUS
  Filled 2020-03-26 (×3): qty 1

## 2020-03-26 MED ORDER — DOCUSATE SODIUM 100 MG PO CAPS: 100.0000 mg | ORAL_CAPSULE | Freq: Two times a day (BID) | ORAL | Status: DC | PRN

## 2020-03-26 MED ORDER — POLYETHYLENE GLYCOL 3350 17 G PO PACK: 17.0000 g | PACK | Freq: Two times a day (BID) | ORAL | Status: DC | PRN

## 2020-03-26 MED ORDER — OXYCODONE HCL 5 MG PO TABS
5.0000 mg | ORAL_TABLET | ORAL | Status: DC | PRN
  Administered 2020-03-27: 5 mg via ORAL
  Filled 2020-03-26: qty 1

## 2020-03-26 MED ORDER — ALUM & MAG HYDROXIDE-SIMETH 200-200-20 MG/5ML PO SUSP: 15.0000 mL | Freq: Four times a day (QID) | ORAL | Status: DC | PRN

## 2020-03-26 MED ORDER — CALCIUM CARBONATE ANTACID 500 MG PO CHEW: 1.0000 | CHEWABLE_TABLET | Freq: Three times a day (TID) | ORAL | Status: DC | PRN

## 2020-03-26 MED ORDER — CEFAZOLIN SODIUM-DEXTROSE 2-4 GM/100ML-% IV SOLN
2.0000 g | INTRAVENOUS | Status: AC
  Administered 2020-03-27: 2 g via INTRAVENOUS
  Filled 2020-03-26: qty 100

## 2020-03-26 MED ORDER — TRAZODONE HCL 50 MG PO TABS
25.0000 mg | ORAL_TABLET | Freq: Every evening | ORAL | Status: DC | PRN
  Administered 2020-03-29 – 2020-03-31 (×2): 25 mg via ORAL
  Filled 2020-03-26 (×3): qty 1

## 2020-03-26 MED ORDER — SODIUM CHLORIDE 0.9 % IV BOLUS
500.0000 mL | Freq: Once | INTRAVENOUS | Status: AC
  Administered 2020-03-26: 500 mL via INTRAVENOUS

## 2020-03-26 MED ORDER — METOPROLOL TARTRATE 25 MG PO TABS
12.5000 mg | ORAL_TABLET | Freq: Two times a day (BID) | ORAL | Status: DC
  Administered 2020-03-26 – 2020-03-27 (×2): 12.5 mg via ORAL
  Filled 2020-03-26 (×2): qty 1

## 2020-03-26 NOTE — Consult Note (Signed)
ORTHOPAEDIC CONSULTATION  REQUESTING PHYSICIAN: Darlin Priestly, MD  Chief Complaint: Right thigh pain status post fall  HPI: Alison Russo is a 86 y.o. female presented to the [facility] regional emergency department after a fall today onto her right side. She states she fell onto concrete in her driveway and had severe pain in the right thigh after the fall and was unable to weight-bear. X-ray films upon presentation to the emergency department shows a displaced fracture of the right femoral shaft. Patient seen in the emergency department with her daughter at the bedside along with another adult female companion. Patient is complaining of significant pain in the right thigh. She states she has intermittent severe pain consistent with muscle spasms. She denies any numbness or tingling in the right lower extremity. She denies other injuries.  Past Medical History:  Diagnosis Date  . Anginal pain (HCC)   . CAD (coronary artery disease)   . Heart murmur   . Hypertension   . Mood disorder (HCC)    mostly anxiety  . NSTEMI (non-ST elevated myocardial infarction) (HCC) 12/22/2017  . OSA on CPAP   . PE (pulmonary embolism)    recurrent   Past Surgical History:  Procedure Laterality Date  . BREAST CYST ASPIRATION Right    neg  . CARDIAC CATHETERIZATION    . CATARACT EXTRACTION, BILATERAL    . COLONOSCOPY    . CORONARY STENT INTERVENTION N/A 12/23/2017   Procedure: CORONARY STENT INTERVENTION;  Surgeon: Swaziland, Peter M, MD;  Location: [facility] INVASIVE CV LAB;  Service: Cardiovascular;  Laterality: N/A;  . EYE SURGERY Left    "blood vessel busted behind my eye"  . EYE SURGERY Right    ?vitrectomy  . HYSTEROSCOPY WITH D & C N/A 10/25/2016   Procedure: DILATATION AND CURETTAGE /HYSTEROSCOPY, POLYPECTOMY;  Surgeon: Christeen Douglas, MD;  Location: [facility] ORS;  Service: Gynecology;  Laterality: N/A;  . INGUINAL HERNIA REPAIR Right 1950s  . LEFT HEART CATH AND CORONARY ANGIOGRAPHY N/A 12/21/2017   Procedure:  LEFT HEART CATH AND CORONARY ANGIOGRAPHY possible PCI and stent;  Surgeon: Alwyn Pea, MD;  Location: [facility] INVASIVE CV LAB;  Service: Cardiovascular;  Laterality: N/A;  . LOWER EXTREMITY ANGIOGRAPHY Right 01/11/2018   Procedure: LOWER EXTREMITY ANGIOGRAPHY;  Surgeon: Annice Needy, MD;  Location: [facility] INVASIVE CV LAB;  Service: Cardiovascular;  Laterality: Right;  . PSEUDOANERYSM COMPRESSION Left 01/20/2018   Procedure: PSEUDOANERYSM COMPRESSION;  Surgeon: Renford Dills, MD;  Location: [facility] INVASIVE CV LAB;  Service: Cardiovascular;  Laterality: Left;  . TONSILLECTOMY     Social History   Socioeconomic History  . Marital status: Widowed    Spouse name: Not on file  . Number of children: 2  . Years of education: Not on file  . Highest education level: Not on file  Occupational History  . Occupation: Office work  . Occupation: Custodian    Comment: [employer]  Tobacco Use  . Smoking status: Never Smoker  . Smokeless tobacco: Never Used  Vaping Use  . Vaping Use: Never used  Substance and Sexual Activity  . Alcohol use: Never  . Drug use: Never  . Sexual activity: Not Currently  Other Topics Concern  . Not on file  Social History Narrative   Has living will   Daughter is health care POA   Would accept resuscitation attempts   No tube feeds if cognitively unaware   Social Determinants of Health   Financial Resource Strain:   . Difficulty of  Paying Living Expenses:   Food Insecurity:   . Worried About Programme researcher, broadcasting/film/video in the Last Year:   . Barista in the Last Year:   Transportation Needs:   . Freight forwarder (Medical):   Marland Kitchen Lack of Transportation (Non-Medical):   Physical Activity:   . Days of Exercise per Week:   . Minutes of Exercise per Session:   Stress:   . Feeling of Stress :   Social Connections:   . Frequency of Communication with Friends and Family:   . Frequency of Social Gatherings with Friends and Family:   . Attends  Religious Services:   . Active Member of Clubs or Organizations:   . Attends Banker Meetings:   Marland Kitchen Marital Status:    Family History  Problem Relation Age of Onset  . Breast cancer Mother 100  . AAA (abdominal aortic aneurysm) Father        ruptured  . Coronary artery disease Brother    No Known Allergies Prior to Admission medications   Medication Sig Start Date End Date Taking? Authorizing Provider  atorvastatin (LIPITOR) 80 MG tablet TAKE 1 TABLET (80 MG TOTAL) BY MOUTH DAILY AT 6 PM. 03/03/18  Yes Lyndon Code, MD  clopidogrel (PLAVIX) 75 MG tablet Take 1 tablet (75 mg total) by mouth daily with breakfast. 12/26/17  Yes Creig Hines, NP  metoprolol tartrate (LOPRESSOR) 25 MG tablet Take 0.5 tablets (12.5 mg total) by mouth 2 (two) times daily. 03/20/18  Yes Boscia, Kathlynn Grate, NP  Multiple Vitamins-Minerals (PRESERVISION AREDS 2+MULTI VIT PO) Take 1 tablet by mouth 2 (two) times daily.   Yes [provider]  sertraline (ZOLOFT) 100 MG tablet Take 1 tablet (100 mg total) by mouth daily. 03/20/18  Yes Boscia, Kathlynn Grate, NP  acetaminophen (TYLENOL) 325 MG tablet Take 2 tablets (650 mg total) by mouth every 6 (six) hours as needed for mild pain (or Fever >/= 101). 01/23/18   Enid Baas, MD  feeding supplement, ENSURE ENLIVE, (ENSURE ENLIVE) LIQD Take 237 mLs by mouth 2 (two) times daily between meals. 01/24/18   Enid Baas, MD  Naphazoline-Pheniramine (OPCON-A OP) Place 1-2 drops into both eyes 3 (three) times daily as needed (for itchy/irritated eyes.).    [provider]  pantoprazole (PROTONIX) 40 MG tablet TAKE 1 TABLET BY MOUTH EVERY DAY Patient not taking: Reported on 03/26/2020 02/06/18   Carlean Jews, NP  polyethylene glycol (MIRALAX / GLYCOLAX) packet Take 17 g by mouth daily. Hold if >2 bowel movements /day 01/23/18   Enid Baas, MD  traMADol (ULTRAM) 50 MG tablet Take 1 tablet (50 mg total) by mouth every 6 (six) hours as  needed for moderate pain. Patient not taking: Reported on 09/06/2019 01/23/18   Enid Baas, MD  traZODone (DESYREL) 50 MG tablet Take 0.5 tablets (25 mg total) by mouth at bedtime as needed for sleep. Patient not taking: Reported on 03/26/2020 12/22/17   Altamese Dilling, MD   DG Chest 1 View  Result Date: 03/26/2020 CLINICAL DATA:  86 year old female with right femoral fracture. EXAM: CHEST  1 VIEW COMPARISON:  Chest radiograph dated 12/20/2017. FINDINGS: No focal consolidation, pleural effusion, or pneumothorax. Mild chronic interstitial coarsening. There is stable cardiomegaly. Osteopenia. No acute osseous pathology. IMPRESSION: No acute cardiopulmonary process. Electronically Signed   By: Elgie Collard M.D.   On: 03/26/2020 16:28   DG Pelvis 1-2 Views  Result Date: 03/26/2020 CLINICAL DATA:  86 year old female with  fall and right hip pain. EXAM: RIGHT FEMUR 2 VIEWS; PELVIS - 1-2 VIEW COMPARISON:  CT abdomen pelvis dated 01/19/2018. FINDINGS: There is a displaced oblique or spiral fracture of the proximal right femur extending from the femoral neck into the proximal femoral diaphysis. There is full shaft width medial displacement of the distal fracture fragment. There is no dislocation. The bones are osteopenic. Vascular calcification as well as calcified fibroid over the left pelvis. The soft tissues are grossly unremarkable. IMPRESSION: Displaced oblique or spiral fracture of the proximal right femur. Electronically Signed   By: Elgie Collard M.D.   On: 03/26/2020 16:28   DG Femur Min 2 Views Right  Result Date: 03/26/2020 CLINICAL DATA:  86 year old female with fall and right hip pain. EXAM: RIGHT FEMUR 2 VIEWS; PELVIS - 1-2 VIEW COMPARISON:  CT abdomen pelvis dated 01/19/2018. FINDINGS: There is a displaced oblique or spiral fracture of the proximal right femur extending from the femoral neck into the proximal femoral diaphysis. There is full shaft width medial displacement of the  distal fracture fragment. There is no dislocation. The bones are osteopenic. Vascular calcification as well as calcified fibroid over the left pelvis. The soft tissues are grossly unremarkable. IMPRESSION: Displaced oblique or spiral fracture of the proximal right femur. Electronically Signed   By: Elgie Collard M.D.   On: 03/26/2020 16:28    Positive ROS: All other systems have been reviewed and were otherwise negative with the exception of those mentioned in the HPI and as above.  Physical Exam: General: Alert, patient in pain  MUSCULOSKELETAL: Right lower extremity: Patient's skin is intact throughout the right lower extremity. There is no erythema ecchymosis and only mild to moderate swelling of the right thigh. Her leg and thigh compartments are soft and compressible. She has palpable pedal pulses. Patient can dorsiflex and plantarflex her ankle. Patient has shortening and external rotation to the right lower extremity.  Assessment: Closed, displaced fracture of the right femoral shaft  Plan: I explained to Ms. Caprara and her daughter the nature of her fracture. They understand this is a significant injury. It will require surgical fixation for her to move her right lower extremity without significant pain and to allow her the possibility of returning to ambulation and weightbearing. I recommended intramedullary fixation for her fracture. It will likely require backup fixation with cables to prevent displacement of the long oblique shaft fracture.  I reviewed the details of the operation as well as the postoperative course with them. I also discussed the risks and benefits of surgery. They understand the risks include but are not limited to infection, bleeding requiring blood transfusion, nerve or blood vessel injury, joint stiffness or loss of motion, persistent pain, weakness or instability, malunion, nonunion and hardware failure and the need for further surgery. Medical risks include but  are not limited to DVT and pulmonary embolism, myocardial infarction, stroke, pneumonia, respiratory failure and death. The patient and her daughter understood these risks and wished to proceed.   Patient will be n.p.o. after midnight. She is on Plavix but has not taken her dose since yesterday. Patient will not receive anticoagulation therapy overnight in preparation for surgery tomorrow. Patient has a history of previous heart catheterizations and a history of pulmonary embolism. Surgery is scheduled for tomorrow afternoon.   Juanell Fairly, MD    03/26/2020 9:43 PM

## 2020-03-26 NOTE — ED Triage Notes (Addendum)
Pt from home via [facility]. Per EMS, pt walking in driving slipped and fell on right side, denies LOC or hitting her head. Pt c.o of severe leg pain. Pt A/ox4.  HOH.  Given fentanyl by [facility]  18G L/FA. EMS: 160/102 Hr 130's  CBG 285

## 2020-03-26 NOTE — ED Provider Notes (Signed)
[facility] Emergency Department Provider Note   ____________________________________________    I have reviewed the triage vital signs and the nursing notes.   HISTORY  Chief Complaint Fall     HPI Alison Russo is a 86 y.o. female with a history as noted below who presents with right hip pain status post mechanical fall.  Patient reports she tripped in her driveway over a small ledge.  She fell onto her right side.  She complains primarily of right hip pain as well some mild pain in her right elbow.  No other injuries reported.  No nausea or vomiting or abdominal pain.  Complains of severe pain in her right hip, no radiation.  Received 100 mcg of fentanyl via EMS  Past Medical History:  Diagnosis Date  . Anginal pain (HCC)   . CAD (coronary artery disease)   . Heart murmur   . Hypertension   . Mood disorder (HCC)    mostly anxiety  . NSTEMI (non-ST elevated myocardial infarction) (HCC) 12/22/2017  . OSA on CPAP   . PE (pulmonary embolism)    recurrent    Patient Active Problem List   Diagnosis Date Noted  . Sleep apnea 11/29/2018  . Gastroesophageal reflux disease without esophagitis 02/20/2018  . Mood disorder (HCC)   . Hematoma 02/01/2018  . Lymphedema 02/01/2018  . Leg pain 01/09/2018  . Atrial fibrillation (HCC) 12/28/2017  . Heart murmur 12/28/2017  . Tachycardia 12/28/2017  . CAD (coronary artery disease) 12/25/2017  . Essential hypertension 12/25/2017  . Hyperlipidemia 12/25/2017  . History of pulmonary embolism 12/25/2017  . Anemia 12/25/2017  . Chronic anticoagulation 12/22/2017  . NSTEMI (non-ST elevated myocardial infarction) (HCC) 12/21/2017  . Contusion of right knee 02/27/2016  . 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 BREAST CYST ASPIRATION Right    neg  . CARDIAC CATHETERIZATION    . CATARACT EXTRACTION, BILATERAL    . COLONOSCOPY    . CORONARY STENT  INTERVENTION N/A 12/23/2017   Procedure: CORONARY STENT INTERVENTION;  Surgeon: Swaziland, Peter M, MD;  Location: [facility] INVASIVE CV LAB;  Service: Cardiovascular;  Laterality: N/A;  . EYE SURGERY Left    "blood vessel busted behind my eye"  . EYE SURGERY Right    ?vitrectomy  . HYSTEROSCOPY WITH D & C N/A 10/25/2016   Procedure: DILATATION AND CURETTAGE /HYSTEROSCOPY, POLYPECTOMY;  Surgeon: Christeen Douglas, MD;  Location: [facility] ORS;  Service: Gynecology;  Laterality: N/A;  . INGUINAL HERNIA REPAIR Right 1950s  . LEFT HEART CATH AND CORONARY ANGIOGRAPHY N/A 12/21/2017   Procedure: LEFT HEART CATH AND CORONARY ANGIOGRAPHY possible PCI and stent;  Surgeon: Alwyn Pea, MD;  Location: [facility] INVASIVE CV LAB;  Service: Cardiovascular;  Laterality: N/A;  . LOWER EXTREMITY ANGIOGRAPHY Right 01/11/2018   Procedure: LOWER EXTREMITY ANGIOGRAPHY;  Surgeon: Annice Needy, MD;  Location: [facility] INVASIVE CV LAB;  Service: Cardiovascular;  Laterality: Right;  . PSEUDOANERYSM COMPRESSION Left 01/20/2018   Procedure: PSEUDOANERYSM COMPRESSION;  Surgeon: Renford Dills, MD;  Location: [facility] INVASIVE CV LAB;  Service: Cardiovascular;  Laterality: Left;  . TONSILLECTOMY      Prior to Admission medications   Medication Sig Start Date End Date Taking? Authorizing Provider  acetaminophen (TYLENOL) 325 MG tablet Take 2 tablets (650 mg total) by mouth every 6 (six) hours as needed for mild pain (or Fever >/= 101). 01/23/18   Enid Baas, MD  atorvastatin (LIPITOR) 80  MG tablet TAKE 1 TABLET (80 MG TOTAL) BY MOUTH DAILY AT 6 PM. 03/03/18   Lyndon Code, MD  clopidogrel (PLAVIX) 75 MG tablet Take 1 tablet (75 mg total) by mouth daily with breakfast. 12/26/17   Creig Hines, NP  feeding supplement, ENSURE ENLIVE, (ENSURE ENLIVE) LIQD Take 237 mLs by mouth 2 (two) times daily between meals. 01/24/18   Enid Baas, MD  metoprolol tartrate (LOPRESSOR) 25 MG tablet Take 0.5 tablets (12.5 mg total) by mouth 2  (two) times daily. 03/20/18   Carlean Jews, NP  Multiple Vitamins-Minerals (PRESERVISION AREDS 2+MULTI VIT PO) Take 1 tablet by mouth 2 (two) times daily.    [provider]  Naphazoline-Pheniramine (OPCON-A OP) Place 1-2 drops into both eyes 3 (three) times daily as needed (for itchy/irritated eyes.).    [provider]  pantoprazole (PROTONIX) 40 MG tablet TAKE 1 TABLET BY MOUTH EVERY DAY 02/06/18   Carlean Jews, NP  polyethylene glycol (MIRALAX / GLYCOLAX) packet Take 17 g by mouth daily. Hold if >2 bowel movements /day 01/23/18   Enid Baas, MD  sertraline (ZOLOFT) 100 MG tablet Take 1 tablet (100 mg total) by mouth daily. 03/20/18   Carlean Jews, NP  traMADol (ULTRAM) 50 MG tablet Take 1 tablet (50 mg total) by mouth every 6 (six) hours as needed for moderate pain. Patient not taking: Reported on 09/06/2019 01/23/18   Enid Baas, MD  traZODone (DESYREL) 50 MG tablet Take 0.5 tablets (25 mg total) by mouth at bedtime as needed for sleep. 12/22/17   Altamese Dilling, MD     Allergies Patient has no known allergies.  Family History  Problem Relation Age of Onset  . Breast cancer Mother 100  . AAA (abdominal aortic aneurysm) Father        ruptured  . Coronary artery disease Brother     Social History Social History   Tobacco Use  . Smoking status: Never Smoker  . Smokeless tobacco: Never Used  Vaping Use  . Vaping Use: Never used  Substance Use Topics  . Alcohol use: Never  . Drug use: Never    Review of Systems  Constitutional: No dizziness Eyes: No visual changes.  ENT: No neck pain Cardiovascular: Denies chest pain. Respiratory: Denies shortness of breath. Gastrointestinal: No abdominal pain Genitourinary: No groin injury Musculoskeletal: As above, no back pain Skin: Negative for rash. Neurological: Negative for headaches or weakness   ____________________________________________   PHYSICAL EXAM:  VITAL SIGNS: ED  Triage Vitals  Enc Vitals Group     BP 03/26/20 1439 (!) 145/104     Pulse Rate 03/26/20 1439 (!) 110     Resp 03/26/20 1439 20     Temp 03/26/20 1439 98.4 F (36.9 C)     Temp Source 03/26/20 1439 Oral     SpO2 03/26/20 1439 92 %     Weight 03/26/20 1440 89.8 kg (198 lb)     Height 03/26/20 1440 1.676 m (5\' 6" )     Head Circumference --      Peak Flow --      Pain Score 03/26/20 1440 10     Pain Loc --      Pain Edu? --      Excl. in GC? --     Constitutional: Alert and oriented. Eyes: Conjunctivae are normal.  Head: Atraumatic. Nose: No congestion/rhinnorhea. Mouth/Throat: Mucous membranes are moist.   Neck:  Painless ROM, no vertebral tenderness palpation Cardiovascular: Normal rate, irregular  rhythm.   Good peripheral circulation. Respiratory: Normal respiratory effort.  No retractions.  Gastrointestinal: Soft and nontender. No distention.   Musculoskeletal: Patient with significant tenderness palpation along the right lateral mid femur, no palpable bony abnormality, warm and well perfused Neurologic:  Normal speech and language. No gross focal neurologic deficits are appreciated.  Skin:  Skin is warm, dry and intact. No rash noted. Psychiatric: Mood and affect are normal. Speech and behavior are normal.  ____________________________________________   LABS (all labs ordered are listed, but only abnormal results are displayed)  Labs Reviewed  BASIC METABOLIC PANEL - Abnormal; Notable for the following components:      Result Value   Glucose, Bld 166 (*)    All other components within normal limits  CBC WITH DIFFERENTIAL/PLATELET - Abnormal; Notable for the following components:   Platelets 148 (*)    All other components within normal limits  APTT - Abnormal; Notable for the following components:   aPTT <24 (*)    All other components within normal limits  SARS CORONAVIRUS 2 BY RT PCR (HOSPITAL ORDER, PERFORMED IN [facility])  PROTIME-INR  TYPE AND  SCREEN   ____________________________________________  EKG  None ____________________________________________  RADIOLOGY  X-ray femur/pelvis reviewed by me, consistent with fracture ____________________________________________   PROCEDURES  Procedure(s) performed: No  Procedures   Critical Care performed: No ____________________________________________   INITIAL IMPRESSION / ASSESSMENT AND PLAN / ED COURSE  Pertinent labs & imaging results that were available during my care of the patient were reviewed by me and considered in my medical decision making (see chart for details).  Patient presents after mechanical fall with injury to her right leg.  Presentation concerning for hip/femur fracture.  Additional 4 of morphine given for continued severe pain after fentanyl.  X-ray pending  X-ray consistent with spiral femur fracture, discussed with Dr. Martha Clan, recommends medicine admission, he will consult    ____________________________________________   FINAL CLINICAL IMPRESSION(S) / ED DIAGNOSES  Final diagnoses:  Other closed fracture of right femur, unspecified portion of femur, initial encounter [facility])        Note:  This document was prepared using Dragon voice recognition software and may include unintentional dictation errors.   Jene Every, MD 03/26/20 1728

## 2020-03-26 NOTE — Progress Notes (Signed)
Report called to sylvia, rn

## 2020-03-26 NOTE — H&P (Signed)
History and Physical    [name]:[id number] DOB: 1926/02/23 DOA: 03/26/2020  PCP: Gracelyn Nurse, MD  Patient coming from: home  I have personally briefly reviewed patient's old medical records in [facility]  Chief Complaint: right leg pain  HPI: Alison Russo is a 86 y.o. female with medical history significant of CAD, NSTEMI s/p stents, HTN who presented with right leg pain after a accidental fall.   Pt reported walking outside and missed a step and fell onto her right side, with subsequent severe pain in her right leg.  Prior to the fall, pt was at her baseline.  No recent illness.  No fever, dyspnea, chest pain, abdominal pain, N/V/D, dysuria.   ED Course: initial vitals: afebrile, pulse 110, BP 145/104, sating 92% on room air.  Labs were remarkable.  Xray showed "Displaced oblique or spiral fracture of the proximal right femur."  Ortho contacted by ED provider.   Assessment/Plan Active Problems:   Closed displaced fracture of proximal epiphysis of right femur (HCC)  # Displaced fracture of the proximal right femur # Accidental fall --INTRAMEDULLARY (IM) NAIL INTERTROCHANTRIC with Dr. Martha Clan tomorrow --Oxycodone 5 mg PRN for pain --IV morphine PRN for break through pain  Hx of NSTEMI s/p stents Hx of CAD --continue lipitor --hold plavix (last dose on morning of 7/6)  HTN --continue home metop  Depression --continue home zoloft   DVT prophylaxis: SCD/Compression stockings Code Status: Full code  Family Communication: niece and daughter updated on admission  Disposition Plan: likely SNF rehab  Consults called: ortho Admission status: Inpatient   Review of Systems: As per HPI otherwise 10 point review of systems negative.   Past Medical History:  Diagnosis Date  . Anginal pain (HCC)   . CAD (coronary artery disease)   . Heart murmur   . Hypertension   . Mood disorder (HCC)    mostly anxiety  . NSTEMI (non-ST elevated myocardial  infarction) (HCC) 12/22/2017  . OSA on CPAP   . PE (pulmonary embolism)    recurrent    Past Surgical History:  Procedure Laterality Date  . BREAST CYST ASPIRATION Right    neg  . CARDIAC CATHETERIZATION    . CATARACT EXTRACTION, BILATERAL    . COLONOSCOPY    . CORONARY STENT INTERVENTION N/A 12/23/2017   Procedure: CORONARY STENT INTERVENTION;  Surgeon: Swaz[place], Peter M, MD;  Location: [facility] INVASIVE CV LAB;  Service: Cardiovascular;  Laterality: N/A;  . EYE SURGERY Left    "blood vessel busted behind my eye"  . EYE SURGERY Right    ?vitrectomy  . HYSTEROSCOPY WITH D & C N/A 10/25/2016   Procedure: DILATATION AND CURETTAGE /HYSTEROSCOPY, POLYPECTOMY;  Surgeon: Christeen Douglas, MD;  Location: [facility] ORS;  Service: Gynecology;  Laterality: N/A;  . INGUINAL HERNIA REPAIR Right 1950s  . LEFT HEART CATH AND CORONARY ANGIOGRAPHY N/A 12/21/2017   Procedure: LEFT HEART CATH AND CORONARY ANGIOGRAPHY possible PCI and stent;  Surgeon: Alwyn Pea, MD;  Location: [facility] INVASIVE CV LAB;  Service: Cardiovascular;  Laterality: N/A;  . LOWER EXTREMITY ANGIOGRAPHY Right 01/11/2018   Procedure: LOWER EXTREMITY ANGIOGRAPHY;  Surgeon: Annice Needy, MD;  Location: [facility] INVASIVE CV LAB;  Service: Cardiovascular;  Laterality: Right;  . PSEUDOANERYSM COMPRESSION Left 01/20/2018   Procedure: PSEUDOANERYSM COMPRESSION;  Surgeon: Renford Dills, MD;  Location: [facility] INVASIVE CV LAB;  Service: Cardiovascular;  Laterality: Left;  . TONSILLECTOMY       reports that she has never smoked.  She has never used smokeless tobacco. She reports that she does not drink alcohol and does not use drugs.  No Known Allergies  Family History  Problem Relation Age of Onset  . Breast cancer Mother 100  . AAA (abdominal aortic aneurysm) Father        ruptured  . Coronary artery disease Brother      Prior to Admission medications   Medication Sig Start Date End Date Taking? Authorizing Provider  acetaminophen (TYLENOL) 325  MG tablet Take 2 tablets (650 mg total) by mouth every 6 (six) hours as needed for mild pain (or Fever >/= 101). 01/23/18   Enid Baas, MD  atorvastatin (LIPITOR) 80 MG tablet TAKE 1 TABLET (80 MG TOTAL) BY MOUTH DAILY AT 6 PM. 03/03/18   Lyndon Code, MD  clopidogrel (PLAVIX) 75 MG tablet Take 1 tablet (75 mg total) by mouth daily with breakfast. 12/26/17   Creig Hines, NP  feeding supplement, ENSURE ENLIVE, (ENSURE ENLIVE) LIQD Take 237 mLs by mouth 2 (two) times daily between meals. 01/24/18   Enid Baas, MD  metoprolol tartrate (LOPRESSOR) 25 MG tablet Take 0.5 tablets (12.5 mg total) by mouth 2 (two) times daily. 03/20/18   Carlean Jews, NP  Multiple Vitamins-Minerals (PRESERVISION AREDS 2+MULTI VIT PO) Take 1 tablet by mouth 2 (two) times daily.    [provider]  Naphazoline-Pheniramine (OPCON-A OP) Place 1-2 drops into both eyes 3 (three) times daily as needed (for itchy/irritated eyes.).    [provider]  pantoprazole (PROTONIX) 40 MG tablet TAKE 1 TABLET BY MOUTH EVERY DAY Patient taking differently: Take 40 mg by mouth 3 (three) times a week.  02/06/18   Carlean Jews, NP  polyethylene glycol (MIRALAX / GLYCOLAX) packet Take 17 g by mouth daily. Hold if >2 bowel movements /day 01/23/18   Enid Baas, MD  sertraline (ZOLOFT) 100 MG tablet Take 1 tablet (100 mg total) by mouth daily. 03/20/18   Carlean Jews, NP  traMADol (ULTRAM) 50 MG tablet Take 1 tablet (50 mg total) by mouth every 6 (six) hours as needed for moderate pain. Patient not taking: Reported on 09/06/2019 01/23/18   Enid Baas, MD  traZODone (DESYREL) 50 MG tablet Take 0.5 tablets (25 mg total) by mouth at bedtime as needed for sleep. 12/22/17   Altamese Dilling, MD    Physical Exam: Vitals:   03/26/20 1439 03/26/20 1440 03/26/20 1643 03/26/20 1842  BP: (!) 145/104  135/89 136/88  Pulse: (!) 110  92   Resp: 20  20 (!) 22  Temp: 98.4 F (36.9 C)       TempSrc: Oral     SpO2: 92%  93%   Weight:  89.8 kg    Height:  5\' 6"  (1.676 m)      Constitutional: NAD, AAOx3 HEENT: conjunctivae and lids normal, EOMI CV: RRR no M,R,G. Distal pulses +2.  No cyanosis.   RESP: CTA B/L, normal respiratory effort  GI: +BS, NTND Extremities: swelling in right thigh. SKIN: warm, dry and intact Neuro: II - XII grossly intact.  Sensation intact   Labs on Admission: I have personally reviewed following labs and imaging studies  CBC: Recent Labs  Lab 03/26/20 1506  WBC 8.9  NEUTROABS 6.7  HGB 12.6  HCT 36.2  MCV 87.7  PLT 148*   Basic Metabolic Panel: Recent Labs  Lab 03/26/20 1506  NA 142  K 3.7  CL 110  CO2 24  GLUCOSE 166*  BUN  17  CREATININE 0.75  CALCIUM 8.9   GFR: Estimated Creatinine Clearance: 50.6 mL/min (by C-G formula based on SCr of 0.75 mg/dL). Liver Function Tests: No results for input(s): AST, ALT, ALKPHOS, BILITOT, PROT, ALBUMIN in the last 168 hours. No results for input(s): LIPASE, AMYLASE in the last 168 hours. No results for input(s): AMMONIA in the last 168 hours. Coagulation Profile: Recent Labs  Lab 03/26/20 1506  INR 1.1   Cardiac Enzymes: No results for input(s): CKTOTAL, CKMB, CKMBINDEX, TROPONINI in the last 168 hours. BNP (last 3 results) No results for input(s): PROBNP in the last 8760 hours. HbA1C: No results for input(s): HGBA1C in the last 72 hours. CBG: No results for input(s): GLUCAP in the last 168 hours. Lipid Profile: No results for input(s): CHOL, HDL, LDLCALC, TRIG, CHOLHDL, LDLDIRECT in the last 72 hours. Thyroid Function Tests: No results for input(s): TSH, T4TOTAL, FREET4, T3FREE, THYROIDAB in the last 72 hours. Anemia Panel: No results for input(s): VITAMINB12, FOLATE, FERRITIN, TIBC, IRON, RETICCTPCT in the last 72 hours. Urine analysis:    Component Value Date/Time   COLORURINE AMBER (A) 01/23/2018 1351   APPEARANCEUR HAZY (A) 01/23/2018 1351   LABSPEC 1.021 01/23/2018  1351   PHURINE 6.0 01/23/2018 1351   GLUCOSEU NEGATIVE 01/23/2018 1351   HGBUR NEGATIVE 01/23/2018 1351   BILIRUBINUR NEGATIVE 01/23/2018 1351   KETONESUR NEGATIVE 01/23/2018 1351   PROTEINUR NEGATIVE 01/23/2018 1351   NITRITE NEGATIVE 01/23/2018 1351   LEUKOCYTESUR TRACE (A) 01/23/2018 1351    Radiological Exams on Admission: DG Chest 1 View  Result Date: 03/26/2020 CLINICAL DATA:  86 year old female with right femoral fracture. EXAM: CHEST  1 VIEW COMPARISON:  Chest radiograph dated 12/20/2017. FINDINGS: No focal consolidation, pleural effusion, or pneumothorax. Mild chronic interstitial coarsening. There is stable cardiomegaly. Osteopenia. No acute osseous pathology. IMPRESSION: No acute cardiopulmonary process. Electronically Signed   By: Elgie Collard M.D.   On: 03/26/2020 16:28   DG Pelvis 1-2 Views  Result Date: 03/26/2020 CLINICAL DATA:  86 year old female with fall and right hip pain. EXAM: RIGHT FEMUR 2 VIEWS; PELVIS - 1-2 VIEW COMPARISON:  CT abdomen pelvis dated 01/19/2018. FINDINGS: There is a displaced oblique or spiral fracture of the proximal right femur extending from the femoral neck into the proximal femoral diaphysis. There is full shaft width medial displacement of the distal fracture fragment. There is no dislocation. The bones are osteopenic. Vascular calcification as well as calcified fibroid over the left pelvis. The soft tissues are grossly unremarkable. IMPRESSION: Displaced oblique or spiral fracture of the proximal right femur. Electronically Signed   By: Elgie Collard M.D.   On: 03/26/2020 16:28   DG Femur Min 2 Views Right  Result Date: 03/26/2020 CLINICAL DATA:  86 year old female with fall and right hip pain. EXAM: RIGHT FEMUR 2 VIEWS; PELVIS - 1-2 VIEW COMPARISON:  CT abdomen pelvis dated 01/19/2018. FINDINGS: There is a displaced oblique or spiral fracture of the proximal right femur extending from the femoral neck into the proximal femoral diaphysis. There  is full shaft width medial displacement of the distal fracture fragment. There is no dislocation. The bones are osteopenic. Vascular calcification as well as calcified fibroid over the left pelvis. The soft tissues are grossly unremarkable. IMPRESSION: Displaced oblique or spiral fracture of the proximal right femur. Electronically Signed   By: Elgie Collard M.D.   On: 03/26/2020 16:28      Darlin Priestly MD [facility]  If 7PM-7AM, please contact night-coverage 03/26/2020, 7:31 PM

## 2020-03-26 NOTE — ED Notes (Signed)
Patient transported to X-ray 

## 2020-03-27 ENCOUNTER — Inpatient Hospital Stay: Payer: PPO

## 2020-03-27 ENCOUNTER — Inpatient Hospital Stay: Payer: PPO | Admitting: Certified Registered Nurse Anesthetist

## 2020-03-27 ENCOUNTER — Encounter: Payer: Self-pay | Admitting: Hospitalist

## 2020-03-27 ENCOUNTER — Encounter
Admission: EM | Disposition: A | Discharge status: Skilled Nursing Facility | Payer: Self-pay | Source: Home / Self Care | Attending: Pulmonary Disease

## 2020-03-27 ENCOUNTER — Other Ambulatory Visit: Payer: Self-pay

## 2020-03-27 DIAGNOSIS — S72021K Displaced fracture of epiphysis (separation) (upper) of right femur, subsequent encounter for closed fracture with nonunion: Secondary | ICD-10-CM

## 2020-03-27 DIAGNOSIS — I2510 Atherosclerotic heart disease of native coronary artery without angina pectoris: Secondary | ICD-10-CM

## 2020-03-27 DIAGNOSIS — I10 Essential (primary) hypertension: Secondary | ICD-10-CM

## 2020-03-27 HISTORY — PX: INTRAMEDULLARY (IM) NAIL INTERTROCHANTERIC: SHX5875

## 2020-03-27 LAB — CBC WITH DIFFERENTIAL/PLATELET
Abs Immature Granulocytes: 0.1 10*3/uL — ABNORMAL HIGH (ref 0.00–0.07)
Basophils Absolute: 0 10*3/uL (ref 0.0–0.1)
Basophils Relative: 0 %
Eosinophils Absolute: 0 10*3/uL (ref 0.0–0.5)
Eosinophils Relative: 0 %
HCT: 24.6 % — ABNORMAL LOW (ref 36.0–46.0)
Hemoglobin: 8.5 g/dL — ABNORMAL LOW (ref 12.0–15.0)
Immature Granulocytes: 1 %
Lymphocytes Relative: 6 %
Lymphs Abs: 1.1 10*3/uL (ref 0.7–4.0)
MCH: 31.6 pg (ref 26.0–34.0)
MCHC: 34.6 g/dL (ref 30.0–36.0)
MCV: 91.4 fL (ref 80.0–100.0)
Monocytes Absolute: 0.9 10*3/uL (ref 0.1–1.0)
Monocytes Relative: 5 %
Neutro Abs: 15.6 10*3/uL — ABNORMAL HIGH (ref 1.7–7.7)
Neutrophils Relative %: 88 %
Platelets: 85 10*3/uL — ABNORMAL LOW (ref 150–400)
RBC: 2.69 MIL/uL — ABNORMAL LOW (ref 3.87–5.11)
RDW: 14.7 % (ref 11.5–15.5)
WBC: 17.6 10*3/uL — ABNORMAL HIGH (ref 4.0–10.5)
nRBC: 0 % (ref 0.0–0.2)

## 2020-03-27 LAB — COMPREHENSIVE METABOLIC PANEL
ALT: 14 U/L (ref 0–44)
AST: 30 U/L (ref 15–41)
Albumin: 2.3 g/dL — ABNORMAL LOW (ref 3.5–5.0)
Alkaline Phosphatase: 34 U/L — ABNORMAL LOW (ref 38–126)
Anion gap: 4 — ABNORMAL LOW (ref 5–15)
BUN: 24 mg/dL — ABNORMAL HIGH (ref 8–23)
CO2: 26 mmol/L (ref 22–32)
Calcium: 7.5 mg/dL — ABNORMAL LOW (ref 8.9–10.3)
Chloride: 108 mmol/L (ref 98–111)
Creatinine, Ser: 0.82 mg/dL (ref 0.44–1.00)
GFR calc Af Amer: >60 mL/min (ref >60)
GFR calc non Af Amer: >60 mL/min (ref >60)
Glucose, Bld: 189 mg/dL — ABNORMAL HIGH (ref 70–99)
Potassium: 4.5 mmol/L (ref 3.5–5.1)
Sodium: 138 mmol/L (ref 135–145)
Total Bilirubin: 1.4 mg/dL — ABNORMAL HIGH (ref 0.3–1.2)
Total Protein: 4 g/dL — ABNORMAL LOW (ref 6.5–8.1)

## 2020-03-27 LAB — PROTIME-INR
INR: 1.6 — ABNORMAL HIGH (ref 0.8–1.2)
Prothrombin Time: 18.1 seconds — ABNORMAL HIGH (ref 11.4–15.2)

## 2020-03-27 LAB — BASIC METABOLIC PANEL
Anion gap: 8 (ref 5–15)
BUN: 18 mg/dL (ref 8–23)
CO2: 23 mmol/L (ref 22–32)
Calcium: 8.8 mg/dL — ABNORMAL LOW (ref 8.9–10.3)
Chloride: 108 mmol/L (ref 98–111)
Creatinine, Ser: 0.76 mg/dL (ref 0.44–1.00)
GFR calc Af Amer: >60 mL/min (ref >60)
GFR calc non Af Amer: >60 mL/min (ref >60)
Glucose, Bld: 139 mg/dL — ABNORMAL HIGH (ref 70–99)
Potassium: 4.4 mmol/L (ref 3.5–5.1)
Sodium: 139 mmol/L (ref 135–145)

## 2020-03-27 LAB — BLOOD GAS, ARTERIAL
Acid-base deficit: 6.5 mmol/L — ABNORMAL HIGH (ref 0.0–2.0)
Bicarbonate: 20.2 mmol/L (ref 20.0–28.0)
FIO2: 0.4
MECHVT: 450 mL
Mechanical Rate: 15
O2 Saturation: 96.9 %
PEEP: 5 cmH2O
Patient temperature: 37
pCO2 arterial: 45 mmHg (ref 32.0–48.0)
pH, Arterial: 7.26 — ABNORMAL LOW (ref 7.350–7.450)
pO2, Arterial: 102 mmHg (ref 83.0–108.0)

## 2020-03-27 LAB — APTT: aPTT: 29 seconds (ref 24–36)

## 2020-03-27 LAB — CBC
HCT: 32.4 % — ABNORMAL LOW (ref 36.0–46.0)
Hemoglobin: 10.7 g/dL — ABNORMAL LOW (ref 12.0–15.0)
MCH: 30.2 pg (ref 26.0–34.0)
MCHC: 33 g/dL (ref 30.0–36.0)
MCV: 91.5 fL (ref 80.0–100.0)
Platelets: 137 10*3/uL — ABNORMAL LOW (ref 150–400)
RBC: 3.54 MIL/uL — ABNORMAL LOW (ref 3.87–5.11)
RDW: 13.4 % (ref 11.5–15.5)
WBC: 13.5 10*3/uL — ABNORMAL HIGH (ref 4.0–10.5)
nRBC: 0 % (ref 0.0–0.2)

## 2020-03-27 LAB — PROCALCITONIN: Procalcitonin: <0.1 ng/mL

## 2020-03-27 LAB — PREPARE RBC (CROSSMATCH)

## 2020-03-27 LAB — TROPONIN I (HIGH SENSITIVITY): Troponin I (High Sensitivity): 12 ng/L (ref <18)

## 2020-03-27 LAB — SURGICAL PCR SCREEN
MRSA, PCR: NEGATIVE
Staphylococcus aureus: POSITIVE — AB

## 2020-03-27 LAB — GLUCOSE, CAPILLARY
Glucose-Capillary: 120 mg/dL — ABNORMAL HIGH (ref 70–99)
Glucose-Capillary: 202 mg/dL — ABNORMAL HIGH (ref 70–99)
Glucose-Capillary: 149 mg/dL — ABNORMAL HIGH (ref 70–99)

## 2020-03-27 LAB — MAGNESIUM: Magnesium: 1.9 mg/dL (ref 1.7–2.4)

## 2020-03-27 LAB — HEMOGLOBIN: Hemoglobin: 9 g/dL — ABNORMAL LOW (ref 12.0–15.0)

## 2020-03-27 LAB — LACTIC ACID, PLASMA: Lactic Acid, Venous: 2.9 mmol/L (ref 0.5–1.9)

## 2020-03-27 SURGERY — FIXATION, FRACTURE, INTERTROCHANTERIC, WITH INTRAMEDULLARY ROD
Anesthesia: General | Laterality: Right

## 2020-03-27 MED ORDER — FENTANYL CITRATE (PF) 100 MCG/2ML IJ SOLN
INTRAMUSCULAR | Status: DC | PRN
  Administered 2020-03-27 (×2): 50 ug via INTRAVENOUS

## 2020-03-27 MED ORDER — ACETAMINOPHEN 10 MG/ML IV SOLN
INTRAVENOUS | Status: DC | PRN
  Administered 2020-03-27: 1000 mg via INTRAVENOUS

## 2020-03-27 MED ORDER — FENTANYL CITRATE (PF) 100 MCG/2ML IJ SOLN: 25.0000 ug | Freq: Once | INTRAMUSCULAR | Status: DC

## 2020-03-27 MED ORDER — ROCURONIUM BROMIDE 100 MG/10ML IV SOLN
INTRAVENOUS | Status: DC | PRN
  Administered 2020-03-27: 20 mg via INTRAVENOUS
  Administered 2020-03-27: 40 mg via INTRAVENOUS
  Administered 2020-03-27: 10 mg via INTRAVENOUS

## 2020-03-27 MED ORDER — LIDOCAINE HCL (PF) 2 % IJ SOLN
INTRAMUSCULAR | Status: AC
  Filled 2020-03-27: qty 5

## 2020-03-27 MED ORDER — SODIUM CHLORIDE 0.9 % IV SOLN
0.0000 ug/min | INTRAVENOUS | Status: DC
  Administered 2020-03-27: 50 ug/min via INTRAVENOUS
  Filled 2020-03-27: qty 1
  Filled 2020-03-27: qty 10

## 2020-03-27 MED ORDER — BUPIVACAINE HCL (PF) 0.25 % IJ SOLN
INTRAMUSCULAR | Status: AC
  Filled 2020-03-27: qty 30

## 2020-03-27 MED ORDER — SODIUM CHLORIDE (PF) 0.9 % IJ SOLN
INTRAMUSCULAR | Status: AC
  Filled 2020-03-27: qty 10

## 2020-03-27 MED ORDER — HYDROCORTISONE NA SUCCINATE PF 100 MG IJ SOLR
50.0000 mg | Freq: Four times a day (QID) | INTRAMUSCULAR | Status: DC
  Administered 2020-03-27 – 2020-03-30 (×11): 50 mg via INTRAVENOUS
  Filled 2020-03-27 (×11): qty 2

## 2020-03-27 MED ORDER — LIDOCAINE HCL (CARDIAC) PF 100 MG/5ML IV SOSY
PREFILLED_SYRINGE | INTRAVENOUS | Status: DC | PRN
  Administered 2020-03-27: 100 mg via INTRAVENOUS

## 2020-03-27 MED ORDER — ACETAMINOPHEN 10 MG/ML IV SOLN
INTRAVENOUS | Status: AC
  Filled 2020-03-27: qty 100

## 2020-03-27 MED ORDER — VASOPRESSIN 20 UNIT/ML IV SOLN
INTRAVENOUS | Status: DC | PRN
  Administered 2020-03-27: 5 [IU] via INTRAVENOUS
  Administered 2020-03-27: 1 [IU] via INTRAVENOUS
  Administered 2020-03-27: 3 [IU] via INTRAVENOUS
  Administered 2020-03-27 (×2): 1 [IU] via INTRAVENOUS
  Administered 2020-03-27 (×2): 3 [IU] via INTRAVENOUS
  Administered 2020-03-27: 1 [IU] via INTRAVENOUS
  Administered 2020-03-27: 3 [IU] via INTRAVENOUS
  Administered 2020-03-27 (×2): 4 [IU] via INTRAVENOUS
  Administered 2020-03-27: 2 [IU] via INTRAVENOUS
  Administered 2020-03-27: 1 [IU] via INTRAVENOUS
  Administered 2020-03-27: 5 [IU] via INTRAVENOUS
  Administered 2020-03-27: 3 [IU] via INTRAVENOUS

## 2020-03-27 MED ORDER — SODIUM CHLORIDE 0.9 % IV SOLN: 10.0000 mL/h | Freq: Once | INTRAVENOUS | Status: AC

## 2020-03-27 MED ORDER — FENTANYL 2500MCG IN NS 250ML (10MCG/ML) PREMIX INFUSION
25.0000 ug/h | INTRAVENOUS | Status: DC
  Administered 2020-03-27: 25 ug/h via INTRAVENOUS
  Administered 2020-03-28: 100 ug/h via INTRAVENOUS
  Filled 2020-03-27: qty 250

## 2020-03-27 MED ORDER — PHENYLEPHRINE CONCENTRATED 100MG/250ML (0.4 MG/ML) INFUSION SIMPLE
0.0000 ug/min | INTRAVENOUS | Status: DC
  Administered 2020-03-27: 150 ug/min via INTRAVENOUS
  Administered 2020-03-28: 230 ug/min via INTRAVENOUS
  Filled 2020-03-27 (×4): qty 250

## 2020-03-27 MED ORDER — CHLORHEXIDINE GLUCONATE CLOTH 2 % EX PADS
6.0000 | MEDICATED_PAD | Freq: Once | CUTANEOUS | Status: AC
  Administered 2020-03-27: 6 via TOPICAL

## 2020-03-27 MED ORDER — MIDAZOLAM HCL 2 MG/2ML IJ SOLN
1.0000 mg | INTRAMUSCULAR | Status: DC | PRN
  Filled 2020-03-27 (×2): qty 2

## 2020-03-27 MED ORDER — FENTANYL BOLUS VIA INFUSION
25.0000 ug | INTRAVENOUS | Status: DC | PRN
  Filled 2020-03-27: qty 25

## 2020-03-27 MED ORDER — KETAMINE HCL 10 MG/ML IJ SOLN
INTRAMUSCULAR | Status: DC | PRN
  Administered 2020-03-27 (×2): 10 mg via INTRAVENOUS
  Administered 2020-03-27: 20 mg via INTRAVENOUS

## 2020-03-27 MED ORDER — FAMOTIDINE IN NACL 20-0.9 MG/50ML-% IV SOLN
20.0000 mg | Freq: Two times a day (BID) | INTRAVENOUS | Status: DC
  Administered 2020-03-27 – 2020-03-30 (×6): 20 mg via INTRAVENOUS
  Filled 2020-03-27 (×6): qty 50

## 2020-03-27 MED ORDER — VASOPRESSIN 20 UNIT/ML IV SOLN
INTRAVENOUS | Status: AC
  Filled 2020-03-27: qty 1

## 2020-03-27 MED ORDER — DEXAMETHASONE SODIUM PHOSPHATE 10 MG/ML IJ SOLN
INTRAMUSCULAR | Status: AC
  Filled 2020-03-27: qty 1

## 2020-03-27 MED ORDER — PROPOFOL 500 MG/50ML IV EMUL
INTRAVENOUS | Status: AC
  Filled 2020-03-27: qty 50

## 2020-03-27 MED ORDER — KETAMINE HCL 50 MG/ML IJ SOLN
INTRAMUSCULAR | Status: AC
  Filled 2020-03-27: qty 10

## 2020-03-27 MED ORDER — IPRATROPIUM-ALBUTEROL 0.5-2.5 (3) MG/3ML IN SOLN: 3.0000 mL | RESPIRATORY_TRACT | Status: DC | PRN

## 2020-03-27 MED ORDER — SODIUM CHLORIDE 0.45 % IV SOLN: INTRAVENOUS | Status: DC

## 2020-03-27 MED ORDER — DEXAMETHASONE SODIUM PHOSPHATE 10 MG/ML IJ SOLN
INTRAMUSCULAR | Status: DC | PRN
  Administered 2020-03-27: 6 mg via INTRAVENOUS

## 2020-03-27 MED ORDER — ROCURONIUM BROMIDE 10 MG/ML (PF) SYRINGE
PREFILLED_SYRINGE | INTRAVENOUS | Status: AC
  Filled 2020-03-27: qty 10

## 2020-03-27 MED ORDER — METOPROLOL TARTRATE 5 MG/5ML IV SOLN
INTRAVENOUS | Status: DC | PRN
Start: 2020-03-27 — End: 2020-03-27
  Administered 2020-03-27 (×2): 1 mg via INTRAVENOUS

## 2020-03-27 MED ORDER — VASOPRESSIN 20 UNITS/100 ML INFUSION FOR SHOCK
0.0000 [IU]/min | INTRAVENOUS | Status: DC
  Administered 2020-03-27 – 2020-03-29 (×4): 0.03 [IU]/min via INTRAVENOUS
  Filled 2020-03-27 (×4): qty 100

## 2020-03-27 MED ORDER — DOCUSATE SODIUM 50 MG/5ML PO LIQD
100.0000 mg | Freq: Two times a day (BID) | ORAL | Status: DC
  Administered 2020-03-28 – 2020-03-30 (×4): 100 mg via ORAL
  Filled 2020-03-27 (×5): qty 10

## 2020-03-27 MED ORDER — ALBUMIN HUMAN 5 % IV SOLN
INTRAVENOUS | Status: DC | PRN
Start: 2020-03-27 — End: 2020-03-27

## 2020-03-27 MED ORDER — SODIUM CHLORIDE 0.9 % IR SOLN
Status: DC | PRN
  Administered 2020-03-27: 100 mL

## 2020-03-27 MED ORDER — CEFAZOLIN SODIUM-DEXTROSE 2-4 GM/100ML-% IV SOLN
INTRAVENOUS | Status: AC
  Filled 2020-03-27: qty 100

## 2020-03-27 MED ORDER — SODIUM CHLORIDE 0.9 % IV SOLN
INTRAVENOUS | Status: DC | PRN
  Administered 2020-03-27: 50 ug/min via INTRAVENOUS

## 2020-03-27 MED ORDER — SUGAMMADEX SODIUM 200 MG/2ML IV SOLN
INTRAVENOUS | Status: DC | PRN
  Administered 2020-03-27: 200 mg via INTRAVENOUS

## 2020-03-27 MED ORDER — PHENYLEPHRINE HCL (PRESSORS) 10 MG/ML IV SOLN
INTRAVENOUS | Status: DC | PRN
  Administered 2020-03-27: 300 ug via INTRAVENOUS
  Administered 2020-03-27: 200 ug via INTRAVENOUS
  Administered 2020-03-27 (×2): 300 ug via INTRAVENOUS
  Administered 2020-03-27: 200 ug via INTRAVENOUS

## 2020-03-27 MED ORDER — CHLORHEXIDINE GLUCONATE CLOTH 2 % EX PADS: 6.0000 | MEDICATED_PAD | Freq: Every day | CUTANEOUS | Status: DC

## 2020-03-27 MED ORDER — ONDANSETRON HCL 4 MG/2ML IJ SOLN
INTRAMUSCULAR | Status: DC | PRN
  Administered 2020-03-27: 4 mg via INTRAVENOUS

## 2020-03-27 MED ORDER — CEFAZOLIN SODIUM-DEXTROSE 1-4 GM/50ML-% IV SOLN
1.0000 g | Freq: Three times a day (TID) | INTRAVENOUS | Status: DC
  Administered 2020-03-27 – 2020-03-31 (×11): 1 g via INTRAVENOUS
  Filled 2020-03-27 (×15): qty 50

## 2020-03-27 MED ORDER — ESMOLOL HCL 100 MG/10ML IV SOLN
INTRAVENOUS | Status: AC
  Filled 2020-03-27: qty 10

## 2020-03-27 MED ORDER — LACTATED RINGERS IV SOLN: INTRAVENOUS | Status: DC

## 2020-03-27 MED ORDER — POLYETHYLENE GLYCOL 3350 17 G PO PACK
17.0000 g | PACK | Freq: Every day | ORAL | Status: DC
  Administered 2020-03-29 – 2020-04-02 (×5): 17 g via ORAL
  Filled 2020-03-27 (×6): qty 1

## 2020-03-27 MED ORDER — PROPOFOL 10 MG/ML IV BOLUS
INTRAVENOUS | Status: AC
  Filled 2020-03-27: qty 20

## 2020-03-27 MED ORDER — CHLORHEXIDINE GLUCONATE CLOTH 2 % EX PADS
6.0000 | MEDICATED_PAD | Freq: Every day | CUTANEOUS | Status: DC
  Administered 2020-03-28 – 2020-04-01 (×4): 6 via TOPICAL

## 2020-03-27 MED ORDER — FENTANYL CITRATE (PF) 100 MCG/2ML IJ SOLN
INTRAMUSCULAR | Status: AC
  Filled 2020-03-27: qty 2

## 2020-03-27 MED ORDER — LACTATED RINGERS IV BOLUS
1000.0000 mL | Freq: Once | INTRAVENOUS | Status: AC
  Administered 2020-03-28: 1000 mL via INTRAVENOUS

## 2020-03-27 MED ORDER — ONDANSETRON HCL 4 MG/2ML IJ SOLN
INTRAMUSCULAR | Status: AC
  Filled 2020-03-27: qty 2

## 2020-03-27 MED ORDER — LACTATED RINGERS IV BOLUS
1000.0000 mL | Freq: Once | INTRAVENOUS | Status: AC
  Administered 2020-03-27: 1000 mL via INTRAVENOUS

## 2020-03-27 MED ORDER — BUPIVACAINE HCL (PF) 0.25 % IJ SOLN
INTRAMUSCULAR | Status: DC | PRN
  Administered 2020-03-27: 30 mL

## 2020-03-27 MED ORDER — AMIODARONE IV BOLUS ONLY 150 MG/100ML
150.0000 mg | Freq: Once | INTRAVENOUS | Status: AC
  Administered 2020-03-28: 150 mg via INTRAVENOUS
  Filled 2020-03-27: qty 100

## 2020-03-27 MED ORDER — METOPROLOL TARTRATE 5 MG/5ML IV SOLN
INTRAVENOUS | Status: AC
  Filled 2020-03-27: qty 5

## 2020-03-27 MED ORDER — PROPOFOL 10 MG/ML IV BOLUS
INTRAVENOUS | Status: DC | PRN
  Administered 2020-03-27: 120 mg via INTRAVENOUS

## 2020-03-27 MED ORDER — MIDAZOLAM HCL 2 MG/2ML IJ SOLN
1.0000 mg | INTRAMUSCULAR | Status: DC | PRN
  Administered 2020-03-27 (×2): 1 mg via INTRAVENOUS

## 2020-03-27 MED ORDER — INSULIN ASPART 100 UNIT/ML ~~LOC~~ SOLN
0.0000 [IU] | SUBCUTANEOUS | Status: DC
  Administered 2020-03-27 – 2020-03-28 (×2): 2 [IU] via SUBCUTANEOUS
  Administered 2020-03-28: 1 [IU] via SUBCUTANEOUS
  Administered 2020-03-28 (×2): 3 [IU] via SUBCUTANEOUS
  Administered 2020-03-28 – 2020-03-29 (×2): 2 [IU] via SUBCUTANEOUS
  Administered 2020-03-29: 3 [IU] via SUBCUTANEOUS
  Administered 2020-03-29 – 2020-03-30 (×2): 2 [IU] via SUBCUTANEOUS
  Administered 2020-03-30: 3 [IU] via SUBCUTANEOUS
  Administered 2020-03-30 (×2): 2 [IU] via SUBCUTANEOUS
  Administered 2020-03-31: 5 [IU] via SUBCUTANEOUS
  Administered 2020-03-31: 3 [IU] via SUBCUTANEOUS
  Administered 2020-03-31 – 2020-04-02 (×2): 2 [IU] via SUBCUTANEOUS
  Filled 2020-03-27 (×15): qty 1

## 2020-03-27 MED ORDER — DEXMEDETOMIDINE HCL IN NACL 80 MCG/20ML IV SOLN
INTRAVENOUS | Status: AC
  Filled 2020-03-27: qty 20

## 2020-03-27 MED ORDER — ALBUMIN HUMAN 5 % IV SOLN
INTRAVENOUS | Status: AC
  Filled 2020-03-27: qty 250

## 2020-03-27 SURGICAL SUPPLY — 53 items
BIT DRILL 4.3MMS DISTAL GRDTED (BIT) ×1 IMPLANT
BNDG COHESIVE 6X5 TAN STRL LF (GAUZE/BANDAGES/DRESSINGS) ×4 IMPLANT
CABLE CERLAGE W/CRIMP 1.8 (Cable) ×4 IMPLANT
CANISTER SUCT 1200ML W/VALVE (MISCELLANEOUS) ×2 IMPLANT
CORTICAL BONE SCR 5.0MM X 46MM (Screw) ×2 IMPLANT
COVER WAND RF STERILE (DRAPES) ×2 IMPLANT
DRAPE 3/4 80X56 (DRAPES) ×4 IMPLANT
DRAPE C-ARMOR (DRAPES) ×2 IMPLANT
DRAPE SURG 17X11 SM STRL (DRAPES) ×4 IMPLANT
DRAPE U-SHAPE 47X51 STRL (DRAPES) ×2 IMPLANT
DRESSING SURGICEL FIBRLLR 1X2 (HEMOSTASIS) ×2 IMPLANT
DRILL 4.3MMS DISTAL GRADUATED (BIT) ×2
DRSG OPSITE POSTOP 3X4 (GAUZE/BANDAGES/DRESSINGS) ×2 IMPLANT
DRSG OPSITE POSTOP 4X14 (GAUZE/BANDAGES/DRESSINGS) IMPLANT
DRSG OPSITE POSTOP 4X6 (GAUZE/BANDAGES/DRESSINGS) IMPLANT
DRSG OPSITE POSTOP 4X8 (GAUZE/BANDAGES/DRESSINGS) ×4 IMPLANT
DRSG SURGICEL FIBRILLAR 1X2 (HEMOSTASIS) ×4
DURAPREP 26ML APPLICATOR (WOUND CARE) ×4 IMPLANT
ELECT REM PT RETURN 9FT ADLT (ELECTROSURGICAL) ×2
ELECTRODE REM PT RTRN 9FT ADLT (ELECTROSURGICAL) ×1 IMPLANT
GLOVE BIOGEL PI IND STRL 9 (GLOVE) ×1 IMPLANT
GLOVE BIOGEL PI INDICATOR 9 (GLOVE) ×1
GLOVE SURG 9.0 ORTHO LTXF (GLOVE) ×4 IMPLANT
GOWN STRL REUS TWL 2XL XL LVL4 (GOWN DISPOSABLE) ×2 IMPLANT
GOWN STRL REUS W/ TWL LRG LVL3 (GOWN DISPOSABLE) ×1 IMPLANT
GOWN STRL REUS W/TWL LRG LVL3 (GOWN DISPOSABLE) ×1
GUIDEPIN VERSANAIL DSP 3.2X444 (ORTHOPEDIC DISPOSABLE SUPPLIES) ×2 IMPLANT
GUIDEWIRE BALL NOSE 100CM (WIRE) ×2 IMPLANT
HEMOVAC 400CC 10FR (MISCELLANEOUS) IMPLANT
HIP FRAC NAIL LAG SCR 10.5X100 (Orthopedic Implant) ×1 IMPLANT
KIT TURNOVER CYSTO (KITS) ×2 IMPLANT
MAT ABSORB  FLUID 56X50 GRAY (MISCELLANEOUS) ×1
MAT ABSORB FLUID 56X50 GRAY (MISCELLANEOUS) ×1 IMPLANT
NAIL HIP FRACTURE 11X380MM (Nail) ×2 IMPLANT
NS IRRIG 1000ML POUR BTL (IV SOLUTION) ×2 IMPLANT
PACK HIP COMPR (MISCELLANEOUS) ×2 IMPLANT
PAD ARMBOARD 7.5X6 YLW CONV (MISCELLANEOUS) ×2 IMPLANT
PADDING CAST 4IN STRL (MISCELLANEOUS) ×1
PADDING CAST BLEND 4X4 STRL (MISCELLANEOUS) ×1 IMPLANT
SCREW BONE CORTICAL 5.0X50 (Screw) ×2 IMPLANT
SCREW CANN THRD AFF 10.5X100 (Orthopedic Implant) ×1 IMPLANT
SCREW CORTICL BON 5.0MM X 46MM (Screw) ×1 IMPLANT
STAPLER SKIN PROX 35W (STAPLE) ×2 IMPLANT
SUCTION FRAZIER HANDLE 10FR (MISCELLANEOUS) ×1
SUCTION TUBE FRAZIER 10FR DISP (MISCELLANEOUS) ×1 IMPLANT
SUT ETHIBOND NAB CT1 #1 30IN (SUTURE) ×2 IMPLANT
SUT TICRON 2-0 30IN 311381 (SUTURE) ×4 IMPLANT
SUT VIC AB 0 CT1 36 (SUTURE) ×8 IMPLANT
SUT VIC AB 2-0 CT1 27 (SUTURE) ×2
SUT VIC AB 2-0 CT1 TAPERPNT 27 (SUTURE) ×2 IMPLANT
SUT VICRYL 0 AB UR-6 (SUTURE) ×2 IMPLANT
SYR 30ML LL (SYRINGE) ×2 IMPLANT
TOWEL OR 17X26 4PK STRL BLUE (TOWEL DISPOSABLE) ×6 IMPLANT

## 2020-03-27 NOTE — Anesthesia Preprocedure Evaluation (Addendum)
Anesthesia Evaluation  Patient identified by MRN, date of birth, ID band Patient awake    Reviewed: Allergy & Precautions, NPO status , Patient's Chart, lab work & pertinent test results, reviewed documented beta blocker date and time   Airway Mallampati: II  TM Distance: >3 FB     Dental  (+) Partial Lower   Pulmonary shortness of breath and with exertion, sleep apnea ,  Hx of PE in the past   Pulmonary exam normal        Cardiovascular hypertension, Pt. on medications and Pt. on home beta blockers + angina + CAD and + Past MI  Normal cardiovascular exam+ dysrhythmias Atrial Fibrillation + Valvular Problems/Murmurs   EF 55% and mild to moderate dysastolic dysfunction.    Neuro/Psych negative neurological ROS  negative psych ROS   GI/Hepatic Neg liver ROS, GERD  ,  Endo/Other  negative endocrine ROS  Renal/GU negative Renal ROS  Female GU complaint     Musculoskeletal negative musculoskeletal ROS (+)   Abdominal Normal abdominal exam  (+)   Peds negative pediatric ROS (+)  Hematology  (+) Blood dyscrasia, anemia ,   Anesthesia Other Findings Past Medical History: No date: Anginal pain (HCC) No date: Dyspnea No date: History of blood clots No date: Hypertension No date: PE (pulmonary embolism) No date: Sleep apnea  Reproductive/Obstetrics                           Anesthesia Physical  Anesthesia Plan  ASA: III  Anesthesia Plan: General   Post-op Pain Management:    Induction: Intravenous  PONV Risk Score and Plan:   Airway Management Planned: Oral ETT  Additional Equipment:   Intra-op Plan:   Post-operative Plan: Extubation in OR  Informed Consent: I have reviewed the patients History and Physical, chart, labs and discussed the procedure including the risks, benefits and alternatives for the proposed anesthesia with the patient or authorized representative who has  indicated his/her understanding and acceptance.     Dental advisory given  Plan Discussed with: CRNA and Surgeon  Anesthesia Plan Comments:         Anesthesia Quick Evaluation

## 2020-03-27 NOTE — Op Note (Signed)
03/27/2020  8:18 PM  PATIENT:  Alison Russo    PRE-OPERATIVE DIAGNOSIS:  Right displaced femoral shaft fracture  POST-OPERATIVE DIAGNOSIS:  Same  PROCEDURE:  INTRAMEDULLARY FIXATION OF RIGHT FEMORAL SHAFT FRACTURE WITH CERCLAGE CABLE  SURGEON:  Thornton Park, MD  ANESTHESIA:   GENERAL  EBL:  1200 cc.   Patient received 2 units of PRBCs intraoperatively.  IMPLANT:  ZIMMER BIOMET AFFIXUS NAIL 66m x 3834mwith a 10027mag screw and distal interlocking screws 75m25mnd 46 mm in length.  PREOPERATIVE INDICATIONS:  Alison Russo  89 y42. female with a diagnosis of displaced Right Femur Fracture after a fall in her driveway at home yesterday.  [facility] a long oblique displaced fracture of the right femoral shaft.  I recommended intramedullary fixation for this fracture to the patient and her daughter in the emergency room last night.  I discussed the details of the operation as well as the postoperative course.  The risks, benefits and alternatives were discussed with the patient and her daughter as well.  The risks include but are not limited to infection, bleeding requiring blood transfusion, nerve or blood vessel injury, malunion, nonunion, hardware prominence, hardware failure, leg length discrepancy or change in lower extremity rotation and need for further surgery. Medical risks include but are not limited to DVT and pulmonary embolism, myocardial infarction, stroke, pneumonia, respiratory failure and death. The patient and her daughter agreed with the plan for surgery.  OPERATIVE PROCEDURE:  The patient was met in the preoperative area where her right thigh was marked with the word yes and my initials according the hospital's correct site of surgery protocol.  She was then brought to the operating room and placed in the supine position on the fracture table. The patient received general anesthesia.  Patient was prepped and  draped in a sterile fashion.  A time out was performed to verify the patient's name, date of birth, medical record number, correct site of surgery correct procedure to be performed. The timeout was also used to verify the patient received antibiotics and all appropriate instruments, implants and radiographic studies were available in the room. Once all in attendance were in agreement, the case began.  The patient received preoperative antibiotics with 2 g of Ancef IV.  An incision was made over the lateral thigh to access the fracture.  Given the displacement and instability of the fracture the fracture had to be open reduced and held into place with a turk[place]w fracture clamp.  A longitudinal incision was made through the IT band.  An initial incision into the vastus lateralis was made with a deep #10 blade and expanded with manual blunt dissection.  A large hematoma was evacuated from the fracture site.  A second incision, proximal to the greater trochanter in line with the femur, was made.  A guidewire was placed on the tip of the greater trochanter and advanced by drill into the proximal femur to the level of the lesser trochanter.  Confirmation of the drill pin position was made on AP and lateral C-arm images.  The threaded guidepin was then overdrilled with the proximal femoral entry reamer.  An intramedullary reduction tool was used to cross the fracture site.  A ball-tipped guidewire was then advanced through this cannulated intramedullary reduction tool across the fracture to the knee.  The ball tip guidewire's position was confirmed at both the knee and hip via C-arm imaging. A  depth gauge was used to measure the length of the long nail to be used. It was measured to be 380 mm in length.  Sequential reamers were used up to 13 mm to allow for placement of an 11 mm diameter nail.  A [employer] cable was placed around the fracture using a cable passer device.  This was tensioned to bring the fracture  into anatomic reduction and locked into position.  The actual intramedullary nail was then inserted into the proximal femur, across the fracture site and down the femoral shaft. Its position was confirmed on AP and lateral C-arm images.  The ball tip guidewire was removed.  Once the nail was completely seated, the drill guide for the lag screw was placed through the guide arm for the Affixus nail. A guidepin was then placed through this drill guide and advanced through the lateral cortex of the femur, across the fracture site and into the femoral head achieving a tip apex distance of less than 25 mm. The length of the drill pin was measured to be 100 mm in length.  The drill for the lag screw was advanced through the lateral cortex, across the fracture site and up into the femoral head to the depth of the lag screw.  The lag screw was then advanced by hand into position into the femoral head. Its final position was confirmed on AP and lateral C-arm images. The set screw in the top of the intramedullary rod was tightened by hand using a screwdriver.  The attention was then turned to placement of the distal interlocking screws. A perfect circle technique was used. 2 small stab incisions were made over the distal interlocking screw holes.  A free hand technique was used to drill both distal interlocking screws. The depth of the screw holes was measured with a depth gauge. The 38m and 46 mm screws were then advanced into position and tightened by hand. Final C-arm images of the entire intramedullary construct were taken in both the AP and lateral planes.   The wounds were irrigated copiously.  Surgicel x 2 were placed in the initial incision used to expose the fracture, after packing due to persistent venous oozing from the vastus lateralis and fracture site.  The wound was closed in several layers with with 0 Vicryl to close the vastus lateralis fascia.  #2 TBasilio Cairowas used to partially close the IT band.  Given  the swelling in the vastus lateralis the IT band could not be completely closed.  The subcutaneous tissue was then closed in several layers with 0 Vicryl and 2-0 Vicryl.  The proximal incision was closed with 0 Vicryl for the deep fascia, as well as 0 and 2-0 Vicryl for the subcutaneous tissue.  The skin for all incisions was approximated with staples.  The distal stab incisions were closed with skin staples.  Honeycomb dressings were applied over all the incisions along with a compressive dressing to reduce venous bleeding. I was scrubbed and present the entire case and all sharp, sponge and instrument counts were correct at the conclusion of the case. Patient was transferred to the ICU and remained intubated postoperatively for cardiovascular monitoring.  Patient received 2 units of packed red blood cells during the case due to blood loss.  Her intraoperative hemoglobin was 9.     KTimoteo Gaul MD

## 2020-03-27 NOTE — Transfer of Care (Signed)
Immediate Anesthesia Transfer of Care Note  Patient: Alison Russo  Procedure(s) Performed: INTRAMEDULLARY (IM) NAIL INTERTROCHANTRIC (Right )  Patient Location: ICU  Anesthesia Type:General  Level of Consciousness: responds to stimulation and Patient remains intubated per anesthesia plan  Airway & Oxygen Therapy: Patient Spontanous Breathing, Patient remains intubated per anesthesia plan and Patient placed on Ventilator (see vital sign flow sheet for setting)  Post-op Assessment: Report given to RN and Post -op Vital signs reviewed and stable  Post vital signs: Reviewed and stable  Last Vitals:  Vitals Value Taken Time  BP 91/55   Temp 97 F   Pulse 114 03/27/20 1911  Resp 23 03/27/20 1914  SpO2 78 % 03/27/20 1911  Vitals shown include unvalidated device data.  Last Pain:  Vitals:   03/27/20 1428  TempSrc:   PainSc: 0-No pain         Complications: No complications documented.

## 2020-03-27 NOTE — Consult Note (Addendum)
Name: Alison Russo MRN: [id number] DOB: June 13, 1926    ADMISSION DATE:  03/26/2020 CONSULTATION DATE:  03/27/2020  REFERRING MD :  Dr. Seth Bake  CHIEF COMPLAINT:  Post-op Ventilator Management, Shock  BRIEF PATIENT DESCRIPTION:  86 y.o. Female admitted 03/26/20 with Right displaced Femoral Shaft Fracture.  She underwent Intramedullary Fixation of the right femoral artery shaft fracture on 03/27/20. Intraoperatively she was noted to have persistent venous oozing from the vastus lateralis and fracture site, with approximately 1200 cc of blood loss.  She was given 2 units pRBC's intraoperatively. She returns to ICU post procedure and remains intubated.  Upon arrival to ICU noted to have severe shock and atrial fibrillation with RVR.  SIGNIFICANT EVENTS  7/7: Admission to [facility] 7/8: Underwent Intramedullary Fixation of fracture  7/8: Returns to ICU post procedure and remains intubated; with severe shock and A-fib w/ RVR 7/8: Right IJ CVC placed  STUDIES:  7/8: Right Femur X-ray>>Post ORIF mid-proximal femur fracture without immediate postoperative complication. 7/8: CXR>>1. Endotracheal tube tip 5.6 cm in the carina. 2. Right internal jugular central venous catheter tip projects over the upper SVC. No pneumothorax. 3. Low lung volumes. Similar cardiomegaly. Possible small pleural effusions.  CULTURES: SARS-CoV-2 PCR 7/7>> Negative Blood culture x2 7/8>> Urine 7/8>>  ANTIBIOTICS: Cefazolin 7/8>>  HISTORY OF PRESENT ILLNESS:   Alison Russo is a 86 y.o. Female with a past medical history significant for CAD, NSTEMI s/p stents, and Hypertension who presented to [facility] ED on 03/26/20 with right leg pain following an accidental fall.  She reported she was walking outside and missed a step and fell onto her right side, with subsequent pain in her right leg.  She denied chest pain, SOB, abdominal pain, fever, chills, N/V/D, or dysuria.  Upon presentation to the ED her vitals were HR 110,  BP 145/110, O2 saturations 92% on room air.  Lab work was unremarkable.  Xray showed a displaced oblique or spiral fracture of the proximal right femur.  Ortho was contacted by ED provider.  Hospitalist were asked to admit the patient for further workup and treatment of closed displaced fracture of the right femoral shaft.  On 03/27/20 she underwent Intramedullary fixation of the right femoral shaft fracture.  Intraoperatively she was noted to have persistent venous oozing from the vastus lateralis and fracture site, with approximately 1200 cc of blood loss.  She was given 2 units pRBC's intraoperatively.  Post procedure she was transferred to ICU and remains intubated.  Upon arrival to ICU she was noted to be in Atrial fibrillation with RVR and severely hypotensive requiring IV fluids and vasopressors.  PCCM is consulted for post-op ventilator management and severe shock.  Suspect component of hemorrhagic shock and cardiogenic shock.  PAST MEDICAL HISTORY :   has a past medical history of Anginal pain ([place]), CAD (coronary artery disease), Heart murmur, Hypertension, Mood disorder ([place]), NSTEMI (non-ST elevated myocardial infarction) ([place]) (12/22/2017), OSA on CPAP, and PE (pulmonary embolism).  has a past surgical history that includes Tonsillectomy; Cardiac catheterization; Colonoscopy; Hysteroscopy with D & C (N/A, 10/25/2016); LEFT HEART CATH AND CORONARY ANGIOGRAPHY (N/A, 12/21/2017); CORONARY STENT INTERVENTION (N/A, 12/23/2017); Inguinal hernia repair (Right, 1950s); Breast cyst aspiration (Right); Eye surgery (Left); Eye surgery (Right); Cataract extraction, bilateral; Lower Extremity Angiography (Right, 01/11/2018); and PSEUDOANERYSM COMPRESSION (Left, 01/20/2018). Prior to Admission medications   Medication Sig Start Date End Date Taking? Authorizing Provider  atorvastatin (LIPITOR) 80 MG tablet TAKE 1 TABLET (80 MG TOTAL) BY MOUTH DAILY AT 6  PM. 03/03/18  Yes Lavera Guise, MD  clopidogrel (PLAVIX) 75 MG  tablet Take 1 tablet (75 mg total) by mouth daily with breakfast. 12/26/17  Yes Theora Gianotti, NP  metoprolol tartrate (LOPRESSOR) 25 MG tablet Take 0.5 tablets (12.5 mg total) by mouth 2 (two) times daily. 03/20/18  Yes Boscia, Greer Ee, NP  Multiple Vitamins-Minerals (PRESERVISION AREDS 2+MULTI VIT PO) Take 1 tablet by mouth 2 (two) times daily.   Yes [provider]  sertraline (ZOLOFT) 100 MG tablet Take 1 tablet (100 mg total) by mouth daily. 03/20/18  Yes Boscia, Greer Ee, NP  acetaminophen (TYLENOL) 325 MG tablet Take 2 tablets (650 mg total) by mouth every 6 (six) hours as needed for mild pain (or Fever >/= 101). 01/23/18   Gladstone Lighter, MD  feeding supplement, ENSURE ENLIVE, (ENSURE ENLIVE) LIQD Take 237 mLs by mouth 2 (two) times daily between meals. 01/24/18   Gladstone Lighter, MD  Naphazoline-Pheniramine (OPCON-A OP) Place 1-2 drops into both eyes 3 (three) times daily as needed (for itchy/irritated eyes.).    [provider]  pantoprazole (PROTONIX) 40 MG tablet TAKE 1 TABLET BY MOUTH EVERY DAY Patient not taking: Reported on 03/26/2020 02/06/18   Ronnell Freshwater, NP  polyethylene glycol (MIRALAX / GLYCOLAX) packet Take 17 g by mouth daily. Hold if >2 bowel movements /day 01/23/18   Gladstone Lighter, MD  traMADol (ULTRAM) 50 MG tablet Take 1 tablet (50 mg total) by mouth every 6 (six) hours as needed for moderate pain. Patient not taking: Reported on 09/06/2019 01/23/18   Gladstone Lighter, MD  traZODone (DESYREL) 50 MG tablet Take 0.5 tablets (25 mg total) by mouth at bedtime as needed for sleep. Patient not taking: Reported on 03/26/2020 12/22/17   Vaughan Basta, MD   No Known Allergies  FAMILY HISTORY:  family history includes AAA (abdominal aortic aneurysm) in her father; Breast cancer (age of onset: 20) in her mother; Coronary artery disease in her brother. SOCIAL HISTORY:  reports that she has never smoked. She has never used smokeless tobacco.  She reports that she does not drink alcohol and does not use drugs.   COVID-19 DISASTER DECLARATION:  FULL CONTACT PHYSICAL EXAMINATION WAS NOT POSSIBLE DUE TO TREATMENT OF COVID-19 AND  CONSERVATION OF PERSONAL PROTECTIVE EQUIPMENT, LIMITED EXAM FINDINGS INCLUDE-  Patient assessed or the symptoms described in the history of present illness.  In the context of the Global COVID-19 pandemic, which necessitated consideration that the patient might be at risk for infection with the SARS-CoV-2 virus that causes COVID-19, Institutional protocols and algorithms that pertain to the evaluation of patients at risk for COVID-19 are in a state of rapid change based on information released by regulatory bodies including the CDC and federal and state organizations. These policies and algorithms were followed during the patient's care while in hospital.  REVIEW OF SYSTEMS:   Unable to assess due to sedation and intubation  SUBJECTIVE:  Unable to assess due to sedation and intubation  VITAL SIGNS: Temp:  [97.5 F (36.4 C)-98.3 F (36.8 C)] 98.3 F (36.8 C) (07/08 1428) Pulse Rate:  [74-110] 105 (07/08 1428) Resp:  [16-20] 16 (07/08 1428) BP: (102-134)/(66-87) 115/75 (07/08 1428) SpO2:  [93 %-100 %] 100 % (07/08 1922) FiO2 (%):  [40 %] 40 % (07/08 1922) Weight:  [89.8 kg] 89.8 kg (07/08 1428)  PHYSICAL EXAMINATION: General: Acutely ill-appearing female, laying in bed, intubated and sedated, no acute distress Neuro: Sedated, opens eyes and withdraws from pain, pupils  PERRLA HEENT: Atraumatic, normocephalic, neck supple, no JVD Cardiovascular: Irregular irregular rhythm, tachycardia, no murmurs, rubs, gallops Lungs: Clear to auscultation bilaterally, no wheezing or crackles, ventilator assisted, even Abdomen: Soft, nontender, nondistended, no guarding or rebound tenderness, bowel sounds positive x4 Musculoskeletal: No edema, Skin: Cool and dry.  Surgical incision dressing to the right femur clean  dry intact.  No obvious rashes, lesions, ulcerations  Recent Labs  Lab 03/26/20 1506 03/27/20 0509  NA 142 139  K 3.7 4.4  CL 110 108  CO2 24 23  BUN 17 18  CREATININE 0.75 0.76  GLUCOSE 166* 139*   Recent Labs  Lab 03/26/20 1506 03/27/20 0509 03/27/20 1650  HGB 12.6 10.7* 9.0*  HCT 36.2 32.4*  --   WBC 8.9 13.5*  --   PLT 148* 137*  --    DG Chest 1 View  Result Date: 03/26/2020 CLINICAL DATA:  86 year old female with right femoral fracture. EXAM: CHEST  1 VIEW COMPARISON:  Chest radiograph dated 12/20/2017. FINDINGS: No focal consolidation, pleural effusion, or pneumothorax. Mild chronic interstitial coarsening. There is stable cardiomegaly. Osteopenia. No acute osseous pathology. IMPRESSION: No acute cardiopulmonary process. Electronically Signed   By: Anner Crete M.D.   On: 03/26/2020 16:28   DG Pelvis 1-2 Views  Result Date: 03/26/2020 CLINICAL DATA:  86 year old female with fall and right hip pain. EXAM: RIGHT FEMUR 2 VIEWS; PELVIS - 1-2 VIEW COMPARISON:  CT abdomen pelvis dated 01/19/2018. FINDINGS: There is a displaced oblique or spiral fracture of the proximal right femur extending from the femoral neck into the proximal femoral diaphysis. There is full shaft width medial displacement of the distal fracture fragment. There is no dislocation. The bones are osteopenic. Vascular calcification as well as calcified fibroid over the left pelvis. The soft tissues are grossly unremarkable. IMPRESSION: Displaced oblique or spiral fracture of the proximal right femur. Electronically Signed   By: Anner Crete M.D.   On: 03/26/2020 16:28   DG HIP OPERATIVE UNILAT W OR W/O PELVIS RIGHT  Result Date: 03/27/2020 CLINICAL DATA:  86 year old female with right femoral ORIF. EXAM: OPERATIVE right HIP (WITH PELVIS IF PERFORMED) 2 VIEWS TECHNIQUE: Fluoroscopic spot image(s) were submitted for interpretation post-operatively. COMPARISON:  None. FINDINGS: Eight intraoperative fluoroscopic  spot images provided. The total fluoro time is 2 minutes 7 seconds and total air kerma of 20.6 mGy. Right femoral nail noted which appears intact. IMPRESSION: Status post right femoral nail. Electronically Signed   By: Anner Crete M.D.   On: 03/27/2020 18:28   DG Femur Min 2 Views Right  Result Date: 03/26/2020 CLINICAL DATA:  86 year old female with fall and right hip pain. EXAM: RIGHT FEMUR 2 VIEWS; PELVIS - 1-2 VIEW COMPARISON:  CT abdomen pelvis dated 01/19/2018. FINDINGS: There is a displaced oblique or spiral fracture of the proximal right femur extending from the femoral neck into the proximal femoral diaphysis. There is full shaft width medial displacement of the distal fracture fragment. There is no dislocation. The bones are osteopenic. Vascular calcification as well as calcified fibroid over the left pelvis. The soft tissues are grossly unremarkable. IMPRESSION: Displaced oblique or spiral fracture of the proximal right femur. Electronically Signed   By: Anner Crete M.D.   On: 03/26/2020 16:28    ASSESSMENT / PLAN:  Shock, suspect Hemorrhagic +/- Cardiogenic shock A-fib w/ RVR Hx: HTN, CAD, NSTEMI s/p stents -Continuous cardiac monitoring -Maintain MAP greater than 65 -IV fluids -Vasopressors as needed to maintain MAP goal -Transfusions as indicated -Received  2 units blood intraoperatively -Stress dose steroids -Trend lactic acid -Check troponin ~ 12 -Will give Amiodarone bolus -Cardiology consult, appreciate input -Obtain 2D echocardiogram -Perform sepsis workup   Acute Blood Loss Anemia in setting of intraoperative blood loss (EBL 1200 cc) Thrombocytopenia -Monitor for S/Sx of bleeding -Trend CBC -SCD's for VTE Prophylaxis  -Transfuse for Hgb <8 -Transfuse Platelets for platelet count <50 and active bleeding  Leukocytosis -Monitor fever curve -Trend WBC's & Procalcitonin -Follow cultures as above -CXR without evidence of Pneumonia -Check UA -Procalcitonin  is currently negative, but given severe illness, will place on Cefazolin for now for surgical site prophylaxis   Post-op Ventilator Management -Full vent support -Wean FiO2 and PEEP as tolerated to maintain O2 saturations greater than 92% -Follow intermittent chest x-ray and ABG as needed -VAP protocol -Spontaneous breathing trials when respiratory parameters met -As needed bronchodilators  Sedation needs in setting of mechanical ventilation -Maintain RASS of 0 to -1 -Fentanyl gtt to maintain RASS goal -Avoid sedating medications as able -Daily wake up assessments -Provide supportive care  Displaced fracture of the proximal right femur s/p Intramedullary Fixation on 03/27/20 -Ortho following, will follow recommendations  Hyperglycemia -CBG's -SSI -Follow ICU Hypo/hyperglycemia protocol -Check Hemoglobin A1c       Pt's prognosis is guarded.  She is at high risk for cardiac arrest and death.  BEST PRACTICES DISPOSITION: ICU GOALS OF CARE: Full code VTE PROPHYLAXIS: SCD's (no chemical prophylaxis post-op until cleared by Ortho) STRESS ULCER PROPHYLAXIS: IV Pepcid CONSULTS: Ortho, Cardiology UPDATES: Updated pt's Daughter at bedside 03/27/20    Darel Hong, [facility] [place] Pager: [phone]  03/27/2020, 9:17 PM

## 2020-03-27 NOTE — Progress Notes (Signed)
PROGRESS NOTE    MARKETA Russo  WGY:[id number] DOB: 06-25-1926 DOA: 03/26/2020 PCP: Gracelyn Nurse, MD    Brief Narrative:  Alison Russo is a 86 y.o. female with medical history significant of CAD, NSTEMI s/p stents, HTN who presented with right leg pain after a accidental fall. Pt reported walking outside and missed a step and fell onto her right side, with subsequent severe pain in her right leg.  Prior to the fall, pt was at her baseline.  No recent illness.  No fever, dyspnea, chest pain, abdominal pain, N/V/D, dysuria.Xray showed "Displaced oblique or spiral fracture of the proximal right femur."  Ortho contacted by ED provider.    Consultants:   Orthopedics  Procedures:   Antimicrobials:   cefaxolin x1    Subjective: Some pain, no n/v/cp or sob  Objective: Vitals:   03/27/20 0013 03/27/20 0351 03/27/20 0607 03/27/20 0734  BP: 130/87 102/73 110/73 115/76  Pulse: 98 (!) 110 (!) 104 74  Resp: 20 18  18   Temp: (!) 97.5 F (36.4 C) 98.2 F (36.8 C)  97.6 F (36.4 C)  TempSrc: Oral   Oral  SpO2: 99% 98% 94% 97%  Weight:      Height:        Intake/Output Summary (Last 24 hours) at 03/27/2020 0759 Last data filed at 03/26/2020 1823 Gross per 24 hour  Intake 500 ml  Output --  Net 500 ml   Filed Weights   03/26/20 1440  Weight: 89.8 kg    Examination:  General exam: Appears calm and comfortable , nad family at bedside Respiratory system: Clear to auscultation. Respiratory effort normal. Cardiovascular system: S1 & S2 heard, RRR. No JVD, murmurs, rubs, gallops or clicks.  Gastrointestinal system: Abdomen is nondistended, soft and nontender. Normal bowel sounds heard. Central nervous system: Alert and oriented. Grossly intact Extremities: No edema Skin: Warm dry Psychiatry: Judgement and insight appear normal. Mood & affect appropriate in current setting.     Data Reviewed: I have personally reviewed following labs and imaging studies  CBC: Recent  Labs  Lab 03/26/20 1506 03/27/20 0509  WBC 8.9 13.5*  NEUTROABS 6.7  --   HGB 12.6 10.7*  HCT 36.2 32.4*  MCV 87.7 91.5  PLT 148* 137*   Basic Metabolic Panel: Recent Labs  Lab 03/26/20 1506 03/27/20 0509  NA 142 139  K 3.7 4.4  CL 110 108  CO2 24 23  GLUCOSE 166* 139*  BUN 17 18  CREATININE 0.75 0.76  CALCIUM 8.9 8.8*  MG  --  1.9   GFR: Estimated Creatinine Clearance: 50.6 mL/min (by C-G formula based on SCr of 0.76 mg/dL). Liver Function Tests: No results for input(s): AST, ALT, ALKPHOS, BILITOT, PROT, ALBUMIN in the last 168 hours. No results for input(s): LIPASE, AMYLASE in the last 168 hours. No results for input(s): AMMONIA in the last 168 hours. Coagulation Profile: Recent Labs  Lab 03/26/20 1506  INR 1.1   Cardiac Enzymes: No results for input(s): CKTOTAL, CKMB, CKMBINDEX, TROPONINI in the last 168 hours. BNP (last 3 results) No results for input(s): PROBNP in the last 8760 hours. HbA1C: No results for input(s): HGBA1C in the last 72 hours. CBG: Recent Labs  Lab 03/27/20 0737  GLUCAP 120*   Lipid Profile: No results for input(s): CHOL, HDL, LDLCALC, TRIG, CHOLHDL, LDLDIRECT in the last 72 hours. Thyroid Function Tests: No results for input(s): TSH, T4TOTAL, FREET4, T3FREE, THYROIDAB in the last 72 hours. Anemia Panel: No results for  input(s): VITAMINB12, FOLATE, FERRITIN, TIBC, IRON, RETICCTPCT in the last 72 hours. Sepsis Labs: No results for input(s): PROCALCITON, LATICACIDVEN in the last 168 hours.  Recent Results (from the past 240 hour(s))  SARS Coronavirus 2 by RT PCR (hospital order, performed in [facility] hospital lab) Nasopharyngeal Nasopharyngeal Swab     Status: None   Collection Time: 03/26/20  5:00 PM   Specimen: Nasopharyngeal Swab  Result Value Ref Range Status   SARS Coronavirus 2 NEGATIVE NEGATIVE Final    Comment: (NOTE) SARS-CoV-2 target nucleic acids are NOT DETECTED.  The SARS-CoV-2 RNA is generally detectable in  upper and lower respiratory specimens during the acute phase of infection. The lowest concentration of SARS-CoV-2 viral copies this assay can detect is 250 copies / mL. A negative result does not preclude SARS-CoV-2 infection and should not be used as the sole basis for treatment or other patient management decisions.  A negative result may occur with improper specimen collection / handling, submission of specimen other than nasopharyngeal swab, presence of viral mutation(s) within the areas targeted by this assay, and inadequate number of viral copies (<250 copies / mL). A negative result must be combined with clinical observations, patient history, and epidemiological information.  Fact Sheet for Patients:   BoilerBrush.com.cy  Fact Sheet for Healthcare Providers: https://pope.com/  This test is not yet approved or  cleared by the [place] FDA and has been authorized for detection and/or diagnosis of SARS-CoV-2 by FDA under an Emergency Use Authorization (EUA).  This EUA will remain in effect (meaning this test can be used) for the duration of the COVID-19 declaration under Section 564(b)(1) of the Act, 21 U.S.C. section 360bbb-3(b)(1), unless the authorization is terminated or revoked sooner.  Performed at [facility], [address] [place], [place] [address]   Surgical pcr screen     Status: Abnormal   Collection Time: 03/27/20  4:42 AM  Result Value Ref Range Status   MRSA, PCR NEGATIVE NEGATIVE Final   Staphylococcus aureus POSITIVE (A) NEGATIVE Final    Comment: (NOTE) The Xpert SA Assay (FDA approved for NASAL specimens in patients 19 years of age and older), is one component of a comprehensive surveillance program. It is not intended to diagnose infection nor to guide or monitor treatment. Performed at [facility], [address], [place], [place] [address]          Radiology  Studies: DG Chest 1 View  Result Date: 03/26/2020 CLINICAL DATA:  86 year old female with right femoral fracture. EXAM: CHEST  1 VIEW COMPARISON:  Chest radiograph dated 12/20/2017. FINDINGS: No focal consolidation, pleural effusion, or pneumothorax. Mild chronic interstitial coarsening. There is stable cardiomegaly. Osteopenia. No acute osseous pathology. IMPRESSION: No acute cardiopulmonary process. Electronically Signed   By: Elgie Collard M.D.   On: 03/26/2020 16:28   DG Pelvis 1-2 Views  Result Date: 03/26/2020 CLINICAL DATA:  86 year old female with fall and right hip pain. EXAM: RIGHT FEMUR 2 VIEWS; PELVIS - 1-2 VIEW COMPARISON:  CT abdomen pelvis dated 01/19/2018. FINDINGS: There is a displaced oblique or spiral fracture of the proximal right femur extending from the femoral neck into the proximal femoral diaphysis. There is full shaft width medial displacement of the distal fracture fragment. There is no dislocation. The bones are osteopenic. Vascular calcification as well as calcified fibroid over the left pelvis. The soft tissues are grossly unremarkable. IMPRESSION: Displaced oblique or spiral fracture of the proximal right femur. Electronically Signed   By: Burtis Junes  Radparvar M.D.   On: 03/26/2020 16:28   DG Femur Min 2 Views Right  Result Date: 03/26/2020 CLINICAL DATA:  86 year old female with fall and right hip pain. EXAM: RIGHT FEMUR 2 VIEWS; PELVIS - 1-2 VIEW COMPARISON:  CT abdomen pelvis dated 01/19/2018. FINDINGS: There is a displaced oblique or spiral fracture of the proximal right femur extending from the femoral neck into the proximal femoral diaphysis. There is full shaft width medial displacement of the distal fracture fragment. There is no dislocation. The bones are osteopenic. Vascular calcification as well as calcified fibroid over the left pelvis. The soft tissues are grossly unremarkable. IMPRESSION: Displaced oblique or spiral fracture of the proximal right femur.  Electronically Signed   By: Elgie Collard M.D.   On: 03/26/2020 16:28        Scheduled Meds: . atorvastatin  80 mg Oral q1800  . metoprolol tartrate  12.5 mg Oral BID  . sertraline  100 mg Oral Daily   Continuous Infusions: .  ceFAZolin (ANCEF) IV    . methocarbamol (ROBAXIN) IV 500 mg (03/27/20 0224)    Assessment & Plan:   Active Problems:   Closed displaced fracture of proximal epiphysis of right femur (HCC)   # Displaced fracture of the proximal right femur # Accidental fall --INTRAMEDULLARY (IM) NAIL INTERTROCHANTRIC with Dr. Martha Clan today --Oxycodone 5 mg PRN for pain --IV morphine PRN for break through pain  Hx of NSTEMI s/p stents Hx of CAD --continue lipitor --hold plavix (last dose on morning of 7/6)  HTN --continue home metop  Depression --continue home zoloft   DVT prophylaxis: SCD/Compression stockings Code Status: Full code  Family Communication: family at bedside Disposition Plan: likely SNF rehab    Remains inpatient appropriate because:Ongoing diagnostic testing needed not appropriate for outpatient work up   Dispo: The patient is from: Home              Anticipated d/c is to: SNF              Anticipated d/c date is: 2 days              Patient currently is not medically stable to d/c.            LOS: 1 day   Time spent: 45 min with >50% on coc    Lynn Ito, MD [facility] Pager [phone]  If 7PM-7AM, please contact night-coverage [email or web address] Password [facility] 03/27/2020, 7:59 AM

## 2020-03-27 NOTE — Anesthesia Procedure Notes (Signed)
Procedure Name: Intubation Date/Time: 03/27/2020 3:17 PM Performed by: Lynden Oxford, CRNA Pre-anesthesia Checklist: Patient identified, Emergency Drugs available, Suction available and Patient being monitored Patient Re-evaluated:Patient Re-evaluated prior to induction Oxygen Delivery Method: Circle system utilized Preoxygenation: Pre-oxygenation with 100% oxygen Induction Type: IV induction Ventilation: Mask ventilation without difficulty Laryngoscope Size: McGraph and 3 Grade View: Grade I Tube type: Oral Tube size: 7.0 mm Number of attempts: 1 Airway Equipment and Method: Stylet,  Oral airway and Video-laryngoscopy Placement Confirmation: ETT inserted through vocal cords under direct vision,  positive ETCO2 and breath sounds checked- equal and bilateral Secured at: 21 cm Tube secured with: Tape Dental Injury: Teeth and Oropharynx as per pre-operative assessment

## 2020-03-27 NOTE — Plan of Care (Signed)

## 2020-03-27 NOTE — Procedures (Signed)
Central Venous Catheter Insertion Procedure Note  TAKAYLA BAILLIE  [id number]  [birth year]  Date:03/27/20  Time:9:15 PM   Provider Performing:Shatori Bertucci D Elvina Sidle   Procedure: Insertion of Non-tunneled Central Venous Catheter(36556) with US guidance (40352)   Indication(s) Medication administration and Difficult access  Consent Unable to obtain consent due to emergent nature of procedure.  Anesthesia Topical only with 1% lidocaine   Timeout Verified patient identification, verified procedure, site/side was marked, verified correct patient position, special equipment/implants available, medications/allergies/relevant history reviewed, required imaging and test results available.  Sterile Technique Maximal sterile technique including full sterile barrier drape, hand hygiene, sterile gown, sterile gloves, mask, hair covering, sterile ultrasound probe cover (if used).  Procedure Description Area of catheter insertion was cleaned with chlorhexidine and draped in sterile fashion.  With real-time ultrasound guidance a central venous catheter was placed into the right internal jugular vein. Nonpulsatile blood flow and easy flushing noted in all ports.  The catheter was sutured in place and sterile dressing applied.  Complications/Tolerance None; patient tolerated the procedure well. Chest X-ray is ordered to verify placement for internal jugular or subclavian cannulation.   Chest x-ray is not ordered for femoral cannulation.  EBL Minimal  Specimen(s) None  Line was secured at the 16 cm mark.  BIOPATCH was applied to the insertion site.    Harlon Ditty, AGACNP-BC [facility] Pager: [phone]

## 2020-03-28 ENCOUNTER — Inpatient Hospital Stay: Payer: PPO

## 2020-03-28 ENCOUNTER — Encounter: Payer: Self-pay | Admitting: Orthopedic Surgery

## 2020-03-28 ENCOUNTER — Inpatient Hospital Stay
Admit: 2020-03-28 | Discharge: 2020-03-28 | Disposition: A | Discharge status: Home / Self Care | Payer: PPO | Attending: Pulmonary Disease | Admitting: Pulmonary Disease

## 2020-03-28 LAB — BASIC METABOLIC PANEL
Anion gap: 3 — ABNORMAL LOW (ref 5–15)
BUN: 25 mg/dL — ABNORMAL HIGH (ref 8–23)
CO2: 23 mmol/L (ref 22–32)
Calcium: 7.3 mg/dL — ABNORMAL LOW (ref 8.9–10.3)
Chloride: 111 mmol/L (ref 98–111)
Creatinine, Ser: 0.81 mg/dL (ref 0.44–1.00)
GFR calc Af Amer: >60 mL/min (ref >60)
GFR calc non Af Amer: >60 mL/min (ref >60)
Glucose, Bld: 222 mg/dL — ABNORMAL HIGH (ref 70–99)
Potassium: 4.5 mmol/L (ref 3.5–5.1)
Sodium: 137 mmol/L (ref 135–145)

## 2020-03-28 LAB — URINALYSIS, COMPLETE (UACMP) WITH MICROSCOPIC
Bilirubin Urine: NEGATIVE
Glucose, UA: NEGATIVE mg/dL
Hgb urine dipstick: NEGATIVE
Ketones, ur: NEGATIVE mg/dL
Leukocytes,Ua: NEGATIVE
Nitrite: NEGATIVE
Protein, ur: 30 mg/dL — AB
Specific Gravity, Urine: 1.028 (ref 1.005–1.030)
pH: 5 (ref 5.0–8.0)

## 2020-03-28 LAB — CBC
HCT: 22.8 % — ABNORMAL LOW (ref 36.0–46.0)
HCT: 20.4 % — ABNORMAL LOW (ref 36.0–46.0)
Hemoglobin: 7.9 g/dL — ABNORMAL LOW (ref 12.0–15.0)
Hemoglobin: 7 g/dL — ABNORMAL LOW (ref 12.0–15.0)
MCH: 31.5 pg (ref 26.0–34.0)
MCH: 31 pg (ref 26.0–34.0)
MCHC: 34.6 g/dL (ref 30.0–36.0)
MCHC: 34.3 g/dL (ref 30.0–36.0)
MCV: 90.8 fL (ref 80.0–100.0)
MCV: 90.3 fL (ref 80.0–100.0)
Platelets: 87 10*3/uL — ABNORMAL LOW (ref 150–400)
Platelets: 85 10*3/uL — ABNORMAL LOW (ref 150–400)
RBC: 2.51 MIL/uL — ABNORMAL LOW (ref 3.87–5.11)
RBC: 2.26 MIL/uL — ABNORMAL LOW (ref 3.87–5.11)
RDW: 14.6 % (ref 11.5–15.5)
RDW: 15.3 % (ref 11.5–15.5)
WBC: 21 10*3/uL — ABNORMAL HIGH (ref 4.0–10.5)
WBC: 18.5 10*3/uL — ABNORMAL HIGH (ref 4.0–10.5)
nRBC: 0 % (ref 0.0–0.2)
nRBC: 0.4 % — ABNORMAL HIGH (ref 0.0–0.2)

## 2020-03-28 LAB — GLUCOSE, CAPILLARY
Glucose-Capillary: 161 mg/dL — ABNORMAL HIGH (ref 70–99)
Glucose-Capillary: 164 mg/dL — ABNORMAL HIGH (ref 70–99)
Glucose-Capillary: 146 mg/dL — ABNORMAL HIGH (ref 70–99)
Glucose-Capillary: 137 mg/dL — ABNORMAL HIGH (ref 70–99)
Glucose-Capillary: 115 mg/dL — ABNORMAL HIGH (ref 70–99)
Glucose-Capillary: 145 mg/dL — ABNORMAL HIGH (ref 70–99)

## 2020-03-28 LAB — POCT I-STAT, CHEM 8
BUN: 27 mg/dL — ABNORMAL HIGH (ref 8–23)
Calcium, Ion: 1.24 mmol/L (ref 1.15–1.40)
Chloride: 106 mmol/L (ref 98–111)
Creatinine, Ser: 0.8 mg/dL (ref 0.44–1.00)
Glucose, Bld: 160 mg/dL — ABNORMAL HIGH (ref 70–99)
HCT: 24 % — ABNORMAL LOW (ref 36.0–46.0)
Hemoglobin: 8.2 g/dL — ABNORMAL LOW (ref 12.0–15.0)
Potassium: 4.3 mmol/L (ref 3.5–5.1)
Sodium: 138 mmol/L (ref 135–145)
TCO2: 22 mmol/L (ref 22–32)

## 2020-03-28 LAB — ECHOCARDIOGRAM COMPLETE
Height: 66 in
Weight: 3167.57 oz

## 2020-03-28 LAB — BLOOD GAS, ARTERIAL
Acid-base deficit: 5.9 mmol/L — ABNORMAL HIGH (ref 0.0–2.0)
Bicarbonate: 20.2 mmol/L (ref 20.0–28.0)
FIO2: 0.4
MECHVT: 450 mL
Mechanical Rate: 15
O2 Saturation: 97.4 %
PEEP: 5 cmH2O
Patient temperature: 37
pCO2 arterial: 41 mmHg (ref 32.0–48.0)
pH, Arterial: 7.3 — ABNORMAL LOW (ref 7.350–7.450)
pO2, Arterial: 105 mmHg (ref 83.0–108.0)

## 2020-03-28 LAB — HEMOGLOBIN AND HEMATOCRIT, BLOOD
HCT: 27.8 % — ABNORMAL LOW (ref 36.0–46.0)
HCT: 22.5 % — ABNORMAL LOW (ref 36.0–46.0)
Hemoglobin: 10 g/dL — ABNORMAL LOW (ref 12.0–15.0)
Hemoglobin: 7.9 g/dL — ABNORMAL LOW (ref 12.0–15.0)

## 2020-03-28 LAB — MAGNESIUM: Magnesium: 1.5 mg/dL — ABNORMAL LOW (ref 1.7–2.4)

## 2020-03-28 LAB — LACTIC ACID, PLASMA: Lactic Acid, Venous: 2.9 mmol/L (ref 0.5–1.9)

## 2020-03-28 LAB — TROPONIN I (HIGH SENSITIVITY): Troponin I (High Sensitivity): 20 ng/L — ABNORMAL HIGH (ref <18)

## 2020-03-28 MED ORDER — SODIUM CHLORIDE 0.9% FLUSH: 10.0000 mL | INTRAVENOUS | Status: DC | PRN

## 2020-03-28 MED ORDER — SODIUM CHLORIDE 0.9% IV SOLUTION: Freq: Once | INTRAVENOUS | Status: AC

## 2020-03-28 MED ORDER — ORAL CARE MOUTH RINSE
15.0000 mL | OROMUCOSAL | Status: DC
  Administered 2020-03-28 (×6): 15 mL via OROMUCOSAL

## 2020-03-28 MED ORDER — CHLORHEXIDINE GLUCONATE 0.12% ORAL RINSE (MEDLINE KIT)
15.0000 mL | Freq: Two times a day (BID) | OROMUCOSAL | Status: DC
  Administered 2020-03-28: 15 mL via OROMUCOSAL

## 2020-03-28 MED ORDER — MUPIROCIN 2 % EX OINT
1.0000 "application " | TOPICAL_OINTMENT | Freq: Two times a day (BID) | CUTANEOUS | Status: AC
  Administered 2020-03-28 – 2020-04-01 (×10): 1 via NASAL
  Filled 2020-03-28: qty 22

## 2020-03-28 MED ORDER — PHENYLEPHRINE CONCENTRATED 100MG/250ML (0.4 MG/ML) INFUSION SIMPLE
0.0000 ug/min | INTRAVENOUS | Status: DC
  Filled 2020-03-28: qty 250

## 2020-03-28 MED ORDER — SODIUM CHLORIDE 0.9% FLUSH
10.0000 mL | Freq: Two times a day (BID) | INTRAVENOUS | Status: DC
  Administered 2020-03-28 – 2020-04-01 (×8): 10 mL

## 2020-03-28 MED ORDER — ALBUMIN HUMAN 25 % IV SOLN
12.5000 g | Freq: Every day | INTRAVENOUS | Status: DC
  Administered 2020-03-28 – 2020-04-01 (×5): 12.5 g via INTRAVENOUS
  Filled 2020-03-28 (×5): qty 50

## 2020-03-28 MED ORDER — SODIUM CHLORIDE 0.9 % IV SOLN
0.0000 ug/min | INTRAVENOUS | Status: DC
  Filled 2020-03-28 (×3): qty 10

## 2020-03-28 MED ORDER — LACTATED RINGERS IV BOLUS
1000.0000 mL | Freq: Once | INTRAVENOUS | Status: AC
  Administered 2020-03-28: 1000 mL via INTRAVENOUS

## 2020-03-28 MED ORDER — DEXMEDETOMIDINE HCL IN NACL 400 MCG/100ML IV SOLN
0.4000 ug/kg/h | INTRAVENOUS | Status: DC
  Administered 2020-03-28 (×2): 0.4 ug/kg/h via INTRAVENOUS
  Filled 2020-03-28 (×2): qty 100

## 2020-03-28 MED ORDER — MAGNESIUM SULFATE 2 GM/50ML IV SOLN
2.0000 g | Freq: Once | INTRAVENOUS | Status: AC
  Administered 2020-03-28: 2 g via INTRAVENOUS
  Filled 2020-03-28: qty 50

## 2020-03-28 MED ORDER — SODIUM CHLORIDE 0.9 % IV SOLN
INTRAVENOUS | Status: DC | PRN
  Administered 2020-03-28: 250 mL via INTRAVENOUS

## 2020-03-28 MED ORDER — DIGOXIN 0.25 MG/ML IJ SOLN
0.5000 mg | Freq: Once | INTRAMUSCULAR | Status: AC
  Administered 2020-03-28: 0.5 mg via INTRAVENOUS
  Filled 2020-03-28: qty 2

## 2020-03-28 MED ORDER — DIGOXIN 0.25 MG/ML IJ SOLN
0.1250 mg | Freq: Every day | INTRAMUSCULAR | Status: DC
  Administered 2020-03-29 – 2020-03-31 (×3): 0.125 mg via INTRAVENOUS
  Filled 2020-03-28 (×3): qty 2

## 2020-03-28 MED ORDER — SODIUM CHLORIDE 0.9 % IV SOLN
0.0000 ug/min | INTRAVENOUS | Status: DC
  Filled 2020-03-28: qty 10

## 2020-03-28 NOTE — Consult Note (Signed)
[facility] Cardiology Consultation Note  Patient ID: Alison Russo, MRN: [id number], DOB/AGE: 84-May-1927 84 y.o. Admit date: 03/26/2020   Date of Consult: 03/28/2020 Primary Physician: Baxter Hire, MD Primary Cardiologist: [facility]  Chief Complaint:  Chief Complaint  Patient presents with  . Fall   Reason for Consult: Atrial fibrillation  HPI: 86 y.o. female with known coronary atherosclerosis status post previous non-ST elevation myocardial infarction and coronary atherosclerosis with previous stent placements hypertension hyperlipidemia sleep apnea on all appropriate medication management prior to her current hospitalization.  Physically she has been doing fairly well with no evidence of angina and or congestive heart failure symptoms.  She has had paroxysmal nonvalvular atrial fibrillation as well for which she has been placed on previous medication management.  She had a mechanical fall and fractured her right leg for which she needed surgical intervention.  After her surgical intervention the patient became hypotensive with some bleeding for which the patient has apparently received packed red blood cells with stabilization at this time.  Her hypotension is continuing to be slight needing pressors at this time.  She was intubated and continues to be on a respirator at this time with sedation.  She has had an EKG which has shown atrial fibrillation with controlled ventricular rate and no other significant changes.  Currently it appears that the patient is stabilized from the surgical standpoint  Past Medical History:  Diagnosis Date  . Anginal pain ([place])   . CAD (coronary artery disease)   . Heart murmur   . Hypertension   . Mood disorder ([place])    mostly anxiety  . NSTEMI (non-ST elevated myocardial infarction) ([place]) 12/22/2017  . OSA on CPAP   . PE (pulmonary embolism)    recurrent      Surgical History:  Past Surgical History:  Procedure Laterality Date  . BREAST CYST  ASPIRATION Right    neg  . CARDIAC CATHETERIZATION    . CATARACT EXTRACTION, BILATERAL    . COLONOSCOPY    . CORONARY STENT INTERVENTION N/A 12/23/2017   Procedure: CORONARY STENT INTERVENTION;  Surgeon: Martinique, Peter M, MD;  Location: [facility];  Service: Cardiovascular;  Laterality: N/A;  . EYE SURGERY Left    "blood vessel busted behind my eye"  . EYE SURGERY Right    ?vitrectomy  . HYSTEROSCOPY WITH D & C N/A 10/25/2016   Procedure: DILATATION AND CURETTAGE /HYSTEROSCOPY, POLYPECTOMY;  Surgeon: Benjaman Kindler, MD;  Location: [facility];  Service: Gynecology;  Laterality: N/A;  . INGUINAL HERNIA REPAIR Right 1950s  . LEFT HEART CATH AND CORONARY ANGIOGRAPHY N/A 12/21/2017   Procedure: LEFT HEART CATH AND CORONARY ANGIOGRAPHY possible PCI and stent;  Surgeon: Yolonda Kida, MD;  Location: [place] [facility];  Service: Cardiovascular;  Laterality: N/A;  . LOWER EXTREMITY ANGIOGRAPHY Right 01/11/2018   Procedure: LOWER EXTREMITY ANGIOGRAPHY;  Surgeon: Algernon Huxley, MD;  Location: [place] [facility];  Service: Cardiovascular;  Laterality: Right;  . PSEUDOANERYSM COMPRESSION Left 01/20/2018   Procedure: PSEUDOANERYSM COMPRESSION;  Surgeon: Katha Cabal, MD;  Location: [place] [facility];  Service: Cardiovascular;  Laterality: Left;  . TONSILLECTOMY       Home Meds: Prior to Admission medications   Medication Sig Start Date End Date Taking? Authorizing Provider  atorvastatin (LIPITOR) 80 MG tablet TAKE 1 TABLET (80 MG TOTAL) BY MOUTH DAILY AT 6 PM. 03/03/18  Yes Lavera Guise, MD  clopidogrel (PLAVIX) 75 MG tablet Take 1 tablet (  75 mg total) by mouth daily with breakfast. 12/26/17  Yes Theora Gianotti, NP  metoprolol tartrate (LOPRESSOR) 25 MG tablet Take 0.5 tablets (12.5 mg total) by mouth 2 (two) times daily. 03/20/18  Yes Boscia, Greer Ee, NP  Multiple Vitamins-Minerals (PRESERVISION AREDS 2+MULTI VIT PO) Take 1 tablet by mouth 2 (two) times daily.   Yes [provider]  sertraline (ZOLOFT) 100 MG tablet Take 1 tablet (100 mg total) by mouth daily. 03/20/18  Yes Boscia, Greer Ee, NP  acetaminophen (TYLENOL) 325 MG tablet Take 2 tablets (650 mg total) by mouth every 6 (six) hours as needed for mild pain (or Fever >/= 101). 01/23/18   Gladstone Lighter, MD  feeding supplement, ENSURE ENLIVE, (ENSURE ENLIVE) LIQD Take 237 mLs by mouth 2 (two) times daily between meals. 01/24/18   Gladstone Lighter, MD  Naphazoline-Pheniramine (OPCON-A OP) Place 1-2 drops into both eyes 3 (three) times daily as needed (for itchy/irritated eyes.).    [provider]  pantoprazole (PROTONIX) 40 MG tablet TAKE 1 TABLET BY MOUTH EVERY DAY Patient not taking: Reported on 03/26/2020 02/06/18   Ronnell Freshwater, NP  polyethylene glycol (MIRALAX / GLYCOLAX) packet Take 17 g by mouth daily. Hold if >2 bowel movements /day 01/23/18   Gladstone Lighter, MD  traMADol (ULTRAM) 50 MG tablet Take 1 tablet (50 mg total) by mouth every 6 (six) hours as needed for moderate pain. Patient not taking: Reported on 09/06/2019 01/23/18   Gladstone Lighter, MD  traZODone (DESYREL) 50 MG tablet Take 0.5 tablets (25 mg total) by mouth at bedtime as needed for sleep. Patient not taking: Reported on 03/26/2020 12/22/17   Vaughan Basta, MD    Inpatient Medications:  . atorvastatin  80 mg Oral q1800  . chlorhexidine gluconate (MEDLINE KIT)  15 mL Mouth Rinse BID  . Chlorhexidine Gluconate Cloth  6 each Topical Daily  . docusate  100 mg Oral BID  . fentaNYL (SUBLIMAZE) injection  25 mcg Intravenous Once  . hydrocortisone sod succinate (SOLU-CORTEF) inj  50 mg Intravenous Q6H  . insulin aspart  0-15 Units Subcutaneous Q4H  . mouth rinse  15 mL Mouth Rinse 10 times per day  . polyethylene glycol  17 g Oral Daily  . sertraline  100 mg Oral Daily  . sodium chloride flush  10-40 mL Intracatheter Q12H   .  ceFAZolin (ANCEF) IV Stopped (03/28/20 0657)  . dexmedetomidine (PRECEDEX) IV  infusion 0.8 mcg/kg/hr (03/28/20 0700)  . famotidine (PEPCID) IV Stopped (03/27/20 2315)  . fentaNYL infusion INTRAVENOUS 100 mcg/hr (03/28/20 0700)  . lactated ringers 75 mL/hr at 03/28/20 0700  . magnesium sulfate bolus IVPB    . methocarbamol (ROBAXIN) IV Stopped (03/27/20 1322)  . phenylephrine (NEO-SYNEPHRINE) Adult infusion 50 mcg/min (03/28/20 0700)  . vasopressin 0.02 Units/min (03/28/20 0700)    Allergies: No Known Allergies  Social History   Socioeconomic History  . Marital status: Widowed    Spouse name: Not on file  . Number of children: 2  . Years of education: Not on file  . Highest education level: Not on file  Occupational History  . Occupation: Office work  . Occupation: Custodian    Comment: [employer]  Tobacco Use  . Smoking status: Never Smoker  . Smokeless tobacco: Never Used  Vaping Use  . Vaping Use: Never used  Substance and Sexual Activity  . Alcohol use: Never  . Drug use: Never  . Sexual activity: Not Currently  Other Topics Concern  .  Not on file  Social History Narrative   Has living will   Daughter is health care POA   Would accept resuscitation attempts   No tube feeds if cognitively unaware   Social Determinants of Health   Financial Resource Strain:   . Difficulty of Paying Living Expenses:   Food Insecurity:   . Worried About Charity fundraiser in the Last Year:   . Arboriculturist in the Last Year:   Transportation Needs:   . Film/video editor (Medical):   Marland Kitchen Lack of Transportation (Non-Medical):   Physical Activity:   . Days of Exercise per Week:   . Minutes of Exercise per Session:   Stress:   . Feeling of Stress :   Social Connections:   . Frequency of Communication with Friends and Family:   . Frequency of Social Gatherings with Friends and Family:   . Attends Religious Services:   . Active Member of Clubs or Organizations:   . Attends Archivist Meetings:   Marland Kitchen Marital Status:   Intimate Partner  Violence:   . Fear of Current or Ex-Partner:   . Emotionally Abused:   Marland Kitchen Physically Abused:   . Sexually Abused:      Family History  Problem Relation Age of Onset  . Breast cancer Mother 12  . AAA (abdominal aortic aneurysm) Father        ruptured  . Coronary artery disease Brother      Review of Systems Cannot assess  Labs: No results for input(s): CKTOTAL, CKMB, TROPONINI in the last 72 hours. Lab Results  Component Value Date   WBC 21.0 (H) 03/28/2020   HGB 10.0 (L) 03/28/2020   HCT 27.8 (L) 03/28/2020   MCV 90.8 03/28/2020   PLT 87 (L) 03/28/2020    Recent Labs  Lab 03/27/20 2122 03/27/20 2122 03/28/20 0040  NA 138   < > 137  K 4.5   < > 4.5  CL 108   < > 111  CO2 26   < > 23  BUN 24*   < > 25*  CREATININE 0.82   < > 0.81  CALCIUM 7.5*   < > 7.3*  PROT 4.0*  --   --   BILITOT 1.4*  --   --   ALKPHOS 34*  --   --   ALT 14  --   --   AST 30  --   --   GLUCOSE 189*   < > 222*   < > = values in this interval not displayed.   Lab Results  Component Value Date   CHOL 187 12/23/2017   HDL 43 12/23/2017   LDLCALC 117 (H) 12/23/2017   TRIG 135 12/23/2017   No results found for: DDIMER  Radiology/Studies:  DG Chest 1 View  Result Date: 03/26/2020 CLINICAL DATA:  86 year old female with right femoral fracture. EXAM: CHEST  1 VIEW COMPARISON:  Chest radiograph dated 12/20/2017. FINDINGS: No focal consolidation, pleural effusion, or pneumothorax. Mild chronic interstitial coarsening. There is stable cardiomegaly. Osteopenia. No acute osseous pathology. IMPRESSION: No acute cardiopulmonary process. Electronically Signed   By: Anner Crete M.D.   On: 03/26/2020 16:28   DG Pelvis 1-2 Views  Result Date: 03/26/2020 CLINICAL DATA:  86 year old female with fall and right hip pain. EXAM: RIGHT FEMUR 2 VIEWS; PELVIS - 1-2 VIEW COMPARISON:  CT abdomen pelvis dated 01/19/2018. FINDINGS: There is a displaced oblique or spiral fracture of the proximal right femur  extending from the femoral neck into the proximal femoral diaphysis. There is full shaft width medial displacement of the distal fracture fragment. There is no dislocation. The bones are osteopenic. Vascular calcification as well as calcified fibroid over the left pelvis. The soft tissues are grossly unremarkable. IMPRESSION: Displaced oblique or spiral fracture of the proximal right femur. Electronically Signed   By: Anner Crete M.D.   On: 03/26/2020 16:28   DG Chest Port 1 View  Result Date: 03/28/2020 CLINICAL DATA:  Acute respiratory failure. EXAM: PORTABLE CHEST 1 VIEW COMPARISON:  03/27/2020. FINDINGS: Endotracheal tube, right IJ line in stable position. Cardiomegaly. No pulmonary venous congestion. Low lung volumes with mild bibasilar atelectasis/infiltrates. Tiny left pleural effusion cannot be excluded. No pneumothorax. IMPRESSION: 1.  Lines and tubes in stable position. 2.  Stable cardiomegaly.  No pulmonary venous congestion. 3. Low lung volumes with mild bibasilar atelectasis/infiltrates. Small left pleural effusion cannot be excluded. Similar findings noted on prior exam. Electronically Signed   By: Marcello Moores  Register   On: 03/28/2020 05:23   Portable Chest x-ray  Result Date: 03/27/2020 CLINICAL DATA:  Post intubation. EXAM: PORTABLE CHEST 1 VIEW COMPARISON:  Radiograph yesterday. FINDINGS: Endotracheal tube tip 5.6 cm in the carina. Right internal jugular central venous catheter tip projects over the upper SVC. Similar cardiomegaly. No pneumothorax. Low lung volumes. Minimal blunting of the costophrenic angles may be due to small effusions. Chronic interstitial coarsening. No confluent airspace disease. IMPRESSION: 1. Endotracheal tube tip 5.6 cm in the carina. 2. Right internal jugular central venous catheter tip projects over the upper SVC. No pneumothorax. 3. Low lung volumes. Similar cardiomegaly. Possible small pleural effusions. Electronically Signed   By: Keith Rake M.D.   On:  03/27/2020 21:16   DG HIP OPERATIVE UNILAT W OR W/O PELVIS RIGHT  Result Date: 03/27/2020 CLINICAL DATA:  86 year old female with right femoral ORIF. EXAM: OPERATIVE right HIP (WITH PELVIS IF PERFORMED) 2 VIEWS TECHNIQUE: Fluoroscopic spot image(s) were submitted for interpretation post-operatively. COMPARISON:  None. FINDINGS: Eight intraoperative fluoroscopic spot images provided. The total fluoro time is 2 minutes 7 seconds and total air kerma of 20.6 mGy. Right femoral nail noted which appears intact. IMPRESSION: Status post right femoral nail. Electronically Signed   By: Anner Crete M.D.   On: 03/27/2020 18:28   DG FEMUR, MIN 2 VIEWS RIGHT  Result Date: 03/27/2020 CLINICAL DATA:  Postop femur fracture fixation. EXAM: RIGHT FEMUR 2 VIEWS COMPARISON:  Radiograph yesterday. FINDINGS: Intramedullary nail with trans trochanteric and distal locking screw with proximal cerclage wire fixation of proximal femur fracture. Fracture is in improved alignment compared to preoperative imaging. Recent postsurgical change includes air and edema in the soft tissues as well as skin staples. There are vascular calcifications. IMPRESSION: Post ORIF mid-proximal femur fracture without immediate postoperative complication. Electronically Signed   By: Keith Rake M.D.   On: 03/27/2020 21:17   DG Femur Min 2 Views Right  Result Date: 03/26/2020 CLINICAL DATA:  86 year old female with fall and right hip pain. EXAM: RIGHT FEMUR 2 VIEWS; PELVIS - 1-2 VIEW COMPARISON:  CT abdomen pelvis dated 01/19/2018. FINDINGS: There is a displaced oblique or spiral fracture of the proximal right femur extending from the femoral neck into the proximal femoral diaphysis. There is full shaft width medial displacement of the distal fracture fragment. There is no dislocation. The bones are osteopenic. Vascular calcification as well as calcified fibroid over the left pelvis. The soft tissues are grossly unremarkable. IMPRESSION: Displaced  oblique or  spiral fracture of the proximal right femur. Electronically Signed   By: Anner Crete M.D.   On: 03/26/2020 16:28    EKG: Atrial fibrillation with controlled ventricular rate and nonspecific ST changes  Weights: Filed Weights   03/26/20 1440 03/27/20 1428  Weight: 89.8 kg 89.8 kg     Physical Exam: Blood pressure 115/84, pulse (!) 102, temperature 98.1 F (36.7 C), temperature source Axillary, resp. rate 17, height _0  (1.676 m), weight 89.8 kg, SpO2 96 %. Body mass index is 31.95 kg/m. General: Well developed, well nourished, in no acute distress. Head eyes ears nose throat: Normocephalic, atraumatic, sclera non-icteric, no xanthomas, nares are without discharge. No apparent thyromegaly and/or mass  Lungs: Normal respiratory effort.  no wheezes, no rales, few rhonchi.  Heart: Irregular with normal S1 S2.  2+ right upper sternal border murmur gallop, no rub, PMI is normal size and placement, carotid upstroke normal without bruit, jugular venous pressure is normal Abdomen: Soft, non-tender, non-distended with normoactive bowel sounds. No hepatomegaly. No rebound/guarding. No obvious abdominal masses. Abdominal aorta is normal size without bruit Extremities: No edema. no cyanosis, no clubbing, no ulcers  Peripheral : 2+ bilateral upper extremity pulses, 2+ bilateral femoral pulses, 2+ bilateral dorsal pedal pulse Neuro: Not alert and oriented. No facial asymmetry. No focal deficit. Moves all extremities spontaneously. Musculoskeletal: Normal muscle tone without kyphosis Psych: Does not responds to questions appropriately with a normal affect.    Assessment: 86 year old female with known coronary artery disease status post PCI and stent placement previous non-ST elevation myocardial infarction hypertension hyperlipidemia diabetes status post fracture now with hypotension post surgery and atrial fibrillation with controlled ventricular rate and no evidence of myocardial  infarction or congestive heart failure  Plan: 1.  Continue supportive care of hypotension with pressors 2.  Continue other supportive care status post surgery including treatment of bleeding anemia and other complicating factors 3.  No other cardiac diagnostics necessary at this time due to no evidence of congestive heart failure and or myocardial infarction 4.  No additional medication management for heart rate control of atrial fibrillation with is reasonably controlled at this time and concerns of hypotension 5.  Further medication management changes after above  Signed, Corey Skains M.D. [facility] 03/28/2020, 8:19 AM

## 2020-03-28 NOTE — Progress Notes (Signed)
Subjective:  POD #1 s/p intramedullary fixation for right femoral shaft fracture.   Patient is now extubated.  Patient reports right lower extremity pain as mild to moderate.  Patient's daughter is at the bedside.  Patient is talkative and has many questions regarding surgery.  She states that her pain compared to preoperatively is much improved.   Objective:   VITALS:   Vitals:   03/28/20 1300 03/28/20 1400 03/28/20 1500 03/28/20 1600  BP: 111/72 96/69 93/70  (!) 92/59  Pulse: 97 85 93 99  Resp: 18 18 18 15   Temp:    98.5 F (36.9 C)  TempSrc:    Axillary  SpO2: 93% 93% 100% 90%  Weight:      Height:        PHYSICAL EXAM: Right lower extremity Neurovascular intact Sensation intact distally Intact pulses distally Dorsiflexion/Plantar flexion intact Incision: dressing C/D/I No cellulitis present Compartment soft  LABS  Results for orders placed or performed during the hospital encounter of 03/26/20 (from the past 24 hour(s))  Glucose, capillary     Status: Abnormal   Collection Time: 03/27/20  6:58 PM  Result Value Ref Range   Glucose-Capillary 202 (H) 70 - 99 mg/dL  CULTURE, BLOOD (ROUTINE X 2) w Reflex to ID Panel     Status: None (Preliminary result)   Collection Time: 03/27/20  8:57 PM   Specimen: BLOOD LEFT HAND  Result Value Ref Range   Specimen Description BLOOD LEFT HAND    Special Requests      BOTTLES DRAWN AEROBIC ONLY Blood Culture results may not be optimal due to an inadequate volume of blood received in culture bottles   Culture      NO GROWTH < 12 HOURS Performed at [facility], [address] [place], [place]    Report Status PENDING   CULTURE, BLOOD (ROUTINE X 2) w Reflex to ID Panel     Status: None (Preliminary result)   Collection Time: 03/27/20  9:02 PM   Specimen: BLOOD RIGHT HAND  Result Value Ref Range   Specimen Description BLOOD RIGHT HAND    Special Requests      BOTTLES DRAWN AEROBIC ONLY Blood Culture adequate  volume   Culture      NO GROWTH < 12 HOURS Performed at [facility], [address] [place], [place]    Report Status PENDING   Draw ABG 1 hour after initiation of ventilator     Status: Abnormal   Collection Time: 03/27/20  9:19 PM  Result Value Ref Range   FIO2 0.40    Delivery systems VENTILATOR    Mode PRESSURE REGULATED VOLUME CONTROL    VT 450 mL   Peep/cpap 5.0 cm H20   pH, Arterial 7.26 (L) 7.35 - 7.45   pCO2 arterial 45 32 - 48 mmHg   pO2, Arterial 102 83 - 108 mmHg   Bicarbonate 20.2 20.0 - 28.0 mmol/L   Acid-base deficit 6.5 (H) 0.0 - 2.0 mmol/L   O2 Saturation 96.9 %   Patient temperature 37.0    Collection site RIGHT RADIAL    Sample type ARTERIAL DRAW    Allens test (pass/fail) PASS PASS   Mechanical Rate 15   CBC with Differential/Platelet     Status: Abnormal   Collection Time: 03/27/20  9:22 PM  Result Value Ref Range   WBC 17.6 (H) 4.0 - 10.5 K/uL   RBC 2.69 (L) 3.87 - 5.11 MIL/uL   Hemoglobin 8.5 (L)  12.0 - 15.0 g/dL   HCT 40.9 (L) 36 - 46 %   MCV 91.4 80.0 - 100.0 fL   MCH 31.6 26.0 - 34.0 pg   MCHC 34.6 30.0 - 36.0 g/dL   RDW 81.1 91.4 - 78.2 %   Platelets 85 (L) 150 - 400 K/uL   nRBC 0.0 0.0 - 0.2 %   Neutrophils Relative % 88 %   Neutro Abs 15.6 (H) 1.7 - 7.7 K/uL   Lymphocytes Relative 6 %   Lymphs Abs 1.1 0.7 - 4.0 K/uL   Monocytes Relative 5 %   Monocytes Absolute 0.9 0 - 1 K/uL   Eosinophils Relative 0 %   Eosinophils Absolute 0.0 0 - 0 K/uL   Basophils Relative 0 %   Basophils Absolute 0.0 0 - 0 K/uL   Immature Granulocytes 1 %   Abs Immature Granulocytes 0.10 (H) 0.00 - 0.07 K/uL  Comprehensive metabolic panel     Status: Abnormal   Collection Time: 03/27/20  9:22 PM  Result Value Ref Range   Sodium 138 135 - 145 mmol/L   Potassium 4.5 3.5 - 5.1 mmol/L   Chloride 108 98 - 111 mmol/L   CO2 26 22 - 32 mmol/L   Glucose, Bld 189 (H) 70 - 99 mg/dL   BUN 24 (H) 8 - 23 mg/dL   Creatinine, Ser 9.56 0.44 - 1.00 mg/dL    Calcium 7.5 (L) 8.9 - 10.3 mg/dL   Total Protein 4.0 (L) 6.5 - 8.1 g/dL   Albumin 2.3 (L) 3.5 - 5.0 g/dL   AST 30 15 - 41 U/L   ALT 14 0 - 44 U/L   Alkaline Phosphatase 34 (L) 38 - 126 U/L   Total Bilirubin 1.4 (H) 0.3 - 1.2 mg/dL   GFR calc non Af Amer >60 >60 mL/min   GFR calc Af Amer >60 >60 mL/min   Anion gap 4 (L) 5 - 15  Troponin I (High Sensitivity)     Status: None   Collection Time: 03/27/20  9:22 PM  Result Value Ref Range   Troponin I (High Sensitivity) 12 <18 ng/L  Lactic acid, plasma     Status: Abnormal   Collection Time: 03/27/20  9:22 PM  Result Value Ref Range   Lactic Acid, Venous 2.9 (HH) 0.5 - 1.9 mmol/L  Procalcitonin - Baseline     Status: None   Collection Time: 03/27/20  9:22 PM  Result Value Ref Range   Procalcitonin <0.10 ng/mL  Protime-INR     Status: Abnormal   Collection Time: 03/27/20  9:22 PM  Result Value Ref Range   Prothrombin Time 18.1 (H) 11.4 - 15.2 seconds   INR 1.6 (H) 0.8 - 1.2  APTT     Status: None   Collection Time: 03/27/20  9:22 PM  Result Value Ref Range   aPTT 29 24 - 36 seconds  Urinalysis, Complete w Microscopic     Status: Abnormal   Collection Time: 03/27/20 10:40 PM  Result Value Ref Range   Color, Urine YELLOW (A) YELLOW   APPearance HAZY (A) CLEAR   Specific Gravity, Urine 1.028 1.005 - 1.030   pH 5.0 5.0 - 8.0   Glucose, UA NEGATIVE NEGATIVE mg/dL   Hgb urine dipstick NEGATIVE NEGATIVE   Bilirubin Urine NEGATIVE NEGATIVE   Ketones, ur NEGATIVE NEGATIVE mg/dL   Protein, ur 30 (A) NEGATIVE mg/dL   Nitrite NEGATIVE NEGATIVE   Leukocytes,Ua NEGATIVE NEGATIVE   RBC / HPF 0-5 0 -  5 RBC/hpf   WBC, UA 11-20 0 - 5 WBC/hpf   Bacteria, UA RARE (A) NONE SEEN   Squamous Epithelial / LPF 0-5 0 - 5   Mucus PRESENT    Hyaline Casts, UA PRESENT   Glucose, capillary     Status: Abnormal   Collection Time: 03/27/20 10:57 PM  Result Value Ref Range   Glucose-Capillary 149 (H) 70 - 99 mg/dL  Basic metabolic panel     Status:  Abnormal   Collection Time: 03/28/20 12:40 AM  Result Value Ref Range   Sodium 137 135 - 145 mmol/L   Potassium 4.5 3.5 - 5.1 mmol/L   Chloride 111 98 - 111 mmol/L   CO2 23 22 - 32 mmol/L   Glucose, Bld 222 (H) 70 - 99 mg/dL   BUN 25 (H) 8 - 23 mg/dL   Creatinine, Ser 6.38 0.44 - 1.00 mg/dL   Calcium 7.3 (L) 8.9 - 10.3 mg/dL   GFR calc non Af Amer >60 >60 mL/min   GFR calc Af Amer >60 >60 mL/min   Anion gap 3 (L) 5 - 15  CBC     Status: Abnormal   Collection Time: 03/28/20 12:40 AM  Result Value Ref Range   WBC 21.0 (H) 4.0 - 10.5 K/uL   RBC 2.51 (L) 3.87 - 5.11 MIL/uL   Hemoglobin 7.9 (L) 12.0 - 15.0 g/dL   HCT 46.6 (L) 36 - 46 %   MCV 90.8 80.0 - 100.0 fL   MCH 31.5 26.0 - 34.0 pg   MCHC 34.6 30.0 - 36.0 g/dL   RDW 59.9 35.7 - 01.7 %   Platelets 87 (L) 150 - 400 K/uL   nRBC 0.0 0.0 - 0.2 %  Magnesium     Status: Abnormal   Collection Time: 03/28/20 12:40 AM  Result Value Ref Range   Magnesium 1.5 (L) 1.7 - 2.4 mg/dL  Lactic acid, plasma     Status: Abnormal   Collection Time: 03/28/20 12:40 AM  Result Value Ref Range   Lactic Acid, Venous 2.9 (HH) 0.5 - 1.9 mmol/L  Troponin I (High Sensitivity)     Status: Abnormal   Collection Time: 03/28/20 12:40 AM  Result Value Ref Range   Troponin I (High Sensitivity) 20 (H) <18 ng/L  Prepare RBC (crossmatch)     Status: None   Collection Time: 03/28/20  1:08 AM  Result Value Ref Range   Order Confirmation      ORDER PROCESSED BY BLOOD BANK Performed at [facility], [address], [place], [place] [address]   Prepare fresh frozen plasma     Status: None (Preliminary result)   Collection Time: 03/28/20  1:09 AM  Result Value Ref Range   Unit Number [id number]    Blood Component Type THW PLS APHR    Unit division B0    Status of Unit ISSUED    Transfusion Status      OK TO TRANSFUSE Performed at [facility], [address] [place], [place] [address]   Glucose, capillary     Status: Abnormal    Collection Time: 03/28/20  3:41 AM  Result Value Ref Range   Glucose-Capillary 161 (H) 70 - 99 mg/dL  Blood gas, arterial     Status: Abnormal   Collection Time: 03/28/20  5:00 AM  Result Value Ref Range   FIO2 0.40    Delivery systems VENTILATOR    Mode PRESSURE REGULATED VOLUME CONTROL    VT 450 mL  Peep/cpap 5.0 cm H20   pH, Arterial 7.30 (L) 7.35 - 7.45   pCO2 arterial 41 32 - 48 mmHg   pO2, Arterial 105 83 - 108 mmHg   Bicarbonate 20.2 20.0 - 28.0 mmol/L   Acid-base deficit 5.9 (H) 0.0 - 2.0 mmol/L   O2 Saturation 97.4 %   Patient temperature 37.0    Collection site A-LINE    Sample type ARTERIAL DRAW    Mechanical Rate 15   Hemoglobin and hematocrit, blood     Status: Abnormal   Collection Time: 03/28/20  6:30 AM  Result Value Ref Range   Hemoglobin 10.0 (L) 12.0 - 15.0 g/dL   HCT 57.3 (L) 36 - 46 %  Glucose, capillary     Status: Abnormal   Collection Time: 03/28/20  7:46 AM  Result Value Ref Range   Glucose-Capillary 164 (H) 70 - 99 mg/dL  Glucose, capillary     Status: Abnormal   Collection Time: 03/28/20 11:35 AM  Result Value Ref Range   Glucose-Capillary 146 (H) 70 - 99 mg/dL  I-STAT, chem 8     Status: Abnormal   Collection Time: 03/28/20 12:49 PM  Result Value Ref Range   Sodium 138 135 - 145 mmol/L   Potassium 4.3 3.5 - 5.1 mmol/L   Chloride 106 98 - 111 mmol/L   BUN 27 (H) 8 - 23 mg/dL   Creatinine, Ser 2.20 0.44 - 1.00 mg/dL   Glucose, Bld 254 (H) 70 - 99 mg/dL   Calcium, Ion 2.70 6.23 - 1.40 mmol/L   TCO2 22 22 - 32 mmol/L   Hemoglobin 8.2 (L) 12.0 - 15.0 g/dL   HCT 76.2 (L) 36 - 46 %  Hemoglobin and hematocrit, blood     Status: Abnormal   Collection Time: 03/28/20  2:50 PM  Result Value Ref Range   Hemoglobin 7.9 (L) 12.0 - 15.0 g/dL   HCT 83.1 (L) 36 - 46 %  Glucose, capillary     Status: Abnormal   Collection Time: 03/28/20  4:16 PM  Result Value Ref Range   Glucose-Capillary 137 (H) 70 - 99 mg/dL    DG Chest Port 1 View  Result Date:  03/28/2020 CLINICAL DATA:  Acute respiratory failure. EXAM: PORTABLE CHEST 1 VIEW COMPARISON:  03/27/2020. FINDINGS: Endotracheal tube, right IJ line in stable position. Cardiomegaly. No pulmonary venous congestion. Low lung volumes with mild bibasilar atelectasis/infiltrates. Tiny left pleural effusion cannot be excluded. No pneumothorax. IMPRESSION: 1.  Lines and tubes in stable position. 2.  Stable cardiomegaly.  No pulmonary venous congestion. 3. Low lung volumes with mild bibasilar atelectasis/infiltrates. Small left pleural effusion cannot be excluded. Similar findings noted on prior exam. Electronically Signed   By: Maisie Fus  Register   On: 03/28/2020 05:23   Portable Chest x-ray  Result Date: 03/27/2020 CLINICAL DATA:  Post intubation. EXAM: PORTABLE CHEST 1 VIEW COMPARISON:  Radiograph yesterday. FINDINGS: Endotracheal tube tip 5.6 cm in the carina. Right internal jugular central venous catheter tip projects over the upper SVC. Similar cardiomegaly. No pneumothorax. Low lung volumes. Minimal blunting of the costophrenic angles may be due to small effusions. Chronic interstitial coarsening. No confluent airspace disease. IMPRESSION: 1. Endotracheal tube tip 5.6 cm in the carina. 2. Right internal jugular central venous catheter tip projects over the upper SVC. No pneumothorax. 3. Low lung volumes. Similar cardiomegaly. Possible small pleural effusions. Electronically Signed   By: Narda Rutherford M.D.   On: 03/27/2020 21:16   DG HIP OPERATIVE UNILAT W  OR W/O PELVIS RIGHT  Result Date: 03/27/2020 CLINICAL DATA:  86 year old female with right femoral ORIF. EXAM: OPERATIVE right HIP (WITH PELVIS IF PERFORMED) 2 VIEWS TECHNIQUE: Fluoroscopic spot image(s) were submitted for interpretation post-operatively. COMPARISON:  None. FINDINGS: Eight intraoperative fluoroscopic spot images provided. The total fluoro time is 2 minutes 7 seconds and total air kerma of 20.6 mGy. Right femoral nail noted which appears  intact. IMPRESSION: Status post right femoral nail. Electronically Signed   By: Elgie CollardArash  Radparvar M.D.   On: 03/27/2020 18:28   DG FEMUR, MIN 2 VIEWS RIGHT  Result Date: 03/27/2020 CLINICAL DATA:  Postop femur fracture fixation. EXAM: RIGHT FEMUR 2 VIEWS COMPARISON:  Radiograph yesterday. FINDINGS: Intramedullary nail with trans trochanteric and distal locking screw with proximal cerclage wire fixation of proximal femur fracture. Fracture is in improved alignment compared to preoperative imaging. Recent postsurgical change includes air and edema in the soft tissues as well as skin staples. There are vascular calcifications. IMPRESSION: Post ORIF mid-proximal femur fracture without immediate postoperative complication. Electronically Signed   By: Narda RutherfordMelanie  Sanford M.D.   On: 03/27/2020 21:17    Assessment/Plan: 1 Day Post-Op   Active Problems:   Closed displaced fracture of proximal epiphysis of right femur [facility](HCC)  Patient is now extubated and was talkative and asking appropriate questions.  I recommend the patient continue her pressure dressing for another 1 to 2 days to reduce any postoperative bleeding.  Her hemoglobin has decreased.  It is possible the patient may require further transfusion due to her large fracture hematoma which is likely related to her being on Plavix as well as blood loss during surgery.  Orthopedics will continue to follow but defers to ICU team and medical specialist regarding patient's current medical care.  I answered all questions by the patient and her daughter.  Patient will be weightbearing as tolerated on the right lower extremity.  She would benefit from physical therapy once medically appropriate.  Patient should receive anticoagulation therapy postop.  Normally for femur fractures, patient is on orthopedics received 40 mg injection of Lovenox daily for at least 2 to 4 weeks.  Will defer to medical team regarding most appropriate anticoagulation for this patient  postop.   Juanell FairlyKevin Osten Janek , MD 03/28/2020, 5:11 PM

## 2020-03-28 NOTE — Anesthesia Postprocedure Evaluation (Signed)
Anesthesia Post Note  Patient: Alison Russo  Procedure(s) Performed: INTRAMEDULLARY (IM) NAIL INTERTROCHANTRIC (Right )  Patient location during evaluation: ICU Anesthesia Type: General Level of consciousness: sedated Pain management: pain level controlled Vital Signs Assessment: post-procedure vital signs reviewed and stable Respiratory status: patient remains intubated per anesthesia plan Cardiovascular status: stable (Pt remains on vasopressin and phenylephrine gtts) Postop Assessment: no apparent nausea or vomiting Anesthetic complications: no   No complications documented.   Last Vitals:  Vitals:   03/28/20 0600 03/28/20 0630  BP: (!) 136/102 124/83  Pulse:  100  Resp: 15 16  Temp:  36.6 C  SpO2:  97%    Last Pain:  Vitals:   03/28/20 0630  TempSrc: Oral  PainSc:                  Rosanne Gutting

## 2020-03-28 NOTE — Progress Notes (Signed)
Initial Nutrition Assessment  DOCUMENTATION CODES:   Obesity unspecified  INTERVENTION:   If tube feeds initiated, recommend:  Vital HP @50ml /hr + Prostat 70ml daily via tube  Free water flushes 32ml q4 hours to maintain tube patency   Regimen provides 1300kcal/day, 119g/day protein and 1147ml/day free water   Provide Liquid MVI daily via tube   NUTRITION DIAGNOSIS:   Inadequate oral intake related to inability to eat (pt sedated and ventilated) as evidenced by NPO status.  GOAL:   Provide needs based on ASPEN/SCCM guidelines  MONITOR:   Vent status, Labs, Weight trends, Skin, I & O's  REASON FOR ASSESSMENT:   Ventilator    ASSESSMENT:   86 y/o female with h/o CAD, NSTEMI and HTN who is admitted with hip fracture after fall now s/p ORIF 7/8. Pt remains ventilated after procedure r/t atrial fibrillation with RVR and severely hypotension requiring IV fluids and vasopressors.   Pt sedated and ventilated. No OGT in place. Family member at bedside reports pt with good appetite and oral intake at baseline; pt does not drink supplements at home. Per chart, pt is weight stable at baseline.   Medications reviewed and include: colace, solu-cortef, insulin, miralax, albumin, cefazolin, precedex, pepcid, fentanyl, LRS @75ml /hr, phenylephrine, vasopressin   Labs reviewed: BUN 27(H), Mg 1.5(L), Ca 7.3(L) adj. 8.66(L), alb 2.3(L) Wbc 21.0(H), Hgb 8.2(L), Hct 24.0(L) cbgs- 161, 164, 146 x 24 hrs  Patient is currently intubated on ventilator support MV: 8.1 L/min Temp (24hrs), Avg:98 F (36.7 C), Min:97.5 F (36.4 C), Max:99 F (37.2 C)  Propofol: none  MAP- >66mmHg  UOP- output   NUTRITION - FOCUSED PHYSICAL EXAM:    Most Recent Value  Orbital Region No depletion  Upper Arm Region No depletion  Thoracic and Lumbar Region No depletion  Buccal Region No depletion  Temple Region Moderate depletion  Clavicle Bone Region Mild depletion  Clavicle and Acromion Bone  Region Mild depletion  Scapular Bone Region No depletion  Dorsal Hand No depletion  Patellar Region No depletion  Anterior Thigh Region No depletion  Posterior Calf Region No depletion  Edema (RD Assessment) Mild  [BLE]  Hair Reviewed  Eyes Reviewed  Mouth Reviewed  Skin Reviewed  Nails Reviewed     Diet Order:   Diet Order            Diet NPO time specified  Diet effective now                EDUCATION NEEDS:   No education needs have been identified at this time  Skin:  Skin Assessment: Reviewed RN Assessment (ecchymosis, MASD, incision R hip)  Last BM:  7/8  Height:   Ht Readings from Last 1 Encounters:  03/27/20 5\' 6"  (1.676 m)    Weight:   Wt Readings from Last 1 Encounters:  03/27/20 89.8 kg    Ideal Body Weight:  59 kg  BMI:  Body mass index is 31.95 kg/m.  Estimated Nutritional Needs:   Kcal:  1000-1253kcal/day  Protein:  118g/day  Fluid:  1.5-1.8L/day  05/28/20 MS, RD, LDN Please refer to [facility] for RD and/or RD on-call/weekend/after hours pager

## 2020-03-28 NOTE — Progress Notes (Signed)
CH visited pt. while rounding on ICU; pt. extubated today, awake, talking w/dtr Steward Drone at bedside and w/medical team.  Dtr. said pt. suffered a fall Wednesday and fractured her femur; underwent surgery yesterday.  Pt. pleasantly confused per medication, but dtr. is very glad to see her talking and extubated.  Dtr. is from [place], [place] and says she thinks she will be spending a lot more time in [place] after pt.'s fall; pt.'s son and niece also live in this area.  No needs expressed at this time, but dtr. is aware of chaplain availability if needed.    03/28/20 1600  Clinical Encounter Type  Visited With Patient and family together  Visit Type Initial;Psychological support;Social support;Critical Care;Post-op  Referral From Other (Comment) (Routine Rounding)  Spiritual Encounters  Spiritual Needs Emotional  Stress Factors  Patient Stress Factors Loss of control;Major life changes;Health changes  Family Stress Factors Health changes;Loss of control;Major life changes

## 2020-03-28 NOTE — Procedures (Signed)
Central Venous Catheter Insertion Procedure Note  Alison Russo  [id number]  12-Apr-1926  Date:03/28/20  Time:12:07 AM    Provider Performing: Judithe Modest    Procedure: Insertion of Arterial Line (90240) without US guidance  Indication(s) Blood pressure monitoring and/or need for frequent ABGs  Consent Unable to obtain consent due to emergent nature of procedure.  Anesthesia None   Time Out Verified patient identification, verified procedure, site/side was marked, verified correct patient position, special equipment/implants available, medications/allergies/relevant history reviewed, required imaging and test results available.   Sterile Technique Maximal sterile technique including full sterile barrier drape, hand hygiene, sterile gown, sterile gloves, mask, hair covering, sterile ultrasound probe cover (if used).   Procedure Description Area of catheter insertion was cleaned with chlorhexidine and draped in sterile fashion. Without real-time ultrasound guidance an arterial catheter was placed into the left radial artery.  Appropriate arterial tracings confirmed on monitor.     Complications/Tolerance None; patient tolerated the procedure well.   EBL Minimal   Specimen(s) None   BIOPATCH applied to insertion site.     Harlon Ditty, AGACNP-BC [facility] Pager: [phone]

## 2020-03-28 NOTE — Plan of Care (Signed)

## 2020-03-28 NOTE — Progress Notes (Signed)
*  PRELIMINARY RESULTS* Echocardiogram 2D Echocardiogram has been performed.  Alison Russo 03/28/2020, 9:25 AM

## 2020-03-28 NOTE — Progress Notes (Signed)
Subjective:  POD #1 s/p open reduction and intramedullary fixation for a right femur fracture.   Patient remains intubated and sedated.  Patient developed atrial fibrillation and hypotension requiring pressors intraoperatively and continued postoperatively requiring transfer to the ICU.  Her daughter is at the bedside this AM.  Objective:   VITALS:   Vitals:   03/28/20 0738 03/28/20 0800 03/28/20 0815 03/28/20 0900  BP:  96/81  110/83  Pulse:  83  89  Resp:  16  17  Temp: 98.1 F (36.7 C)     TempSrc: Axillary     SpO2:  96% 96% 95%  Weight:      Height:        PHYSICAL EXAM: Right lower extremity: Patient's pressure dressing over the right thigh remains intact as well as clean and dry.  Patient has palpable pedal pulses distally.  LABS  Results for orders placed or performed during the hospital encounter of 03/26/20 (from the past 24 hour(s))  Prepare RBC (crossmatch)     Status: None   Collection Time: 03/27/20  4:45 PM  Result Value Ref Range   Order Confirmation      ORDER PROCESSED BY BLOOD BANK Performed at [facility], [address] [place], [place] [address]   Hemoglobin     Status: Abnormal   Collection Time: 03/27/20  4:50 PM  Result Value Ref Range   Hemoglobin 9.0 (L) 12.0 - 15.0 g/dL  Glucose, capillary     Status: Abnormal   Collection Time: 03/27/20  6:58 PM  Result Value Ref Range   Glucose-Capillary 202 (H) 70 - 99 mg/dL  CULTURE, BLOOD (ROUTINE X 2) w Reflex to ID Panel     Status: None (Preliminary result)   Collection Time: 03/27/20  8:57 PM   Specimen: BLOOD LEFT HAND  Result Value Ref Range   Specimen Description BLOOD LEFT HAND    Special Requests      BOTTLES DRAWN AEROBIC ONLY Blood Culture results may not be optimal due to an inadequate volume of blood received in culture bottles   Culture      NO GROWTH < 12 HOURS Performed at [facility], [address], [place], [place] [address]    Report Status PENDING    CULTURE, BLOOD (ROUTINE X 2) w Reflex to ID Panel     Status: None (Preliminary result)   Collection Time: 03/27/20  9:02 PM   Specimen: BLOOD RIGHT HAND  Result Value Ref Range   Specimen Description BLOOD RIGHT HAND    Special Requests      BOTTLES DRAWN AEROBIC ONLY Blood Culture adequate volume   Culture      NO GROWTH < 12 HOURS Performed at [facility], [address] [place], [place] [address]    Report Status PENDING   Draw ABG 1 hour after initiation of ventilator     Status: Abnormal   Collection Time: 03/27/20  9:19 PM  Result Value Ref Range   FIO2 0.40    Delivery systems VENTILATOR    Mode PRESSURE REGULATED VOLUME CONTROL    VT 450 mL   Peep/cpap 5.0 cm H20   pH, Arterial 7.26 (L) 7.35 - 7.45   pCO2 arterial 45 32 - 48 mmHg   pO2, Arterial 102 83 - 108 mmHg   Bicarbonate 20.2 20.0 - 28.0 mmol/L   Acid-base deficit 6.5 (H) 0.0 - 2.0 mmol/L   O2 Saturation 96.9 %   Patient temperature 37.0  Collection site RIGHT RADIAL    Sample type ARTERIAL DRAW    Allens test (pass/fail) PASS PASS   Mechanical Rate 15   CBC with Differential/Platelet     Status: Abnormal   Collection Time: 03/27/20  9:22 PM  Result Value Ref Range   WBC 17.6 (H) 4.0 - 10.5 K/uL   RBC 2.69 (L) 3.87 - 5.11 MIL/uL   Hemoglobin 8.5 (L) 12.0 - 15.0 g/dL   HCT 16.1 (L) 36 - 46 %   MCV 91.4 80.0 - 100.0 fL   MCH 31.6 26.0 - 34.0 pg   MCHC 34.6 30.0 - 36.0 g/dL   RDW 09.6 04.5 - 40.9 %   Platelets 85 (L) 150 - 400 K/uL   nRBC 0.0 0.0 - 0.2 %   Neutrophils Relative % 88 %   Neutro Abs 15.6 (H) 1.7 - 7.7 K/uL   Lymphocytes Relative 6 %   Lymphs Abs 1.1 0.7 - 4.0 K/uL   Monocytes Relative 5 %   Monocytes Absolute 0.9 0 - 1 K/uL   Eosinophils Relative 0 %   Eosinophils Absolute 0.0 0 - 0 K/uL   Basophils Relative 0 %   Basophils Absolute 0.0 0 - 0 K/uL   Immature Granulocytes 1 %   Abs Immature Granulocytes 0.10 (H) 0.00 - 0.07 K/uL  Comprehensive metabolic panel     Status:  Abnormal   Collection Time: 03/27/20  9:22 PM  Result Value Ref Range   Sodium 138 135 - 145 mmol/L   Potassium 4.5 3.5 - 5.1 mmol/L   Chloride 108 98 - 111 mmol/L   CO2 26 22 - 32 mmol/L   Glucose, Bld 189 (H) 70 - 99 mg/dL   BUN 24 (H) 8 - 23 mg/dL   Creatinine, Ser 8.11 0.44 - 1.00 mg/dL   Calcium 7.5 (L) 8.9 - 10.3 mg/dL   Total Protein 4.0 (L) 6.5 - 8.1 g/dL   Albumin 2.3 (L) 3.5 - 5.0 g/dL   AST 30 15 - 41 U/L   ALT 14 0 - 44 U/L   Alkaline Phosphatase 34 (L) 38 - 126 U/L   Total Bilirubin 1.4 (H) 0.3 - 1.2 mg/dL   GFR calc non Af Amer >60 >60 mL/min   GFR calc Af Amer >60 >60 mL/min   Anion gap 4 (L) 5 - 15  Troponin I (High Sensitivity)     Status: None   Collection Time: 03/27/20  9:22 PM  Result Value Ref Range   Troponin I (High Sensitivity) 12 <18 ng/L  Lactic acid, plasma     Status: Abnormal   Collection Time: 03/27/20  9:22 PM  Result Value Ref Range   Lactic Acid, Venous 2.9 (HH) 0.5 - 1.9 mmol/L  Procalcitonin - Baseline     Status: None   Collection Time: 03/27/20  9:22 PM  Result Value Ref Range   Procalcitonin <0.10 ng/mL  Protime-INR     Status: Abnormal   Collection Time: 03/27/20  9:22 PM  Result Value Ref Range   Prothrombin Time 18.1 (H) 11.4 - 15.2 seconds   INR 1.6 (H) 0.8 - 1.2  APTT     Status: None   Collection Time: 03/27/20  9:22 PM  Result Value Ref Range   aPTT 29 24 - 36 seconds  Urinalysis, Complete w Microscopic     Status: Abnormal   Collection Time: 03/27/20 10:40 PM  Result Value Ref Range   Color, Urine YELLOW (A) YELLOW   APPearance HAZY (  A) CLEAR   Specific Gravity, Urine 1.028 1.005 - 1.030   pH 5.0 5.0 - 8.0   Glucose, UA NEGATIVE NEGATIVE mg/dL   Hgb urine dipstick NEGATIVE NEGATIVE   Bilirubin Urine NEGATIVE NEGATIVE   Ketones, ur NEGATIVE NEGATIVE mg/dL   Protein, ur 30 (A) NEGATIVE mg/dL   Nitrite NEGATIVE NEGATIVE   Leukocytes,Ua NEGATIVE NEGATIVE   RBC / HPF 0-5 0 - 5 RBC/hpf   WBC, UA 11-20 0 - 5 WBC/hpf    Bacteria, UA RARE (A) NONE SEEN   Squamous Epithelial / LPF 0-5 0 - 5   Mucus PRESENT    Hyaline Casts, UA PRESENT   Glucose, capillary     Status: Abnormal   Collection Time: 03/27/20 10:57 PM  Result Value Ref Range   Glucose-Capillary 149 (H) 70 - 99 mg/dL  Basic metabolic panel     Status: Abnormal   Collection Time: 03/28/20 12:40 AM  Result Value Ref Range   Sodium 137 135 - 145 mmol/L   Potassium 4.5 3.5 - 5.1 mmol/L   Chloride 111 98 - 111 mmol/L   CO2 23 22 - 32 mmol/L   Glucose, Bld 222 (H) 70 - 99 mg/dL   BUN 25 (H) 8 - 23 mg/dL   Creatinine, Ser 3.97 0.44 - 1.00 mg/dL   Calcium 7.3 (L) 8.9 - 10.3 mg/dL   GFR calc non Af Amer >60 >60 mL/min   GFR calc Af Amer >60 >60 mL/min   Anion gap 3 (L) 5 - 15  CBC     Status: Abnormal   Collection Time: 03/28/20 12:40 AM  Result Value Ref Range   WBC 21.0 (H) 4.0 - 10.5 K/uL   RBC 2.51 (L) 3.87 - 5.11 MIL/uL   Hemoglobin 7.9 (L) 12.0 - 15.0 g/dL   HCT 67.3 (L) 36 - 46 %   MCV 90.8 80.0 - 100.0 fL   MCH 31.5 26.0 - 34.0 pg   MCHC 34.6 30.0 - 36.0 g/dL   RDW 41.9 37.9 - 02.4 %   Platelets 87 (L) 150 - 400 K/uL   nRBC 0.0 0.0 - 0.2 %  Magnesium     Status: Abnormal   Collection Time: 03/28/20 12:40 AM  Result Value Ref Range   Magnesium 1.5 (L) 1.7 - 2.4 mg/dL  Lactic acid, plasma     Status: Abnormal   Collection Time: 03/28/20 12:40 AM  Result Value Ref Range   Lactic Acid, Venous 2.9 (HH) 0.5 - 1.9 mmol/L  Troponin I (High Sensitivity)     Status: Abnormal   Collection Time: 03/28/20 12:40 AM  Result Value Ref Range   Troponin I (High Sensitivity) 20 (H) <18 ng/L  Prepare RBC (crossmatch)     Status: None   Collection Time: 03/28/20  1:08 AM  Result Value Ref Range   Order Confirmation      ORDER PROCESSED BY BLOOD BANK Performed at [facility], [address] [place], [place] [address]   Prepare fresh frozen plasma     Status: None (Preliminary result)   Collection Time: 03/28/20  1:09 AM  Result  Value Ref Range   Unit Number [id number]    Blood Component Type THW PLS APHR    Unit division B0    Status of Unit ISSUED    Transfusion Status      OK TO TRANSFUSE Performed at [facility], [address] [place], [place] [address]   Glucose, capillary     Status:  Abnormal   Collection Time: 03/28/20  3:41 AM  Result Value Ref Range   Glucose-Capillary 161 (H) 70 - 99 mg/dL  Blood gas, arterial     Status: Abnormal   Collection Time: 03/28/20  5:00 AM  Result Value Ref Range   FIO2 0.40    Delivery systems VENTILATOR    Mode PRESSURE REGULATED VOLUME CONTROL    VT 450 mL   Peep/cpap 5.0 cm H20   pH, Arterial 7.30 (L) 7.35 - 7.45   pCO2 arterial 41 32 - 48 mmHg   pO2, Arterial 105 83 - 108 mmHg   Bicarbonate 20.2 20.0 - 28.0 mmol/L   Acid-base deficit 5.9 (H) 0.0 - 2.0 mmol/L   O2 Saturation 97.4 %   Patient temperature 37.0    Collection site A-LINE    Sample type ARTERIAL DRAW    Mechanical Rate 15   Hemoglobin and hematocrit, blood     Status: Abnormal   Collection Time: 03/28/20  6:30 AM  Result Value Ref Range   Hemoglobin 10.0 (L) 12.0 - 15.0 g/dL   HCT 16.127.8 (L) 36 - 46 %  Glucose, capillary     Status: Abnormal   Collection Time: 03/28/20  7:46 AM  Result Value Ref Range   Glucose-Capillary 164 (H) 70 - 99 mg/dL    DG Chest 1 View  Result Date: 03/26/2020 CLINICAL DATA:  86 year old female with right femoral fracture. EXAM: CHEST  1 VIEW COMPARISON:  Chest radiograph dated 12/20/2017. FINDINGS: No focal consolidation, pleural effusion, or pneumothorax. Mild chronic interstitial coarsening. There is stable cardiomegaly. Osteopenia. No acute osseous pathology. IMPRESSION: No acute cardiopulmonary process. Electronically Signed   By: Elgie CollardArash  Radparvar M.D.   On: 03/26/2020 16:28   DG Pelvis 1-2 Views  Result Date: 03/26/2020 CLINICAL DATA:  86 year old female with fall and right hip pain. EXAM: RIGHT FEMUR 2 VIEWS; PELVIS - 1-2 VIEW COMPARISON:  CT  abdomen pelvis dated 01/19/2018. FINDINGS: There is a displaced oblique or spiral fracture of the proximal right femur extending from the femoral neck into the proximal femoral diaphysis. There is full shaft width medial displacement of the distal fracture fragment. There is no dislocation. The bones are osteopenic. Vascular calcification as well as calcified fibroid over the left pelvis. The soft tissues are grossly unremarkable. IMPRESSION: Displaced oblique or spiral fracture of the proximal right femur. Electronically Signed   By: Elgie CollardArash  Radparvar M.D.   On: 03/26/2020 16:28   DG Chest Port 1 View  Result Date: 03/28/2020 CLINICAL DATA:  Acute respiratory failure. EXAM: PORTABLE CHEST 1 VIEW COMPARISON:  03/27/2020. FINDINGS: Endotracheal tube, right IJ line in stable position. Cardiomegaly. No pulmonary venous congestion. Low lung volumes with mild bibasilar atelectasis/infiltrates. Tiny left pleural effusion cannot be excluded. No pneumothorax. IMPRESSION: 1.  Lines and tubes in stable position. 2.  Stable cardiomegaly.  No pulmonary venous congestion. 3. Low lung volumes with mild bibasilar atelectasis/infiltrates. Small left pleural effusion cannot be excluded. Similar findings noted on prior exam. Electronically Signed   By: Maisie Fushomas  Register   On: 03/28/2020 05:23   Portable Chest x-ray  Result Date: 03/27/2020 CLINICAL DATA:  Post intubation. EXAM: PORTABLE CHEST 1 VIEW COMPARISON:  Radiograph yesterday. FINDINGS: Endotracheal tube tip 5.6 cm in the carina. Right internal jugular central venous catheter tip projects over the upper SVC. Similar cardiomegaly. No pneumothorax. Low lung volumes. Minimal blunting of the costophrenic angles may be due to small effusions. Chronic interstitial coarsening. No confluent airspace disease. IMPRESSION: 1. Endotracheal  tube tip 5.6 cm in the carina. 2. Right internal jugular central venous catheter tip projects over the upper SVC. No pneumothorax. 3. Low lung  volumes. Similar cardiomegaly. Possible small pleural effusions. Electronically Signed   By: Narda Rutherford M.D.   On: 03/27/2020 21:16   DG HIP OPERATIVE UNILAT W OR W/O PELVIS RIGHT  Result Date: 03/27/2020 CLINICAL DATA:  86 year old female with right femoral ORIF. EXAM: OPERATIVE right HIP (WITH PELVIS IF PERFORMED) 2 VIEWS TECHNIQUE: Fluoroscopic spot image(s) were submitted for interpretation post-operatively. COMPARISON:  None. FINDINGS: Eight intraoperative fluoroscopic spot images provided. The total fluoro time is 2 minutes 7 seconds and total air kerma of 20.6 mGy. Right femoral nail noted which appears intact. IMPRESSION: Status post right femoral nail. Electronically Signed   By: Elgie Collard M.D.   On: 03/27/2020 18:28   DG FEMUR, MIN 2 VIEWS RIGHT  Result Date: 03/27/2020 CLINICAL DATA:  Postop femur fracture fixation. EXAM: RIGHT FEMUR 2 VIEWS COMPARISON:  Radiograph yesterday. FINDINGS: Intramedullary nail with trans trochanteric and distal locking screw with proximal cerclage wire fixation of proximal femur fracture. Fracture is in improved alignment compared to preoperative imaging. Recent postsurgical change includes air and edema in the soft tissues as well as skin staples. There are vascular calcifications. IMPRESSION: Post ORIF mid-proximal femur fracture without immediate postoperative complication. Electronically Signed   By: Narda Rutherford M.D.   On: 03/27/2020 21:17   DG Femur Min 2 Views Right  Result Date: 03/26/2020 CLINICAL DATA:  86 year old female with fall and right hip pain. EXAM: RIGHT FEMUR 2 VIEWS; PELVIS - 1-2 VIEW COMPARISON:  CT abdomen pelvis dated 01/19/2018. FINDINGS: There is a displaced oblique or spiral fracture of the proximal right femur extending from the femoral neck into the proximal femoral diaphysis. There is full shaft width medial displacement of the distal fracture fragment. There is no dislocation. The bones are osteopenic. Vascular  calcification as well as calcified fibroid over the left pelvis. The soft tissues are grossly unremarkable. IMPRESSION: Displaced oblique or spiral fracture of the proximal right femur. Electronically Signed   By: Elgie Collard M.D.   On: 03/26/2020 16:28    Assessment/Plan: 1 Day Post-Op   Active Problems:   Closed displaced fracture of proximal epiphysis of right femur [facility])  Appreciate ICU team management and consultation from Dr. Gwen Pounds.  Intubation and pressors per ICU team.  I reviewed the postoperative x-rays showing the fracture is well reduced.  Patient's dressing remains clean and dry and there is no evidence for active bleeding.  I will continue to follow the patient's progress.  Patient will begin physical therapy when medically cleared.  Anticoagulation per medical team.    Juanell Fairly , MD 03/28/2020, 9:56 AM

## 2020-03-28 NOTE — Progress Notes (Signed)
Patient successfully extubated to 2L [place] with no complications.Marland Kitchen

## 2020-03-28 NOTE — Progress Notes (Signed)
CRITICAL CARE PROGRESS NOTE    Name: Alison Russo MRN: [id number] DOB: 08-Jul-1926     LOS: 2   SUBJECTIVE FINDINGS & SIGNIFICANT EVENTS    Patient description:  86 y.o. Female admitted 03/26/20 with Right displaced Femoral Shaft Fracture.  She underwent Intramedullary Fixation of the right femoral artery shaft fracture on 03/27/20. Intraoperatively she was noted to have persistent venous oozing from the vastus lateralis and fracture site, with approximately 1200 cc of blood loss.  She was given 2 units pRBC's intraoperatively. She returns to ICU post procedure and remains intubated.  Upon arrival to ICU noted to have severe shock and atrial fibrillation with RVR.  SIGNIFICANT EVENTS  7/7: Admission to [facility] 7/8: Underwent Intramedullary Fixation of fracture  7/8: Returns to ICU post procedure and remains intubated; with severe shock and A-fib w/ RVR 7/8: Right IJ CVC placed 7/9- met with daughter at bedside and reivewed hospital course and care plan.   Lines/tubes : Airway 7 mm (Active)  Secured at (cm) 21 cm 03/28/20 1200  Measured From Lips 03/28/20 [place] 03/28/20 1200  Secured By [employer] 03/28/20 1121  Tube Holder Repositioned Yes 03/28/20 1200  Cuff Pressure (cm H2O) 26 cm H2O 03/28/20 0815  Site Condition Dry 03/28/20 1200     CVC Triple Lumen 03/27/20 Right Internal jugular (Active)  Indication for Insertion or Continuance of Line Vasoactive infusions 03/28/20 1200  Site Assessment Clean;Intact;Dry 03/28/20 1200  Proximal Lumen Status Infusing 03/28/20 1200  Medial Lumen Status Infusing 03/28/20 1200  Distal Lumen Status Infusing 03/28/20 1200  Dressing Type Transparent 03/28/20 1200  Dressing Status Clean;Dry;Intact;Antimicrobial disc in place 03/28/20  1200  Line Care Connections checked and tightened 03/28/20 1200  Dressing Change Due 04/02/20 03/28/20 0800     Arterial Line 03/28/20 Left Radial (Active)  Site Assessment Clean;Dry;Intact 03/28/20 1200  Line Status Pulsatile blood flow 03/28/20 1200  Art Line Waveform Appropriate;Square wave test performed 03/28/20 1200  Art Line Interventions Zeroed and calibrated;Leveled;Connections checked and tightened;Flushed per protocol 03/28/20 1200  Color/Movement/Sensation Capillary refill less than 3 sec 03/28/20 1200  Dressing Type Transparent 03/28/20 1200  Dressing Status Clean;Dry;Intact;Antimicrobial disc in place 03/28/20 1200  Dressing Change Due 04/03/20 03/28/20 0800     Urethral Catheter b kelly Double-lumen;Non-latex 16 Fr. (Active)  Indication for Insertion or Continuance of Catheter Peri-operative use for selective surgical procedure - not to exceed 24 hours post-op 03/28/20 0738  Site Assessment Clean;Intact;Dry 03/28/20 0758  Catheter Maintenance Bag below level of bladder;Drainage bag/tubing not touching floor;No dependent loops;Seal intact;Catheter secured;Insertion date on drainage bag;Bag emptied prior to transport 03/28/20 0758  Collection Container Standard drainage bag 03/28/20 0758  Securement Method Securing device (Describe) 03/28/20 0758  Urinary Catheter Interventions (if applicable) Unclamped 68/34/19 0758  Output (mL) 40 mL 03/28/20 1300    Microbiology/Sepsis markers: Results for orders placed or performed during the hospital encounter of 03/26/20  SARS Coronavirus 2 by RT PCR (hospital order, performed in [facility] hospital lab) Nasopharyngeal Nasopharyngeal Swab     Status: None   Collection Time: 03/26/20  5:00 PM   Specimen: Nasopharyngeal Swab  Result Value Ref Range Status   SARS Coronavirus 2 NEGATIVE NEGATIVE Final    Comment: (NOTE) SARS-CoV-2 target nucleic acids are NOT DETECTED.  The SARS-CoV-2 RNA is generally detectable in upper and  lower respiratory specimens during the acute phase of infection. The lowest concentration of SARS-CoV-2 viral copies this assay can detect is 250 copies /  mL. A negative result does not preclude SARS-CoV-2 infection and should not be used as the sole basis for treatment or other patient management decisions.  A negative result may occur with improper specimen collection / handling, submission of specimen other than nasopharyngeal swab, presence of viral mutation(s) within the areas targeted by this assay, and inadequate number of viral copies (<250 copies / mL). A negative result must be combined with clinical observations, patient history, and epidemiological information.  Fact Sheet for Patients:   StrictlyIdeas.no  Fact Sheet for Healthcare Providers: BankingDealers.co.za  This test is not yet approved or  cleared by the [place] FDA and has been authorized for detection and/or diagnosis of SARS-CoV-2 by FDA under an Emergency Use Authorization (EUA).  This EUA will remain in effect (meaning this test can be used) for the duration of the COVID-19 declaration under Section 564(b)(1) of the Act, 21 U.S.C. section 360bbb-3(b)(1), unless the authorization is terminated or revoked sooner.  Performed at [facility], [address] [place], [place] [address]   Surgical pcr screen     Status: Abnormal   Collection Time: 03/27/20  4:42 AM  Result Value Ref Range Status   MRSA, PCR NEGATIVE NEGATIVE Final   Staphylococcus aureus POSITIVE (A) NEGATIVE Final    Comment: (NOTE) The Xpert SA Assay (FDA approved for NASAL specimens in patients 20 years of age and older), is one component of a comprehensive surveillance program. It is not intended to diagnose infection nor to guide or monitor treatment. Performed at [facility], [address] [place], [place] [address]   CULTURE, BLOOD (ROUTINE X 2) w Reflex to  ID Panel     Status: None (Preliminary result)   Collection Time: 03/27/20  8:57 PM   Specimen: BLOOD LEFT HAND  Result Value Ref Range Status   Specimen Description BLOOD LEFT HAND  Final   Special Requests   Final    BOTTLES DRAWN AEROBIC ONLY Blood Culture results may not be optimal due to an inadequate volume of blood received in culture bottles   Culture   Final    NO GROWTH < 12 HOURS Performed at [facility], [address], [place], [place] [address]    Report Status PENDING  Incomplete  CULTURE, BLOOD (ROUTINE X 2) w Reflex to ID Panel     Status: None (Preliminary result)   Collection Time: 03/27/20  9:02 PM   Specimen: BLOOD RIGHT HAND  Result Value Ref Range Status   Specimen Description BLOOD RIGHT HAND  Final   Special Requests   Final    BOTTLES DRAWN AEROBIC ONLY Blood Culture adequate volume   Culture   Final    NO GROWTH < 12 HOURS Performed at [facility], [address], [place], [place] [address]    Report Status PENDING  Incomplete    Anti-infectives:  Anti-infectives (From admission, onward)   Start     Dose/Rate Route Frequency Ordered Stop   03/27/20 2330  ceFAZolin (ANCEF) IVPB 1 g/50 mL premix     Discontinue     1 g 100 mL/hr over 30 Minutes Intravenous Every 8 hours 03/27/20 2315     03/27/20 1843  gentamicin 80 mg in 0.9% sodium chloride 250 mL irrigation  Status:  Discontinued          As needed 03/27/20 1843 03/27/20 1848   03/27/20 1423  ceFAZolin (ANCEF) 2-4 GM/100ML-% IVPB       Note to Pharmacy: Theressa Millard,  Debra   : cabinet override      03/27/20 1423 03/27/20 1533   03/27/20 0600  ceFAZolin (ANCEF) IVPB 2g/100 mL premix        2 g 200 mL/hr over 30 Minutes Intravenous 30 min pre-op 03/26/20 2142 03/27/20 1528       Consults: Treatment Team:  Thornton Park, MD Corey Skains, MD     Tests / Events: TTE 03/28/20    PAST MEDICAL HISTORY   Past Medical History:  Diagnosis Date  . Anginal pain ([place])    . CAD (coronary artery disease)   . Heart murmur   . Hypertension   . Mood disorder ([place])    mostly anxiety  . NSTEMI (non-ST elevated myocardial infarction) ([place]) 12/22/2017  . OSA on CPAP   . PE (pulmonary embolism)    recurrent     SURGICAL HISTORY   Past Surgical History:  Procedure Laterality Date  . BREAST CYST ASPIRATION Right    neg  . CARDIAC CATHETERIZATION    . CATARACT EXTRACTION, BILATERAL    . COLONOSCOPY    . CORONARY STENT INTERVENTION N/A 12/23/2017   Procedure: CORONARY STENT INTERVENTION;  Surgeon: Martinique, Peter M, MD;  Location: [facility];  Service: Cardiovascular;  Laterality: N/A;  . EYE SURGERY Left    "blood vessel busted behind my eye"  . EYE SURGERY Right    ?vitrectomy  . HYSTEROSCOPY WITH D & C N/A 10/25/2016   Procedure: DILATATION AND CURETTAGE /HYSTEROSCOPY, POLYPECTOMY;  Surgeon: Benjaman Kindler, MD;  Location: [facility] OR[facility];  Service: Gynecology;  Laterality: N/A;  . INGUINAL HERNIA REPAIR Right 1950s  . INTRAMEDULLARY (IM) NAIL INTERTROCHANTERIC Right 03/27/2020   Procedure: INTRAMEDULLARY (IM) NAIL INTERTROCHANTRIC;  Surgeon: Thornton Park, MD;  Location: [facility] OR[facility];  Service: Orthopedics;  Laterality: Right;  . LEFT HEART CATH AND CORONARY ANGIOGRAPHY N/A 12/21/2017   Procedure: LEFT HEART CATH AND CORONARY ANGIOGRAPHY possible PCI and stent;  Surgeon: Yolonda Kida, MD;  Location: [facility];  Service: Cardiovascular;  Laterality: N/A;  . LOWER EXTREMITY ANGIOGRAPHY Right 01/11/2018   Procedure: LOWER EXTREMITY ANGIOGRAPHY;  Surgeon: Algernon Huxley, MD;  Location: [facility];  Service: Cardiovascular;  Laterality: Right;  . PSEUDOANERYSM COMPRESSION Left 01/20/2018   Procedure: PSEUDOANERYSM COMPRESSION;  Surgeon: Katha Cabal, MD;  Location: [facility];  Service: Cardiovascular;  Laterality: Left;  . TONSILLECTOMY       FAMILY HISTORY   Family History  Problem Relation Age of Onset  . Breast cancer  Mother 48  . AAA (abdominal aortic aneurysm) Father        ruptured  . Coronary artery disease Brother      SOCIAL HISTORY   Social History   Tobacco Use  . Smoking status: Never Smoker  . Smokeless tobacco: Never Used  Vaping Use  . Vaping Use: Never used  Substance Use Topics  . Alcohol use: Never  . Drug use: Never     MEDICATIONS   Current Medication:  Current Facility-Administered Medications:  .  0.9 %  sodium chloride infusion, , Intravenous, PRN, Ottie Glazier, MD, Stopped at 03/28/20 1256 .  albumin human 25 % solution 12.5 g, 12.5 g, Intravenous, Daily, Ottie Glazier, MD, Stopped at 03/28/20 1041 .  alum & mag hydroxide-simeth (MAALOX/MYLANTA) 200-200-20 MG/5ML suspension 15 mL, 15 mL, Oral, Q6H PRN, Enzo Bi, MD .  atorvastatin (LIPITOR) tablet 80 mg, 80 mg, Oral, q1800, Enzo Bi, MD .  calcium  carbonate (TUMS - dosed in mg elemental calcium) chewable tablet 200 mg of elemental calcium, 1 tablet, Oral, TID PRN, Enzo Bi, MD .  ceFAZolin (ANCEF) IVPB 1 g/50 mL premix, 1 g, Intravenous, Q8H, Bradly Bienenstock, NP, Stopped at 03/28/20 [phone]  chlorhexidine gluconate (MEDLINE KIT) (PERIDEX) 0.12 % solution 15 mL, 15 mL, Mouth Rinse, BID, Darel Hong D, NP, 15 mL at 03/28/20 0741 .  Chlorhexidine Gluconate Cloth 2 % PADS 6 each, 6 each, Topical, Daily, Darel Hong D, NP .  dexmedetomidine (PRECEDEX) 400 MCG/100ML (4 mcg/mL) infusion, 0.4-1.2 mcg/kg/hr, Intravenous, Titrated, Bradly Bienenstock, NP, Stopped at 03/28/20 1249 .  [START ON 03/29/2020] digoxin (LANOXIN) 0.25 MG/ML injection 0.125 mg, 0.125 mg, Intravenous, Daily, Lanney Gins, Alanta [place], MD .  docusate (COLACE) 50 MG/5ML liquid 100 mg, 100 mg, Oral, BID, Darel Hong D, NP .  docusate sodium (COLACE) capsule 100 mg, 100 mg, Oral, BID PRN, Enzo Bi, MD .  famotidine (PEPCID) IVPB 20 mg premix, 20 mg, Intravenous, Q12H, Bradly Bienenstock, NP, Stopped at 03/28/20 1132 .  fentaNYL (SUBLIMAZE) bolus via  infusion 25 mcg, 25 mcg, Intravenous, Q15 min PRN, Darel Hong D, NP .  fentaNYL (SUBLIMAZE) injection 25 mcg, 25 mcg, Intravenous, Once, Darel Hong D, NP .  fentaNYL 257mg in NS 2528m(1014mml) infusion-PREMIX, 25-200 mcg/hr, Intravenous, Continuous, KeeDarel Hong NP, Last Rate: 5 mL/hr at 03/28/20 1300, 50 mcg/hr at 03/28/20 1300 .  guaiFENesin-dextromethorphan (ROBITUSSIN DM) 100-10 MG/5ML syrup 10 mL, 10 mL, Oral, Q6H PRN, LaiEnzo BiD .  hydrocortisone sodium succinate (SOLU-CORTEF) 100 MG injection 50 mg, 50 mg, Intravenous, Q6H, KeeDarel Hong NP, 50 mg at 03/28/20 0842 .  insulin aspart (novoLOG) injection 0-15 Units, 0-15 Units, Subcutaneous, Q4H, KeeDarel Hong NP, 1 Units at 03/28/20 1143 .  ipratropium-albuterol (DUONEB) 0.5-2.5 (3) MG/3ML nebulizer solution 3 mL, 3 mL, Nebulization, Q4H PRN, KeeBradly BienenstockP .  lactated ringers infusion, , Intravenous, Continuous, AdaMolli BarrowsD, Last Rate: 75 mL/hr at 03/28/20 1300, Rate Verify at 03/28/20 1300 .  MEDLINE mouth rinse, 15 mL, Mouth Rinse, 10 times per day, KeeDarel Hong NP, 15 mL at 03/28/20 1236 .  methocarbamol (ROBAXIN) 500 mg in dextrose 5 % 50 mL IVPB, 500 mg, Intravenous, Q8H PRN, KraThornton ParkD, Stopped at 03/27/20 1322 .  midazolam (VERSED) injection 1 mg, 1 mg, Intravenous, Q15 min PRN, KeeDarel Hong NP .  midazolam (VERSED) injection 1 mg, 1 mg, Intravenous, Q2H PRN, KeeDarel Hong NP, 1 mg at 03/27/20 2235 .  morphine 4 MG/ML injection 4 mg, 4 mg, Intravenous, Q3H PRN, LaiEnzo BiD, 4 mg at 03/27/20 0810 .  ondansetron (ZOFRAN) injection 4 mg, 4 mg, Intravenous, Q6H PRN, LaiEnzo BiD .  ondansetron (ZOFRAN-ODT) disintegrating tablet 4 mg, 4 mg, Oral, Q8H PRN, LaiEnzo BiD .  phenylephrine CONCENTRATED 100m16m sodium chloride 0.9% 250mL50m4mg/m34minfusion, 0-400 mcg/min, Intravenous, Titrated, Keene,Darel Hong, Last Rate: 6 mL/hr at 03/28/20 1300, 40 mcg/min at  03/28/20 1300 .  polyethylene glycol (MIRALAX / GLYCOLAX) packet 17 g, 17 g, Oral, BID PRN, Lai, TEnzo Bi  polyethylene glycol (MIRALAX / GLYCOLAX) packet 17 g, 17 g, Oral, Daily, Keene,Darel Hong .  sertraline (ZOLOFT) tablet 100 mg, 100 mg, Oral, Daily, Lai, TEnzo Bi  sodium chloride flush (NS) 0.9 % injection 10-40 mL, 10-40 mL, Intracatheter, Q12H, Keene,Darel Hong, 10 mL at 03/28/20 0245 .  sodium chloride  flush (NS) 0.9 % injection 10-40 mL, 10-40 mL, Intracatheter, PRN, Darel Hong D, NP .  traZODone (DESYREL) tablet 25 mg, 25 mg, Oral, QHS PRN, Enzo Bi, MD .  vasopressin (PITRESSIN) 20 Units in sodium chloride 0.9 % 100 mL infusion-*FOR SHOCK*, 0-0.03 Units/min, Intravenous, Continuous, Darel Hong D, NP, Last Rate: 9 mL/hr at 03/28/20 1300, 0.03 Units/min at 03/28/20 1300    ALLERGIES   Patient has no known allergies.    REVIEW OF SYSTEMS    Unable to obtain due to MV  PHYSICAL EXAMINATION   Vital Signs: Temp:  [97.5 F (36.4 C)-99 F (37.2 C)] 99 F (37.2 C) (07/09 1200) Pulse Rate:  [83-105] 97 (07/09 1300) Resp:  [14-19] 18 (07/09 1300) BP: (92-136)/(61-102) 111/72 (07/09 1300) SpO2:  [93 %-100 %] 93 % (07/09 1300) Arterial Line BP: (88-194)/(62-96) 117/70 (07/09 1300) FiO2 (%):  [28 %-40 %] 28 % (07/09 1200) Weight:  [89.8 kg] 89.8 kg (07/08 1428)  GENERAL:Sedated on MV , age approprate HEAD: Normocephalic, atraumatic.  EYES: Pupils equal, round, reactive to light.  No scleral icterus.  MOUTH: Moist mucosal membrane. NECK: Supple. No thyromegaly. No nodules. No JVD.  PULMONARY: mild rhonchi CARDIOVASCULAR: S1 and S2. Regular rate and rhythm. No murmurs, rubs, or gallops.  GASTROINTESTINAL: Soft, nontender, non-distended. No masses. Positive bowel sounds. No hepatosplenomegaly.  MUSCULOSKELETAL: No swelling, clubbing, or edema.  NEUROLOGIC: GCS4T SKIN:intact,warm,dry   PERTINENT DATA     Infusions: . sodium chloride Stopped  (03/28/20 1256)  . albumin human Stopped (03/28/20 1041)  .  ceFAZolin (ANCEF) IV Stopped (03/28/20 0657)  . dexmedetomidine (PRECEDEX) IV infusion Stopped (03/28/20 1249)  . famotidine (PEPCID) IV Stopped (03/28/20 1132)  . fentaNYL infusion INTRAVENOUS 50 mcg/hr (03/28/20 1300)  . lactated ringers 75 mL/hr at 03/28/20 1300  . methocarbamol (ROBAXIN) IV Stopped (03/27/20 1322)  . phenylephrine (NEO-SYNEPHRINE) Adult infusion 40 mcg/min (03/28/20 1300)  . vasopressin 0.03 Units/min (03/28/20 1300)   Scheduled Medications: . atorvastatin  80 mg Oral q1800  . chlorhexidine gluconate (MEDLINE KIT)  15 mL Mouth Rinse BID  . Chlorhexidine Gluconate Cloth  6 each Topical Daily  . [START ON 03/29/2020] digoxin  0.125 mg Intravenous Daily  . docusate  100 mg Oral BID  . fentaNYL (SUBLIMAZE) injection  25 mcg Intravenous Once  . hydrocortisone sod succinate (SOLU-CORTEF) inj  50 mg Intravenous Q6H  . insulin aspart  0-15 Units Subcutaneous Q4H  . mouth rinse  15 mL Mouth Rinse 10 times per day  . polyethylene glycol  17 g Oral Daily  . sertraline  100 mg Oral Daily  . sodium chloride flush  10-40 mL Intracatheter Q12H   PRN Medications: sodium chloride, alum & mag hydroxide-simeth, calcium carbonate, docusate sodium, fentaNYL, guaiFENesin-dextromethorphan, ipratropium-albuterol, methocarbamol (ROBAXIN) IV, midazolam, midazolam, morphine injection, ondansetron (ZOFRAN) IV, ondansetron, polyethylene glycol, sodium chloride flush, traZODone Hemodynamic parameters: CVP:  [3 mmHg-9 mmHg] 3 mmHg Intake/Output: 07/08 0701 - 07/09 0700 In: 4650 [I.V.:4876.7; Blood:1396; IV Piggyback:1800.3] Out: 3546 [Urine:530; Blood:1200]  Ventilator  Settings: Vent Mode: PRVC FiO2 (%):  [28 %-40 %] 28 % Set Rate:  [15 bmp] 15 bmp Vt Set:  [450 mL] 450 mL PEEP:  [5 cmH20] 5 cmH20 Plateau Pressure:  [12 cmH20] 12 cmH20   LAB RESULTS:  Basic Metabolic Panel: Recent Labs  Lab 03/26/20 1506 03/26/20 1506  03/27/20 0509 03/27/20 0509 03/27/20 2122 03/27/20 2122 03/28/20 0040 03/28/20 1249  NA 142  --  139  --  138  --  137  138  K 3.7   < > 4.4   < > 4.5   < > 4.5 4.3  CL 110  --  108  --  108  --  111 106  CO2 24  --  23  --  26  --  23  --   GLUCOSE 166*  --  139*  --  189*  --  222* 160*  BUN 17  --  18  --  24*  --  25* 27*  CREATININE 0.75  --  0.76  --  0.82  --  0.81 0.80  CALCIUM 8.9  --  8.8*  --  7.5*  --  7.3*  --   MG  --   --  1.9  --   --   --  1.5*  --    < > = values in this interval not displayed.   Liver Function Tests: Recent Labs  Lab 03/27/20 2122  AST 30  ALT 14  ALKPHOS 34*  BILITOT 1.4*  PROT 4.0*  ALBUMIN 2.3*   No results for input(s): LIPASE, AMYLASE in the last 168 hours. No results for input(s): AMMONIA in the last 168 hours. CBC: Recent Labs  Lab 03/26/20 1506 03/26/20 1506 03/27/20 0509 03/27/20 0509 03/27/20 1650 03/27/20 2122 03/28/20 0040 03/28/20 0630 03/28/20 1249  WBC 8.9  --  13.5*  --   --  17.6* 21.0*  --   --   NEUTROABS 6.7  --   --   --   --  15.6*  --   --   --   HGB 12.6   < > 10.7*   < > 9.0* 8.5* 7.9* 10.0* 8.2*  HCT 36.2   < > 32.4*  --   --  24.6* 22.8* 27.8* 24.0*  MCV 87.7  --  91.5  --   --  91.4 90.8  --   --   PLT 148*  --  137*  --   --  85* 87*  --   --    < > = values in this interval not displayed.   Cardiac Enzymes: No results for input(s): CKTOTAL, CKMB, CKMBINDEX, TROPONINI in the last 168 hours. BNP: Invalid input(s): POCBNP CBG: Recent Labs  Lab 03/27/20 1858 03/27/20 2257 03/28/20 0341 03/28/20 0746 03/28/20 1135  GLUCAP 202* 149* 161* 164* 146*       IMAGING RESULTS:  Imaging: DG Chest 1 View  Result Date: 03/26/2020 CLINICAL DATA:  86 year old female with right femoral fracture. EXAM: CHEST  1 VIEW COMPARISON:  Chest radiograph dated 12/20/2017. FINDINGS: No focal consolidation, pleural effusion, or pneumothorax. Mild chronic interstitial coarsening. There is stable cardiomegaly.  Osteopenia. No acute osseous pathology. IMPRESSION: No acute cardiopulmonary process. Electronically Signed   By: Anner Crete M.D.   On: 03/26/2020 16:28   DG Pelvis 1-2 Views  Result Date: 03/26/2020 CLINICAL DATA:  86 year old female with fall and right hip pain. EXAM: RIGHT FEMUR 2 VIEWS; PELVIS - 1-2 VIEW COMPARISON:  CT abdomen pelvis dated 01/19/2018. FINDINGS: There is a displaced oblique or spiral fracture of the proximal right femur extending from the femoral neck into the proximal femoral diaphysis. There is full shaft width medial displacement of the distal fracture fragment. There is no dislocation. The bones are osteopenic. Vascular calcification as well as calcified fibroid over the left pelvis. The soft tissues are grossly unremarkable. IMPRESSION: Displaced oblique or spiral fracture of the proximal right femur. Electronically Signed   By: Milas Hock  Radparvar M.D.   On: 03/26/2020 16:28   DG Chest Port 1 View  Result Date: 03/28/2020 CLINICAL DATA:  Acute respiratory failure. EXAM: PORTABLE CHEST 1 VIEW COMPARISON:  03/27/2020. FINDINGS: Endotracheal tube, right IJ line in stable position. Cardiomegaly. No pulmonary venous congestion. Low lung volumes with mild bibasilar atelectasis/infiltrates. Tiny left pleural effusion cannot be excluded. No pneumothorax. IMPRESSION: 1.  Lines and tubes in stable position. 2.  Stable cardiomegaly.  No pulmonary venous congestion. 3. Low lung volumes with mild bibasilar atelectasis/infiltrates. Small left pleural effusion cannot be excluded. Similar findings noted on prior exam. Electronically Signed   By: Marcello Moores  Register   On: 03/28/2020 05:23   Portable Chest x-ray  Result Date: 03/27/2020 CLINICAL DATA:  Post intubation. EXAM: PORTABLE CHEST 1 VIEW COMPARISON:  Radiograph yesterday. FINDINGS: Endotracheal tube tip 5.6 cm in the carina. Right internal jugular central venous catheter tip projects over the upper SVC. Similar cardiomegaly. No  pneumothorax. Low lung volumes. Minimal blunting of the costophrenic angles may be due to small effusions. Chronic interstitial coarsening. No confluent airspace disease. IMPRESSION: 1. Endotracheal tube tip 5.6 cm in the carina. 2. Right internal jugular central venous catheter tip projects over the upper SVC. No pneumothorax. 3. Low lung volumes. Similar cardiomegaly. Possible small pleural effusions. Electronically Signed   By: Keith Rake M.D.   On: 03/27/2020 21:16   DG HIP OPERATIVE UNILAT W OR W/O PELVIS RIGHT  Result Date: 03/27/2020 CLINICAL DATA:  86 year old female with right femoral ORIF. EXAM: OPERATIVE right HIP (WITH PELVIS IF PERFORMED) 2 VIEWS TECHNIQUE: Fluoroscopic spot image(s) were submitted for interpretation post-operatively. COMPARISON:  None. FINDINGS: Eight intraoperative fluoroscopic spot images provided. The total fluoro time is 2 minutes 7 seconds and total air kerma of 20.6 mGy. Right femoral nail noted which appears intact. IMPRESSION: Status post right femoral nail. Electronically Signed   By: Anner Crete M.D.   On: 03/27/2020 18:28   DG FEMUR, MIN 2 VIEWS RIGHT  Result Date: 03/27/2020 CLINICAL DATA:  Postop femur fracture fixation. EXAM: RIGHT FEMUR 2 VIEWS COMPARISON:  Radiograph yesterday. FINDINGS: Intramedullary nail with trans trochanteric and distal locking screw with proximal cerclage wire fixation of proximal femur fracture. Fracture is in improved alignment compared to preoperative imaging. Recent postsurgical change includes air and edema in the soft tissues as well as skin staples. There are vascular calcifications. IMPRESSION: Post ORIF mid-proximal femur fracture without immediate postoperative complication. Electronically Signed   By: Keith Rake M.D.   On: 03/27/2020 21:17   DG Femur Min 2 Views Right  Result Date: 03/26/2020 CLINICAL DATA:  86 year old female with fall and right hip pain. EXAM: RIGHT FEMUR 2 VIEWS; PELVIS - 1-2 VIEW  COMPARISON:  CT abdomen pelvis dated 01/19/2018. FINDINGS: There is a displaced oblique or spiral fracture of the proximal right femur extending from the femoral neck into the proximal femoral diaphysis. There is full shaft width medial displacement of the distal fracture fragment. There is no dislocation. The bones are osteopenic. Vascular calcification as well as calcified fibroid over the left pelvis. The soft tissues are grossly unremarkable. IMPRESSION: Displaced oblique or spiral fracture of the proximal right femur. Electronically Signed   By: Anner Crete M.D.   On: 03/26/2020 16:28   '@[email or web address]' @ DG Chest Port 1 View  Result Date: 03/28/2020 CLINICAL DATA:  Acute respiratory failure. EXAM: PORTABLE CHEST 1 VIEW COMPARISON:  03/27/2020. FINDINGS: Endotracheal tube, right IJ line in stable position. Cardiomegaly. No pulmonary venous congestion. Low lung volumes with  mild bibasilar atelectasis/infiltrates. Tiny left pleural effusion cannot be excluded. No pneumothorax. IMPRESSION: 1.  Lines and tubes in stable position. 2.  Stable cardiomegaly.  No pulmonary venous congestion. 3. Low lung volumes with mild bibasilar atelectasis/infiltrates. Small left pleural effusion cannot be excluded. Similar findings noted on prior exam. Electronically Signed   By: Marcello Moores  Register   On: 03/28/2020 05:23   Portable Chest x-ray  Result Date: 03/27/2020 CLINICAL DATA:  Post intubation. EXAM: PORTABLE CHEST 1 VIEW COMPARISON:  Radiograph yesterday. FINDINGS: Endotracheal tube tip 5.6 cm in the carina. Right internal jugular central venous catheter tip projects over the upper SVC. Similar cardiomegaly. No pneumothorax. Low lung volumes. Minimal blunting of the costophrenic angles may be due to small effusions. Chronic interstitial coarsening. No confluent airspace disease. IMPRESSION: 1. Endotracheal tube tip 5.6 cm in the carina. 2. Right internal jugular central venous catheter tip projects over the upper SVC. No  pneumothorax. 3. Low lung volumes. Similar cardiomegaly. Possible small pleural effusions. Electronically Signed   By: Keith Rake M.D.   On: 03/27/2020 21:16   DG HIP OPERATIVE UNILAT W OR W/O PELVIS RIGHT  Result Date: 03/27/2020 CLINICAL DATA:  86 year old female with right femoral ORIF. EXAM: OPERATIVE right HIP (WITH PELVIS IF PERFORMED) 2 VIEWS TECHNIQUE: Fluoroscopic spot image(s) were submitted for interpretation post-operatively. COMPARISON:  None. FINDINGS: Eight intraoperative fluoroscopic spot images provided. The total fluoro time is 2 minutes 7 seconds and total air kerma of 20.6 mGy. Right femoral nail noted which appears intact. IMPRESSION: Status post right femoral nail. Electronically Signed   By: Anner Crete M.D.   On: 03/27/2020 18:28   DG FEMUR, MIN 2 VIEWS RIGHT  Result Date: 03/27/2020 CLINICAL DATA:  Postop femur fracture fixation. EXAM: RIGHT FEMUR 2 VIEWS COMPARISON:  Radiograph yesterday. FINDINGS: Intramedullary nail with trans trochanteric and distal locking screw with proximal cerclage wire fixation of proximal femur fracture. Fracture is in improved alignment compared to preoperative imaging. Recent postsurgical change includes air and edema in the soft tissues as well as skin staples. There are vascular calcifications. IMPRESSION: Post ORIF mid-proximal femur fracture without immediate postoperative complication. Electronically Signed   By: Keith Rake M.D.   On: 03/27/2020 21:17     ASSESSMENT AND PLAN    -Multidisciplinary rounds held today  Post-operative respiratory failure   - due to profound circulatory shock with AFrVR and acute blood loss anemia.    - remains on MV at this time, still requiring multiple vasopressors to reach MAP>65   -1l LR bolus   - h/h recheck and replete -continue Full MV support -continue Bronchodilator Therapy -Wean Fio2 and PEEP as tolerated -will perform SAT/SBT when respiratory parameters are met   Acute blood  loss anemia   - due to hip fracture with large hematoma which was evacuated    - blood was repleted perioperatively   - monitor h/h ICU monitoring via telemetry   Atrial fibrillation with rapid ventricular response   - s/p amiodarone and IVF    -patient is rate controlled and much improved clinically   Displaced fracture of the proximal right femur s/p Intramedullary Fixation on 03/27/20 -Ortho following, will follow recommendations   ID -continue IV abx as prescibed -follow up cultures  GI/Nutrition GI PROPHYLAXIS as indicated DIET-->TF's as tolerated Constipation protocol as indicated  ENDO - ICU hypoglycemic\Hyperglycemia protocol -check FSBS per protocol   ELECTROLYTES -follow labs as needed -replace as needed -pharmacy consultation   DVT/GI PRX ordered -SCDs  TRANSFUSIONS AS  NEEDED MONITOR FSBS ASSESS the need for LABS as needed   Critical care provider statement:    Critical care time (minutes):  33   Critical care time was exclusive of:  Separately billable procedures and treating other patients   Critical care was necessary to treat or prevent imminent or life-threatening deterioration of the following conditions:  acute hip fracture, acute blood loss anemia, AFRVR, shock   Critical care was time spent personally by me on the following activities:  Development of treatment plan with patient or surrogate, discussions with consultants, evaluation of patient's response to treatment, examination of patient, obtaining history from patient or surrogate, ordering and performing treatments and interventions, ordering and review of laboratory studies and re-evaluation of patient's condition.  I assumed direction of critical care for this patient from another provider in my specialty: no    This document was prepared using Dragon voice recognition software and may include unintentional dictation errors.    Ottie Glazier, M.D.  Division of [place]

## 2020-03-28 NOTE — Progress Notes (Addendum)
Shift summary:  - Patient remains intubated, sedated, and on 2 pressors.  - Will wean gtt's as toelrated. - Extubated to [place] this afternoon; remains on pressors.

## 2020-03-28 NOTE — Consult Note (Signed)
PHARMACY CONSULT NOTE  Pharmacy Consult for Electrolyte Monitoring and Replacement   Recent Labs: Potassium (mmol/L)  Date Value  03/28/2020 4.5   Magnesium (mg/dL)  Date Value  83/33/8329 1.5 (L)   Calcium (mg/dL)  Date Value  19/16/6060 7.3 (L)   Albumin (g/dL)  Date Value  04/59/9774 2.3 (L)  09/28/2017 3.9   Sodium (mmol/L)  Date Value  03/28/2020 137  09/28/2017 143   Corrected Ca: 8.66 mg/dL  Assessment: 86 y.o. Female w/ PMH CAD, NSTEMI s/p stents, HTN admitted 03/26/20 with Right displaced Femoral Shaft Fracture, s/p Intramedullary Fixation of the right femoral artery shaft fracture on 03/27/20.  Goal of Therapy:  Potassium 4.0 - 5.1 mmol/L Magnesium 2.0 - 2.4 mg/dL All Other Electrolytes WNL  Plan:   2 grams IV magnesium sulfate x 1  F/U electrolytes in am 7/10  Lowella Bandy ,PharmD Clinical Pharmacist 03/28/2020 10:29 AM

## 2020-03-29 LAB — BPAM FFP
Blood Product Expiration Date: 202107142359
ISSUE DATE / TIME: 202107090504
Unit Type and Rh: 6200

## 2020-03-29 LAB — CBC
HCT: 22.6 % — ABNORMAL LOW (ref 36.0–46.0)
Hemoglobin: 7.9 g/dL — ABNORMAL LOW (ref 12.0–15.0)
MCH: 31.5 pg (ref 26.0–34.0)
MCHC: 35 g/dL (ref 30.0–36.0)
MCV: 90 fL (ref 80.0–100.0)
Platelets: 85 10*3/uL — ABNORMAL LOW (ref 150–400)
RBC: 2.51 MIL/uL — ABNORMAL LOW (ref 3.87–5.11)
RDW: 15 % (ref 11.5–15.5)
WBC: 18.2 10*3/uL — ABNORMAL HIGH (ref 4.0–10.5)
nRBC: 0.4 % — ABNORMAL HIGH (ref 0.0–0.2)

## 2020-03-29 LAB — PREPARE FRESH FROZEN PLASMA

## 2020-03-29 LAB — BASIC METABOLIC PANEL
Anion gap: 7 (ref 5–15)
BUN: 35 mg/dL — ABNORMAL HIGH (ref 8–23)
CO2: 24 mmol/L (ref 22–32)
Calcium: 7.4 mg/dL — ABNORMAL LOW (ref 8.9–10.3)
Chloride: 106 mmol/L (ref 98–111)
Creatinine, Ser: 0.99 mg/dL (ref 0.44–1.00)
GFR calc Af Amer: 57 mL/min — ABNORMAL LOW (ref >60)
GFR calc non Af Amer: 49 mL/min — ABNORMAL LOW (ref >60)
Glucose, Bld: 145 mg/dL — ABNORMAL HIGH (ref 70–99)
Potassium: 3.6 mmol/L (ref 3.5–5.1)
Sodium: 137 mmol/L (ref 135–145)

## 2020-03-29 LAB — DIGOXIN LEVEL: Digoxin Level: 0.4 ng/mL — ABNORMAL LOW (ref 0.8–2.0)

## 2020-03-29 LAB — HEMOGLOBIN A1C
Hgb A1c MFr Bld: 5.6 % (ref 4.8–5.6)
Mean Plasma Glucose: 114.02 mg/dL

## 2020-03-29 LAB — GLUCOSE, CAPILLARY
Glucose-Capillary: 146 mg/dL — ABNORMAL HIGH (ref 70–99)
Glucose-Capillary: 128 mg/dL — ABNORMAL HIGH (ref 70–99)
Glucose-Capillary: 139 mg/dL — ABNORMAL HIGH (ref 70–99)
Glucose-Capillary: 121 mg/dL — ABNORMAL HIGH (ref 70–99)
Glucose-Capillary: 113 mg/dL — ABNORMAL HIGH (ref 70–99)

## 2020-03-29 LAB — PREPARE RBC (CROSSMATCH)

## 2020-03-29 LAB — MAGNESIUM: Magnesium: 2 mg/dL (ref 1.7–2.4)

## 2020-03-29 MED ORDER — FUROSEMIDE 10 MG/ML IJ SOLN
40.0000 mg | Freq: Once | INTRAMUSCULAR | Status: AC
  Administered 2020-03-29: 40 mg via INTRAVENOUS
  Filled 2020-03-29: qty 4

## 2020-03-29 MED ORDER — ALPRAZOLAM 0.25 MG PO TABS: 0.2500 mg | ORAL_TABLET | Freq: Once | ORAL | Status: DC

## 2020-03-29 MED ORDER — POTASSIUM CHLORIDE 10 MEQ/100ML IV SOLN
10.0000 meq | INTRAVENOUS | Status: AC
  Administered 2020-03-29 (×2): 10 meq via INTRAVENOUS
  Filled 2020-03-29 (×2): qty 100

## 2020-03-29 MED ORDER — SODIUM CHLORIDE 0.9% IV SOLUTION: Freq: Once | INTRAVENOUS | Status: AC

## 2020-03-29 NOTE — Progress Notes (Signed)
Pt resting in bed with eyes closed. Pt has been c/o not being able to sleep or rest. One time dose of xanax has been ordered for bedtime. VSS. Pt is on 3L [place] at this time. Neo and vaso gtt has been stopped. BP stable. Daughter has been at bedside throughout shift. Right leg has original ORO dressing without drainage. Pt denies pain at this time.

## 2020-03-29 NOTE — Progress Notes (Signed)
Subjective: 2 Days Post-Op Procedure(s) (LRB): INTRAMEDULLARY (IM) NAIL INTERTROCHANTRIC (Right)   Patient is awake and fairly alert.  Her daughter is at the bedside.  She reports moderate discomfort, mostly in the groin region.  She is fairly stable but still on some vasopressors.  Hemoglobin is 7.9 today.  ACE wraps were removed and then replaced from the toes to the groin to provide gentle compression.  Incisions are dry otherwise. She will remain in ICU overnight.  Patient reports pain as mild.  Objective:   VITALS:   Vitals:   03/29/20 1300 03/29/20 1400  BP: 100/62 105/66  Pulse: (!) 125 98  Resp: (!) 23 (!) 25  Temp:    SpO2: (!) 72% 95%    Neurologically intact Sensation intact distally Intact pulses distally  LABS Recent Labs    03/28/20 0040 03/28/20 0630 03/28/20 1450 03/28/20 2300 03/29/20 0519  HGB 7.9*   < > 7.9* 7.0* 7.9*  HCT 22.8*   < > 22.5* 20.4* 22.6*  WBC 21.0*  --   --  18.5* 18.2*  PLT 87*  --   --  85* 85*   < > = values in this interval not displayed.    Recent Labs    03/28/20 0040 03/28/20 1249 03/29/20 0519  NA 137 138 137  K 4.5 4.3 3.6  BUN 25* 27* 35*  CREATININE 0.81 0.80 0.99  GLUCOSE 222* 160* 145*    Recent Labs    03/26/20 1506 03/27/20 2122  INR 1.1 1.6*     Assessment/Plan: 2 Days Post-Op Procedure(s) (LRB): INTRAMEDULLARY (IM) NAIL INTERTROCHANTRIC (Right)   Up with therapy when stable. Will require several more days of care.

## 2020-03-29 NOTE — Progress Notes (Signed)
[facility] Encounter Note  Patient: Alison Russo / Admit Date: 03/26/2020 / Date of Encounter: 03/29/2020, 8:57 AM   Subjective: Patient appears to be tolerating extubation well. She is getting inhalers and pulmonary toilet at this time. Blood pressure slightly improved although still on low-dose pressors. Heart rate of atrial fibrillation is controlled with no current additional medication management necessary. Currently there is no evidence of chest discomfort congestive heart failure or acute coronary syndrome based on issues. Patient has tolerated heparin for further risk reduction in stroke with continued atrial fibrillation  Review of Systems: Positive for: Shortness of breath Negative for: Vision change, hearing change, syncope, dizziness, nausea, vomiting,diarrhea, bloody stool, stomach pain, cough, congestion, diaphoresis, urinary frequency, urinary pain,skin lesions, skin rashes Others previously listed  Objective: Telemetry: Atrial fibrillation with controlled ventricular rate Physical Exam: Blood pressure 127/72, pulse (!) 58, temperature 98.4 F (36.9 C), temperature source Oral, resp. rate 13, height 5\' 6"  (1.676 m), weight 89.8 kg, SpO2 (!) 85 %. Body mass index is 31.95 kg/m. General: Well developed, well nourished, in no acute distress. Head: Normocephalic, atraumatic, sclera non-icteric, no xanthomas, nares are without discharge. Neck: No apparent masses Lungs: Normal respirations with few wheezes, some rhonchi, no rales , no crackles   Heart: Irregular rate and rhythm, normal S1 S2, no murmur, no rub, no gallop, PMI is normal size and placement, carotid upstroke normal without bruit, jugular venous pressure normal Abdomen: Soft, non-tender, non-distended with normoactive bowel sounds. No hepatosplenomegaly. Abdominal aorta is normal size without bruit Extremities: Trace to 1+ edema, no clubbing, no cyanosis, no ulcers,  Peripheral: 2+ radial, 2+  femoral, 2+ dorsal pedal pulses Neuro: Alert and oriented. Moves all extremities spontaneously. Psych:  Responds to questions appropriately with a normal affect.   Intake/Output Summary (Last 24 hours) at 03/29/2020 0857 Last data filed at 03/29/2020 0853 Gross per 24 hour  Intake 3948.76 ml  Output 1290 ml  Net 2658.76 ml    Inpatient Medications:  . atorvastatin  80 mg Oral q1800  . Chlorhexidine Gluconate Cloth  6 each Topical Daily  . digoxin  0.125 mg Intravenous Daily  . docusate  100 mg Oral BID  . fentaNYL (SUBLIMAZE) injection  25 mcg Intravenous Once  . hydrocortisone sod succinate (SOLU-CORTEF) inj  50 mg Intravenous Q6H  . insulin aspart  0-15 Units Subcutaneous Q4H  . mupirocin ointment  1 application Nasal BID  . polyethylene glycol  17 g Oral Daily  . sertraline  100 mg Oral Daily  . sodium chloride flush  10-40 mL Intracatheter Q12H   Infusions:  . sodium chloride Stopped (03/29/20 0814)  . albumin human Stopped (03/28/20 1041)  .  ceFAZolin (ANCEF) IV Stopped (03/29/20 0549)  . dexmedetomidine (PRECEDEX) IV infusion Stopped (03/29/20 0428)  . famotidine (PEPCID) IV Stopped (03/28/20 2144)  . fentaNYL infusion INTRAVENOUS Stopped (03/28/20 1357)  . lactated ringers Stopped (03/28/20 2001)  . methocarbamol (ROBAXIN) IV Stopped (03/27/20 1322)  . phenylephrine (NEO-SYNEPHRINE) Adult infusion    . potassium chloride 100 mL/hr at 03/29/20 0853  . vasopressin 0.03 Units/min (03/29/20 0853)    Labs: Recent Labs    03/28/20 0040 03/28/20 0040 03/28/20 1249 03/29/20 0519  NA 137   < > 138 137  K 4.5   < > 4.3 3.6  CL 111   < > 106 106  CO2 23  --   --  24  GLUCOSE 222*   < > 160* 145*  BUN 25*   < >  27* 35*  CREATININE 0.81   < > 0.80 0.99  CALCIUM 7.3*  --   --  7.4*  MG 1.5*  --   --  2.0   < > = values in this interval not displayed.   Recent Labs    03/27/20 2122  AST 30  ALT 14  ALKPHOS 34*  BILITOT 1.4*  PROT 4.0*  ALBUMIN 2.3*   Recent  Labs    03/26/20 1506 03/27/20 0509 03/27/20 2122 03/28/20 0040 03/28/20 2300 03/29/20 0519  WBC 8.9   < > 17.6*   < > 18.5* 18.2*  NEUTROABS 6.7  --  15.6*  --   --   --   HGB 12.6   < > 8.5*   < > 7.0* 7.9*  HCT 36.2   < > 24.6*   < > 20.4* 22.6*  MCV 87.7   < > 91.4   < > 90.3 90.0  PLT 148*   < > 85*   < > 85* 85*   < > = values in this interval not displayed.   No results for input(s): CKTOTAL, CKMB, TROPONINI in the last 72 hours. Invalid input(s): POCBNP No results for input(s): HGBA1C in the last 72 hours.   Weights: Filed Weights   03/26/20 1440 03/27/20 1428  Weight: 89.8 kg 89.8 kg     Radiology/Studies:  DG Chest 1 View  Result Date: 03/26/2020 CLINICAL DATA:  86 year old female with right femoral fracture. EXAM: CHEST  1 VIEW COMPARISON:  Chest radiograph dated 12/20/2017. FINDINGS: No focal consolidation, pleural effusion, or pneumothorax. Mild chronic interstitial coarsening. There is stable cardiomegaly. Osteopenia. No acute osseous pathology. IMPRESSION: No acute cardiopulmonary process. Electronically Signed   By: Elgie Collard M.D.   On: 03/26/2020 16:28   DG Pelvis 1-2 Views  Result Date: 03/26/2020 CLINICAL DATA:  86 year old female with fall and right hip pain. EXAM: RIGHT FEMUR 2 VIEWS; PELVIS - 1-2 VIEW COMPARISON:  CT abdomen pelvis dated 01/19/2018. FINDINGS: There is a displaced oblique or spiral fracture of the proximal right femur extending from the femoral neck into the proximal femoral diaphysis. There is full shaft width medial displacement of the distal fracture fragment. There is no dislocation. The bones are osteopenic. Vascular calcification as well as calcified fibroid over the left pelvis. The soft tissues are grossly unremarkable. IMPRESSION: Displaced oblique or spiral fracture of the proximal right femur. Electronically Signed   By: Elgie Collard M.D.   On: 03/26/2020 16:28   DG Chest Port 1 View  Result Date: 03/28/2020 CLINICAL DATA:   Acute respiratory failure. EXAM: PORTABLE CHEST 1 VIEW COMPARISON:  03/27/2020. FINDINGS: Endotracheal tube, right IJ line in stable position. Cardiomegaly. No pulmonary venous congestion. Low lung volumes with mild bibasilar atelectasis/infiltrates. Tiny left pleural effusion cannot be excluded. No pneumothorax. IMPRESSION: 1.  Lines and tubes in stable position. 2.  Stable cardiomegaly.  No pulmonary venous congestion. 3. Low lung volumes with mild bibasilar atelectasis/infiltrates. Small left pleural effusion cannot be excluded. Similar findings noted on prior exam. Electronically Signed   By: Maisie Fus  Register   On: 03/28/2020 05:23   Portable Chest x-ray  Result Date: 03/27/2020 CLINICAL DATA:  Post intubation. EXAM: PORTABLE CHEST 1 VIEW COMPARISON:  Radiograph yesterday. FINDINGS: Endotracheal tube tip 5.6 cm in the carina. Right internal jugular central venous catheter tip projects over the upper SVC. Similar cardiomegaly. No pneumothorax. Low lung volumes. Minimal blunting of the costophrenic angles may be due to small effusions. Chronic interstitial  coarsening. No confluent airspace disease. IMPRESSION: 1. Endotracheal tube tip 5.6 cm in the carina. 2. Right internal jugular central venous catheter tip projects over the upper SVC. No pneumothorax. 3. Low lung volumes. Similar cardiomegaly. Possible small pleural effusions. Electronically Signed   By: Narda RutherfordMelanie  Sanford M.D.   On: 03/27/2020 21:16   ECHOCARDIOGRAM COMPLETE  Result Date: 03/28/2020    ECHOCARDIOGRAM REPORT   Patient Name:   Charlott RakesKATHERINE L Baumgarner Date of Exam: 03/28/2020 Medical Rec #:  [id number]        Height:       66.0 in Accession #:    [id number]       Weight:       198.0 lb Date of Birth:  [birth year]        BSA:          1.991 m Patient Age:    86 years         BP:           99/76 mmHg Patient Gender: F                HR:           94 bpm. Exam Location:  [facility] Procedure: 2D Echo, Color Doppler and Cardiac Doppler Indications:     I48.91  Atrial fibrillation  History:         Patient has prior history of Echocardiogram examinations.                  Previous Myocardial Infarction and CAD; Risk Factors:Sleep                  Apnea and Hypertension. Recurrent pulmonary emboli.  Sonographer:     Humphrey RollsJoan Heiss RDCS (AE) Referring Phys:  [id number] Judithe ModestJEREMIAH D KEENE Diagnosing Phys: Arnoldo HookerBruce Jerolene Kupfer MD  Sonographer Comments: Echo performed with patient supine and on artificial respirator and Technically difficult study due to poor echo windows. Image acquisition challenging due to patient body habitus. IMPRESSIONS  1. Left ventricular ejection fraction, by estimation, is 50 to 55%. The left ventricle has low normal function. The left ventricle has no regional wall motion abnormalities. Left ventricular diastolic function could not be evaluated.  2. Right ventricular systolic function is normal. The right ventricular size is normal. There is mildly elevated pulmonary artery systolic pressure.  3. Left atrial size was moderately dilated.  4. The mitral valve is normal in structure. Mild to moderate mitral valve regurgitation.  5. The aortic valve is normal in structure. Aortic valve regurgitation is trivial. FINDINGS  Left Ventricle: Left ventricular ejection fraction, by estimation, is 50 to 55%. The left ventricle has low normal function. The left ventricle has no regional wall motion abnormalities. The left ventricular internal cavity size was normal in size. There is no left ventricular hypertrophy. Left ventricular diastolic function could not be evaluated. Right Ventricle: The right ventricular size is normal. No increase in right ventricular wall thickness. Right ventricular systolic function is normal. There is mildly elevated pulmonary artery systolic pressure. The tricuspid regurgitant velocity is 2.55  m/s, and with an assumed right atrial pressure of 10 mmHg, the estimated right ventricular systolic pressure is 36.0 mmHg. Left Atrium: Left atrial size  was moderately dilated. Right Atrium: Right atrial size was normal in size. Pericardium: There is no evidence of pericardial effusion. Mitral Valve: The mitral valve is normal in structure. Mild to moderate mitral valve regurgitation. MV peak gradient, 2.9 mmHg. The mean mitral valve gradient is 2.0  mmHg. Tricuspid Valve: The tricuspid valve is normal in structure. Tricuspid valve regurgitation is mild. Aortic Valve: The aortic valve is normal in structure. Aortic valve regurgitation is trivial. Aortic valve mean gradient measures 7.0 mmHg. Aortic valve peak gradient measures 13.2 mmHg. Aortic valve area, by VTI measures 1.12 cm. Pulmonic Valve: The pulmonic valve was normal in structure. Pulmonic valve regurgitation is trivial. Aorta: The aortic root and ascending aorta are structurally normal, with no evidence of dilitation. IAS/Shunts: No atrial level shunt detected by color flow Doppler.  LEFT VENTRICLE PLAX 2D LVIDd:         3.43 cm  Diastology LVIDs:         2.80 cm  LV e' lateral:   8.59 cm/s LV PW:         0.97 cm  LV E/e' lateral: 9.5 LV IVS:        1.05 cm LVOT diam:     2.20 cm LV SV:         31 LV SV Index:   15 LVOT Area:     3.80 cm  RIGHT VENTRICLE RV Basal diam:  3.85 cm LEFT ATRIUM              Index       RIGHT ATRIUM           Index LA diam:        4.20 cm  2.11 cm/m  RA Area:     14.70 cm LA Vol (A2C):   44.0 ml  22.10 ml/m RA Volume:   34.40 ml  17.28 ml/m LA Vol (A4C):   103.0 ml 51.73 ml/m LA Biplane Vol: 70.8 ml  35.56 ml/m  AORTIC VALVE                    PULMONIC VALVE AV Area (Vmax):    1.17 cm     PV Vmax:       0.97 m/s AV Area (Vmean):   1.09 cm     PV Vmean:      68.400 cm/s AV Area (VTI):     1.12 cm     PV VTI:        0.150 m AV Vmax:           182.00 cm/s  PV Peak grad:  3.7 mmHg AV Vmean:          126.000 cm/s PV Mean grad:  2.0 mmHg AV VTI:            0.275 m AV Peak Grad:      13.2 mmHg AV Mean Grad:      7.0 mmHg LVOT Vmax:         56.10 cm/s LVOT Vmean:        36.200  cm/s LVOT VTI:          0.081 m LVOT/AV VTI ratio: 0.29  AORTA Ao Root diam: 3.40 cm MITRAL VALVE               TRICUSPID VALVE MV Area (PHT): 3.28 cm    TR Peak grad:   26.0 mmHg MV Peak grad:  2.9 mmHg    TR Vmax:        255.00 cm/s MV Mean grad:  2.0 mmHg MV Vmax:       0.85 m/s    SHUNTS MV Vmean:      62.2 cm/s   Systemic VTI:  0.08 m MV Decel Time: 231 msec  Systemic Diam: 2.20 cm MV E velocity: 81.45 cm/s Arnoldo Hooker MD Electronically signed by Arnoldo Hooker MD Signature Date/Time: 03/28/2020/7:02:42 PM    Final    DG HIP OPERATIVE UNILAT W OR W/O PELVIS RIGHT  Result Date: 03/27/2020 CLINICAL DATA:  86 year old female with right femoral ORIF. EXAM: OPERATIVE right HIP (WITH PELVIS IF PERFORMED) 2 VIEWS TECHNIQUE: Fluoroscopic spot image(s) were submitted for interpretation post-operatively. COMPARISON:  None. FINDINGS: Eight intraoperative fluoroscopic spot images provided. The total fluoro time is 2 minutes 7 seconds and total air kerma of 20.6 mGy. Right femoral nail noted which appears intact. IMPRESSION: Status post right femoral nail. Electronically Signed   By: Elgie Collard M.D.   On: 03/27/2020 18:28   DG FEMUR, MIN 2 VIEWS RIGHT  Result Date: 03/27/2020 CLINICAL DATA:  Postop femur fracture fixation. EXAM: RIGHT FEMUR 2 VIEWS COMPARISON:  Radiograph yesterday. FINDINGS: Intramedullary nail with trans trochanteric and distal locking screw with proximal cerclage wire fixation of proximal femur fracture. Fracture is in improved alignment compared to preoperative imaging. Recent postsurgical change includes air and edema in the soft tissues as well as skin staples. There are vascular calcifications. IMPRESSION: Post ORIF mid-proximal femur fracture without immediate postoperative complication. Electronically Signed   By: Narda Rutherford M.D.   On: 03/27/2020 21:17   DG Femur Min 2 Views Right  Result Date: 03/26/2020 CLINICAL DATA:  86 year old female with fall and right hip pain.  EXAM: RIGHT FEMUR 2 VIEWS; PELVIS - 1-2 VIEW COMPARISON:  CT abdomen pelvis dated 01/19/2018. FINDINGS: There is a displaced oblique or spiral fracture of the proximal right femur extending from the femoral neck into the proximal femoral diaphysis. There is full shaft width medial displacement of the distal fracture fragment. There is no dislocation. The bones are osteopenic. Vascular calcification as well as calcified fibroid over the left pelvis. The soft tissues are grossly unremarkable. IMPRESSION: Displaced oblique or spiral fracture of the proximal right femur. Electronically Signed   By: Elgie Collard M.D.   On: 03/26/2020 16:28     Assessment and Recommendation  86 y.o. female with known paroxysmal nonvalvular atrial fibrillation with controlled ventricular rate coronary atherosclerosis status post previous stents hypertension hyperlipidemia with fall and right leg fracture now status post orthopedic surgery hypotension and respiratory difficulty slowly improving without evidence of congestive heart failure or myocardial infarction 1. Continue supportive care status post surgery from pulmonary toilet ambulation and rehabilitation without restriction 2. No additional medication management necessary at this time for heart rate control of atrial fibrillation 3. No further cardiac diagnostics necessary at this time 4. Okay for reinstatement of anticoagulation for further risk reduction in stroke with atrial fibrillation soon as orthopedic surgery is okay at 5 mg of Eliquis twice per day  Signed, Arnoldo Hooker M.D. FACC

## 2020-03-29 NOTE — Consult Note (Signed)
PHARMACY CONSULT NOTE  Pharmacy Consult for Electrolyte Monitoring and Replacement   Recent Labs: Potassium (mmol/L)  Date Value  03/29/2020 3.6   Magnesium (mg/dL)  Date Value  03/83/3383 2.0   Calcium (mg/dL)  Date Value  29/19/1660 7.4 (L)   Albumin (g/dL)  Date Value  60/12/5995 2.3 (L)  09/28/2017 3.9   Sodium (mmol/L)  Date Value  03/29/2020 137  09/28/2017 143   Corrected Ca: 8.76 mg/dL  Assessment: 86 y.o. Female w/ PMH CAD, NSTEMI s/p stents, HTN admitted 03/26/20 with Right displaced Femoral Shaft Fracture, s/p Intramedullary Fixation of the right femoral artery shaft fracture on 03/27/20.  Goal of Therapy:  Potassium 4.0 - 5.1 mmol/L Magnesium 2.0 - 2.4 mg/dL All Other Electrolytes WNL  Plan:   10 mEq IV KCl x 2  F/U electrolytes in am 7/11  Lowella Bandy ,PharmD Clinical Pharmacist 03/29/2020 7:07 AM

## 2020-03-29 NOTE — Progress Notes (Signed)
CRITICAL CARE PROGRESS NOTE    Name: Alison Russo MRN: [id number] DOB: Aug 02, 1926     LOS: 3   SUBJECTIVE FINDINGS & SIGNIFICANT EVENTS    Patient description:  86 y.o. Female admitted 03/26/20 with Right displaced Femoral Shaft Fracture.  She underwent Intramedullary Fixation of the right femoral artery shaft fracture on 03/27/20. Intraoperatively she was noted to have persistent venous oozing from the vastus lateralis and fracture site, with approximately 1200 cc of blood loss.  She was given 2 units pRBC's intraoperatively. She returns to ICU post procedure and remains intubated.  Upon arrival to ICU noted to have severe shock and atrial fibrillation with RVR.  SIGNIFICANT EVENTS  7/7: Admission to [place] 7/8: Underwent Intramedullary Fixation of fracture  7/8: Returns to ICU post procedure and remains intubated; with severe shock and A-fib w/ RVR 7/8: Right IJ CVC placed 7/9- met with daughter at bedside and reivewed hospital course and care plan.  03/29/20- patient had blood transfusion overnight, she has improved clinically, able to speak appropriately. Will work on optimizing for [place]  Lines/tubes : Airway 7 mm (Active)  Secured at (cm) 21 cm 03/28/20 1200  Measured From Lips 03/28/20 [place] 03/28/20 1200  Secured By [employer] 03/28/20 1121  Tube Holder Repositioned Yes 03/28/20 1200  Cuff Pressure (cm H2O) 26 cm H2O 03/28/20 0815  Site Condition Dry 03/28/20 1200     CVC Triple Lumen 03/27/20 Right Internal jugular (Active)  Indication for Insertion or Continuance of Line Vasoactive infusions 03/28/20 1200  Site Assessment Clean;Intact;Dry 03/28/20 1200  Proximal Lumen Status Infusing 03/28/20 1200  Medial Lumen Status Infusing 03/28/20 1200  Distal Lumen  Status Infusing 03/28/20 1200  Dressing Type Transparent 03/28/20 1200  Dressing Status Clean;Dry;Intact;Antimicrobial disc in place 03/28/20 1200  Line Care Connections checked and tightened 03/28/20 1200  Dressing Change Due 04/02/20 03/28/20 0800     Arterial Line 03/28/20 Left Radial (Active)  Site Assessment Clean;Dry;Intact 03/28/20 1200  Line Status Pulsatile blood flow 03/28/20 1200  Art Line Waveform Appropriate;Square wave test performed 03/28/20 1200  Art Line Interventions Zeroed and calibrated;Leveled;Connections checked and tightened;Flushed per protocol 03/28/20 1200  Color/Movement/Sensation Capillary refill less than 3 sec 03/28/20 1200  Dressing Type Transparent 03/28/20 1200  Dressing Status Clean;Dry;Intact;Antimicrobial disc in place 03/28/20 1200  Dressing Change Due 04/03/20 03/28/20 0800     Urethral Catheter b kelly Double-lumen;Non-latex 16 Fr. (Active)  Indication for Insertion or Continuance of Catheter Peri-operative use for selective surgical procedure - not to exceed 24 hours post-op 03/28/20 0738  Site Assessment Clean;Intact;Dry 03/28/20 0758  Catheter Maintenance Bag below level of bladder;Drainage bag/tubing not touching floor;No dependent loops;Seal intact;Catheter secured;Insertion date on drainage bag;Bag emptied prior to transport 03/28/20 0758  Collection Container Standard drainage bag 03/28/20 0758  Securement Method Securing device (Describe) 03/28/20 0758  Urinary Catheter Interventions (if applicable) Unclamped 14/78/29 0758  Output (mL) 40 mL 03/28/20 1300    Microbiology/Sepsis markers: Results for orders placed or performed during the hospital encounter of 03/26/20  SARS Coronavirus 2 by RT PCR (hospital order, performed in [facility] hospital lab) Nasopharyngeal Nasopharyngeal Swab     Status: None   Collection Time: 03/26/20  5:00 PM   Specimen: Nasopharyngeal Swab  Result Value Ref Range Status   SARS Coronavirus 2 NEGATIVE NEGATIVE  Final    Comment: (NOTE) SARS-CoV-2 target nucleic acids are NOT DETECTED.  The SARS-CoV-2 RNA is generally detectable in upper and lower respiratory specimens during  the acute phase of infection. The lowest concentration of SARS-CoV-2 viral copies this assay can detect is 250 copies / mL. A negative result does not preclude SARS-CoV-2 infection and should not be used as the sole basis for treatment or other patient management decisions.  A negative result may occur with improper specimen collection / handling, submission of specimen other than nasopharyngeal swab, presence of viral mutation(s) within the areas targeted by this assay, and inadequate number of viral copies (<250 copies / mL). A negative result must be combined with clinical observations, patient history, and epidemiological information.  Fact Sheet for Patients:   StrictlyIdeas.no  Fact Sheet for Healthcare Providers: BankingDealers.co.za  This test is not yet approved or  cleared by the [place] FDA and has been authorized for detection and/or diagnosis of SARS-CoV-2 by FDA under an Emergency Use Authorization (EUA).  This EUA will remain in effect (meaning this test can be used) for the duration of the COVID-19 declaration under Section 564(b)(1) of the Act, 21 U.S.C. section 360bbb-3(b)(1), unless the authorization is terminated or revoked sooner.  Performed at [facility], [address] [place], [place] [address]   Surgical pcr screen     Status: Abnormal   Collection Time: 03/27/20  4:42 AM  Result Value Ref Range Status   MRSA, PCR NEGATIVE NEGATIVE Final   Staphylococcus aureus POSITIVE (A) NEGATIVE Final    Comment: (NOTE) The Xpert SA Assay (FDA approved for NASAL specimens in patients 12 years of age and older), is one component of a comprehensive surveillance program. It is not intended to diagnose infection nor to guide or monitor  treatment. Performed at [facility], [address] [place], [place] [address]   CULTURE, BLOOD (ROUTINE X 2) w Reflex to ID Panel     Status: None (Preliminary result)   Collection Time: 03/27/20  8:57 PM   Specimen: BLOOD LEFT HAND  Result Value Ref Range Status   Specimen Description BLOOD LEFT HAND  Final   Special Requests   Final    BOTTLES DRAWN AEROBIC ONLY Blood Culture results may not be optimal due to an inadequate volume of blood received in culture bottles   Culture   Final    NO GROWTH 2 DAYS Performed at [facility], [address] [place], [place] [address]    Report Status PENDING  Incomplete  CULTURE, BLOOD (ROUTINE X 2) w Reflex to ID Panel     Status: None (Preliminary result)   Collection Time: 03/27/20  9:02 PM   Specimen: BLOOD RIGHT HAND  Result Value Ref Range Status   Specimen Description BLOOD RIGHT HAND  Final   Special Requests   Final    BOTTLES DRAWN AEROBIC ONLY Blood Culture adequate volume   Culture   Final    NO GROWTH 2 DAYS Performed at [facility], [address], [place], [place] [address]    Report Status PENDING  Incomplete  Urine Culture     Status: Abnormal (Preliminary result)   Collection Time: 03/27/20 11:59 PM   Specimen: Urine, Clean Catch  Result Value Ref Range Status   Specimen Description   Final    URINE, CLEAN CATCH Performed at [facility], [address], [place], [place] [address]    Special Requests   Final    NONE Performed at [facility], [address], [place], [place] [address]    Culture (A)  Final    50,000 COLONIES/mL GRAM NEGATIVE  RODS SUSCEPTIBILITIES TO FOLLOW Performed at [facility], [place] [address], [place], [place] [address]    Report Status PENDING  Incomplete    Anti-infectives:  Anti-infectives (From admission, onward)   Start     Dose/Rate Route Frequency Ordered Stop   03/27/20 2330  ceFAZolin (ANCEF) IVPB 1 g/50 mL premix      Discontinue     1 g 100 mL/hr over 30 Minutes Intravenous Every 8 hours 03/27/20 2315     03/27/20 1843  gentamicin 80 mg in 0.9% sodium chloride 250 mL irrigation  Status:  Discontinued          As needed 03/27/20 1843 03/27/20 1848   03/27/20 1423  ceFAZolin (ANCEF) 2-4 GM/100ML-% IVPB       Note to Pharmacy: Milinda Cave   : cabinet override      03/27/20 1423 03/27/20 1533   03/27/20 0600  ceFAZolin (ANCEF) IVPB 2g/100 mL premix        2 g 200 mL/hr over 30 Minutes Intravenous 30 min pre-op 03/26/20 2142 03/27/20 1528       Consults: Treatment Team:  Thornton Park, MD Corey Skains, MD     Tests / Events: TTE 03/28/20    PAST MEDICAL HISTORY   Past Medical History:  Diagnosis Date   Anginal pain (HCC)    CAD (coronary artery disease)    Heart murmur    Hypertension    Mood disorder ([place])    mostly anxiety   NSTEMI (non-ST elevated myocardial infarction) ([place]) 12/22/2017   OSA on CPAP    PE (pulmonary embolism)    recurrent     SURGICAL HISTORY   Past Surgical History:  Procedure Laterality Date   BREAST CYST ASPIRATION Right    neg   CARDIAC CATHETERIZATION     CATARACT EXTRACTION, BILATERAL     COLONOSCOPY     CORONARY STENT INTERVENTION N/A 12/23/2017   Procedure: CORONARY STENT INTERVENTION;  Surgeon: Martinique, Peter M, MD;  Location: [place] [facility];  Service: Cardiovascular;  Laterality: N/A;   EYE SURGERY Left    "blood vessel busted behind my eye"   EYE SURGERY Right    ?vitrectomy   HYSTEROSCOPY WITH D & C N/A 10/25/2016   Procedure: DILATATION AND CURETTAGE /HYSTEROSCOPY, POLYPECTOMY;  Surgeon: Benjaman Kindler, MD;  Location: [facility] ORS;  Service: Gynecology;  Laterality: N/A;   INGUINAL HERNIA REPAIR Right 1950s   INTRAMEDULLARY (IM) NAIL INTERTROCHANTERIC Right 03/27/2020   Procedure: INTRAMEDULLARY (IM) NAIL INTERTROCHANTRIC;  Surgeon: Thornton Park, MD;  Location: [facility] ORS;  Service: Orthopedics;  Laterality: Right;    LEFT HEART CATH AND CORONARY ANGIOGRAPHY N/A 12/21/2017   Procedure: LEFT HEART CATH AND CORONARY ANGIOGRAPHY possible PCI and stent;  Surgeon: Yolonda Kida, MD;  Location: [place] [facility];  Service: Cardiovascular;  Laterality: N/A;   LOWER EXTREMITY ANGIOGRAPHY Right 01/11/2018   Procedure: LOWER EXTREMITY ANGIOGRAPHY;  Surgeon: Algernon Huxley, MD;  Location: [place] [facility];  Service: Cardiovascular;  Laterality: Right;   PSEUDOANERYSM COMPRESSION Left 01/20/2018   Procedure: PSEUDOANERYSM COMPRESSION;  Surgeon: Katha Cabal, MD;  Location: [place] [facility];  Service: Cardiovascular;  Laterality: Left;   TONSILLECTOMY       FAMILY HISTORY   Family History  Problem Relation Age of Onset   Breast cancer Mother 37   AAA (abdominal aortic aneurysm) Father        ruptured   Coronary artery disease Brother  SOCIAL HISTORY   Social History   Tobacco Use   Smoking status: Never Smoker   Smokeless tobacco: Never Used  Vaping Use   Vaping Use: Never used  Substance Use Topics   Alcohol use: Never   Drug use: Never     MEDICATIONS   Current Medication:  Current Facility-Administered Medications:    0.9 %  sodium chloride infusion, , Intravenous, PRN, Ottie Glazier, MD, Paused at 03/29/20 0814   albumin human 25 % solution 12.5 g, 12.5 g, Intravenous, Daily, Ottie Glazier, MD, Last Rate: 60 mL/hr at 03/29/20 1039, 12.5 g at 03/29/20 1039   alum & mag hydroxide-simeth (MAALOX/MYLANTA) 200-200-20 MG/5ML suspension 15 mL, 15 mL, Oral, Q6H PRN, Enzo Bi, MD   atorvastatin (LIPITOR) tablet 80 mg, 80 mg, Oral, q1800, Enzo Bi, MD   calcium carbonate (TUMS - dosed in mg elemental calcium) chewable tablet 200 mg of elemental calcium, 1 tablet, Oral, TID PRN, Enzo Bi, MD   ceFAZolin (ANCEF) IVPB 1 g/50 mL premix, 1 g, Intravenous, Q8H, Darel Hong D, NP, Stopped at 03/29/20 0549   Chlorhexidine Gluconate Cloth 2 % PADS 6 each, 6  each, Topical, Daily, Darel Hong D, NP, 6 each at 03/28/20 1726   dexmedetomidine (PRECEDEX) 400 MCG/100ML (4 mcg/mL) infusion, 0.4-1.2 mcg/kg/hr, Intravenous, Titrated, Darel Hong D, NP, Stopped at 03/29/20 0428   digoxin (LANOXIN) 0.25 MG/ML injection 0.125 mg, 0.125 mg, Intravenous, Daily, Lanney Gins, Jehieli Brassell, MD, 0.125 mg at 03/29/20 1021   docusate (COLACE) 50 MG/5ML liquid 100 mg, 100 mg, Oral, BID, Darel Hong D, NP, 100 mg at 03/29/20 1022   docusate sodium (COLACE) capsule 100 mg, 100 mg, Oral, BID PRN, Enzo Bi, MD   famotidine (PEPCID) IVPB 20 mg premix, 20 mg, Intravenous, Q12H, Darel Hong D, NP, Last Rate: 100 mL/hr at 03/29/20 1026, 20 mg at 03/29/20 1026   fentaNYL (SUBLIMAZE) bolus via infusion 25 mcg, 25 mcg, Intravenous, Q15 min PRN, Darel Hong D, NP   fentaNYL (SUBLIMAZE) injection 25 mcg, 25 mcg, Intravenous, Once, Darel Hong D, NP   fentaNYL 2510mg in NS 2545m(1019mml) infusion-PREMIX, 25-200 mcg/hr, Intravenous, Continuous, KeeDarel Hong NP, Stopped at 03/28/20 1357   guaiFENesin-dextromethorphan (ROBITUSSIN DM) 100-10 MG/5ML syrup 10 mL, 10 mL, Oral, Q6H PRN, LaiEnzo BiD   hydrocortisone sodium succinate (SOLU-CORTEF) 100 MG injection 50 mg, 50 mg, Intravenous, Q6H, KeeDarel Hong NP, 50 mg at 03/29/20 1020   insulin aspart (novoLOG) injection 0-15 Units, 0-15 Units, Subcutaneous, Q4H, KeeDarel Hong NP, 2 Units at 03/29/20 0811   ipratropium-albuterol (DUONEB) 0.5-2.5 (3) MG/3ML nebulizer solution 3 mL, 3 mL, Nebulization, Q4H PRN, KeeBradly BienenstockP   lactated ringers infusion, , Intravenous, Continuous, AdaMolli BarrowsD, Stopped at 03/28/20 2001   methocarbamol (ROBAXIN) 500 mg in dextrose 5 % 50 mL IVPB, 500 mg, Intravenous, Q8H PRN, KraThornton ParkD, Stopped at 03/27/20 1322   midazolam (VERSED) injection 1 mg, 1 mg, Intravenous, Q15 min PRN, KeeDarel Hong NP   midazolam (VERSED) injection 1 mg, 1  mg, Intravenous, Q2H PRN, KeeDarel Hong NP, 1 mg at 03/27/20 2235   morphine 4 MG/ML injection 4 mg, 4 mg, Intravenous, Q3H PRN, LaiEnzo BiD, 4 mg at 03/29/20 0101   mupirocin ointment (BACTROBAN) 2 % 1 application, 1 application, Nasal, BID, AleOttie GlazierD, 1 application at 07/37/06/6226   ondansetron (ZOFRAN) injection 4 mg, 4 mg, Intravenous, Q6H PRN, LaiEnzo BiD   ondansetron (ZOFRAN-ODT) disintegrating tablet 4  mg, 4 mg, Oral, Q8H PRN, Enzo Bi, MD   phenylephrine CONCENTRATED 112m in sodium chloride 0.9% 2553m(0.67m75mL) infusion, 0-400 mcg/min, Intravenous, Titrated, Hall, Scott A, RPH   polyethylene glycol (MIRALAX / GLYCOLAX) packet 17 g, 17 g, Oral, BID PRN, LaiEnzo BiD   polyethylene glycol (MIRALAX / GLYCOLAX) packet 17 g, 17 g, Oral, Daily, KeeDarel Hong NP, 17 g at 03/29/20 1022   potassium chloride 10 mEq in 100 mL IVPB, 10 mEq, Intravenous, Q1 Hr x 2, GruDallie PilesPH, Last Rate: 100 mL/hr at 03/29/20 0853, Rate Verify at 03/29/20 0853   sertraline (ZOLOFT) tablet 100 mg, 100 mg, Oral, Daily, LaiEnzo BiD, 100 mg at 03/29/20 1023   sodium chloride flush (NS) 0.9 % injection 10-40 mL, 10-40 mL, Intracatheter, Q12H, KeeDarel Hong NP, 10 mL at 03/29/20 1027   sodium chloride flush (NS) 0.9 % injection 10-40 mL, 10-40 mL, Intracatheter, PRN, KeeBradly BienenstockP   traZODone (DESYREL) tablet 25 mg, 25 mg, Oral, QHS PRN, LaiEnzo BiD   vasopressin (PITRESSIN) 20 Units in sodium chloride 0.9 % 100 mL infusion-*FOR SHOCK*, 0-0.03 Units/min, Intravenous, Continuous, KeeDarel Hong NP, Last Rate: 9 mL/hr at 03/29/20 0853, 0.03 Units/min at 03/29/20 0853    ALLERGIES   Patient has no known allergies.    REVIEW OF SYSTEMS    Unable to obtain due to MV  PHYSICAL EXAMINATION   Vital Signs: Temp:  [97.9 F (36.6 C)-99 F (37.2 C)] 98.4 F (36.9 C) (07/10 0800) Pulse Rate:  [58-103] 80 (07/10 1000) Resp:  [13-25] 22 (07/10  1000) BP: (82-144)/(50-93) 107/62 (07/10 1000) SpO2:  [80 %-100 %] 99 % (07/10 1000) Arterial Line BP: (81-121)/(61-92) 81/72 (07/09 1600) FiO2 (%):  [28 %] 28 % (07/09 1200)  GENERAL:Sedated on MV , age approprate HEAD: Normocephalic, atraumatic.  EYES: Pupils equal, round, reactive to light.  No scleral icterus.  MOUTH: Moist mucosal membrane. NECK: Supple. No thyromegaly. No nodules. No JVD.  PULMONARY: mild rhonchi CARDIOVASCULAR: S1 and S2. Regular rate and rhythm. No murmurs, rubs, or gallops.  GASTROINTESTINAL: Soft, nontender, non-distended. No masses. Positive bowel sounds. No hepatosplenomegaly.  MUSCULOSKELETAL: No swelling, clubbing, or edema.  NEUROLOGIC: GCS4T SKIN:intact,warm,dry   PERTINENT DATA     Infusions:  sodium chloride Stopped (03/29/20 0814)   albumin human 12.5 g (03/29/20 1039)    ceFAZolin (ANCEF) IV Stopped (03/29/20 0549)   dexmedetomidine (PRECEDEX) IV infusion Stopped (03/29/20 0428)   famotidine (PEPCID) IV 20 mg (03/29/20 1026)   fentaNYL infusion INTRAVENOUS Stopped (03/28/20 1357)   lactated ringers Stopped (03/28/20 2001)   methocarbamol (ROBAXIN) IV Stopped (03/27/20 1322)   phenylephrine (NEO-SYNEPHRINE) Adult infusion     potassium chloride 100 mL/hr at 03/29/20 0853   vasopressin 0.03 Units/min (03/29/20 0853)   Scheduled Medications:  atorvastatin  80 mg Oral q1800   Chlorhexidine Gluconate Cloth  6 each Topical Daily   digoxin  0.125 mg Intravenous Daily   docusate  100 mg Oral BID   fentaNYL (SUBLIMAZE) injection  25 mcg Intravenous Once   hydrocortisone sod succinate (SOLU-CORTEF) inj  50 mg Intravenous Q6H   insulin aspart  0-15 Units Subcutaneous Q4H   mupirocin ointment  1 application Nasal BID   polyethylene glycol  17 g Oral Daily   sertraline  100 mg Oral Daily   sodium chloride flush  10-40 mL Intracatheter Q12H   PRN Medications: sodium chloride, alum & mag hydroxide-simeth, calcium carbonate,  docusate sodium, fentaNYL, guaiFENesin-dextromethorphan,  ipratropium-albuterol, methocarbamol (ROBAXIN) IV, midazolam, midazolam, morphine injection, ondansetron (ZOFRAN) IV, ondansetron, polyethylene glycol, sodium chloride flush, traZODone Hemodynamic parameters: CVP:  [3 mmHg-11 mmHg] 11 mmHg Intake/Output: 07/09 0701 - 07/10 0700 In: 3487.9 [I.V.:1509.8; Blood:678; IV Piggyback:1300.1] Out: 1265 [Urine:1265]  Ventilator  Settings: Vent Mode: PSV FiO2 (%):  [28 %] 28 % Set Rate:  [15 bmp] 15 bmp Vt Set:  [450 mL] 450 mL PEEP:  [5 cmH20] 5 cmH20 Pressure Support:  [5 cmH20] 5 cmH20   LAB RESULTS:  Basic Metabolic Panel: Recent Labs  Lab 03/26/20 1506 03/26/20 1506 03/27/20 0509 03/27/20 0509 03/27/20 2122 03/27/20 2122 03/28/20 0040 03/28/20 0040 03/28/20 1249 03/29/20 0519  NA 142   < > 139  --  138  --  137  --  138 137  K 3.7   < > 4.4   < > 4.5   < > 4.5   < > 4.3 3.6  CL 110   < > 108  --  108  --  111  --  106 106  CO2 24  --  23  --  26  --  23  --   --  24  GLUCOSE 166*   < > 139*  --  189*  --  222*  --  160* 145*  BUN 17   < > 18  --  24*  --  25*  --  27* 35*  CREATININE 0.75   < > 0.76  --  0.82  --  0.81  --  0.80 0.99  CALCIUM 8.9  --  8.8*  --  7.5*  --  7.3*  --   --  7.4*  MG  --   --  1.9  --   --   --  1.5*  --   --  2.0   < > = values in this interval not displayed.   Liver Function Tests: Recent Labs  Lab 03/27/20 2122  AST 30  ALT 14  ALKPHOS 34*  BILITOT 1.4*  PROT 4.0*  ALBUMIN 2.3*   No results for input(s): LIPASE, AMYLASE in the last 168 hours. No results for input(s): AMMONIA in the last 168 hours. CBC: Recent Labs  Lab 03/26/20 1506 03/26/20 1506 03/27/20 0509 03/27/20 1650 03/27/20 2122 03/27/20 2122 03/28/20 0040 03/28/20 0040 03/28/20 0630 03/28/20 1249 03/28/20 1450 03/28/20 2300 03/29/20 0519  WBC 8.9   < > 13.5*  --  17.6*  --  21.0*  --   --   --   --  18.5* 18.2*  NEUTROABS 6.7  --   --   --  15.6*  --   --    --   --   --   --   --   --   HGB 12.6   < > 10.7*   < > 8.5*   < > 7.9*   < > 10.0* 8.2* 7.9* 7.0* 7.9*  HCT 36.2   < > 32.4*  --  24.6*   < > 22.8*   < > 27.8* 24.0* 22.5* 20.4* 22.6*  MCV 87.7   < > 91.5  --  91.4  --  90.8  --   --   --   --  90.3 90.0  PLT 148*   < > 137*  --  85*  --  87*  --   --   --   --  85* 85*   < > = values in this interval not displayed.  Cardiac Enzymes: No results for input(s): CKTOTAL, CKMB, CKMBINDEX, TROPONINI in the last 168 hours. BNP: Invalid input(s): POCBNP CBG: Recent Labs  Lab 03/28/20 1616 03/28/20 1953 03/28/20 2309 03/29/20 0340 03/29/20 0713  GLUCAP 137* 115* 145* 146* 128*       IMAGING RESULTS:  Imaging: DG Chest Port 1 View  Result Date: 03/28/2020 CLINICAL DATA:  Acute respiratory failure. EXAM: PORTABLE CHEST 1 VIEW COMPARISON:  03/27/2020. FINDINGS: Endotracheal tube, right IJ line in stable position. Cardiomegaly. No pulmonary venous congestion. Low lung volumes with mild bibasilar atelectasis/infiltrates. Tiny left pleural effusion cannot be excluded. No pneumothorax. IMPRESSION: 1.  Lines and tubes in stable position. 2.  Stable cardiomegaly.  No pulmonary venous congestion. 3. Low lung volumes with mild bibasilar atelectasis/infiltrates. Small left pleural effusion cannot be excluded. Similar findings noted on prior exam. Electronically Signed   By: Marcello Moores  Register   On: 03/28/2020 05:23   Portable Chest x-ray  Result Date: 03/27/2020 CLINICAL DATA:  Post intubation. EXAM: PORTABLE CHEST 1 VIEW COMPARISON:  Radiograph yesterday. FINDINGS: Endotracheal tube tip 5.6 cm in the carina. Right internal jugular central venous catheter tip projects over the upper SVC. Similar cardiomegaly. No pneumothorax. Low lung volumes. Minimal blunting of the costophrenic angles may be due to small effusions. Chronic interstitial coarsening. No confluent airspace disease. IMPRESSION: 1. Endotracheal tube tip 5.6 cm in the carina. 2. Right  internal jugular central venous catheter tip projects over the upper SVC. No pneumothorax. 3. Low lung volumes. Similar cardiomegaly. Possible small pleural effusions. Electronically Signed   By: Keith Rake M.D.   On: 03/27/2020 21:16   ECHOCARDIOGRAM COMPLETE  Result Date: 03/28/2020    ECHOCARDIOGRAM REPORT   Patient Name:   KABRIA HETZER Date of Exam: 03/28/2020 Medical Rec #:  [id number]        Height:       66.0 in Accession #:    [id number]       Weight:       198.0 lb Date of Birth:  [birth year]        BSA:          1.991 m Patient Age:    25 years         BP:           99/76 mmHg Patient Gender: F                HR:           94 bpm. Exam Location:  [facility] Procedure: 2D Echo, Color Doppler and Cardiac Doppler Indications:     I48.91 Atrial fibrillation  History:         Patient has prior history of Echocardiogram examinations.                  Previous Myocardial Infarction and CAD; Risk Factors:Sleep                  Apnea and Hypertension. Recurrent pulmonary emboli.  Sonographer:     Charmayne Sheer RDCS (AE) Referring Phys:  [id number] Bradly Bienenstock Diagnosing Phys: Serafina Royals MD  Sonographer Comments: Echo performed with patient supine and on artificial respirator and Technically difficult study due to poor echo windows. Image acquisition challenging due to patient body habitus. IMPRESSIONS  1. Left ventricular ejection fraction, by estimation, is 50 to 55%. The left ventricle has low normal function. The left ventricle has no regional wall motion abnormalities. Left ventricular diastolic function could not be evaluated.  2. Right ventricular systolic function is normal. The right ventricular size is normal. There is mildly elevated pulmonary artery systolic pressure.  3. Left atrial size was moderately dilated.  4. The mitral valve is normal in structure. Mild to moderate mitral valve regurgitation.  5. The aortic valve is normal in structure. Aortic valve regurgitation is trivial. FINDINGS  Left  Ventricle: Left ventricular ejection fraction, by estimation, is 50 to 55%. The left ventricle has low normal function. The left ventricle has no regional wall motion abnormalities. The left ventricular internal cavity size was normal in size. There is no left ventricular hypertrophy. Left ventricular diastolic function could not be evaluated. Right Ventricle: The right ventricular size is normal. No increase in right ventricular wall thickness. Right ventricular systolic function is normal. There is mildly elevated pulmonary artery systolic pressure. The tricuspid regurgitant velocity is 2.55  m/s, and with an assumed right atrial pressure of 10 mmHg, the estimated right ventricular systolic pressure is 95.2 mmHg. Left Atrium: Left atrial size was moderately dilated. Right Atrium: Right atrial size was normal in size. Pericardium: There is no evidence of pericardial effusion. Mitral Valve: The mitral valve is normal in structure. Mild to moderate mitral valve regurgitation. MV peak gradient, 2.9 mmHg. The mean mitral valve gradient is 2.0 mmHg. Tricuspid Valve: The tricuspid valve is normal in structure. Tricuspid valve regurgitation is mild. Aortic Valve: The aortic valve is normal in structure. Aortic valve regurgitation is trivial. Aortic valve mean gradient measures 7.0 mmHg. Aortic valve peak gradient measures 13.2 mmHg. Aortic valve area, by VTI measures 1.12 cm. Pulmonic Valve: The pulmonic valve was normal in structure. Pulmonic valve regurgitation is trivial. Aorta: The aortic root and ascending aorta are structurally normal, with no evidence of dilitation. IAS/Shunts: No atrial level shunt detected by color flow Doppler.  LEFT VENTRICLE PLAX 2D LVIDd:         3.43 cm  Diastology LVIDs:         2.80 cm  LV e' lateral:   8.59 cm/s LV PW:         0.97 cm  LV E/e' lateral: 9.5 LV IVS:        1.05 cm LVOT diam:     2.20 cm LV SV:         31 LV SV Index:   15 LVOT Area:     3.80 cm  RIGHT VENTRICLE RV Basal  diam:  3.85 cm LEFT ATRIUM              Index       RIGHT ATRIUM           Index LA diam:        4.20 cm  2.11 cm/m  RA Area:     14.70 cm LA Vol (A2C):   44.0 ml  22.10 ml/m RA Volume:   34.40 ml  17.28 ml/m LA Vol (A4C):   103.0 ml 51.73 ml/m LA Biplane Vol: 70.8 ml  35.56 ml/m  AORTIC VALVE                    PULMONIC VALVE AV Area (Vmax):    1.17 cm     PV Vmax:       0.97 m/s AV Area (Vmean):   1.09 cm     PV Vmean:      68.400 cm/s AV Area (VTI):     1.12 cm     PV VTI:  0.150 m AV Vmax:           182.00 cm/s  PV Peak grad:  3.7 mmHg AV Vmean:          126.000 cm/s PV Mean grad:  2.0 mmHg AV VTI:            0.275 m AV Peak Grad:      13.2 mmHg AV Mean Grad:      7.0 mmHg LVOT Vmax:         56.10 cm/s LVOT Vmean:        36.200 cm/s LVOT VTI:          0.081 m LVOT/AV VTI ratio: 0.29  AORTA Ao Root diam: 3.40 cm MITRAL VALVE               TRICUSPID VALVE MV Area (PHT): 3.28 cm    TR Peak grad:   26.0 mmHg MV Peak grad:  2.9 mmHg    TR Vmax:        255.00 cm/s MV Mean grad:  2.0 mmHg MV Vmax:       0.85 m/s    SHUNTS MV Vmean:      62.2 cm/s   Systemic VTI:  0.08 m MV Decel Time: 231 msec    Systemic Diam: 2.20 cm MV E velocity: 81.45 cm/s Serafina Royals MD Electronically signed by Serafina Royals MD Signature Date/Time: 03/28/2020/7:02:42 PM    Final    DG HIP OPERATIVE UNILAT W OR W/O PELVIS RIGHT  Result Date: 03/27/2020 CLINICAL DATA:  86 year old female with right femoral ORIF. EXAM: OPERATIVE right HIP (WITH PELVIS IF PERFORMED) 2 VIEWS TECHNIQUE: Fluoroscopic spot image(s) were submitted for interpretation post-operatively. COMPARISON:  None. FINDINGS: Eight intraoperative fluoroscopic spot images provided. The total fluoro time is 2 minutes 7 seconds and total air kerma of 20.6 mGy. Right femoral nail noted which appears intact. IMPRESSION: Status post right femoral nail. Electronically Signed   By: Anner Crete M.D.   On: 03/27/2020 18:28   DG FEMUR, MIN 2 VIEWS RIGHT  Result Date:  03/27/2020 CLINICAL DATA:  Postop femur fracture fixation. EXAM: RIGHT FEMUR 2 VIEWS COMPARISON:  Radiograph yesterday. FINDINGS: Intramedullary nail with trans trochanteric and distal locking screw with proximal cerclage wire fixation of proximal femur fracture. Fracture is in improved alignment compared to preoperative imaging. Recent postsurgical change includes air and edema in the soft tissues as well as skin staples. There are vascular calcifications. IMPRESSION: Post ORIF mid-proximal femur fracture without immediate postoperative complication. Electronically Signed   By: Keith Rake M.D.   On: 03/27/2020 21:17   _0 @ No results found.   ASSESSMENT AND PLAN    -Multidisciplinary rounds held today  Post-operative respiratory failure   - due to profound circulatory shock with AFrVR and acute blood loss anemia.    - remains on MV at this time, still requiring multiple vasopressors to reach MAP>65   -1l LR bolus   - h/h recheck and replete -continue Full MV support -continue Bronchodilator Therapy -Wean Fio2 and PEEP as tolerated -will perform SAT/SBT when respiratory parameters are met   Acute blood loss anemia   - due to hip fracture with large hematoma which was evacuated    - blood was repleted perioperatively   - monitor h/h ICU monitoring via telemetry   Atrial fibrillation with rapid ventricular response   - s/p amiodarone and IVF    -patient is rate controlled and much improved clinically   Displaced fracture of the  proximal right femur s/p Intramedullary Fixation on 03/27/20 -Ortho following, will follow recommendations   ID -continue IV abx as prescibed -follow up cultures  GI/Nutrition GI PROPHYLAXIS as indicated DIET-->TF's as tolerated Constipation protocol as indicated  ENDO - ICU hypoglycemic\Hyperglycemia protocol -check FSBS per protocol   ELECTROLYTES -follow labs as needed -replace as needed -pharmacy consultation   DVT/GI PRX  ordered -SCDs  TRANSFUSIONS AS NEEDED MONITOR FSBS ASSESS the need for LABS as needed   Critical care provider statement:    Critical care time (minutes):  33   Critical care time was exclusive of:  Separately billable procedures and treating other patients   Critical care was necessary to treat or prevent imminent or life-threatening deterioration of the following conditions:  acute hip fracture, acute blood loss anemia, AFRVR, shock   Critical care was time spent personally by me on the following activities:  Development of treatment plan with patient or surrogate, discussions with consultants, evaluation of patient's response to treatment, examination of patient, obtaining history from patient or surrogate, ordering and performing treatments and interventions, ordering and review of laboratory studies and re-evaluation of patient's condition.  I assumed direction of critical care for this patient from another provider in my specialty: no    This document was prepared using Dragon voice recognition software and may include unintentional dictation errors.    Ottie Glazier, M.D.  Division of [facility]

## 2020-03-30 LAB — BPAM RBC
Blood Product Expiration Date: 202107122359
Blood Product Expiration Date: 202107142359
Blood Product Expiration Date: 202107292359
Blood Product Expiration Date: 202107302359
Blood Product Expiration Date: 202108032359
Blood Product Expiration Date: 202108032359
ISSUE DATE / TIME: 202107081715
ISSUE DATE / TIME: 202107081715
ISSUE DATE / TIME: 202107090143
ISSUE DATE / TIME: 202107091529
ISSUE DATE / TIME: 202107100122
Unit Type and Rh: 600
Unit Type and Rh: 600
Unit Type and Rh: 6200
Unit Type and Rh: 6200
Unit Type and Rh: 6200
Unit Type and Rh: 6200

## 2020-03-30 LAB — RENAL FUNCTION PANEL
Albumin: 2.9 g/dL — ABNORMAL LOW (ref 3.5–5.0)
Anion gap: 8 (ref 5–15)
BUN: 29 mg/dL — ABNORMAL HIGH (ref 8–23)
CO2: 25 mmol/L (ref 22–32)
Calcium: 8 mg/dL — ABNORMAL LOW (ref 8.9–10.3)
Chloride: 110 mmol/L (ref 98–111)
Creatinine, Ser: 0.74 mg/dL (ref 0.44–1.00)
GFR calc Af Amer: >60 mL/min (ref >60)
GFR calc non Af Amer: >60 mL/min (ref >60)
Glucose, Bld: 143 mg/dL — ABNORMAL HIGH (ref 70–99)
Phosphorus: 1.9 mg/dL — ABNORMAL LOW (ref 2.5–4.6)
Potassium: 4.1 mmol/L (ref 3.5–5.1)
Sodium: 143 mmol/L (ref 135–145)

## 2020-03-30 LAB — GLUCOSE, CAPILLARY
Glucose-Capillary: 110 mg/dL — ABNORMAL HIGH (ref 70–99)
Glucose-Capillary: 121 mg/dL — ABNORMAL HIGH (ref 70–99)
Glucose-Capillary: 131 mg/dL — ABNORMAL HIGH (ref 70–99)
Glucose-Capillary: 135 mg/dL — ABNORMAL HIGH (ref 70–99)
Glucose-Capillary: 142 mg/dL — ABNORMAL HIGH (ref 70–99)
Glucose-Capillary: 149 mg/dL — ABNORMAL HIGH (ref 70–99)
Glucose-Capillary: 152 mg/dL — ABNORMAL HIGH (ref 70–99)

## 2020-03-30 LAB — BASIC METABOLIC PANEL
Anion gap: 6 (ref 5–15)
BUN: 33 mg/dL — ABNORMAL HIGH (ref 8–23)
CO2: 25 mmol/L (ref 22–32)
Calcium: 7.9 mg/dL — ABNORMAL LOW (ref 8.9–10.3)
Chloride: 108 mmol/L (ref 98–111)
Creatinine, Ser: 0.75 mg/dL (ref 0.44–1.00)
GFR calc Af Amer: >60 mL/min (ref >60)
GFR calc non Af Amer: >60 mL/min (ref >60)
Glucose, Bld: 137 mg/dL — ABNORMAL HIGH (ref 70–99)
Potassium: 4 mmol/L (ref 3.5–5.1)
Sodium: 139 mmol/L (ref 135–145)

## 2020-03-30 LAB — TYPE AND SCREEN
ABO/RH(D): A POS
Antibody Screen: NEGATIVE
Unit division: 0
Unit division: 0
Unit division: 0
Unit division: 0
Unit division: 0
Unit division: 0

## 2020-03-30 LAB — PHOSPHORUS: Phosphorus: 2 mg/dL — ABNORMAL LOW (ref 2.5–4.6)

## 2020-03-30 LAB — CBC
HCT: 22.4 % — ABNORMAL LOW (ref 36.0–46.0)
Hemoglobin: 7.7 g/dL — ABNORMAL LOW (ref 12.0–15.0)
MCH: 31.6 pg (ref 26.0–34.0)
MCHC: 34.4 g/dL (ref 30.0–36.0)
MCV: 91.8 fL (ref 80.0–100.0)
Platelets: 80 10*3/uL — ABNORMAL LOW (ref 150–400)
RBC: 2.44 MIL/uL — ABNORMAL LOW (ref 3.87–5.11)
RDW: 15.3 % (ref 11.5–15.5)
WBC: 14 10*3/uL — ABNORMAL HIGH (ref 4.0–10.5)
nRBC: 0.4 % — ABNORMAL HIGH (ref 0.0–0.2)

## 2020-03-30 LAB — URINE CULTURE: Culture: 50000 — AB

## 2020-03-30 LAB — MAGNESIUM: Magnesium: 2.1 mg/dL (ref 1.7–2.4)

## 2020-03-30 MED ORDER — METOPROLOL TARTRATE 25 MG PO TABS
25.0000 mg | ORAL_TABLET | Freq: Two times a day (BID) | ORAL | Status: DC
  Administered 2020-03-30 – 2020-04-02 (×7): 25 mg via ORAL
  Filled 2020-03-30 (×6): qty 1

## 2020-03-30 MED ORDER — HYDROCORTISONE NA SUCCINATE PF 100 MG IJ SOLR
50.0000 mg | Freq: Two times a day (BID) | INTRAMUSCULAR | Status: DC
  Administered 2020-03-30 – 2020-03-31 (×2): 50 mg via INTRAVENOUS
  Filled 2020-03-30 (×2): qty 1
  Filled 2020-03-30: qty 2

## 2020-03-30 MED ORDER — FAMOTIDINE 20 MG PO TABS
20.0000 mg | ORAL_TABLET | Freq: Two times a day (BID) | ORAL | Status: DC
  Administered 2020-03-30 – 2020-04-02 (×6): 20 mg via ORAL
  Filled 2020-03-30 (×6): qty 1

## 2020-03-30 MED ORDER — K PHOS MONO-SOD PHOS DI & MONO 155-852-130 MG PO TABS
500.0000 mg | ORAL_TABLET | Freq: Once | ORAL | Status: AC
  Administered 2020-03-30: 500 mg via ORAL
  Filled 2020-03-30 (×2): qty 2

## 2020-03-30 NOTE — Progress Notes (Signed)
CRITICAL CARE PROGRESS NOTE    Name: Alison Russo MRN: [id number] DOB: 1926/01/31     LOS: 4   SUBJECTIVE FINDINGS & SIGNIFICANT EVENTS    Patient description:  86 y.o. Female admitted 03/26/20 with Right displaced Femoral Shaft Fracture.  She underwent Intramedullary Fixation of the right femoral artery shaft fracture on 03/27/20. Intraoperatively she was noted to have persistent venous oozing from the vastus lateralis and fracture site, with approximately 1200 cc of blood loss.  She was given 2 units pRBC's intraoperatively. She returns to ICU post procedure and remains intubated.  Upon arrival to ICU noted to have severe shock and atrial fibrillation with RVR.  SIGNIFICANT EVENTS  7/7: Admission to [place] 7/8: Underwent Intramedullary Fixation of fracture  7/8: Returns to ICU post procedure and remains intubated; with severe shock and A-fib w/ RVR 7/8: Right IJ CVC placed 7/9- met with daughter at bedside and reivewed hospital course and care plan.  03/29/20- patient had blood transfusion overnight, she has improved clinically, able to speak appropriately. Will work on optimizing for [facility] 03/30/20- patient clinically improved - for TRH transfer today  Lines/tubes : Airway 7 mm (Active)  Secured at (cm) 21 cm 03/28/20 1200  Measured From Lips 03/28/20 [place] 03/28/20 1200  Secured By [employer] 03/28/20 1121  Tube Holder Repositioned Yes 03/28/20 1200  Cuff Pressure (cm H2O) 26 cm H2O 03/28/20 0815  Site Condition Dry 03/28/20 1200     CVC Triple Lumen 03/27/20 Right Internal jugular (Active)  Indication for Insertion or Continuance of Line Vasoactive infusions 03/28/20 1200  Site Assessment Clean;Intact;Dry 03/28/20 1200  Proximal Lumen Status Infusing 03/28/20 1200    Medial Lumen Status Infusing 03/28/20 1200  Distal Lumen Status Infusing 03/28/20 1200  Dressing Type Transparent 03/28/20 1200  Dressing Status Clean;Dry;Intact;Antimicrobial disc in place 03/28/20 1200  Line Care Connections checked and tightened 03/28/20 1200  Dressing Change Due 04/02/20 03/28/20 0800     Arterial Line 03/28/20 Left Radial (Active)  Site Assessment Clean;Dry;Intact 03/28/20 1200  Line Status Pulsatile blood flow 03/28/20 1200  Art Line Waveform Appropriate;Square wave test performed 03/28/20 1200  Art Line Interventions Zeroed and calibrated;Leveled;Connections checked and tightened;Flushed per protocol 03/28/20 1200  Color/Movement/Sensation Capillary refill less than 3 sec 03/28/20 1200  Dressing Type Transparent 03/28/20 1200  Dressing Status Clean;Dry;Intact;Antimicrobial disc in place 03/28/20 1200  Dressing Change Due 04/03/20 03/28/20 0800     Urethral Catheter b kelly Double-lumen;Non-latex 16 Fr. (Active)  Indication for Insertion or Continuance of Catheter Peri-operative use for selective surgical procedure - not to exceed 24 hours post-op 03/28/20 0738  Site Assessment Clean;Intact;Dry 03/28/20 0758  Catheter Maintenance Bag below level of bladder;Drainage bag/tubing not touching floor;No dependent loops;Seal intact;Catheter secured;Insertion date on drainage bag;Bag emptied prior to transport 03/28/20 0758  Collection Container Standard drainage bag 03/28/20 0758  Securement Method Securing device (Describe) 03/28/20 0758  Urinary Catheter Interventions (if applicable) Unclamped 76/22/63 0758  Output (mL) 40 mL 03/28/20 1300    Microbiology/Sepsis markers: Results for orders placed or performed during the hospital encounter of 03/26/20  SARS Coronavirus 2 by RT PCR (hospital order, performed in [facility] hospital lab) Nasopharyngeal Nasopharyngeal Swab     Status: None   Collection Time: 03/26/20  5:00 PM   Specimen: Nasopharyngeal Swab  Result  Value Ref Range Status   SARS Coronavirus 2 NEGATIVE NEGATIVE Final    Comment: (NOTE) SARS-CoV-2 target nucleic acids are NOT DETECTED.  The SARS-CoV-2 RNA  is generally detectable in upper and lower respiratory specimens during the acute phase of infection. The lowest concentration of SARS-CoV-2 viral copies this assay can detect is 250 copies / mL. A negative result does not preclude SARS-CoV-2 infection and should not be used as the sole basis for treatment or other patient management decisions.  A negative result may occur with improper specimen collection / handling, submission of specimen other than nasopharyngeal swab, presence of viral mutation(s) within the areas targeted by this assay, and inadequate number of viral copies (<250 copies / mL). A negative result must be combined with clinical observations, patient history, and epidemiological information.  Fact Sheet for Patients:   StrictlyIdeas.no  Fact Sheet for Healthcare Providers: BankingDealers.co.za  This test is not yet approved or  cleared by the [place] FDA and has been authorized for detection and/or diagnosis of SARS-CoV-2 by FDA under an Emergency Use Authorization (EUA).  This EUA will remain in effect (meaning this test can be used) for the duration of the COVID-19 declaration under Section 564(b)(1) of the Act, 21 U.S.C. section 360bbb-3(b)(1), unless the authorization is terminated or revoked sooner.  Performed at [facility], [address] [place], [place] [address]   Surgical pcr screen     Status: Abnormal   Collection Time: 03/27/20  4:42 AM  Result Value Ref Range Status   MRSA, PCR NEGATIVE NEGATIVE Final   Staphylococcus aureus POSITIVE (A) NEGATIVE Final    Comment: (NOTE) The Xpert SA Assay (FDA approved for NASAL specimens in patients 37 years of age and older), is one component of a comprehensive surveillance program. It is  not intended to diagnose infection nor to guide or monitor treatment. Performed at [facility], [address] [place], [place] [address]   CULTURE, BLOOD (ROUTINE X 2) w Reflex to ID Panel     Status: None (Preliminary result)   Collection Time: 03/27/20  8:57 PM   Specimen: BLOOD LEFT HAND  Result Value Ref Range Status   Specimen Description BLOOD LEFT HAND  Final   Special Requests   Final    BOTTLES DRAWN AEROBIC ONLY Blood Culture results may not be optimal due to an inadequate volume of blood received in culture bottles   Culture   Final    NO GROWTH 3 DAYS Performed at [facility], [address] [place], [place] [address]    Report Status PENDING  Incomplete  CULTURE, BLOOD (ROUTINE X 2) w Reflex to ID Panel     Status: None (Preliminary result)   Collection Time: 03/27/20  9:02 PM   Specimen: BLOOD RIGHT HAND  Result Value Ref Range Status   Specimen Description BLOOD RIGHT HAND  Final   Special Requests   Final    BOTTLES DRAWN AEROBIC ONLY Blood Culture adequate volume   Culture   Final    NO GROWTH 3 DAYS Performed at [facility], [address] [place], [place] [address]    Report Status PENDING  Incomplete  Urine Culture     Status: Abnormal   Collection Time: 03/27/20 11:59 PM   Specimen: Urine, Clean Catch  Result Value Ref Range Status   Specimen Description   Final    URINE, CLEAN CATCH Performed at [facility], [address] [place], [place] [address]    Special Requests   Final    NONE Performed at [facility], [address], [place], [place] [address]    Culture 50,000 COLONIES/mL  KLEBSIELLA PNEUMONIAE (A)  Final   Report Status 03/30/2020 FINAL  Final   Organism ID, Bacteria KLEBSIELLA PNEUMONIAE (A)  Final      Susceptibility   Klebsiella pneumoniae - MIC*    AMPICILLIN RESISTANT Resistant     CEFAZOLIN <=4 SENSITIVE Sensitive     CEFTRIAXONE <=0.25 SENSITIVE Sensitive      CIPROFLOXACIN <=0.25 SENSITIVE Sensitive     GENTAMICIN <=1 SENSITIVE Sensitive     IMIPENEM <=0.25 SENSITIVE Sensitive     NITROFURANTOIN 32 SENSITIVE Sensitive     TRIMETH/SULFA <=20 SENSITIVE Sensitive     AMPICILLIN/SULBACTAM 4 SENSITIVE Sensitive     PIP/TAZO <=4 SENSITIVE Sensitive     * 50,000 COLONIES/mL KLEBSIELLA PNEUMONIAE    Anti-infectives:  Anti-infectives (From admission, onward)   Start     Dose/Rate Route Frequency Ordered Stop   03/27/20 2330  ceFAZolin (ANCEF) IVPB 1 g/50 mL premix     Discontinue     1 g 100 mL/hr over 30 Minutes Intravenous Every 8 hours 03/27/20 2315     03/27/20 1843  gentamicin 80 mg in 0.9% sodium chloride 250 mL irrigation  Status:  Discontinued          As needed 03/27/20 1843 03/27/20 1848   03/27/20 1423  ceFAZolin (ANCEF) 2-4 GM/100ML-% IVPB       Note to Pharmacy: Milinda Cave   : cabinet override      03/27/20 1423 03/27/20 1533   03/27/20 0600  ceFAZolin (ANCEF) IVPB 2g/100 mL premix        2 g 200 mL/hr over 30 Minutes Intravenous 30 min pre-op 03/26/20 2142 03/27/20 1528       Consults: Treatment Team:  [place], MD Corey Skains, MD     Tests / Events: TTE 03/28/20    PAST MEDICAL HISTORY   Past Medical History:  Diagnosis Date   Anginal pain (HCC)    CAD (coronary artery disease)    Heart murmur    Hypertension    Mood disorder ([place])    mostly anxiety   NSTEMI (non-ST elevated myocardial infarction) ([place]) 12/22/2017   OSA on CPAP    PE (pulmonary embolism)    recurrent     SURGICAL HISTORY   Past Surgical History:  Procedure Laterality Date   BREAST CYST ASPIRATION Right    neg   CARDIAC CATHETERIZATION     CATARACT EXTRACTION, BILATERAL     COLONOSCOPY     CORONARY STENT INTERVENTION N/A 12/23/2017   Procedure: CORONARY STENT INTERVENTION;  Surgeon: Martinique, Peter M, MD;  Location: [place] [facility];  Service: Cardiovascular;  Laterality: N/A;   EYE SURGERY Left     "blood vessel busted behind my eye"   EYE SURGERY Right    ?vitrectomy   HYSTEROSCOPY WITH D & C N/A 10/25/2016   Procedure: DILATATION AND CURETTAGE /HYSTEROSCOPY, POLYPECTOMY;  Surgeon: Benjaman Kindler, MD;  Location: [facility] OR[facility];  Service: Gynecology;  Laterality: N/A;   INGUINAL HERNIA REPAIR Right 1950s   INTRAMEDULLARY (IM) NAIL INTERTROCHANTERIC Right 03/27/2020   Procedure: INTRAMEDULLARY (IM) NAIL INTERTROCHANTRIC;  Surgeon: Thornton Park, MD;  Location: [facility] OR[facility];  Service: Orthopedics;  Laterality: Right;   LEFT HEART CATH AND CORONARY ANGIOGRAPHY N/A 12/21/2017   Procedure: LEFT HEART CATH AND CORONARY ANGIOGRAPHY possible PCI and stent;  Surgeon: Yolonda Kida, MD;  Location: [place] [facility];  Service: Cardiovascular;  Laterality: N/A;   LOWER EXTREMITY ANGIOGRAPHY Right 01/11/2018   Procedure: LOWER EXTREMITY ANGIOGRAPHY;  Surgeon: Algernon Huxley, MD;  Location: [place] [facility];  Service: Cardiovascular;  Laterality: Right;   PSEUDOANERYSM COMPRESSION Left 01/20/2018   Procedure: PSEUDOANERYSM COMPRESSION;  Surgeon: Katha Cabal, MD;  Location: [place] [facility];  Service: Cardiovascular;  Laterality: Left;   TONSILLECTOMY       FAMILY HISTORY   Family History  Problem Relation Age of Onset   Breast cancer Mother 48   AAA (abdominal aortic aneurysm) Father        ruptured   Coronary artery disease Brother      SOCIAL HISTORY   Social History   Tobacco Use   Smoking status: Never Smoker   Smokeless tobacco: Never Used  Vaping Use   Vaping Use: Never used  Substance Use Topics   Alcohol use: Never   Drug use: Never     MEDICATIONS   Current Medication:  Current Facility-Administered Medications:    0.9 %  sodium chloride infusion, , Intravenous, PRN, Ottie Glazier, MD, Paused at 03/29/20 0814   albumin human 25 % solution 12.5 g, 12.5 g, Intravenous, Daily, Cadyn Rodger, MD, Last Rate: 60 mL/hr at 03/30/20 0953, 12.5 g  at 03/30/20 0953   ALPRAZolam (XANAX) tablet 0.25 mg, 0.25 mg, Oral, Once, Ottie Glazier, MD   alum & mag hydroxide-simeth (MAALOX/MYLANTA) 200-200-20 MG/5ML suspension 15 mL, 15 mL, Oral, Q6H PRN, Enzo Bi, MD   atorvastatin (LIPITOR) tablet 80 mg, 80 mg, Oral, q1800, Enzo Bi, MD, 80 mg at 03/29/20 1811   calcium carbonate (TUMS - dosed in mg elemental calcium) chewable tablet 200 mg of elemental calcium, 1 tablet, Oral, TID PRN, Enzo Bi, MD   ceFAZolin (ANCEF) IVPB 1 g/50 mL premix, 1 g, Intravenous, Q8H, Darel Hong D, NP, Last Rate: 100 mL/hr at 03/30/20 0533, 1 g at 03/30/20 0533   Chlorhexidine Gluconate Cloth 2 % PADS 6 each, 6 each, Topical, Daily, Darel Hong D, NP, 6 each at 03/30/20 1006   digoxin (LANOXIN) 0.25 MG/ML injection 0.125 mg, 0.125 mg, Intravenous, Daily, Lanney Gins, Lantz Hermann, MD, 0.125 mg at 03/30/20 0955   docusate (COLACE) 50 MG/5ML liquid 100 mg, 100 mg, Oral, BID, Darel Hong D, NP, 100 mg at 03/30/20 0956   docusate sodium (COLACE) capsule 100 mg, 100 mg, Oral, BID PRN, Enzo Bi, MD   famotidine (PEPCID) IVPB 20 mg premix, 20 mg, Intravenous, Q12H, Darel Hong D, NP, Last Rate: 100 mL/hr at 03/30/20 1002, 20 mg at 03/30/20 1002   fentaNYL (SUBLIMAZE) bolus via infusion 25 mcg, 25 mcg, Intravenous, Q15 min PRN, Darel Hong D, NP   fentaNYL (SUBLIMAZE) injection 25 mcg, 25 mcg, Intravenous, Once, Darel Hong D, NP   guaiFENesin-dextromethorphan (ROBITUSSIN DM) 100-10 MG/5ML syrup 10 mL, 10 mL, Oral, Q6H PRN, Enzo Bi, MD   hydrocortisone sodium succinate (SOLU-CORTEF) 100 MG injection 50 mg, 50 mg, Intravenous, Q6H, Darel Hong D, NP, 50 mg at 03/30/20 0955   insulin aspart (novoLOG) injection 0-15 Units, 0-15 Units, Subcutaneous, Q4H, Darel Hong D, NP, 2 Units at 03/30/20 0533   ipratropium-albuterol (DUONEB) 0.5-2.5 (3) MG/3ML nebulizer solution 3 mL, 3 mL, Nebulization, Q4H PRN, Bradly Bienenstock, NP   lactated  ringers infusion, , Intravenous, Continuous, Molli Barrows, MD, Stopped at 03/28/20 2001   methocarbamol (ROBAXIN) 500 mg in dextrose 5 % 50 mL IVPB, 500 mg, Intravenous, Q8H PRN, [place], MD, Stopped at 03/27/20 1322   metoprolol tartrate (LOPRESSOR) tablet 25 mg, 25 mg, Oral, BID, Corey Skains, MD  morphine 4 MG/ML injection 4 mg, 4 mg, Intravenous, Q3H PRN, Enzo Bi, MD, 4 mg at 03/29/20 0101   mupirocin ointment (BACTROBAN) 2 % 1 application, 1 application, Nasal, BID, Ottie Glazier, MD, 1 application at 67/67/20 1004   ondansetron (ZOFRAN) injection 4 mg, 4 mg, Intravenous, Q6H PRN, Enzo Bi, MD   ondansetron (ZOFRAN-ODT) disintegrating tablet 4 mg, 4 mg, Oral, Q8H PRN, Enzo Bi, MD   phosphorus (K PHOS NEUTRAL) tablet 500 mg, 500 mg, Oral, Once, Dallie Piles, RPH   polyethylene glycol (MIRALAX / GLYCOLAX) packet 17 g, 17 g, Oral, BID PRN, Enzo Bi, MD   polyethylene glycol (MIRALAX / GLYCOLAX) packet 17 g, 17 g, Oral, Daily, Darel Hong D, NP, 17 g at 03/30/20 0957   sertraline (ZOLOFT) tablet 100 mg, 100 mg, Oral, Daily, Enzo Bi, MD, 100 mg at 03/30/20 1010   sodium chloride flush (NS) 0.9 % injection 10-40 mL, 10-40 mL, Intracatheter, Q12H, Darel Hong D, NP, 10 mL at 03/29/20 2152   sodium chloride flush (NS) 0.9 % injection 10-40 mL, 10-40 mL, Intracatheter, PRN, Bradly Bienenstock, NP   traZODone (DESYREL) tablet 25 mg, 25 mg, Oral, QHS PRN, Enzo Bi, MD, 25 mg at 03/29/20 2028    ALLERGIES   Patient has no known allergies.    REVIEW OF SYSTEMS    Unable to obtain due to MV  PHYSICAL EXAMINATION   Vital Signs: Temp:  [98 F (36.7 C)-98.9 F (37.2 C)] 98.3 F (36.8 C) (07/10 2000) Pulse Rate:  [28-125] 101 (07/11 0300) Resp:  [17-25] 22 (07/11 0300) BP: (93-119)/(40-71) 97/57 (07/11 0300) SpO2:  [72 %-100 %] 96 % (07/11 0300)  GENERAL:Sedated on MV , age approprate HEAD: Normocephalic, atraumatic.  EYES: Pupils equal,  round, reactive to light.  No scleral icterus.  MOUTH: Moist mucosal membrane. NECK: Supple. No thyromegaly. No nodules. No JVD.  PULMONARY: mild rhonchi CARDIOVASCULAR: S1 and S2. Regular rate and rhythm. No murmurs, rubs, or gallops.  GASTROINTESTINAL: Soft, nontender, non-distended. No masses. Positive bowel sounds. No hepatosplenomegaly.  MUSCULOSKELETAL: No swelling, clubbing, or edema.  NEUROLOGIC: GCS4T SKIN:intact,warm,dry   PERTINENT DATA     Infusions:  sodium chloride Stopped (03/29/20 0814)   albumin human 12.5 g (03/30/20 0953)    ceFAZolin (ANCEF) IV 1 g (03/30/20 0533)   famotidine (PEPCID) IV 20 mg (03/30/20 1002)   lactated ringers Stopped (03/28/20 2001)   methocarbamol (ROBAXIN) IV Stopped (03/27/20 1322)   Scheduled Medications:  ALPRAZolam  0.25 mg Oral Once   atorvastatin  80 mg Oral q1800   Chlorhexidine Gluconate Cloth  6 each Topical Daily   digoxin  0.125 mg Intravenous Daily   docusate  100 mg Oral BID   fentaNYL (SUBLIMAZE) injection  25 mcg Intravenous Once   hydrocortisone sod succinate (SOLU-CORTEF) inj  50 mg Intravenous Q6H   insulin aspart  0-15 Units Subcutaneous Q4H   metoprolol tartrate  25 mg Oral BID   mupirocin ointment  1 application Nasal BID   phosphorus  500 mg Oral Once   polyethylene glycol  17 g Oral Daily   sertraline  100 mg Oral Daily   sodium chloride flush  10-40 mL Intracatheter Q12H   PRN Medications: sodium chloride, alum & mag hydroxide-simeth, calcium carbonate, docusate sodium, fentaNYL, guaiFENesin-dextromethorphan, ipratropium-albuterol, methocarbamol (ROBAXIN) IV, morphine injection, ondansetron (ZOFRAN) IV, ondansetron, polyethylene glycol, sodium chloride flush, traZODone Hemodynamic parameters: CVP:  [10 mmHg] 10 mmHg Intake/Output: 07/10 0701 - 07/11 0700 In: 1435.3 [P.O.:480; I.V.:516.7; IV Piggyback:438.[place]  Out: 1110 [Urine:1110]  Ventilator  Settings:     LAB RESULTS:  Basic  Metabolic Panel: Recent Labs  Lab 03/27/20 0509 03/27/20 0509 03/27/20 2122 03/27/20 2122 03/28/20 0040 03/28/20 0040 03/28/20 1249 03/28/20 1249 03/29/20 0519 03/30/20 0535  NA 139   < > 138  --  137  --  138  --  137 139  K 4.4   < > 4.5   < > 4.5   < > 4.3   < > 3.6 4.0  CL 108   < > 108  --  111  --  106  --  106 108  CO2 23  --  26  --  23  --   --   --  24 25  GLUCOSE 139*   < > 189*  --  222*  --  160*  --  145* 137*  BUN 18   < > 24*  --  25*  --  27*  --  35* 33*  CREATININE 0.76   < > 0.82  --  0.81  --  0.80  --  0.99 0.75  CALCIUM 8.8*  --  7.5*  --  7.3*  --   --   --  7.4* 7.9*  MG 1.9  --   --   --  1.5*  --   --   --  2.0 2.1  PHOS  --   --   --   --   --   --   --   --   --  2.0*   < > = values in this interval not displayed.   Liver Function Tests: Recent Labs  Lab 03/27/20 2122  AST 30  ALT 14  ALKPHOS 34*  BILITOT 1.4*  PROT 4.0*  ALBUMIN 2.3*   No results for input(s): LIPASE, AMYLASE in the last 168 hours. No results for input(s): AMMONIA in the last 168 hours. CBC: Recent Labs  Lab 03/26/20 1506 03/27/20 0509 03/27/20 2122 03/27/20 2122 03/28/20 0040 03/28/20 0630 03/28/20 1249 03/28/20 1450 03/28/20 2300 03/29/20 0519 03/30/20 0535  WBC 8.9   < > 17.6*  --  21.0*  --   --   --  18.5* 18.2* 14.0*  NEUTROABS 6.7  --  15.6*  --   --   --   --   --   --   --   --   HGB 12.6   < > 8.5*   < > 7.9*   < > 8.2* 7.9* 7.0* 7.9* 7.7*  HCT 36.2   < > 24.6*   < > 22.8*   < > 24.0* 22.5* 20.4* 22.6* 22.4*  MCV 87.7   < > 91.4  --  90.8  --   --   --  90.3 90.0 91.8  PLT 148*   < > 85*  --  87*  --   --   --  85* 85* 80*   < > = values in this interval not displayed.   Cardiac Enzymes: No results for input(s): CKTOTAL, CKMB, CKMBINDEX, TROPONINI in the last 168 hours. BNP: Invalid input(s): POCBNP CBG: Recent Labs  Lab 03/29/20 1618 03/29/20 1946 03/30/20 0154 03/30/20 0530 03/30/20 0800  GLUCAP 121* 113* 152* 131* 110*        IMAGING RESULTS:  Imaging: No results found. '@PROBHOSP' @ No results found.   ASSESSMENT AND PLAN    -Multidisciplinary rounds held today  Post-operative respiratory failure   - due to profound  circulatory shock with AFrVR and acute blood loss anemia.    - remains on MV at this time, still requiring multiple vasopressors to reach MAP>65   -1l LR bolus   - h/h recheck and replete -extubated to [facility] 03/28/20 -able to participate with MetaNE[facility] - atelctasis improved   Acute blood loss anemia   - due to hip fracture with large hematoma which was evacuated    - blood was repleted perioperatively with subsequent more transfusion overnight 7/10   - monitor h/h ICU monitoring via telemetry   Atrial fibrillation with rapid ventricular response-    - s/p amiodarone and IVF    -patient is rate controlled and much improved clinically   Displaced fracture of the proximal right femur s/p Intramedullary Fixation on 03/27/20 -Ortho following, will follow recommendations   ID -continue IV abx as prescibed -follow up cultures  GI/Nutrition GI PROPHYLAXIS as indicated DIET-->TF's as tolerated Constipation protocol as indicated  ENDO - ICU hypoglycemic\Hyperglycemia protocol -check FSBS per protocol   ELECTROLYTES -follow labs as needed -replace as needed -pharmacy consultation   DVT/GI PRX ordered -SCDs  TRANSFUSIONS AS NEEDED MONITOR FSBS ASSESS the need for LABS as needed   Critical care provider statement:    Critical care time (minutes):  33   Critical care time was exclusive of:  Separately billable procedures and treating other patients   Critical care was necessary to treat or prevent imminent or life-threatening deterioration of the following conditions:  acute hip fracture, acute blood loss anemia, AFRVR, shock   Critical care was time spent personally by me on the following activities:  Development of treatment plan with patient or surrogate, discussions  with consultants, evaluation of patient's response to treatment, examination of patient, obtaining history from patient or surrogate, ordering and performing treatments and interventions, ordering and review of laboratory studies and re-evaluation of patient's condition.  I assumed direction of critical care for this patient from another provider in my specialty: no    This document was prepared using Dragon voice recognition software and may include unintentional dictation errors.    Ottie Glazier, M.D.  Division of [place]

## 2020-03-30 NOTE — Progress Notes (Signed)
Subjective: 3 Days Post-Op Procedure(s) (LRB): INTRAMEDULLARY (IM) NAIL INTERTROCHANTRIC (Right)   Very alert and oriented.  Less pain today.  hgb 7.7 and stable.  Off vasopressors. Dressing dry. CSM good right leg.   Patient reports pain as mild.  Objective:   VITALS:   Vitals:   03/30/20 1100 03/30/20 1200  BP: 113/88 108/69  Pulse: (!) 131 (!) 106  Resp: (!) 28 (!) 24  Temp:    SpO2: (!) 87% 90%    Neurologically intact Sensation intact distally Intact pulses distally Dorsiflexion/Plantar flexion intact Incision: dressing C/D/I  LABS Recent Labs    03/28/20 2300 03/29/20 0519 03/30/20 0535  HGB 7.0* 7.9* 7.7*  HCT 20.4* 22.6* 22.4*  WBC 18.5* 18.2* 14.0*  PLT 85* 85* 80*    Recent Labs    03/29/20 0519 03/30/20 0535 03/30/20 1118  NA 137 139 143  K 3.6 4.0 4.1  BUN 35* 33* 29*  CREATININE 0.99 0.75 0.74  GLUCOSE 145* 137* 143*    Recent Labs    03/27/20 2122  INR 1.6*     Assessment/Plan: 3 Days Post-Op Procedure(s) (LRB): INTRAMEDULLARY (IM) NAIL INTERTROCHANTRIC (Right)   Up with therapy Discharge to SNF when stable.

## 2020-03-30 NOTE — Progress Notes (Signed)
[facility] Encounter Note  Patient: Alison Russo / Admit Date: 03/26/2020 / Date of Encounter: 03/30/2020, 9:57 AM   Subjective: Patient appears to be tolerating extubation well. She is getting inhalers and pulmonary toilet at this time. Blood pressure slightly improved . Heart rate of atrial fibrillation is slightly elevated with some movement in the bed.  Currently there is no evidence of chest discomfort congestive heart failure or acute coronary syndrome based on issues. Patient has tolerated heparin for further risk reduction in stroke with continued atrial fibrillation  Review of Systems: Positive for: Shortness of breath Negative for: Vision change, hearing change, syncope, dizziness, nausea, vomiting,diarrhea, bloody stool, stomach pain, cough, congestion, diaphoresis, urinary frequency, urinary pain,skin lesions, skin rashes Others previously listed  Objective: Telemetry: Atrial fibrillation with controlled ventricular rate Physical Exam: Blood pressure (!) 97/57, pulse (!) 101, temperature 98.3 F (36.8 C), temperature source Oral, resp. rate (!) 22, height 5\' 6"  (1.676 m), weight 89.8 kg, SpO2 96 %. Body mass index is 31.95 kg/m. General: Well developed, well nourished, in no acute distress. Head: Normocephalic, atraumatic, sclera non-icteric, no xanthomas, nares are without discharge. Neck: No apparent masses Lungs: Normal respirations with few wheezes, some rhonchi, no rales , no crackles   Heart: Irregular rate and rhythm, normal S1 S2, no murmur, no rub, no gallop, PMI is normal size and placement, carotid upstroke normal without bruit, jugular venous pressure normal Abdomen: Soft, non-tender, non-distended with normoactive bowel sounds. No hepatosplenomegaly. Abdominal aorta is normal size without bruit Extremities: Trace to 1+ edema, no clubbing, no cyanosis, no ulcers,  Peripheral: 2+ radial, 2+ femoral, 2+ dorsal pedal pulses Neuro: Alert and  oriented. Moves all extremities spontaneously. Psych:  Responds to questions appropriately with a normal affect.   Intake/Output Summary (Last 24 hours) at 03/30/2020 0957 Last data filed at 03/30/2020 0300 Gross per 24 hour  Intake 382.13 ml  Output 1050 ml  Net -667.87 ml    Inpatient Medications:  . ALPRAZolam  0.25 mg Oral Once  . atorvastatin  80 mg Oral q1800  . Chlorhexidine Gluconate Cloth  6 each Topical Daily  . digoxin  0.125 mg Intravenous Daily  . docusate  100 mg Oral BID  . fentaNYL (SUBLIMAZE) injection  25 mcg Intravenous Once  . hydrocortisone sod succinate (SOLU-CORTEF) inj  50 mg Intravenous Q6H  . insulin aspart  0-15 Units Subcutaneous Q4H  . metoprolol tartrate  25 mg Oral BID  . mupirocin ointment  1 application Nasal BID  . phosphorus  500 mg Oral Once  . polyethylene glycol  17 g Oral Daily  . sertraline  100 mg Oral Daily  . sodium chloride flush  10-40 mL Intracatheter Q12H   Infusions:  . sodium chloride Stopped (03/29/20 0814)  . albumin human 12.5 g (03/30/20 0953)  .  ceFAZolin (ANCEF) IV 1 g (03/30/20 0533)  . famotidine (PEPCID) IV Stopped (03/29/20 2252)  . lactated ringers Stopped (03/28/20 2001)  . methocarbamol (ROBAXIN) IV Stopped (03/27/20 1322)    Labs: Recent Labs    03/29/20 0519 03/30/20 0535  NA 137 139  K 3.6 4.0  CL 106 108  CO2 24 25  GLUCOSE 145* 137*  BUN 35* 33*  CREATININE 0.99 0.75  CALCIUM 7.4* 7.9*  MG 2.0 2.1  PHOS  --  2.0*   Recent Labs    03/27/20 2122  AST 30  ALT 14  ALKPHOS 34*  BILITOT 1.4*  PROT 4.0*  ALBUMIN 2.3*  Recent Labs    03/27/20 2122 03/28/20 0040 03/29/20 0519 03/30/20 0535  WBC 17.6*   < > 18.2* 14.0*  NEUTROABS 15.6*  --   --   --   HGB 8.5*   < > 7.9* 7.7*  HCT 24.6*   < > 22.6* 22.4*  MCV 91.4   < > 90.0 91.8  PLT 85*   < > 85* 80*   < > = values in this interval not displayed.   No results for input(s): CKTOTAL, CKMB, TROPONINI in the last 72 hours. Invalid  input(s): POCBNP Recent Labs    03/28/20 0040  HGBA1C 5.6     Weights: Filed Weights   03/26/20 1440 03/27/20 1428  Weight: 89.8 kg 89.8 kg     Radiology/Studies:  DG Chest 1 View  Result Date: 03/26/2020 CLINICAL DATA:  86 year old female with right femoral fracture. EXAM: CHEST  1 VIEW COMPARISON:  Chest radiograph dated 12/20/2017. FINDINGS: No focal consolidation, pleural effusion, or pneumothorax. Mild chronic interstitial coarsening. There is stable cardiomegaly. Osteopenia. No acute osseous pathology. IMPRESSION: No acute cardiopulmonary process. Electronically Signed   By: Elgie Collard M.D.   On: 03/26/2020 16:28   DG Pelvis 1-2 Views  Result Date: 03/26/2020 CLINICAL DATA:  86 year old female with fall and right hip pain. EXAM: RIGHT FEMUR 2 VIEWS; PELVIS - 1-2 VIEW COMPARISON:  CT abdomen pelvis dated 01/19/2018. FINDINGS: There is a displaced oblique or spiral fracture of the proximal right femur extending from the femoral neck into the proximal femoral diaphysis. There is full shaft width medial displacement of the distal fracture fragment. There is no dislocation. The bones are osteopenic. Vascular calcification as well as calcified fibroid over the left pelvis. The soft tissues are grossly unremarkable. IMPRESSION: Displaced oblique or spiral fracture of the proximal right femur. Electronically Signed   By: Elgie Collard M.D.   On: 03/26/2020 16:28   DG Chest Port 1 View  Result Date: 03/28/2020 CLINICAL DATA:  Acute respiratory failure. EXAM: PORTABLE CHEST 1 VIEW COMPARISON:  03/27/2020. FINDINGS: Endotracheal tube, right IJ line in stable position. Cardiomegaly. No pulmonary venous congestion. Low lung volumes with mild bibasilar atelectasis/infiltrates. Tiny left pleural effusion cannot be excluded. No pneumothorax. IMPRESSION: 1.  Lines and tubes in stable position. 2.  Stable cardiomegaly.  No pulmonary venous congestion. 3. Low lung volumes with mild bibasilar  atelectasis/infiltrates. Small left pleural effusion cannot be excluded. Similar findings noted on prior exam. Electronically Signed   By: Maisie Fus  Register   On: 03/28/2020 05:23   Portable Chest x-ray  Result Date: 03/27/2020 CLINICAL DATA:  Post intubation. EXAM: PORTABLE CHEST 1 VIEW COMPARISON:  Radiograph yesterday. FINDINGS: Endotracheal tube tip 5.6 cm in the carina. Right internal jugular central venous catheter tip projects over the upper SVC. Similar cardiomegaly. No pneumothorax. Low lung volumes. Minimal blunting of the costophrenic angles may be due to small effusions. Chronic interstitial coarsening. No confluent airspace disease. IMPRESSION: 1. Endotracheal tube tip 5.6 cm in the carina. 2. Right internal jugular central venous catheter tip projects over the upper SVC. No pneumothorax. 3. Low lung volumes. Similar cardiomegaly. Possible small pleural effusions. Electronically Signed   By: Narda Rutherford M.D.   On: 03/27/2020 21:16   ECHOCARDIOGRAM COMPLETE  Result Date: 03/28/2020    ECHOCARDIOGRAM REPORT   Patient Name:   Alison Russo Date of Exam: 03/28/2020 Medical Rec #:  [id number]        Height:       66.0 in Accession #:  [id number]       Weight:       198.0 lb Date of Birth:  12-Oct-1925        BSA:          1.991 m Patient Age:    86 years         BP:           99/76 mmHg Patient Gender: F                HR:           94 bpm. Exam Location:  [facility] Procedure: 2D Echo, Color Doppler and Cardiac Doppler Indications:     I48.91 Atrial fibrillation  History:         Patient has prior history of Echocardiogram examinations.                  Previous Myocardial Infarction and CAD; Risk Factors:Sleep                  Apnea and Hypertension. Recurrent pulmonary emboli.  Sonographer:     Humphrey RollsJoan Heiss RDCS (AE) Referring Phys:  [id number] Judithe ModestJEREMIAH D KEENE Diagnosing Phys: Arnoldo HookerBruce Theus Espin MD  Sonographer Comments: Echo performed with patient supine and on artificial respirator and Technically  difficult study due to poor echo windows. Image acquisition challenging due to patient body habitus. IMPRESSIONS  1. Left ventricular ejection fraction, by estimation, is 50 to 55%. The left ventricle has low normal function. The left ventricle has no regional wall motion abnormalities. Left ventricular diastolic function could not be evaluated.  2. Right ventricular systolic function is normal. The right ventricular size is normal. There is mildly elevated pulmonary artery systolic pressure.  3. Left atrial size was moderately dilated.  4. The mitral valve is normal in structure. Mild to moderate mitral valve regurgitation.  5. The aortic valve is normal in structure. Aortic valve regurgitation is trivial. FINDINGS  Left Ventricle: Left ventricular ejection fraction, by estimation, is 50 to 55%. The left ventricle has low normal function. The left ventricle has no regional wall motion abnormalities. The left ventricular internal cavity size was normal in size. There is no left ventricular hypertrophy. Left ventricular diastolic function could not be evaluated. Right Ventricle: The right ventricular size is normal. No increase in right ventricular wall thickness. Right ventricular systolic function is normal. There is mildly elevated pulmonary artery systolic pressure. The tricuspid regurgitant velocity is 2.55  m/s, and with an assumed right atrial pressure of 10 mmHg, the estimated right ventricular systolic pressure is 36.0 mmHg. Left Atrium: Left atrial size was moderately dilated. Right Atrium: Right atrial size was normal in size. Pericardium: There is no evidence of pericardial effusion. Mitral Valve: The mitral valve is normal in structure. Mild to moderate mitral valve regurgitation. MV peak gradient, 2.9 mmHg. The mean mitral valve gradient is 2.0 mmHg. Tricuspid Valve: The tricuspid valve is normal in structure. Tricuspid valve regurgitation is mild. Aortic Valve: The aortic valve is normal in structure.  Aortic valve regurgitation is trivial. Aortic valve mean gradient measures 7.0 mmHg. Aortic valve peak gradient measures 13.2 mmHg. Aortic valve area, by VTI measures 1.12 cm. Pulmonic Valve: The pulmonic valve was normal in structure. Pulmonic valve regurgitation is trivial. Aorta: The aortic root and ascending aorta are structurally normal, with no evidence of dilitation. IAS/Shunts: No atrial level shunt detected by color flow Doppler.  LEFT VENTRICLE PLAX 2D LVIDd:         3.43 cm  Diastology LVIDs:         2.80 cm  LV e' lateral:   8.59 cm/s LV PW:         0.97 cm  LV E/e' lateral: 9.5 LV IVS:        1.05 cm LVOT diam:     2.20 cm LV SV:         31 LV SV Index:   15 LVOT Area:     3.80 cm  RIGHT VENTRICLE RV Basal diam:  3.85 cm LEFT ATRIUM              Index       RIGHT ATRIUM           Index LA diam:        4.20 cm  2.11 cm/m  RA Area:     14.70 cm LA Vol (A2C):   44.0 ml  22.10 ml/m RA Volume:   34.40 ml  17.28 ml/m LA Vol (A4C):   103.0 ml 51.73 ml/m LA Biplane Vol: 70.8 ml  35.56 ml/m  AORTIC VALVE                    PULMONIC VALVE AV Area (Vmax):    1.17 cm     PV Vmax:       0.97 m/s AV Area (Vmean):   1.09 cm     PV Vmean:      68.400 cm/s AV Area (VTI):     1.12 cm     PV VTI:        0.150 m AV Vmax:           182.00 cm/s  PV Peak grad:  3.7 mmHg AV Vmean:          126.000 cm/s PV Mean grad:  2.0 mmHg AV VTI:            0.275 m AV Peak Grad:      13.2 mmHg AV Mean Grad:      7.0 mmHg LVOT Vmax:         56.10 cm/s LVOT Vmean:        36.200 cm/s LVOT VTI:          0.081 m LVOT/AV VTI ratio: 0.29  AORTA Ao Root diam: 3.40 cm MITRAL VALVE               TRICUSPID VALVE MV Area (PHT): 3.28 cm    TR Peak grad:   26.0 mmHg MV Peak grad:  2.9 mmHg    TR Vmax:        255.00 cm/s MV Mean grad:  2.0 mmHg MV Vmax:       0.85 m/s    SHUNTS MV Vmean:      62.2 cm/s   Systemic VTI:  0.08 m MV Decel Time: 231 msec    Systemic Diam: 2.20 cm MV E velocity: 81.45 cm/s Arnoldo Hooker MD Electronically signed by  Arnoldo Hooker MD Signature Date/Time: 03/28/2020/7:02:42 PM    Final    DG HIP OPERATIVE UNILAT W OR W/O PELVIS RIGHT  Result Date: 03/27/2020 CLINICAL DATA:  86 year old female with right femoral ORIF. EXAM: OPERATIVE right HIP (WITH PELVIS IF PERFORMED) 2 VIEWS TECHNIQUE: Fluoroscopic spot image(s) were submitted for interpretation post-operatively. COMPARISON:  None. FINDINGS: Eight intraoperative fluoroscopic spot images provided. The total fluoro time is 2 minutes 7 seconds and total air kerma of 20.6 mGy. Right femoral nail noted which appears intact. IMPRESSION: Status post  right femoral nail. Electronically Signed   By: Elgie Collard M.D.   On: 03/27/2020 18:28   DG FEMUR, MIN 2 VIEWS RIGHT  Result Date: 03/27/2020 CLINICAL DATA:  Postop femur fracture fixation. EXAM: RIGHT FEMUR 2 VIEWS COMPARISON:  Radiograph yesterday. FINDINGS: Intramedullary nail with trans trochanteric and distal locking screw with proximal cerclage wire fixation of proximal femur fracture. Fracture is in improved alignment compared to preoperative imaging. Recent postsurgical change includes air and edema in the soft tissues as well as skin staples. There are vascular calcifications. IMPRESSION: Post ORIF mid-proximal femur fracture without immediate postoperative complication. Electronically Signed   By: Narda Rutherford M.D.   On: 03/27/2020 21:17   DG Femur Min 2 Views Right  Result Date: 03/26/2020 CLINICAL DATA:  86 year old female with fall and right hip pain. EXAM: RIGHT FEMUR 2 VIEWS; PELVIS - 1-2 VIEW COMPARISON:  CT abdomen pelvis dated 01/19/2018. FINDINGS: There is a displaced oblique or spiral fracture of the proximal right femur extending from the femoral neck into the proximal femoral diaphysis. There is full shaft width medial displacement of the distal fracture fragment. There is no dislocation. The bones are osteopenic. Vascular calcification as well as calcified fibroid over the left pelvis. The soft  tissues are grossly unremarkable. IMPRESSION: Displaced oblique or spiral fracture of the proximal right femur. Electronically Signed   By: Elgie Collard M.D.   On: 03/26/2020 16:28     Assessment and Recommendation  86 y.o. female with known paroxysmal nonvalvular atrial fibrillation with controlled ventricular rate coronary atherosclerosis status post previous stents hypertension hyperlipidemia with fall and right leg fracture now status post orthopedic surgery hypotension and respiratory difficulty slowly improving without evidence of congestive heart failure or myocardial infarction 1. Continue supportive care status post surgery from pulmonary toilet ambulation and rehabilitation without restriction 2.  Addition of metoprolol 25 mg twice per day for better heart rate control below 100 bpm 3. No further cardiac diagnostics necessary at this time 4. Okay for reinstatement of anticoagulation for further risk reduction in stroke with atrial fibrillation soon as orthopedic surgery is okay at 5 mg of Eliquis twice per day 4.  Okay for beginning rehab at this time  Signed, Arnoldo Hooker M.D. FACC

## 2020-03-30 NOTE — Consult Note (Signed)
PHARMACY CONSULT NOTE  Pharmacy Consult for Electrolyte Monitoring and Replacement   Recent Labs: Potassium (mmol/L)  Date Value  03/30/2020 4.0   Magnesium (mg/dL)  Date Value  85/88/5027 2.1   Calcium (mg/dL)  Date Value  74/08/8785 7.9 (L)   Albumin (g/dL)  Date Value  76/72/0947 2.3 (L)  09/28/2017 3.9   Phosphorus (mg/dL)  Date Value  09/62/8366 2.0 (L)   Sodium (mmol/L)  Date Value  03/30/2020 139  09/28/2017 143   Corrected Ca: 9.26 mg/dL  Assessment: 86 y.o. Female w/ PMH CAD, NSTEMI s/p stents, HTN admitted 03/26/20 with Right displaced Femoral Shaft Fracture, s/p Intramedullary Fixation of the right femoral artery shaft fracture on 03/27/20.  MIVF: lactated ringers at 75 mL/hr  Goal of Therapy:  Potassium 4.0 - 5.1 mmol/L Magnesium 2.0 - 2.4 mg/dL All Other Electrolytes WNL  Plan:   K-Phos neutral 500 mg po x 1  F/U electrolytes in am 7/12  Lowella Bandy ,PharmD Clinical Pharmacist 03/30/2020 9:50 AM

## 2020-03-30 NOTE — Progress Notes (Signed)
Had a great day, in very good spirits and very willing to work with [place]. She is doing suggested exercises.

## 2020-03-31 DIAGNOSIS — S728X1A Other fracture of right femur, initial encounter for closed fracture: Secondary | ICD-10-CM

## 2020-03-31 DIAGNOSIS — J9601 Acute respiratory failure with hypoxia: Secondary | ICD-10-CM

## 2020-03-31 DIAGNOSIS — S7291XA Unspecified fracture of right femur, initial encounter for closed fracture: Secondary | ICD-10-CM

## 2020-03-31 DIAGNOSIS — D62 Acute posthemorrhagic anemia: Secondary | ICD-10-CM

## 2020-03-31 LAB — PREPARE RBC (CROSSMATCH)

## 2020-03-31 LAB — CBC
HCT: 22.6 % — ABNORMAL LOW (ref 36.0–46.0)
Hemoglobin: 7.8 g/dL — ABNORMAL LOW (ref 12.0–15.0)
MCH: 31.6 pg (ref 26.0–34.0)
MCHC: 34.5 g/dL (ref 30.0–36.0)
MCV: 91.5 fL (ref 80.0–100.0)
Platelets: 93 10*3/uL — ABNORMAL LOW (ref 150–400)
RBC: 2.47 MIL/uL — ABNORMAL LOW (ref 3.87–5.11)
RDW: 15.4 % (ref 11.5–15.5)
WBC: 12.4 10*3/uL — ABNORMAL HIGH (ref 4.0–10.5)
nRBC: 0.5 % — ABNORMAL HIGH (ref 0.0–0.2)

## 2020-03-31 LAB — GLUCOSE, CAPILLARY
Glucose-Capillary: 148 mg/dL — ABNORMAL HIGH (ref 70–99)
Glucose-Capillary: 151 mg/dL — ABNORMAL HIGH (ref 70–99)
Glucose-Capillary: 134 mg/dL — ABNORMAL HIGH (ref 70–99)
Glucose-Capillary: 246 mg/dL — ABNORMAL HIGH (ref 70–99)
Glucose-Capillary: 123 mg/dL — ABNORMAL HIGH (ref 70–99)

## 2020-03-31 LAB — MAGNESIUM: Magnesium: 2.2 mg/dL (ref 1.7–2.4)

## 2020-03-31 MED ORDER — APIXABAN 5 MG PO TABS
5.0000 mg | ORAL_TABLET | Freq: Two times a day (BID) | ORAL | Status: DC
  Administered 2020-03-31 – 2020-04-02 (×4): 5 mg via ORAL
  Filled 2020-03-31 (×4): qty 1

## 2020-03-31 MED ORDER — ACETAMINOPHEN 325 MG PO TABS
650.0000 mg | ORAL_TABLET | Freq: Four times a day (QID) | ORAL | Status: DC | PRN
  Administered 2020-03-31 – 2020-04-01 (×4): 650 mg via ORAL
  Filled 2020-03-31 (×4): qty 2

## 2020-03-31 MED ORDER — DOCUSATE SODIUM 100 MG PO CAPS
100.0000 mg | ORAL_CAPSULE | Freq: Two times a day (BID) | ORAL | Status: DC
  Administered 2020-03-31 – 2020-04-02 (×5): 100 mg via ORAL
  Filled 2020-03-31 (×5): qty 1

## 2020-03-31 MED ORDER — APIXABAN 5 MG PO TABS: 5.0000 mg | ORAL_TABLET | Freq: Two times a day (BID) | ORAL | Status: DC

## 2020-03-31 NOTE — Evaluation (Signed)
Physical Therapy Evaluation Patient Details Name: Alison Russo MRN: [id number] DOB: 1926-07-27 Today's Date: 03/31/2020   History of Present Illness  Pt is a 86 y.o. female presenting to hospital 7/7 s/p mechanical fall in driveway sustaining R hip pain and mild pain in R elbow.  Imaging showing displaced oblique or spiral fx of proximal R femur.  Pt s/p IM fixation of R femoral shaft fx with cerclage cable 7/8; intraoperatively she was noted to have persistent venous oozing from the vastus lateralis and fracture site, with approximately 1200 cc of blood loss;  2 units PRBC's intraoperatively and also received PRBC's post op; transferred to CCU post op intubated for cardiac monitoring and with post op respiratory failure; and upon arrival to ICU noted to be in severe shock and a-fib with RVR; extubated 7/9.  PMH includes anginal pain, CAD, heart murmur, htn, mood disorder, NSTEMI, OSA on CPAP, PE, lymphedema, a-fib, and cardiac cath.  Clinical Impression  Prior to hospital admission, pt was ambulatory; lives alone in 1 level home with 1 step to enter.  Currently pt is 1-2 assist with bed mobility; fair sitting balance with UE support (L lean d/t R hip pain); and unable to stand with 2 assist and walker use.  Pt appearing SOB intermittently requiring pacing during session (O2 sats 93% or greater on room air; HR in a-fib fluctuating between 70-90 bpm).  Pt would benefit from skilled PT to address noted impairments and functional limitations (see below for any additional details).  Upon hospital discharge, pt would benefit from STR.    Follow Up Recommendations SNF    Equipment Recommendations  Rolling walker with 5" wheels;3in1 (PT);Wheelchair (measurements PT);Wheelchair cushion (measurements PT)    Recommendations for Other Services OT consult     Precautions / Restrictions Precautions Precautions: Fall Restrictions Weight Bearing Restrictions: Yes RLE Weight Bearing: Weight bearing as  tolerated Other Position/Activity Restrictions: WBAT R LE per ortho note 7/9      Mobility  Bed Mobility Overal bed mobility: Needs Assistance Bed Mobility: Supine to Sit;Sit to Supine     Supine to sit: Mod assist;Max assist;HOB elevated Sit to supine: Mod assist;Max assist;+2 for physical assistance;HOB elevated   General bed mobility comments: assist for trunk and R>L LE semi-supine to sitting edge of bed; vc's for technique; increased time/effort to perform; 2 assist sit to semi-supine in bed and to reposition/boost up in bed using bed sheets  Transfers Overall transfer level: Needs assistance Equipment used: Rolling walker (2 wheeled) Transfers: Sit to/from Stand Sit to Stand: +2 physical assistance;Total assist         General transfer comment: pt with minimal initiation to stand with 2 assist limiting ability to stand (pt's bottom did not come off of sitting surface); vc's for UE/LE placement and overall technique  Ambulation/Gait             General Gait Details: not appropriate at this time  Stairs            Wheelchair Mobility    Modified Rankin (Stroke Patients Only)       Balance Overall balance assessment: Needs assistance Sitting-balance support: Bilateral upper extremity supported;Feet supported Sitting balance-Leahy Scale: Fair Sitting balance - Comments: steady static sitting but pt leaning more towards L d/t R hip pain/discomfort during session Postural control: Left lateral lean  Pertinent Vitals/Pain Pain Assessment: Faces Faces Pain Scale: Hurts little more Pain Location: stomach/back/down leg to knee on R side Pain Descriptors / Indicators: Aching;Sore;Tender Pain Intervention(s): Limited activity within patient's tolerance;Monitored during session;Repositioned;Premedicated before session     Home Living Family/patient expects to be discharged to:: Private residence Living  Arrangements: Alone Available Help at Discharge: Family;Available PRN/intermittently Type of Home: House Home Access: Stairs to enter Entrance Stairs-Rails: None Entrance Stairs-Number of Steps: 1 Home Layout: One level Home Equipment: Cane - quad;Bedside commode;Shower seat      Prior Function Level of Independence: Independent         Comments: (+) driving     Hand Dominance        Extremity/Trunk Assessment   Upper Extremity Assessment Upper Extremity Assessment: Generalized weakness    Lower Extremity Assessment Lower Extremity Assessment: RLE deficits/detail (generalized weakness L LE) RLE Deficits / Details: fair R quad set strength (isometric contraction); at least 3/5 ankle DF/PF AROM; hip flexion at least 2+/5 RLE: Unable to fully assess due to pain    Cervical / Trunk Assessment Cervical / Trunk Assessment: Normal  Communication   Communication: No difficulties  Cognition Arousal/Alertness: Awake/alert Behavior During Therapy: Anxious Overall Cognitive Status: Within Functional Limits for tasks assessed                                        General Comments General comments (skin integrity, edema, etc.): mild drainage noted R lateral hip end of session--nurse notified.  Nursing cleared pt for participation in physical therapy.  Pt agreeable to PT session.    Exercises Total Joint Exercises Ankle Circles/Pumps: AROM;Strengthening;Both;10 reps;Supine Quad Sets: AROM;Strengthening;Both;10 reps;Supine Heel Slides: AAROM;Strengthening;Right;10 reps;Supine Hip ABduction/ADduction: AAROM;Strengthening;Right;10 reps;Supine   Assessment/Plan    PT Assessment Patient needs continued PT services  PT Problem List Decreased strength;Decreased range of motion;Decreased activity tolerance;Decreased balance;Decreased mobility;Decreased knowledge of use of DME;Decreased knowledge of precautions;Cardiopulmonary status limiting activity;Pain;Decreased  skin integrity       PT Treatment Interventions DME instruction;Gait training;Stair training;Functional mobility training;Therapeutic activities;Therapeutic exercise;Balance training;Patient/family education    PT Goals (Current goals can be found in the Care Plan section)  Acute Rehab PT Goals Patient Stated Goal: to go home PT Goal Formulation: With patient Time For Goal Achievement: 04/14/20 Potential to Achieve Goals: Fair    Frequency 7X/week   Barriers to discharge Decreased caregiver support      Co-evaluation               AM-PAC PT "6 Clicks" Mobility  Outcome Measure Help needed turning from your back to your side while in a flat bed without using bedrails?: A Little Help needed moving from lying on your back to sitting on the side of a flat bed without using bedrails?: A Lot Help needed moving to and from a bed to a chair (including a wheelchair)?: Total Help needed standing up from a chair using your arms (e.g., wheelchair or bedside chair)?: Total Help needed to walk in hospital room?: Total Help needed climbing 3-5 steps with a railing? : Total 6 Click Score: 9    End of Session Equipment Utilized During Treatment: Gait belt Activity Tolerance: Patient limited by fatigue Patient left: in bed;Other (comment) (with 2 NT's present to give pt a bath) Nurse Communication: Mobility status;Precautions;Weight bearing status PT Visit Diagnosis: Other abnormalities of gait and mobility (R26.89);Muscle weakness (generalized) (M62.81);History of falling (Z91.81);Difficulty  in walking, not elsewhere classified (R26.2);Pain Pain - Right/Left: Right Pain - part of body: Hip    Time: 8032-1224 PT Time Calculation (min) (ACUTE ONLY): 35 min   Charges:   PT Evaluation $PT Eval Low Complexity: 1 Low PT Treatments $Therapeutic Exercise: 8-22 mins       Hendricks Limes, PT 03/31/20, 12:19 PM

## 2020-03-31 NOTE — [place] FL2 (Signed)
[place] MEDICAID FL2 LEVEL OF CARE SCREENING TOOL     IDENTIFICATION  Patient Name: Alison Russo Birthdate: [birth year] Sex: female Admission Date (Current Location): 03/26/2020  [place] and [place] Number:  Chiropodist and Address:  [facility], [address] [place], [place] [address]      Provider Number: [id number]  Attending Physician Name and Address:  Enedina Finner, MD  Relative Name and Phone Number:  Marc Morgans [phone]    Current Level of Care: Hospital Recommended Level of Care: Skilled Nursing Facility Prior Approval Number:    Date Approved/Denied:   PASRR Number: [id number]  Discharge Plan: SNF    Current Diagnoses: Patient Active Problem List   Diagnosis Date Noted  . Closed displaced fracture of proximal epiphysis of right femur (HCC) 03/26/2020  . Sleep apnea 11/29/2018  . Gastroesophageal reflux disease without esophagitis 02/20/2018  . Mood disorder (HCC)   . Hematoma 02/01/2018  . Lymphedema 02/01/2018  . Leg pain 01/09/2018  . Atrial fibrillation (HCC) 12/28/2017  . Heart murmur 12/28/2017  . Tachycardia 12/28/2017  . CAD (coronary artery disease) 12/25/2017  . Essential hypertension 12/25/2017  . Hyperlipidemia 12/25/2017  . History of pulmonary embolism 12/25/2017  . Anemia 12/25/2017  . Chronic anticoagulation 12/22/2017  . NSTEMI (non-ST elevated myocardial infarction) (HCC) 12/21/2017  . Contusion of right knee 02/27/2016  . 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    Orientation RESPIRATION BLADDER Height & Weight     Self, Time, Situation, Place  Normal External catheter Weight: 89.8 kg Height:  5\' 6"  (167.6 cm)  BEHAVIORAL SYMPTOMS/MOOD NEUROLOGICAL BOWEL NUTRITION STATUS      Continent Diet  AMBULATORY STATUS COMMUNICATION OF NEEDS Skin   Extensive Assist Verbally Surgical wounds                       Personal Care Assistance Level of Assistance  Bathing, Dressing  Bathing Assistance: Limited assistance   Dressing Assistance: Limited assistance     Functional Limitations Info             SPECIAL CARE FACTORS FREQUENCY  PT (By licensed PT), OT (By licensed OT)     PT Frequency: 5x week              Contractures Contractures Info: Not present    Additional Factors Info  Code Status, Allergies Code Status Info: Full Allergies Info: NKDA           Current Medications (03/31/2020):  This is the current hospital active medication list Current Facility-Administered Medications  Medication Dose Route Frequency Provider Last Rate Last Admin  . 0.9 %  sodium chloride infusion   Intravenous PRN 06/01/2020, MD   Paused at 03/29/20 05/30/20  . acetaminophen (TYLENOL) tablet 650 mg  650 mg Oral Q6H PRN 5284, MD   650 mg at 03/31/20 0934  . albumin human 25 % solution 12.5 g  12.5 g Intravenous Daily 06/01/20, MD 60 mL/hr at 03/31/20 0859 12.5 g at 03/31/20 0859  . alum & mag hydroxide-simeth (MAALOX/MYLANTA) 200-200-20 MG/5ML suspension 15 mL  15 mL Oral Q6H PRN 03-29-2001, MD      . apixaban Darlin Priestly) tablet 5 mg  5 mg Oral BID Everlene Balls, [facility]      . atorvastatin (LIPITOR) tablet 80 mg  80 mg Oral q1800 [facility], MD   80 mg at 03/30/20 1720  . calcium carbonate (TUMS - dosed in mg elemental calcium)  chewable tablet 200 mg of elemental calcium  1 tablet Oral TID PRN Darlin Priestly, MD      . Chlorhexidine Gluconate Cloth 2 % PADS 6 each  6 each Topical Daily Judithe Modest, NP   6 each at 03/31/20 1212  . digoxin (LANOXIN) 0.25 MG/ML injection 0.125 mg  0.125 mg Intravenous Daily Vida Rigger, MD   0.125 mg at 03/31/20 0844  . docusate sodium (COLACE) capsule 100 mg  100 mg Oral BID PRN Darlin Priestly, MD      . docusate sodium (COLACE) capsule 100 mg  100 mg Oral BID Ronnald Ramp, RPH   100 mg at 03/31/20 1212  . famotidine (PEPCID) tablet 20 mg  20 mg Oral BID Deeann Saint, MD   20 mg at 03/31/20 0842  .  guaiFENesin-dextromethorphan (ROBITUSSIN DM) 100-10 MG/5ML syrup 10 mL  10 mL Oral Q6H PRN Darlin Priestly, MD      . insulin aspart (novoLOG) injection 0-15 Units  0-15 Units Subcutaneous Q4H Harlon Ditty D, NP   2 Units at 03/31/20 1212  . ipratropium-albuterol (DUONEB) 0.5-2.5 (3) MG/3ML nebulizer solution 3 mL  3 mL Nebulization Q4H PRN Harlon Ditty D, NP      . methocarbamol (ROBAXIN) 500 mg in dextrose 5 % 50 mL IVPB  500 mg Intravenous Q8H PRN Juanell Fairly, MD   Stopped at 03/27/20 1322  . metoprolol tartrate (LOPRESSOR) tablet 25 mg  25 mg Oral BID Lamar Blinks, MD   25 mg at 03/31/20 0842  . mupirocin ointment (BACTROBAN) 2 % 1 application  1 application Nasal BID Vida Rigger, MD   1 application at 03/31/20 1213  . ondansetron (ZOFRAN) injection 4 mg  4 mg Intravenous Q6H PRN Darlin Priestly, MD      . ondansetron (ZOFRAN-ODT) disintegrating tablet 4 mg  4 mg Oral Q8H PRN Darlin Priestly, MD      . polyethylene glycol (MIRALAX / GLYCOLAX) packet 17 g  17 g Oral BID PRN Darlin Priestly, MD      . polyethylene glycol (MIRALAX / GLYCOLAX) packet 17 g  17 g Oral Daily Harlon Ditty D, NP   17 g at 03/31/20 0843  . sertraline (ZOLOFT) tablet 100 mg  100 mg Oral Daily Darlin Priestly, MD   100 mg at 03/31/20 0842  . sodium chloride flush (NS) 0.9 % injection 10-40 mL  10-40 mL Intracatheter Q12H Harlon Ditty D, NP   10 mL at 03/31/20 0936  . sodium chloride flush (NS) 0.9 % injection 10-40 mL  10-40 mL Intracatheter PRN Harlon Ditty D, NP      . traZODone (DESYREL) tablet 25 mg  25 mg Oral QHS PRN Darlin Priestly, MD   25 mg at 03/29/20 2028     Discharge Medications: Please see discharge summary for a list of discharge medications.  Relevant Imaging Results:  Relevant Lab Results:   Additional Information SSN: [id number]  Trenton Founds, RN

## 2020-03-31 NOTE — Care Management Important Message (Signed)
Important Message  Patient Details  Name: Alison Russo MRN: [id number] Date of Birth: 1925/12/03   Medicare Important Message Given:  Yes     [name] 03/31/2020, 12:02 PM

## 2020-03-31 NOTE — Progress Notes (Signed)
[facility]  - [place] [facility]   PATIENT NAME: Alison Russo    MR#:  [id number]  DATE OF BIRTH:  Jun 15, 1926  SUBJECTIVE:  patient overall doing better. Feels overall weak. Eat some breakfast. Daughter in the room. Participated in physical therapy. Got tired thereafter. Complains of surgical hip pain. No issues with bleeding at present. No fever  REVIEW OF SYSTEMS:   Review of Systems  Constitutional: Positive for malaise/fatigue. Negative for chills, fever and weight loss.  HENT: Negative for ear discharge, ear pain and nosebleeds.   Eyes: Negative for blurred vision, pain and discharge.  Respiratory: Negative for sputum production, shortness of breath, wheezing and stridor.   Cardiovascular: Negative for chest pain, palpitations, orthopnea and PND.  Gastrointestinal: Negative for abdominal pain, diarrhea, nausea and vomiting.  Genitourinary: Negative for frequency and urgency.  Musculoskeletal: Negative for back pain and joint pain.  Neurological: Positive for weakness. Negative for sensory change, speech change and focal weakness.  Psychiatric/Behavioral: Negative for depression and hallucinations. The patient is not nervous/anxious.    Tolerating Diet:yes Tolerating PT: recommends rehab  DRUG ALLERGIES:  No Known Allergies  VITALS:  Blood pressure (!) 122/95, pulse 68, temperature 98.4 F (36.9 C), temperature source Oral, resp. rate 18, height 5\' 6"  (1.676 m), weight 89.8 kg, SpO2 96 %.  PHYSICAL EXAMINATION:   Physical Exam  GENERAL:  86 y.o.-year-old patient lying in the bed with no acute distress.pallor+ weak  EYES: Pupils equal, round, reactive to light and accommodation. No scleral icterus.   HEENT: Head atraumatic, normocephalic. Oropharynx and nasopharynx clear.  NECK:  Supple, no jugular venous distention. No thyroid enlargement, no tenderness.  LUNGS: Normal breath sounds bilaterally, no wheezing, rales, rhonchi. No use of accessory  muscles of respiration.  CARDIOVASCULAR: S1, S2 normal. No murmurs, rubs, or gallops.  ABDOMEN: Soft, nontender, nondistended. Bowel sounds present. No organomegaly or mass.  EXTREMITIES: surgical dressing + right hip NEUROLOGIC: Cranial nerves II through XII are intact. No focal Motor or sensory deficits b/l.  weak PSYCHIATRIC:  patient is alert and oriented x 3.  SKIN: No obvious rash, lesion, or ulcer.  .pres  LABORATORY PANEL:  CBC Recent Labs  Lab 03/31/20 0450  WBC 12.4*  HGB 7.8*  HCT 22.6*  PLT 93*    Chemistries  Recent Labs  Lab 03/27/20 2122 03/28/20 0040 03/30/20 0535 03/30/20 1118 03/31/20 0450  NA 138   < >  --  143  --   K 4.5   < >  --  4.1  --   CL 108   < >  --  110  --   CO2 26   < >  --  25  --   GLUCOSE 189*   < >  --  143*  --   BUN 24*   < >  --  29*  --   CREATININE 0.82   < >  --  0.74  --   CALCIUM 7.5*   < >  --  8.0*  --   MG  --    < >   < >  --  2.2  AST 30  --   --   --   --   ALT 14  --   --   --   --   ALKPHOS 34*  --   --   --   --   BILITOT 1.4*  --   --   --   --    < > =  values in this interval not displayed.   Cardiac Enzymes No results for input(s): TROPONINI in the last 168 hours. RADIOLOGY:  No results found. ASSESSMENT AND PLAN:  86 y.o. Female admitted 03/26/20 with Right displaced Femoral Shaft Fracture. She underwent Intramedullary Fixation of the right femoral artery shaft fracture on 03/27/20. Intraoperatively she was noted to have persistent venous oozing from the vastus lateralis and fracture site, with approximately 1200 cc of blood loss. She was given 2 units pRBC's intraoperatively.She returns to ICU post procedure and remains intubated. Upon arrival to ICU noted to have severe shock and atrial fibrillation with RVR.  Acute blood loss anemia -due to hip fracture with large hematoma that was evacuated -patient received couple units of blood transfusion -hemoglobin now stable at 7.1 -discussed with Dr. Hyacinth Meeker okay  to resume eliquis  Postoperative respiratory failure -suspected due to profound circulatory shock with a fib RVR and acute blood loss anemia -patient required mechanical ventilation postop and multiple visa pressers now improved -extubated 7/9/ 21 -on room air  Displaced fracture of the proximal right femur status post intramedullary fixation on  7/8/ 21 -Dr. Hyacinth Meeker okay to start eliquis -continue physical therapy. -TOC for discharge planning to rehab -no indication for IV antibiotics -will resume home meds  Postop a fib with RVR -required amiodarone -heart rate much improved -continue metoprolol, resume eliquis -followed by Dr. Gwen Pounds  History of CAD -patient's Plavix is on hold given bleeding and now being started on eliquis due to new onset a fib -will defer to cardiology to work on blood thinners as outpatient  Hypertension continue beta-blockers  Physical therapy recommends rehab [facility] for discharge planning okay from cardiology and ortho standpoint for discharge. Discussed with daughter and patient for rehab once bed available    Procedures: right hip fracture surgery Family communication : daughter in the room Consults : ortho, cardiology, ICU CODE STATUS: full DVT Prophylaxis : eliquis  Status is: Inpatient  Remains inpatient appropriate because:Unsafe d/c plan   Dispo: The patient is from: Home              Anticipated d/c is to: SNF              Anticipated d/c date is: 2 days              Patient currently is medically stable to d/c. waiting for rehab bed availability.        TOTAL TIME TAKING CARE OF THIS PATIENT: *25* minutes.  >50% time spent on counselling and coordination of care  Note: This dictation was prepared with Dragon dictation along with smaller phrase technology. Any transcriptional errors that result from this process are unintentional.  Enedina Finner M.D    [facility]   CC: Primary care physician; Gracelyn Nurse,  MDPatient ID: [name], female   DOB: 1926/07/18, 86 y.o.   MRN: [id number]

## 2020-03-31 NOTE — Consult Note (Signed)
PHARMACY CONSULT NOTE  Pharmacy Consult for Electrolyte Monitoring and Replacement   Recent Labs: Potassium (mmol/L)  Date Value  03/30/2020 4.1   Magnesium (mg/dL)  Date Value  73/56/7014 2.2   Calcium (mg/dL)  Date Value  07/19/1313 8.0 (L)   Albumin (g/dL)  Date Value  38/88/7579 2.9 (L)  09/28/2017 3.9   Phosphorus (mg/dL)  Date Value  72/82/0601 1.9 (L)   Sodium (mmol/L)  Date Value  03/30/2020 143  09/28/2017 143   Corrected Ca: 9.26 mg/dL  Assessment: 86 y.o. Female w/ PMH CAD, NSTEMI s/p stents, HTN admitted 03/26/20 with Right displaced Femoral Shaft Fracture, s/p Intramedullary Fixation of the right femoral artery shaft fracture on 03/27/20.  MIVF: lactated ringers at 75 mL/hr  Goal of Therapy:  Potassium 4.0 - 5.1 mmol/L Magnesium 2.0 - 2.4 mg/dL All Other Electrolytes WNL  Plan:  No new labs. No replacement at this time. Will f/u with electrolytes with AM labs.     Ronnald Ramp ,PharmD Clinical Pharmacist 03/31/2020 7:43 AM

## 2020-03-31 NOTE — Plan of Care (Addendum)
Rounded with MD.Foley catheter removed per MD order, pt tolerated well, honey comb dressing changed to right hip. Pt resting in room. Problem: Health Behavior/Discharge Planning: Goal: Ability to manage health-related needs will improve Outcome: Progressing   Problem: Clinical Measurements: Goal: Ability to maintain clinical measurements within normal limits will improve Outcome: Progressing Goal: Diagnostic test results will improve Outcome: Progressing Goal: Respiratory complications will improve Outcome: Progressing   Problem: Health Behavior/Discharge Planning: Goal: Ability to manage health-related needs will improve Outcome: Progressing   Problem: Clinical Measurements: Goal: Ability to maintain clinical measurements within normal limits will improve Outcome: Progressing Goal: Diagnostic test results will improve Outcome: Progressing Goal: Respiratory complications will improve Outcome: Progressing

## 2020-03-31 NOTE — TOC Initial Note (Signed)
Transition of Care [facility]) - Initial/Assessment Note    Patient Details  Name: Alison Russo MRN: [id number] Date of Birth: [birth year]  Transition of Care [facility]) CM/SW Contact:    Shelbie Ammons, RN Phone Number: 03/31/2020, 2:13 PM  Clinical Narrative:     RNCM met with patient and daughter at bedside. Patient had recent IM nailing for right femur fracture. Patient normally lives at home independently. She reports that she has been to [facility] before for rehab after heart surgery and understands she may need to go this time at least for a short time. Patient reports that she would be agreeable to her information being sent out to all the area facilities but her top choices are [facility] and [facility]. RNCM completed FL-2 and verified PASSR. Bed search initiated. RNCM will follow.                Expected Discharge Plan: Skilled Nursing Facility Barriers to Discharge: No Barriers Identified   Patient Goals and CMS Choice        Expected Discharge Plan and Services Expected Discharge Plan: [place]   Discharge Planning Services: CM Consult   Living arrangements for the past 2 months: Single Family Home                                      Prior Living Arrangements/Services Living arrangements for the past 2 months: Single Family Home Lives with:: Self   Do you feel safe going back to the place where you live?: Yes               Activities of Daily Living Home Assistive Devices/Equipment: CPAP ADL Screening (condition at time of admission) Patient's cognitive ability adequate to safely complete daily activities?: Yes Is the patient deaf or have difficulty hearing?: Yes (bilateral) Does the patient have difficulty seeing, even when wearing glasses/contacts?: No Does the patient have difficulty concentrating, remembering, or making decisions?: No Patient able to express need for assistance with ADLs?: Yes Does the patient have difficulty dressing or  bathing?: Yes Independently performs ADLs?: No Communication: Independent Dressing (OT): Needs assistance Is this a change from baseline?: Change from baseline, expected to last <3days Grooming: Needs assistance Is this a change from baseline?: Change from baseline, expected to last <3 days Feeding: Independent Bathing: Needs assistance Is this a change from baseline?: Change from baseline, expected to last <3 days Toileting: Needs assistance Is this a change from baseline?: Change from baseline, expected to last <3 days In/Out Bed: Needs assistance Is this a change from baseline?: Change from baseline, expected to last <3 days Walks in Home: Needs assistance Is this a change from baseline?: Change from baseline, expected to last <3 days Does the patient have difficulty walking or climbing stairs?: Yes Weakness of Legs: Right Weakness of Arms/Hands: None  Permission Sought/Granted                  Emotional Assessment Appearance:: Appears stated age Attitude/Demeanor/Rapport: Engaged Affect (typically observed): Appropriate Orientation: : Oriented to Self, Oriented to Place, Oriented to  Time, Oriented to Situation Alcohol / Substance Use: Not Applicable Psych Involvement: No (comment)  Admission diagnosis:  [place] [W19.XXXA] Pre-op chest exam [H70.263] Closed displaced fracture of proximal epiphysis of right femur ([place]) [S72.021A] Other closed fracture of right femur, unspecified portion of femur, initial encounter [facility]) [Z85.8I5O] Patient Active Problem List   Diagnosis  Date Noted  . Closed displaced fracture of proximal epiphysis of right femur ([place]) 03/26/2020  . Sleep apnea 11/29/2018  . Gastroesophageal reflux disease without esophagitis 02/20/2018  . Mood disorder ([place])   . Hematoma 02/01/2018  . Lymphedema 02/01/2018  . Leg pain 01/09/2018  . Atrial fibrillation ([place]) 12/28/2017  . Heart murmur 12/28/2017  . Tachycardia 12/28/2017  . CAD (coronary artery  disease) 12/25/2017  . Essential hypertension 12/25/2017  . Hyperlipidemia 12/25/2017  . History of pulmonary embolism 12/25/2017  . Anemia 12/25/2017  . Chronic anticoagulation 12/22/2017  . NSTEMI (non-ST elevated myocardial infarction) ([place]) 12/21/2017  . Contusion of right knee 02/27/2016  . 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 [place] [address] Phone: [phone] Fax: [phone]  [id number] [place], [id number]: [phone]: [phone]    Social Determinants of Health (SDOH) Interventions    Readmission Risk Interventions No flowsheet data found.

## 2020-03-31 NOTE — Progress Notes (Signed)
[facility] Encounter Note  Patient: Alison Russo / Admit Date: 03/26/2020 / Date of Encounter: 03/31/2020, 12:58 PM   Subjective: Patient appears to be tolerating transfer to telemetry well. She is getting inhalers and pulmonary toilet at this time. Blood pressure slightly improved .  Better heart rate control today with metoprolol dose.  Currently there is no evidence of chest discomfort congestive heart failure or acute coronary syndrome based on issues. Patient has tolerated heparin for further risk reduction in stroke with continued atrial fibrillation and will consider change to oral anticoagulation  Review of Systems: Positive for: Shortness of breath weakness Negative for: Vision change, hearing change, syncope, dizziness, nausea, vomiting,diarrhea, bloody stool, stomach pain, cough, congestion, diaphoresis, urinary frequency, urinary pain,skin lesions, skin rashes Others previously listed  Objective: Telemetry: Atrial fibrillation with controlled ventricular rate Physical Exam: Blood pressure (!) 122/95, pulse 68, temperature 98.4 F (36.9 C), temperature source Oral, resp. rate 18, height 5\' 6"  (1.676 m), weight 89.8 kg, SpO2 96 %. Body mass index is 31.95 kg/m. General: Well developed, well nourished, in no acute distress. Head: Normocephalic, atraumatic, sclera non-icteric, no xanthomas, nares are without discharge. Neck: No apparent masses Lungs: Normal respirations with few wheezes, some rhonchi, no rales , no crackles   Heart: Irregular rate and rhythm, normal S1 S2, no murmur, no rub, no gallop, PMI is normal size and placement, carotid upstroke normal without bruit, jugular venous pressure normal Abdomen: Soft, non-tender, non-distended with normoactive bowel sounds. No hepatosplenomegaly. Abdominal aorta is normal size without bruit Extremities: Trace to 1+ edema, no clubbing, no cyanosis, no ulcers,  Peripheral: 2+ radial, 2+ femoral, 2+ dorsal  pedal pulses Neuro: Alert and oriented. Moves all extremities spontaneously. Psych:  Responds to questions appropriately with a normal affect.   Intake/Output Summary (Last 24 hours) at 03/31/2020 1258 Last data filed at 03/31/2020 1000 Gross per 24 hour  Intake 90 ml  Output 750 ml  Net -660 ml    Inpatient Medications:  . atorvastatin  80 mg Oral q1800  . Chlorhexidine Gluconate Cloth  6 each Topical Daily  . digoxin  0.125 mg Intravenous Daily  . docusate sodium  100 mg Oral BID  . famotidine  20 mg Oral BID  . hydrocortisone sod succinate (SOLU-CORTEF) inj  50 mg Intravenous Q12H  . insulin aspart  0-15 Units Subcutaneous Q4H  . metoprolol tartrate  25 mg Oral BID  . mupirocin ointment  1 application Nasal BID  . polyethylene glycol  17 g Oral Daily  . sertraline  100 mg Oral Daily  . sodium chloride flush  10-40 mL Intracatheter Q12H   Infusions:  . sodium chloride Stopped (03/29/20 0814)  . albumin human 12.5 g (03/31/20 0859)  .  ceFAZolin (ANCEF) IV 1 g (03/31/20 06/01/20)  . methocarbamol (ROBAXIN) IV Stopped (03/27/20 1322)    Labs: Recent Labs    03/30/20 0535 03/30/20 1118 03/31/20 0450  NA 139 143  --   K 4.0 4.1  --   CL 108 110  --   CO2 25 25  --   GLUCOSE 137* 143*  --   BUN 33* 29*  --   CREATININE 0.75 0.74  --   CALCIUM 7.9* 8.0*  --   MG 2.1  --  2.2  PHOS 2.0* 1.9*  --    Recent Labs    03/30/20 1118  ALBUMIN 2.9*   Recent Labs    03/30/20 0535 03/31/20 0450  WBC 14.0* 12.4*  HGB 7.7* 7.8*  HCT 22.4* 22.6*  MCV 91.8 91.5  PLT 80* 93*   No results for input(s): CKTOTAL, CKMB, TROPONINI in the last 72 hours. Invalid input(s): POCBNP No results for input(s): HGBA1C in the last 72 hours.   Weights: Filed Weights   03/26/20 1440 03/27/20 1428  Weight: 89.8 kg 89.8 kg     Radiology/Studies:  DG Chest 1 View  Result Date: 03/26/2020 CLINICAL DATA:  86 year old female with right femoral fracture. EXAM: CHEST  1 VIEW COMPARISON:   Chest radiograph dated 12/20/2017. FINDINGS: No focal consolidation, pleural effusion, or pneumothorax. Mild chronic interstitial coarsening. There is stable cardiomegaly. Osteopenia. No acute osseous pathology. IMPRESSION: No acute cardiopulmonary process. Electronically Signed   By: Elgie Collard M.D.   On: 03/26/2020 16:28   DG Pelvis 1-2 Views  Result Date: 03/26/2020 CLINICAL DATA:  86 year old female with fall and right hip pain. EXAM: RIGHT FEMUR 2 VIEWS; PELVIS - 1-2 VIEW COMPARISON:  CT abdomen pelvis dated 01/19/2018. FINDINGS: There is a displaced oblique or spiral fracture of the proximal right femur extending from the femoral neck into the proximal femoral diaphysis. There is full shaft width medial displacement of the distal fracture fragment. There is no dislocation. The bones are osteopenic. Vascular calcification as well as calcified fibroid over the left pelvis. The soft tissues are grossly unremarkable. IMPRESSION: Displaced oblique or spiral fracture of the proximal right femur. Electronically Signed   By: Elgie Collard M.D.   On: 03/26/2020 16:28   DG Chest Port 1 View  Result Date: 03/28/2020 CLINICAL DATA:  Acute respiratory failure. EXAM: PORTABLE CHEST 1 VIEW COMPARISON:  03/27/2020. FINDINGS: Endotracheal tube, right IJ line in stable position. Cardiomegaly. No pulmonary venous congestion. Low lung volumes with mild bibasilar atelectasis/infiltrates. Tiny left pleural effusion cannot be excluded. No pneumothorax. IMPRESSION: 1.  Lines and tubes in stable position. 2.  Stable cardiomegaly.  No pulmonary venous congestion. 3. Low lung volumes with mild bibasilar atelectasis/infiltrates. Small left pleural effusion cannot be excluded. Similar findings noted on prior exam. Electronically Signed   By: Maisie Fus  Register   On: 03/28/2020 05:23   Portable Chest x-ray  Result Date: 03/27/2020 CLINICAL DATA:  Post intubation. EXAM: PORTABLE CHEST 1 VIEW COMPARISON:  Radiograph yesterday.  FINDINGS: Endotracheal tube tip 5.6 cm in the carina. Right internal jugular central venous catheter tip projects over the upper SVC. Similar cardiomegaly. No pneumothorax. Low lung volumes. Minimal blunting of the costophrenic angles may be due to small effusions. Chronic interstitial coarsening. No confluent airspace disease. IMPRESSION: 1. Endotracheal tube tip 5.6 cm in the carina. 2. Right internal jugular central venous catheter tip projects over the upper SVC. No pneumothorax. 3. Low lung volumes. Similar cardiomegaly. Possible small pleural effusions. Electronically Signed   By: Narda Rutherford M.D.   On: 03/27/2020 21:16   ECHOCARDIOGRAM COMPLETE  Result Date: 03/28/2020    ECHOCARDIOGRAM REPORT   Patient Name:   SHAYLEE STANISLAWSKI Date of Exam: 03/28/2020 Medical Rec #:  [id number]        Height:       66.0 in Accession #:    [id number]       Weight:       198.0 lb Date of Birth:  [birth year]        BSA:          1.991 m Patient Age:    86 years         BP:  99/76 mmHg Patient Gender: F                HR:           94 bpm. Exam Location:  [facility] Procedure: 2D Echo, Color Doppler and Cardiac Doppler Indications:     I48.91 Atrial fibrillation  History:         Patient has prior history of Echocardiogram examinations.                  Previous Myocardial Infarction and CAD; Risk Factors:Sleep                  Apnea and Hypertension. Recurrent pulmonary emboli.  Sonographer:     Humphrey Rolls RDCS (AE) Referring Phys:  [id number] Judithe Modest Diagnosing Phys: Arnoldo Hooker MD  Sonographer Comments: Echo performed with patient supine and on artificial respirator and Technically difficult study due to poor echo windows. Image acquisition challenging due to patient body habitus. IMPRESSIONS  1. Left ventricular ejection fraction, by estimation, is 50 to 55%. The left ventricle has low normal function. The left ventricle has no regional wall motion abnormalities. Left ventricular diastolic function could not  be evaluated.  2. Right ventricular systolic function is normal. The right ventricular size is normal. There is mildly elevated pulmonary artery systolic pressure.  3. Left atrial size was moderately dilated.  4. The mitral valve is normal in structure. Mild to moderate mitral valve regurgitation.  5. The aortic valve is normal in structure. Aortic valve regurgitation is trivial. FINDINGS  Left Ventricle: Left ventricular ejection fraction, by estimation, is 50 to 55%. The left ventricle has low normal function. The left ventricle has no regional wall motion abnormalities. The left ventricular internal cavity size was normal in size. There is no left ventricular hypertrophy. Left ventricular diastolic function could not be evaluated. Right Ventricle: The right ventricular size is normal. No increase in right ventricular wall thickness. Right ventricular systolic function is normal. There is mildly elevated pulmonary artery systolic pressure. The tricuspid regurgitant velocity is 2.55  m/s, and with an assumed right atrial pressure of 10 mmHg, the estimated right ventricular systolic pressure is 36.0 mmHg. Left Atrium: Left atrial size was moderately dilated. Right Atrium: Right atrial size was normal in size. Pericardium: There is no evidence of pericardial effusion. Mitral Valve: The mitral valve is normal in structure. Mild to moderate mitral valve regurgitation. MV peak gradient, 2.9 mmHg. The mean mitral valve gradient is 2.0 mmHg. Tricuspid Valve: The tricuspid valve is normal in structure. Tricuspid valve regurgitation is mild. Aortic Valve: The aortic valve is normal in structure. Aortic valve regurgitation is trivial. Aortic valve mean gradient measures 7.0 mmHg. Aortic valve peak gradient measures 13.2 mmHg. Aortic valve area, by VTI measures 1.12 cm. Pulmonic Valve: The pulmonic valve was normal in structure. Pulmonic valve regurgitation is trivial. Aorta: The aortic root and ascending aorta are  structurally normal, with no evidence of dilitation. IAS/Shunts: No atrial level shunt detected by color flow Doppler.  LEFT VENTRICLE PLAX 2D LVIDd:         3.43 cm  Diastology LVIDs:         2.80 cm  LV e' lateral:   8.59 cm/s LV PW:         0.97 cm  LV E/e' lateral: 9.5 LV IVS:        1.05 cm LVOT diam:     2.20 cm LV SV:  31 LV SV Index:   15 LVOT Area:     3.80 cm  RIGHT VENTRICLE RV Basal diam:  3.85 cm LEFT ATRIUM              Index       RIGHT ATRIUM           Index LA diam:        4.20 cm  2.11 cm/m  RA Area:     14.70 cm LA Vol (A2C):   44.0 ml  22.10 ml/m RA Volume:   34.40 ml  17.28 ml/m LA Vol (A4C):   103.0 ml 51.73 ml/m LA Biplane Vol: 70.8 ml  35.56 ml/m  AORTIC VALVE                    PULMONIC VALVE AV Area (Vmax):    1.17 cm     PV Vmax:       0.97 m/s AV Area (Vmean):   1.09 cm     PV Vmean:      68.400 cm/s AV Area (VTI):     1.12 cm     PV VTI:        0.150 m AV Vmax:           182.00 cm/s  PV Peak grad:  3.7 mmHg AV Vmean:          126.000 cm/s PV Mean grad:  2.0 mmHg AV VTI:            0.275 m AV Peak Grad:      13.2 mmHg AV Mean Grad:      7.0 mmHg LVOT Vmax:         56.10 cm/s LVOT Vmean:        36.200 cm/s LVOT VTI:          0.081 m LVOT/AV VTI ratio: 0.29  AORTA Ao Root diam: 3.40 cm MITRAL VALVE               TRICUSPID VALVE MV Area (PHT): 3.28 cm    TR Peak grad:   26.0 mmHg MV Peak grad:  2.9 mmHg    TR Vmax:        255.00 cm/s MV Mean grad:  2.0 mmHg MV Vmax:       0.85 m/s    SHUNTS MV Vmean:      62.2 cm/s   Systemic VTI:  0.08 m MV Decel Time: 231 msec    Systemic Diam: 2.20 cm MV E velocity: 81.45 cm/s Arnoldo Hooker MD Electronically signed by Arnoldo Hooker MD Signature Date/Time: 03/28/2020/7:02:42 PM    Final    DG HIP OPERATIVE UNILAT W OR W/O PELVIS RIGHT  Result Date: 03/27/2020 CLINICAL DATA:  86 year old female with right femoral ORIF. EXAM: OPERATIVE right HIP (WITH PELVIS IF PERFORMED) 2 VIEWS TECHNIQUE: Fluoroscopic spot image(s) were submitted for  interpretation post-operatively. COMPARISON:  None. FINDINGS: Eight intraoperative fluoroscopic spot images provided. The total fluoro time is 2 minutes 7 seconds and total air kerma of 20.6 mGy. Right femoral nail noted which appears intact. IMPRESSION: Status post right femoral nail. Electronically Signed   By: Elgie Collard M.D.   On: 03/27/2020 18:28   DG FEMUR, MIN 2 VIEWS RIGHT  Result Date: 03/27/2020 CLINICAL DATA:  Postop femur fracture fixation. EXAM: RIGHT FEMUR 2 VIEWS COMPARISON:  Radiograph yesterday. FINDINGS: Intramedullary nail with trans trochanteric and distal locking screw with proximal cerclage wire fixation of proximal femur fracture. Fracture is  in improved alignment compared to preoperative imaging. Recent postsurgical change includes air and edema in the soft tissues as well as skin staples. There are vascular calcifications. IMPRESSION: Post ORIF mid-proximal femur fracture without immediate postoperative complication. Electronically Signed   By: Narda RutherfordMelanie  Sanford M.D.   On: 03/27/2020 21:17   DG Femur Min 2 Views Right  Result Date: 03/26/2020 CLINICAL DATA:  86 year old female with fall and right hip pain. EXAM: RIGHT FEMUR 2 VIEWS; PELVIS - 1-2 VIEW COMPARISON:  CT abdomen pelvis dated 01/19/2018. FINDINGS: There is a displaced oblique or spiral fracture of the proximal right femur extending from the femoral neck into the proximal femoral diaphysis. There is full shaft width medial displacement of the distal fracture fragment. There is no dislocation. The bones are osteopenic. Vascular calcification as well as calcified fibroid over the left pelvis. The soft tissues are grossly unremarkable. IMPRESSION: Displaced oblique or spiral fracture of the proximal right femur. Electronically Signed   By: Elgie CollardArash  Radparvar M.D.   On: 03/26/2020 16:28     Assessment and Recommendation  86 y.o. female with known paroxysmal nonvalvular atrial fibrillation with controlled ventricular rate  coronary atherosclerosis status post previous stents hypertension hyperlipidemia with fall and right leg fracture now status post orthopedic surgery hypotension and respiratory difficulty slowly improving without evidence of congestive heart failure or myocardial infarction 1. Continue supportive care status post surgery from pulmonary toilet ambulation and rehabilitation without restriction 2.  Continuation of metoprolol at current dose due to good control without change 3. No further cardiac diagnostics necessary at this time 4. Okay for reinstatement of anticoagulation for further risk reduction in stroke with atrial fibrillation soon as orthopedic surgery is okay at 5 mg of Eliquis twice per day 4.  Okay for beginning rehab at this time  Signed, Arnoldo HookerBruce Toshua Honsinger M.D. FACC

## 2020-04-01 LAB — CULTURE, BLOOD (ROUTINE X 2)
Culture: NO GROWTH
Culture: NO GROWTH
Special Requests: ADEQUATE

## 2020-04-01 LAB — BASIC METABOLIC PANEL
Anion gap: 6 (ref 5–15)
BUN: 33 mg/dL — ABNORMAL HIGH (ref 8–23)
CO2: 27 mmol/L (ref 22–32)
Calcium: 8.6 mg/dL — ABNORMAL LOW (ref 8.9–10.3)
Chloride: 110 mmol/L (ref 98–111)
Creatinine, Ser: 0.75 mg/dL (ref 0.44–1.00)
GFR calc Af Amer: >60 mL/min (ref >60)
GFR calc non Af Amer: >60 mL/min (ref >60)
Glucose, Bld: 101 mg/dL — ABNORMAL HIGH (ref 70–99)
Potassium: 4.3 mmol/L (ref 3.5–5.1)
Sodium: 143 mmol/L (ref 135–145)

## 2020-04-01 LAB — GLUCOSE, CAPILLARY
Glucose-Capillary: 110 mg/dL — ABNORMAL HIGH (ref 70–99)
Glucose-Capillary: 111 mg/dL — ABNORMAL HIGH (ref 70–99)
Glucose-Capillary: 119 mg/dL — ABNORMAL HIGH (ref 70–99)
Glucose-Capillary: 135 mg/dL — ABNORMAL HIGH (ref 70–99)
Glucose-Capillary: 150 mg/dL — ABNORMAL HIGH (ref 70–99)
Glucose-Capillary: 73 mg/dL (ref 70–99)

## 2020-04-01 LAB — SARS CORONAVIRUS 2 BY RT PCR (HOSPITAL ORDER, PERFORMED IN ~~LOC~~ HOSPITAL LAB): SARS Coronavirus 2: NEGATIVE

## 2020-04-01 LAB — PHOSPHORUS: Phosphorus: 2.7 mg/dL (ref 2.5–4.6)

## 2020-04-01 MED ORDER — DOCUSATE SODIUM 100 MG PO CAPS: 100.0000 mg | ORAL_CAPSULE | Freq: Two times a day (BID) | ORAL | 0 refills | Status: DC | PRN

## 2020-04-01 MED ORDER — ACETAMINOPHEN 325 MG PO TABS
650.0000 mg | ORAL_TABLET | Freq: Once | ORAL | Status: AC
  Administered 2020-04-01: 650 mg via ORAL
  Filled 2020-04-01: qty 2

## 2020-04-01 MED ORDER — FLEET ENEMA 7-19 GM/118ML RE ENEM
1.0000 | ENEMA | Freq: Once | RECTAL | Status: AC
  Administered 2020-04-01: 1 via RECTAL

## 2020-04-01 MED ORDER — BISACODYL 10 MG RE SUPP
10.0000 mg | Freq: Every day | RECTAL | Status: DC
  Administered 2020-04-01 – 2020-04-02 (×2): 10 mg via RECTAL
  Filled 2020-04-01 (×2): qty 1

## 2020-04-01 MED ORDER — APIXABAN 5 MG PO TABS: 5.0000 mg | ORAL_TABLET | Freq: Two times a day (BID) | ORAL | 1 refills | Status: DC

## 2020-04-01 MED ORDER — METOPROLOL TARTRATE 25 MG PO TABS: 25.0000 mg | ORAL_TABLET | Freq: Two times a day (BID) | ORAL | 0 refills | Status: AC

## 2020-04-01 NOTE — Consult Note (Signed)
PHARMACY CONSULT NOTE  Pharmacy Consult for Electrolyte Monitoring and Replacement   Recent Labs: Potassium (mmol/L)  Date Value  04/01/2020 4.3   Magnesium (mg/dL)  Date Value  24/49/7530 2.2   Calcium (mg/dL)  Date Value  01/28/210 8.6 (L)   Albumin (g/dL)  Date Value  17/35/6701 2.9 (L)  09/28/2017 3.9   Phosphorus (mg/dL)  Date Value  41/11/129 2.7   Sodium (mmol/L)  Date Value  04/01/2020 143  09/28/2017 143   Corrected Ca: 9.26 mg/dL  Assessment: 87 y.o. Female w/ PMH CAD, NSTEMI s/p stents, HTN admitted 03/26/20 with Right displaced Femoral Shaft Fracture, s/p Intramedullary Fixation of the right femoral artery shaft fracture on 03/27/20.  Goal of Therapy:  Potassium 4.0 - 5.1 mmol/L Magnesium 2.0 - 2.4 mg/dL All Other Electrolytes WNL  Plan:  No replacement at this time. Will f/u with electrolytes on Thursday if patient is still here.      Ronnald Ramp ,PharmD, BCPS Clinical Pharmacist 04/01/2020 9:15 AM

## 2020-04-01 NOTE — Progress Notes (Signed)
OT Cancellation Note  Patient Details Name: DEZIRAE SERVICE MRN: [id number] DOB: [birth year]   Cancelled Treatment:    Reason Eval/Treat Not Completed: Other (comment). Consult received, chart reviewed. Pt working with PT upon attempt. Per MD, pt discharging to SNF this afternoon. Will re-attempt OT evaluation as appropriate.  Richrd Prime, MPH, MS, OTR/L ascom [phone] 04/01/20, 2:18 PM

## 2020-04-01 NOTE — Progress Notes (Addendum)
[facility]  - [place] [facility]   PATIENT NAME: Alison Russo    MR#:  [id number]  DATE OF BIRTH:  02-01-1926  SUBJECTIVE:  patient overall doing better. Feels overall weak. Ate some breakfast. Daughter in the room. A bit overwhelmed with everything and discharge plan  REVIEW OF SYSTEMS:   Review of Systems  Constitutional: Positive for malaise/fatigue. Negative for chills, fever and weight loss.  HENT: Negative for ear discharge, ear pain and nosebleeds.   Eyes: Negative for blurred vision, pain and discharge.  Respiratory: Negative for sputum production, shortness of breath, wheezing and stridor.   Cardiovascular: Negative for chest pain, palpitations, orthopnea and PND.  Gastrointestinal: Negative for abdominal pain, diarrhea, nausea and vomiting.  Genitourinary: Negative for frequency and urgency.  Musculoskeletal: Negative for back pain and joint pain.  Neurological: Positive for weakness. Negative for sensory change, speech change and focal weakness.  Psychiatric/Behavioral: Negative for depression and hallucinations. The patient is not nervous/anxious.    Tolerating Diet:yes Tolerating PT: recommends rehab  DRUG ALLERGIES:  No Known Allergies  VITALS:  Blood pressure (!) 144/101, pulse 91, temperature 98.3 F (36.8 C), resp. rate 18, height 5\' 6"  (1.676 m), weight 98.2 kg, SpO2 98 %.  PHYSICAL EXAMINATION:   Physical Exam  GENERAL:  86 y.o.-year-old patient lying in the bed with no acute distress.pallor+ weak  EYES: Pupils equal, round, reactive to light and accommodation. No scleral icterus.   HEENT: Head atraumatic, normocephalic. Oropharynx and nasopharynx clear.  NECK:  Supple, no jugular venous distention. No thyroid enlargement, no tenderness.  LUNGS: Normal breath sounds bilaterally, no wheezing, rales, rhonchi. No use of accessory muscles of respiration.  CARDIOVASCULAR: S1, S2 normal. No murmurs, rubs, or gallops.  ABDOMEN: Soft,  nontender, nondistended. Bowel sounds present. No organomegaly or mass.  EXTREMITIES: surgical dressing + right hip NEUROLOGIC: Cranial nerves II through XII are intact. No focal Motor or sensory deficits b/l.  weak PSYCHIATRIC:  patient is alert and oriented x 3.  SKIN: No obvious rash, lesion, or ulcer.  .pres  LABORATORY PANEL:  CBC Recent Labs  Lab 03/31/20 0450  WBC 12.4*  HGB 7.8*  HCT 22.6*  PLT 93*    Chemistries  Recent Labs  Lab 03/27/20 2122 03/28/20 0040 03/30/20 1118 03/31/20 0450 04/01/20 0803  NA 138   < >   < >  --  143  K 4.5   < >   < >  --  4.3  CL 108   < >   < >  --  110  CO2 26   < >   < >  --  27  GLUCOSE 189*   < >   < >  --  101*  BUN 24*   < >   < >  --  33*  CREATININE 0.82   < >   < >  --  0.75  CALCIUM 7.5*   < >   < >  --  8.6*  MG  --    < >  --  2.2  --   AST 30  --   --   --   --   ALT 14  --   --   --   --   ALKPHOS 34*  --   --   --   --   BILITOT 1.4*  --   --   --   --    < > = values in this interval  not displayed.   Cardiac Enzymes No results for input(s): TROPONINI in the last 168 hours. RADIOLOGY:  No results found. ASSESSMENT AND PLAN:  86 y.o. Female admitted 03/26/20 with Right displaced Femoral Shaft Fracture. She underwent Intramedullary Fixation of the right femoral artery shaft fracture on 03/27/20. Intraoperatively she was noted to have persistent venous oozing from the vastus lateralis and fracture site, with approximately 1200 cc of blood loss. She was given 2 units pRBC's intraoperatively.She returns to ICU post procedure and remains intubated. Upon arrival to ICU noted to have severe shock and atrial fibrillation with RVR.  Acute blood loss anemia -due to hip fracture with large hematoma that was evacuated -patient received couple units of blood transfusion -hemoglobin now stable at 7.1 -discussed with Dr. Kirtland Bouchard okay to resume eliquis  Postoperative respiratory failure -suspected due to profound circulatory shock  with a fib RVR and acute blood loss anemia -patient required mechanical ventilation postop and multiple visa pressers now improved -extubated 7/9/ 21 -on room air -CXR 7/9--no puml edema  Displaced fracture of the proximal right femur status post intramedullary fixation on  7/8/ 21 -Dr. Miller/Dr K okay to start eliquis -continue physical therapy. -TOC for discharge planning to rehab--[facility] when insurance auth available -no indication for IV antibiotics -will resume home meds -7/12>> surgical site dressing changes were done by Dr. Kirtland Bouchard. Follow his instructions at rehab  Postop a fib with RVR--new -required amiodarone -heart rate much improved -continue metoprolol, on eliquis -followed by Dr. Gwen Pounds  History of CAD -patient's Plavix is on hold given bleeding and now being started on eliquis due to new onset a fib -will defer to cardiology to work on blood thinners as outpatient.  Hypertension continue beta-blockers  Physical therapy recommends rehab [facility] for discharge planning-- patient has bed at [facility] how were pending insurance authorization okay from cardiology and ortho standpoint for discharge. Discussed with daughter and patient for rehab once bed available  Remove right IJ today--d/w RN  Procedures: right hip fracture surgery Family communication : daughter in the room Consults : ortho, cardiology, ICU CODE STATUS: full DVT Prophylaxis : eliquis  Status is: Inpatient  Remains inpatient appropriate because:Unsafe d/c plan   Dispo: The patient is from: Home              Anticipated d/c is to: SNF              Anticipated d/c date is: to [facility] once insurance authorization approved              Patient currently is medically stable to d/c.    TOTAL TIME TAKING CARE OF THIS PATIENT: *15* minutes.  >50% time spent on counselling and coordination of care  Note: This dictation was prepared with Dragon dictation along with smaller phrase  technology. Any transcriptional errors that result from this process are unintentional.  Enedina Finner M.D    [facility]   CC: Primary care physician; Gracelyn Nurse, MDPatient ID: Alison Russo, female   DOB: 07-28-1926, 86 y.o.   MRN: [id number]

## 2020-04-01 NOTE — TOC Progression Note (Signed)
Transition of Care [facility]) - Progression Note    Patient Details  Name: CLAUDETTA SALLIE MRN: [id number] Date of Birth: 10-17-1925  Transition of Care [facility]) CM/SW Contact  Shelbie Ammons, RN Phone Number: 04/01/2020, 1:29 PM  Clinical Narrative:   10:30: RNCM met with patient at bedside to present bed offers, daughter Hassan Rowan present as well. Provided bed offers as well as how to access Medicare.gov for ratings system of facilities. Patient and Hassan Rowan requested that this CM reach back out after lunch.  1:15pm: RNCM placed call into room and spoke with Hassan Rowan, she relayed that they have decided on [facility]. Notified facility rep as well as MD of same. RNCM will monitor and arrange transport when ready.     Expected Discharge Plan: [place] Barriers to Discharge: No Barriers Identified  Expected Discharge Plan and Services Expected Discharge Plan: [place]   Discharge Planning Services: CM Consult   Living arrangements for the past 2 months: Single Family Home                                       Social Determinants of Health (SDOH) Interventions    Readmission Risk Interventions No flowsheet data found.

## 2020-04-01 NOTE — Progress Notes (Signed)
[facility] Cardiology [facility] Encounter Note  Patient: Alison Russo / Admit Date: 03/26/2020 / Date of Encounter: 04/01/2020, 2:37 PM   Subjective: Patient appears to be tolerating transfer to telemetry well in addition to some physical therapy..  Blood pressure slightly improved .  Better heart rate control today with metoprolol dose.  Currently there is no evidence of chest discomfort congestive heart failure or acute coronary syndrome based on issues.  Patient is Tolerating readdition of Eliquis at this time without bleeding or bruising complication Review of Systems: Positive for: Shortness of breath weakness Negative for: Vision change, hearing change, syncope, dizziness, nausea, vomiting,diarrhea, bloody stool, stomach pain, cough, congestion, diaphoresis, urinary frequency, urinary pain,skin lesions, skin rashes Others previously listed  Objective: Telemetry: Atrial fibrillation with controlled ventricular rate Physical Exam: Blood pressure 110/77, pulse 69, temperature 98.1 F (36.7 C), resp. rate 18, height 5\' 6"  (1.676 m), weight 98.2 kg, SpO2 97 %. Body mass index is 34.93 kg/m. General: Well developed, well nourished, in no acute distress. Head: Normocephalic, atraumatic, sclera non-icteric, no xanthomas, nares are without discharge. Neck: No apparent masses Lungs: Normal respirations with few wheezes, some rhonchi, no rales , no crackles   Heart: Irregular rate and rhythm, normal S1 S2, no murmur, no rub, no gallop, PMI is normal size and placement, carotid upstroke normal without bruit, jugular venous pressure normal Abdomen: Soft, non-tender, non-distended with normoactive bowel sounds. No hepatosplenomegaly. Abdominal aorta is normal size without bruit Extremities: Trace to 1+ edema, no clubbing, no cyanosis, no ulcers,  Peripheral: 2+ radial, 2+ femoral, 2+ dorsal pedal pulses Neuro: Alert and oriented. Moves all extremities spontaneously. Psych:  Responds to  questions appropriately with a normal affect.   Intake/Output Summary (Last 24 hours) at 04/01/2020 1437 Last data filed at 04/01/2020 1330 Gross per 24 hour  Intake 730 ml  Output 500 ml  Net 230 ml    Inpatient Medications:  . apixaban  5 mg Oral BID  . atorvastatin  80 mg Oral q1800  . bisacodyl  10 mg Rectal Daily  . Chlorhexidine Gluconate Cloth  6 each Topical Daily  . docusate sodium  100 mg Oral BID  . famotidine  20 mg Oral BID  . insulin aspart  0-15 Units Subcutaneous Q4H  . metoprolol tartrate  25 mg Oral BID  . mupirocin ointment  1 application Nasal BID  . polyethylene glycol  17 g Oral Daily  . sertraline  100 mg Oral Daily  . sodium chloride flush  10-40 mL Intracatheter Q12H   Infusions:  . sodium chloride Stopped (03/29/20 0814)    Labs: Recent Labs    03/30/20 0535 03/30/20 0535 03/30/20 1118 03/31/20 0450 04/01/20 0803  NA 139   < > 143  --  143  K 4.0   < > 4.1  --  4.3  CL 108   < > 110  --  110  CO2 25   < > 25  --  27  GLUCOSE 137*   < > 143*  --  101*  BUN 33*   < > 29*  --  33*  CREATININE 0.75   < > 0.74  --  0.75  CALCIUM 7.9*   < > 8.0*  --  8.6*  MG 2.1  --   --  2.2  --   PHOS 2.0*   < > 1.9*  --  2.7   < > = values in this interval not displayed.   Recent Labs  03/30/20 1118  ALBUMIN 2.9*   Recent Labs    03/30/20 0535 03/31/20 0450  WBC 14.0* 12.4*  HGB 7.7* 7.8*  HCT 22.4* 22.6*  MCV 91.8 91.5  PLT 80* 93*   No results for input(s): CKTOTAL, CKMB, TROPONINI in the last 72 hours. Invalid input(s): POCBNP No results for input(s): HGBA1C in the last 72 hours.   Weights: Filed Weights   03/26/20 1440 03/27/20 1428 04/01/20 0622  Weight: 89.8 kg 89.8 kg 98.2 kg     Radiology/Studies:  DG Chest 1 View  Result Date: 03/26/2020 CLINICAL DATA:  86 year old female with right femoral fracture. EXAM: CHEST  1 VIEW COMPARISON:  Chest radiograph dated 12/20/2017. FINDINGS: No focal consolidation, pleural effusion, or  pneumothorax. Mild chronic interstitial coarsening. There is stable cardiomegaly. Osteopenia. No acute osseous pathology. IMPRESSION: No acute cardiopulmonary process. Electronically Signed   By: Elgie Collard M.D.   On: 03/26/2020 16:28   DG Pelvis 1-2 Views  Result Date: 03/26/2020 CLINICAL DATA:  86 year old female with fall and right hip pain. EXAM: RIGHT FEMUR 2 VIEWS; PELVIS - 1-2 VIEW COMPARISON:  CT abdomen pelvis dated 01/19/2018. FINDINGS: There is a displaced oblique or spiral fracture of the proximal right femur extending from the femoral neck into the proximal femoral diaphysis. There is full shaft width medial displacement of the distal fracture fragment. There is no dislocation. The bones are osteopenic. Vascular calcification as well as calcified fibroid over the left pelvis. The soft tissues are grossly unremarkable. IMPRESSION: Displaced oblique or spiral fracture of the proximal right femur. Electronically Signed   By: Elgie Collard M.D.   On: 03/26/2020 16:28   DG Chest Port 1 View  Result Date: 03/28/2020 CLINICAL DATA:  Acute respiratory failure. EXAM: PORTABLE CHEST 1 VIEW COMPARISON:  03/27/2020. FINDINGS: Endotracheal tube, right IJ line in stable position. Cardiomegaly. No pulmonary venous congestion. Low lung volumes with mild bibasilar atelectasis/infiltrates. Tiny left pleural effusion cannot be excluded. No pneumothorax. IMPRESSION: 1.  Lines and tubes in stable position. 2.  Stable cardiomegaly.  No pulmonary venous congestion. 3. Low lung volumes with mild bibasilar atelectasis/infiltrates. Small left pleural effusion cannot be excluded. Similar findings noted on prior exam. Electronically Signed   By: Maisie Fus  Register   On: 03/28/2020 05:23   Portable Chest x-ray  Result Date: 03/27/2020 CLINICAL DATA:  Post intubation. EXAM: PORTABLE CHEST 1 VIEW COMPARISON:  Radiograph yesterday. FINDINGS: Endotracheal tube tip 5.6 cm in the carina. Right internal jugular central  venous catheter tip projects over the upper SVC. Similar cardiomegaly. No pneumothorax. Low lung volumes. Minimal blunting of the costophrenic angles may be due to small effusions. Chronic interstitial coarsening. No confluent airspace disease. IMPRESSION: 1. Endotracheal tube tip 5.6 cm in the carina. 2. Right internal jugular central venous catheter tip projects over the upper SVC. No pneumothorax. 3. Low lung volumes. Similar cardiomegaly. Possible small pleural effusions. Electronically Signed   By: Narda Rutherford M.D.   On: 03/27/2020 21:16   ECHOCARDIOGRAM COMPLETE  Result Date: 03/28/2020    ECHOCARDIOGRAM REPORT   Patient Name:   Alison Russo Date of Exam: 03/28/2020 Medical Rec #:  [id number]        Height:       66.0 in Accession #:    [id number]       Weight:       198.0 lb Date of Birth:  05-15-1926        BSA:          1.991  m Patient Age:    86 years         BP:           99/76 mmHg Patient Gender: F                HR:           94 bpm. Exam Location:  [facility] Procedure: 2D Echo, Color Doppler and Cardiac Doppler Indications:     I48.91 Atrial fibrillation  History:         Patient has prior history of Echocardiogram examinations.                  Previous Myocardial Infarction and CAD; Risk Factors:Sleep                  Apnea and Hypertension. Recurrent pulmonary emboli.  Sonographer:     Humphrey RollsJoan Heiss RDCS (AE) Referring Phys:  [id number] Judithe ModestJEREMIAH D KEENE Diagnosing Phys: Arnoldo HookerBruce Braydee Shimkus MD  Sonographer Comments: Echo performed with patient supine and on artificial respirator and Technically difficult study due to poor echo windows. Image acquisition challenging due to patient body habitus. IMPRESSIONS  1. Left ventricular ejection fraction, by estimation, is 50 to 55%. The left ventricle has low normal function. The left ventricle has no regional wall motion abnormalities. Left ventricular diastolic function could not be evaluated.  2. Right ventricular systolic function is normal. The right  ventricular size is normal. There is mildly elevated pulmonary artery systolic pressure.  3. Left atrial size was moderately dilated.  4. The mitral valve is normal in structure. Mild to moderate mitral valve regurgitation.  5. The aortic valve is normal in structure. Aortic valve regurgitation is trivial. FINDINGS  Left Ventricle: Left ventricular ejection fraction, by estimation, is 50 to 55%. The left ventricle has low normal function. The left ventricle has no regional wall motion abnormalities. The left ventricular internal cavity size was normal in size. There is no left ventricular hypertrophy. Left ventricular diastolic function could not be evaluated. Right Ventricle: The right ventricular size is normal. No increase in right ventricular wall thickness. Right ventricular systolic function is normal. There is mildly elevated pulmonary artery systolic pressure. The tricuspid regurgitant velocity is 2.55  m/s, and with an assumed right atrial pressure of 10 mmHg, the estimated right ventricular systolic pressure is 36.0 mmHg. Left Atrium: Left atrial size was moderately dilated. Right Atrium: Right atrial size was normal in size. Pericardium: There is no evidence of pericardial effusion. Mitral Valve: The mitral valve is normal in structure. Mild to moderate mitral valve regurgitation. MV peak gradient, 2.9 mmHg. The mean mitral valve gradient is 2.0 mmHg. Tricuspid Valve: The tricuspid valve is normal in structure. Tricuspid valve regurgitation is mild. Aortic Valve: The aortic valve is normal in structure. Aortic valve regurgitation is trivial. Aortic valve mean gradient measures 7.0 mmHg. Aortic valve peak gradient measures 13.2 mmHg. Aortic valve area, by VTI measures 1.12 cm. Pulmonic Valve: The pulmonic valve was normal in structure. Pulmonic valve regurgitation is trivial. Aorta: The aortic root and ascending aorta are structurally normal, with no evidence of dilitation. IAS/Shunts: No atrial level  shunt detected by color flow Doppler.  LEFT VENTRICLE PLAX 2D LVIDd:         3.43 cm  Diastology LVIDs:         2.80 cm  LV e' lateral:   8.59 cm/s LV PW:         0.97 cm  LV E/e' lateral: 9.5 LV  IVS:        1.05 cm LVOT diam:     2.20 cm LV SV:         31 LV SV Index:   15 LVOT Area:     3.80 cm  RIGHT VENTRICLE RV Basal diam:  3.85 cm LEFT ATRIUM              Index       RIGHT ATRIUM           Index LA diam:        4.20 cm  2.11 cm/m  RA Area:     14.70 cm LA Vol (A2C):   44.0 ml  22.10 ml/m RA Volume:   34.40 ml  17.28 ml/m LA Vol (A4C):   103.0 ml 51.73 ml/m LA Biplane Vol: 70.8 ml  35.56 ml/m  AORTIC VALVE                    PULMONIC VALVE AV Area (Vmax):    1.17 cm     PV Vmax:       0.97 m/s AV Area (Vmean):   1.09 cm     PV Vmean:      68.400 cm/s AV Area (VTI):     1.12 cm     PV VTI:        0.150 m AV Vmax:           182.00 cm/s  PV Peak grad:  3.7 mmHg AV Vmean:          126.000 cm/s PV Mean grad:  2.0 mmHg AV VTI:            0.275 m AV Peak Grad:      13.2 mmHg AV Mean Grad:      7.0 mmHg LVOT Vmax:         56.10 cm/s LVOT Vmean:        36.200 cm/s LVOT VTI:          0.081 m LVOT/AV VTI ratio: 0.29  AORTA Ao Root diam: 3.40 cm MITRAL VALVE               TRICUSPID VALVE MV Area (PHT): 3.28 cm    TR Peak grad:   26.0 mmHg MV Peak grad:  2.9 mmHg    TR Vmax:        255.00 cm/s MV Mean grad:  2.0 mmHg MV Vmax:       0.85 m/s    SHUNTS MV Vmean:      62.2 cm/s   Systemic VTI:  0.08 m MV Decel Time: 231 msec    Systemic Diam: 2.20 cm MV E velocity: 81.45 cm/s Arnoldo Hooker MD Electronically signed by Arnoldo Hooker MD Signature Date/Time: 03/28/2020/7:02:42 PM    Final    DG HIP OPERATIVE UNILAT W OR W/O PELVIS RIGHT  Result Date: 03/27/2020 CLINICAL DATA:  86 year old female with right femoral ORIF. EXAM: OPERATIVE right HIP (WITH PELVIS IF PERFORMED) 2 VIEWS TECHNIQUE: Fluoroscopic spot image(s) were submitted for interpretation post-operatively. COMPARISON:  None. FINDINGS: Eight intraoperative  fluoroscopic spot images provided. The total fluoro time is 2 minutes 7 seconds and total air kerma of 20.6 mGy. Right femoral nail noted which appears intact. IMPRESSION: Status post right femoral nail. Electronically Signed   By: Elgie Collard M.D.   On: 03/27/2020 18:28   DG FEMUR, MIN 2 VIEWS RIGHT  Result Date: 03/27/2020 CLINICAL DATA:  Postop femur fracture fixation. EXAM: RIGHT  FEMUR 2 VIEWS COMPARISON:  Radiograph yesterday. FINDINGS: Intramedullary nail with trans trochanteric and distal locking screw with proximal cerclage wire fixation of proximal femur fracture. Fracture is in improved alignment compared to preoperative imaging. Recent postsurgical change includes air and edema in the soft tissues as well as skin staples. There are vascular calcifications. IMPRESSION: Post ORIF mid-proximal femur fracture without immediate postoperative complication. Electronically Signed   By: Narda Rutherford M.D.   On: 03/27/2020 21:17   DG Femur Min 2 Views Right  Result Date: 03/26/2020 CLINICAL DATA:  86 year old female with fall and right hip pain. EXAM: RIGHT FEMUR 2 VIEWS; PELVIS - 1-2 VIEW COMPARISON:  CT abdomen pelvis dated 01/19/2018. FINDINGS: There is a displaced oblique or spiral fracture of the proximal right femur extending from the femoral neck into the proximal femoral diaphysis. There is full shaft width medial displacement of the distal fracture fragment. There is no dislocation. The bones are osteopenic. Vascular calcification as well as calcified fibroid over the left pelvis. The soft tissues are grossly unremarkable. IMPRESSION: Displaced oblique or spiral fracture of the proximal right femur. Electronically Signed   By: Elgie Collard M.D.   On: 03/26/2020 16:28     Assessment and Recommendation  86 y.o. female with known paroxysmal nonvalvular atrial fibrillation with controlled ventricular rate coronary atherosclerosis status post previous stents hypertension hyperlipidemia with  fall and right leg fracture now status post orthopedic surgery hypotension and respiratory difficulty slowly improving without evidence of congestive heart failure or myocardial infarction 1. Continue supportive care status post surgery from pulmonary toilet ambulation and rehabilitation without restriction 2.  Continuation of metoprolol at current dose due to good control without change 3. No further cardiac diagnostics necessary at this time 4.  Continuation of Eliquis at 5 mg twice per day for risk reduction of stroke with atrial fibrillation 4.  Continue in-hospital rehab and okay for further discharged home from cardiac standpoint with current medical regimen without change 5.  Call if further questions otherwise assuming patient will be discharged with current treatment  Signed, Arnoldo Hooker M.D. FACC

## 2020-04-01 NOTE — Progress Notes (Signed)
Physical Therapy Treatment Patient Details Name: Alison Russo MRN: [id number] DOB: 1926/03/11 Today's Date: 04/01/2020    History of Present Illness Pt is a 86 y.o. female presenting to hospital 7/7 s/p mechanical fall in driveway sustaining R hip pain and mild pain in R elbow.  Imaging showing displaced oblique or spiral fx of proximal R femur.  Pt s/p IM fixation of R femoral shaft fx with cerclage cable 7/8; intraoperatively she was noted to have persistent venous oozing from the vastus lateralis and fracture site, with approximately 1200 cc of blood loss;  2 units PRBC's intraoperatively and also received PRBC's post op; transferred to CCU post op intubated for cardiac monitoring and with post op respiratory failure; and upon arrival to ICU noted to be in severe shock and a-fib with RVR; extubated 7/9.  PMH includes anginal pain, CAD, heart murmur, htn, mood disorder, NSTEMI, OSA on CPAP, PE, lymphedema, a-fib, and cardiac cath.    PT Comments    Pt was long sitting in bed with supportive daughter at bedside. She agrees to PT session with encouragement. Agrees to trial EOB/OOB activity. She is alert throughout and able to follow commands consistently. She required increased time + mod-max assist to achieve EOB sitting. Max assist after EOB activity 2/2 to fatigue to return to supine. While seated EOB, pt perform exercises and did stand 1 x. Stood 1 x ~ 20 seconds while RN changed pads. Pt does fatigue quickly with slight SOB noted. However sao2 >90 % and HR stable throughout. At conclusion of session, pt was in bed with RN in room to give suppository. Acute PT will continue to follow per POC.        Follow Up Recommendations  SNF     Equipment Recommendations  Rolling walker with 5" wheels;3in1 (PT);Wheelchair (measurements PT);Wheelchair cushion (measurements PT)    Recommendations for Other Services       Precautions / Restrictions Precautions Precautions:  Fall Restrictions Weight Bearing Restrictions: Yes RLE Weight Bearing: Weight bearing as tolerated    Mobility  Bed Mobility Overal bed mobility: Needs Assistance Bed Mobility: Supine to Sit;Sit to Supine     Supine to sit: Mod assist;Max assist;HOB elevated Sit to supine: Max assist;HOB elevated   General bed mobility comments: Pt was able to achieve EOB sitting with mod-max assist + increased time and vcs for technique and sequencing. Sat EOB x 15 minutes while performing exercises and working on sitting balance.   Transfers Overall transfer level: Needs assistance Equipment used:  (BUE support by therapist while blocking BLE(knees) ) Transfers: Sit to/from Stand Sit to Stand: +2 safety/equipment;From elevated surface         General transfer comment: Pt was able to STS 1 x max assist + max vcs for safety and incraese fwd wt shift. stood ~ 20 secounds while RN changed pads/linen  Ambulation/Gait             General Gait Details: unsafe unable to progress at this time.    Stairs             Wheelchair Mobility    Modified Rankin (Stroke Patients Only)       Balance Overall balance assessment: Needs assistance Sitting-balance support: Bilateral upper extremity supported;Feet supported Sitting balance-Leahy Scale: Fair Sitting balance - Comments: CGA at first progressed to close SBA for safety.    Standing balance support: Bilateral upper extremity supported;During functional activity Standing balance-Leahy Scale: Poor Standing balance comment: max assist to maintain static standing  balance at EOB x ~ 20 sec                            Cognition Arousal/Alertness: Awake/alert Behavior During Therapy: Anxious;WFL for tasks assessed/performed Overall Cognitive Status: Within Functional Limits for tasks assessed                                 General Comments: Pt was alert throughout. Able to follow commands well with increased  time. pt is HOH.      Exercises General Exercises - Lower Extremity Ankle Circles/Pumps: AROM;Both;10 reps Quad Sets: AROM;Both;10 reps Gluteal Sets: AROM;Both;10 reps Heel Slides: AROM;Both;10 reps Hip ABduction/ADduction: AROM;Both;10 reps    General Comments        Pertinent Vitals/Pain Pain Assessment: 0-10 Pain Score: 3  Faces Pain Scale: Hurts a little bit Pain Location: R leg with movement Pain Descriptors / Indicators: Aching;Sore;Tender Pain Intervention(s): Limited activity within patient's tolerance;Monitored during session;Premedicated before session;Repositioned    Home Living                      Prior Function            PT Goals (current goals can now be found in the care plan section) Acute Rehab PT Goals Patient Stated Goal: to go home Progress towards PT goals: Progressing toward goals    Frequency    7X/week      PT Plan Current plan remains appropriate    Co-evaluation              AM-PAC PT "6 Clicks" Mobility   Outcome Measure  Help needed turning from your back to your side while in a flat bed without using bedrails?: A Little Help needed moving from lying on your back to sitting on the side of a flat bed without using bedrails?: A Lot Help needed moving to and from a bed to a chair (including a wheelchair)?: A Lot Help needed standing up from a chair using your arms (e.g., wheelchair or bedside chair)?: A Lot Help needed to walk in hospital room?: Total Help needed climbing 3-5 steps with a railing? : Total 6 Click Score: 11    End of Session Equipment Utilized During Treatment: Gait belt Activity Tolerance: Patient limited by fatigue;Patient limited by pain Patient left: in bed;with call [place]/phone within reach;with bed alarm set;with nursing/sitter in room;with family/visitor present Nurse Communication: Mobility status;Precautions;Weight bearing status PT Visit Diagnosis: Other abnormalities of gait and mobility  (R26.89);Muscle weakness (generalized) (M62.81);History of falling (Z91.81);Difficulty in walking, not elsewhere classified (R26.2);Pain Pain - Right/Left: Right Pain - part of body: Hip     Time: 1350-1420 PT Time Calculation (min) (ACUTE ONLY): 30 min  Charges:  $Therapeutic Exercise: 8-22 mins $Therapeutic Activity: 8-22 mins                     Jetta Lout PTA 04/01/20, 4:42 PM

## 2020-04-02 DIAGNOSIS — Z7401 Bed confinement status: Secondary | ICD-10-CM | POA: Diagnosis not present

## 2020-04-02 DIAGNOSIS — F419 Anxiety disorder, unspecified: Secondary | ICD-10-CM | POA: Diagnosis not present

## 2020-04-02 DIAGNOSIS — I4891 Unspecified atrial fibrillation: Secondary | ICD-10-CM | POA: Diagnosis not present

## 2020-04-02 DIAGNOSIS — W19XXXD Unspecified fall, subsequent encounter: Secondary | ICD-10-CM | POA: Diagnosis not present

## 2020-04-02 DIAGNOSIS — S72464A Nondisplaced supracondylar fracture with intracondylar extension of lower end of right femur, initial encounter for closed fracture: Secondary | ICD-10-CM

## 2020-04-02 DIAGNOSIS — D62 Acute posthemorrhagic anemia: Secondary | ICD-10-CM | POA: Diagnosis not present

## 2020-04-02 DIAGNOSIS — S79929A Unspecified injury of unspecified thigh, initial encounter: Secondary | ICD-10-CM | POA: Diagnosis not present

## 2020-04-02 DIAGNOSIS — D649 Anemia, unspecified: Secondary | ICD-10-CM | POA: Diagnosis not present

## 2020-04-02 DIAGNOSIS — I480 Paroxysmal atrial fibrillation: Secondary | ICD-10-CM | POA: Diagnosis not present

## 2020-04-02 DIAGNOSIS — I2510 Atherosclerotic heart disease of native coronary artery without angina pectoris: Secondary | ICD-10-CM | POA: Diagnosis not present

## 2020-04-02 DIAGNOSIS — M2550 Pain in unspecified joint: Secondary | ICD-10-CM | POA: Diagnosis not present

## 2020-04-02 DIAGNOSIS — I10 Essential (primary) hypertension: Secondary | ICD-10-CM | POA: Diagnosis not present

## 2020-04-02 DIAGNOSIS — K219 Gastro-esophageal reflux disease without esophagitis: Secondary | ICD-10-CM | POA: Diagnosis not present

## 2020-04-02 DIAGNOSIS — S728X1A Other fracture of right femur, initial encounter for closed fracture: Secondary | ICD-10-CM | POA: Diagnosis not present

## 2020-04-02 DIAGNOSIS — I252 Old myocardial infarction: Secondary | ICD-10-CM | POA: Diagnosis not present

## 2020-04-02 DIAGNOSIS — R0902 Hypoxemia: Secondary | ICD-10-CM | POA: Diagnosis not present

## 2020-04-02 DIAGNOSIS — J9601 Acute respiratory failure with hypoxia: Secondary | ICD-10-CM | POA: Diagnosis not present

## 2020-04-02 DIAGNOSIS — Z86711 Personal history of pulmonary embolism: Secondary | ICD-10-CM | POA: Diagnosis not present

## 2020-04-02 DIAGNOSIS — R011 Cardiac murmur, unspecified: Secondary | ICD-10-CM | POA: Diagnosis not present

## 2020-04-02 DIAGNOSIS — W19XXXA Unspecified fall, initial encounter: Secondary | ICD-10-CM | POA: Diagnosis not present

## 2020-04-02 DIAGNOSIS — M80051D Age-related osteoporosis with current pathological fracture, right femur, subsequent encounter for fracture with routine healing: Secondary | ICD-10-CM | POA: Diagnosis not present

## 2020-04-02 DIAGNOSIS — Z7901 Long term (current) use of anticoagulants: Secondary | ICD-10-CM | POA: Diagnosis not present

## 2020-04-02 DIAGNOSIS — F329 Major depressive disorder, single episode, unspecified: Secondary | ICD-10-CM | POA: Diagnosis not present

## 2020-04-02 DIAGNOSIS — G4733 Obstructive sleep apnea (adult) (pediatric): Secondary | ICD-10-CM | POA: Diagnosis not present

## 2020-04-02 DIAGNOSIS — S72021D Displaced fracture of epiphysis (separation) (upper) of right femur, subsequent encounter for closed fracture with routine healing: Secondary | ICD-10-CM | POA: Diagnosis not present

## 2020-04-02 DIAGNOSIS — Z09 Encounter for follow-up examination after completed treatment for conditions other than malignant neoplasm: Secondary | ICD-10-CM | POA: Diagnosis not present

## 2020-04-02 LAB — GLUCOSE, CAPILLARY
Glucose-Capillary: 112 mg/dL — ABNORMAL HIGH (ref 70–99)
Glucose-Capillary: 121 mg/dL — ABNORMAL HIGH (ref 70–99)
Glucose-Capillary: 115 mg/dL — ABNORMAL HIGH (ref 70–99)

## 2020-04-02 MED ORDER — TRAMADOL HCL 50 MG PO TABS: 50.0000 mg | ORAL_TABLET | Freq: Three times a day (TID) | ORAL | 0 refills | Status: DC | PRN

## 2020-04-02 NOTE — TOC Transition Note (Signed)
Transition of Care [facility]) - CM/SW Discharge Note   Patient Details  Name: Alison Russo MRN: [id number] Date of Birth: December 24, 1925  [facility] CM/SW Contact:  Trenton Founds, RN Phone Number: 04/02/2020, 8:25 AM   Clinical Narrative:   RNCM received insurance auth from [facility]. SNF auth # is [id number] and ambulance auth # is [id number], both are good for 7 days. Case manager following will either be Judeth Cornfield or [place]. Called and spoke with Verlon Au at [facility], patient will be going to room 401 and report will be called to [phone]. EMS paperwork completed and taken to unit, transport will be called when patient ready.       Barriers to Discharge: No Barriers Identified   Patient Goals and CMS Choice        Discharge Placement                       Discharge Plan and Services   Discharge Planning Services: CM Consult                                 Social Determinants of Health (SDOH) Interventions     Readmission Risk Interventions No flowsheet data found.

## 2020-04-02 NOTE — Discharge Instructions (Signed)
Right hip dressing change per Dr Delora Fuel

## 2020-04-02 NOTE — Discharge Summary (Signed)
[facility] - [place] at [facility]   PATIENT NAME: Alison Russo    MR#:  [id number]  DATE OF BIRTH:  Jan 19, 1926  DATE OF ADMISSION:  03/26/2020 ADMITTING PHYSICIAN: Darlin Priestly, MD  DATE OF DISCHARGE: 04/02/2020 PRIMARY CARE PHYSICIAN: Gracelyn Nurse, MD    ADMISSION DIAGNOSIS:  Fall [W19.XXXA] Pre-op chest exam [Z01.811] Closed displaced fracture of proximal epiphysis of right femur (HCC) [S72.021A] Other closed fracture of right femur, unspecified portion of femur, initial encounter (HCC) [S72.8X1A]  DISCHARGE DIAGNOSIS:  Closed displaced fracture of proximal epiphysis of right femur status post surgery Postoperative respiratory failure secondary to circulatory shock resolved Acute blood loss anemia secondary to large hematoma status post evacuation of hematoma a fib with RVR-- on oral anticoagulation SECONDARY DIAGNOSIS:   Past Medical History:  Diagnosis Date  . Anginal pain (HCC)   . CAD (coronary artery disease)   . Heart murmur   . Hypertension   . Mood disorder (HCC)    mostly anxiety  . NSTEMI (non-ST elevated myocardial infarction) (HCC) 12/22/2017  . OSA on CPAP   . PE (pulmonary embolism)    recurrent    HOSPITAL COURSE:   86 y.o. Female admitted 03/26/20 with Right displaced Femoral Shaft Fracture. She underwent Intramedullary Fixation of the right femoral artery shaft fracture on 03/27/20. Intraoperatively she was noted to have persistent venous oozing from the vastus lateralis and fracture site, with approximately 1200 cc of blood loss. She was given 2 units pRBC's intraoperatively.Post op to ICU  And was intubated. Upon arrival to ICU noted to have severe shock and atrial fibrillation with RVR. Transferred out to [facility] 03/30/2020  Acute blood loss anemia -due to hip fracture with large hematoma that was evacuated -patient received about 4-5 units of blood transfusion -hemoglobin now stable at 7.8 -now on po eliquis  Postoperative  respiratory failure -suspected due to profound circulatory shock with a fib RVR and acute blood loss anemia -patient required mechanical ventilation postop and multiple visa pressers now improved -extubated 7/9/ 21 -on room air -CXR 7/9--no pulm edema  Displaced fracture of the proximal right femur status post intramedullary fixation on  7/8/ 21 -Dr. Miller/Dr K okay to start eliquis -continue physical therapy. -TOC for discharge planning to rehab--[facility] today since insurance auth available -resumed home meds -7/14>> surgical site dressing changes to be done by Dr. Kirtland Bouchard.  Prior to d/c today - Follow his instructions at rehab  Postop a fib with RVR--new -required amiodarone -heart rate much improved -continue metoprolol and  Eliquis recommended by Dr. Gwen Pounds  History of CAD -patient's Plavix is on hold given bleeding and now being started on eliquis due to new onset a fib -will defer to cardiology to work on blood thinners as outpatient.  Hypertension continue beta-blockers  -getting physical therapy recommends rehab -TOC for discharge planning-- patient has bed at [facility] husband next -okay from cardiology and ortho standpoint for discharge.  -d/c today after dressing change    Procedures: right hip fracture surgery Family communication : daughter in the room Consults : ortho, cardiology, ICU CODE STATUS: full DVT Prophylaxis : eliquis  Status is: Inpatient  Dispo: The patient is from: Home  Anticipated d/c is to: SNF  Anticipated d/c date is: to [facility] today since insurance authorization approved  Patient currently is medically stable to d/c.   CONSULTS OBTAINED:  Treatment Team:  Juanell Fairly, MD Lamar Blinks, MD  DRUG ALLERGIES:  No Known Allergies  DISCHARGE MEDICATIONS:  Allergies as of 04/02/2020   No Known Allergies     Medication List    STOP taking these  medications   clopidogrel 75 MG tablet Commonly known as: PLAVIX   traZODone 50 MG tablet Commonly known as: DESYREL     TAKE these medications   acetaminophen 325 MG tablet Commonly known as: TYLENOL Take 2 tablets (650 mg total) by mouth every 6 (six) hours as needed for mild pain (or Fever >/= 101).   apixaban 5 MG Tabs tablet Commonly known as: ELIQUIS Take 1 tablet (5 mg total) by mouth 2 (two) times daily.   atorvastatin 80 MG tablet Commonly known as: LIPITOR TAKE 1 TABLET (80 MG TOTAL) BY MOUTH DAILY AT 6 PM.   docusate sodium 100 MG capsule Commonly known as: COLACE Take 1 capsule (100 mg total) by mouth 2 (two) times daily as needed for mild constipation or moderate constipation.   feeding supplement (ENSURE ENLIVE) Liqd Take 237 mLs by mouth 2 (two) times daily between meals.   metoprolol tartrate 25 MG tablet Commonly known as: LOPRESSOR Take 1 tablet (25 mg total) by mouth 2 (two) times daily. What changed: how much to take   OPCON-A OP Place 1-2 drops into both eyes 3 (three) times daily as needed (for itchy/irritated eyes.).   pantoprazole 40 MG tablet Commonly known as: PROTONIX TAKE 1 TABLET BY MOUTH EVERY DAY   polyethylene glycol 17 g packet Commonly known as: MIRALAX / GLYCOLAX Take 17 g by mouth daily. Hold if >2 bowel movements /day   PRESERVISION AREDS 2+MULTI VIT PO Take 1 tablet by mouth 2 (two) times daily.   sertraline 100 MG tablet Commonly known as: ZOLOFT Take 1 tablet (100 mg total) by mouth daily.   traMADol 50 MG tablet Commonly known as: ULTRAM Take 1 tablet (50 mg total) by mouth every 8 (eight) hours as needed for moderate pain or severe pain. What changed:  when to take this reasons to take this            Discharge Care Instructions  (From admission, onward)         Start     Ordered   04/01/20 0000  Discharge wound care:       Comments: Per Orthopedic Dr Burna SisKrazinski's instructions   04/01/20 1429           If you experience worsening of your admission symptoms, develop shortness of breath, life threatening emergency, suicidal or homicidal thoughts you must seek medical attention immediately by calling 911 or calling your MD immediately  if symptoms less severe.  You Must read complete instructions/literature along with all the possible adverse reactions/side effects for all the Medicines you take and that have been prescribed to you. Take any new Medicines after you have completely understood and accept all the possible adverse reactions/side effects.   Please note  You were cared for by a hospitalist during your hospital stay. If you have any questions about your discharge medications or the care you received while you were in the hospital after you are discharged, you can call the unit and asked to speak with the hospitalist on call if the hospitalist that took care of you is not available. Once you are discharged, your primary care physician will handle any further medical issues. Please note that NO REFILLS for any discharge medications will be authorized once you are discharged, as it is imperative that you return to your primary care physician (or establish a relationship  with a primary care physician if you do not have one) for your aftercare needs so that they can reassess your need for medications and monitor your lab values. Today   SUBJECTIVE   I need to get off this bed pan--had some BM with enema  VITAL SIGNS:  Blood pressure 135/87, pulse 90, temperature 98.2 F (36.8 C), temperature source Oral, resp. rate 19, height 5\' 6"  (1.676 m), weight 98.1 kg, SpO2 95 %.  I/O:    Intake/Output Summary (Last 24 hours) at 04/02/2020 0843 Last data filed at 04/02/2020 0553 Gross per 24 hour  Intake 490 ml  Output 2100 ml  Net -1610 ml    PHYSICAL EXAMINATION:  GENERAL:  86 y.o.-year-old patient lying in the bed with no acute distress. Obese Pallor+ EYES: Pupils equal, round,  reactive to light and accommodation. No scleral icterus.  HEENT: Head atraumatic, normocephalic. Oropharynx and nasopharynx clear.   LUNGS: diminish breath sounds bilaterally bases, no wheezing, rales,rhonchi or crepitation. No use of accessory muscles of respiration.  CARDIOVASCULAR: S1, S2 normal. No murmurs, rubs, or gallops.  ABDOMEN: Soft, non-tender, non-distended.obese  Bowel sounds present. No organomegaly or mass.  EXTREMITIES: No pedal edema, cyanosis, or clubbing. Right hip dressing + NEUROLOGIC: Cranial nerves II through XII are intact. Muscle strength 5/5 in all extremities. Sensation intact. Gait not checked.  PSYCHIATRIC: patient is alert and oriented x 3.  SKIN: No obvious rash, lesion, or ulcer.   DATA REVIEW:   CBC  Recent Labs  Lab 03/31/20 0450  WBC 12.4*  HGB 7.8*  HCT 22.6*  PLT 93*    Chemistries  Recent Labs  Lab 03/27/20 2122 03/28/20 0040 03/30/20 1118 03/31/20 0450 04/01/20 0803  NA 138   < >   < >  --  143  K 4.5   < >   < >  --  4.3  CL 108   < >   < >  --  110  CO2 26   < >   < >  --  27  GLUCOSE 189*   < >   < >  --  101*  BUN 24*   < >   < >  --  33*  CREATININE 0.82   < >   < >  --  0.75  CALCIUM 7.5*   < >   < >  --  8.6*  MG  --    < >  --  2.2  --   AST 30  --   --   --   --   ALT 14  --   --   --   --   ALKPHOS 34*  --   --   --   --   BILITOT 1.4*  --   --   --   --    < > = values in this interval not displayed.    Microbiology Results   Recent Results (from the past 240 hour(s))  SARS Coronavirus 2 by RT PCR (hospital order, performed in [facility] hospital lab) Nasopharyngeal Nasopharyngeal Swab     Status: None   Collection Time: 03/26/20  5:00 PM   Specimen: Nasopharyngeal Swab  Result Value Ref Range Status   SARS Coronavirus 2 NEGATIVE NEGATIVE Final    Comment: (NOTE) SARS-CoV-2 target nucleic acids are NOT DETECTED.  The SARS-CoV-2 RNA is generally detectable in upper and lower respiratory specimens during the  acute phase of infection. The lowest concentration of  SARS-CoV-2 viral copies this assay can detect is 250 copies / mL. A negative result does not preclude SARS-CoV-2 infection and should not be used as the sole basis for treatment or other patient management decisions.  A negative result may occur with improper specimen collection / handling, submission of specimen other than nasopharyngeal swab, presence of viral mutation(s) within the areas targeted by this assay, and inadequate number of viral copies (<250 copies / mL). A negative result must be combined with clinical observations, patient history, and epidemiological information.  Fact Sheet for Patients:   BoilerBrush.com.cy  Fact Sheet for Healthcare Providers: https://pope.com/  This test is not yet approved or  cleared by the [place] FDA and has been authorized for detection and/or diagnosis of SARS-CoV-2 by FDA under an Emergency Use Authorization (EUA).  This EUA will remain in effect (meaning this test can be used) for the duration of the COVID-19 declaration under Section 564(b)(1) of the Act, 21 U.S.C. section 360bbb-3(b)(1), unless the authorization is terminated or revoked sooner.  Performed at [facility], [address] [place], [place] [address]   Surgical pcr screen     Status: Abnormal   Collection Time: 03/27/20  4:42 AM  Result Value Ref Range Status   MRSA, PCR NEGATIVE NEGATIVE Final   Staphylococcus aureus POSITIVE (A) NEGATIVE Final    Comment: (NOTE) The Xpert SA Assay (FDA approved for NASAL specimens in patients 12 years of age and older), is one component of a comprehensive surveillance program. It is not intended to diagnose infection nor to guide or monitor treatment. Performed at [facility], [address] [place], [place] [address]   CULTURE, BLOOD (ROUTINE X 2) w Reflex to ID Panel     Status: None   Collection  Time: 03/27/20  8:57 PM   Specimen: BLOOD LEFT HAND  Result Value Ref Range Status   Specimen Description BLOOD LEFT HAND  Final   Special Requests   Final    BOTTLES DRAWN AEROBIC ONLY Blood Culture results may not be optimal due to an inadequate volume of blood received in culture bottles   Culture   Final    NO GROWTH 5 DAYS Performed at [facility], [address] [place], [place] [address]    Report Status 04/01/2020 FINAL  Final  CULTURE, BLOOD (ROUTINE X 2) w Reflex to ID Panel     Status: None   Collection Time: 03/27/20  9:02 PM   Specimen: BLOOD RIGHT HAND  Result Value Ref Range Status   Specimen Description BLOOD RIGHT HAND  Final   Special Requests   Final    BOTTLES DRAWN AEROBIC ONLY Blood Culture adequate volume   Culture   Final    NO GROWTH 5 DAYS Performed at [facility], [address] [place], [place] [address]    Report Status 04/01/2020 FINAL  Final  Urine Culture     Status: Abnormal   Collection Time: 03/27/20 11:59 PM   Specimen: Urine, Clean Catch  Result Value Ref Range Status   Specimen Description   Final    URINE, CLEAN CATCH Performed at [facility], [address] [place], [place] [address]    Special Requests   Final    NONE Performed at [facility], [address] [place], [place] [address]    Culture 50,000 COLONIES/mL KLEBSIELLA PNEUMONIAE (A)  Final   Report Status 03/30/2020 FINAL  Final   Organism ID, Bacteria KLEBSIELLA PNEUMONIAE (A)  Final      Susceptibility   Klebsiella pneumoniae - MIC*    AMPICILLIN RESISTANT Resistant     CEFAZOLIN <=4 SENSITIVE Sensitive     CEFTRIAXONE <=0.25 SENSITIVE Sensitive     CIPROFLOXACIN <=0.25 SENSITIVE Sensitive     GENTAMICIN <=1 SENSITIVE Sensitive     IMIPENEM <=0.25 SENSITIVE Sensitive     NITROFURANTOIN 32 SENSITIVE Sensitive     TRIMETH/SULFA <=20 SENSITIVE Sensitive     AMPICILLIN/SULBACTAM 4 SENSITIVE Sensitive     PIP/TAZO <=4  SENSITIVE Sensitive     * 50,000 COLONIES/mL KLEBSIELLA PNEUMONIAE  SARS Coronavirus 2 by RT PCR (hospital order, performed in [facility] hospital lab) Nasopharyngeal Nasopharyngeal Swab     Status: None   Collection Time: 04/01/20  5:52 PM   Specimen: Nasopharyngeal Swab  Result Value Ref Range Status   SARS Coronavirus 2 NEGATIVE NEGATIVE Final    Comment: (NOTE) SARS-CoV-2 target nucleic acids are NOT DETECTED.  The SARS-CoV-2 RNA is generally detectable in upper and lower respiratory specimens during the acute phase of infection. The lowest concentration of SARS-CoV-2 viral copies this assay can detect is 250 copies / mL. A negative result does not preclude SARS-CoV-2 infection and should not be used as the sole basis for treatment or other patient management decisions.  A negative result may occur with improper specimen collection / handling, submission of specimen other than nasopharyngeal swab, presence of viral mutation(s) within the areas targeted by this assay, and inadequate number of viral copies (<250 copies / mL). A negative result must be combined with clinical observations, patient history, and epidemiological information.  Fact Sheet for Patients:   BoilerBrush.com.cy  Fact Sheet for Healthcare Providers: https://pope.com/  This test is not yet approved or  cleared by the [place] FDA and has been authorized for detection and/or diagnosis of SARS-CoV-2 by FDA under an Emergency Use Authorization (EUA).  This EUA will remain in effect (meaning this test can be used) for the duration of the COVID-19 declaration under Section 564(b)(1) of the Act, 21 U.S.C. section 360bbb-3(b)(1), unless the authorization is terminated or revoked sooner.  Performed at [facility], [address] [place], [place] [address]     RADIOLOGY:  No results found.   CODE STATUS:     Code Status Orders  (From  admission, onward)         Start     Ordered   03/26/20 2113  Full code  Continuous        03/26/20 2112        Code Status History    Date Active Date Inactive Code Status Order ID Comments User Context   01/18/2018 0113 01/23/2018 1917 Full Code [id number]  Cammy Copa, MD Inpatient   01/11/2018 1358 01/11/2018 1911 Full Code [id number]  Annice Needy, MD Inpatient   12/22/2017 1940 12/26/2017 1525 Full Code [id number]  Allayne Butcher, PA-C Inpatient   12/21/2017 0207 12/22/2017 1839 Full Code [id number]  Cammy Copa, MD ED   Advance Care Planning Activity    Advance Directive Documentation     Most Recent Value  Type of Advance Directive Healthcare Power of Attorney, Living will  Pre-existing out of facility DNR order (yellow form or pink MOST form) --  "MOST" Form in Place? --       TOTAL TIME TAKING CARE OF THIS PATIENT: *35* minutes.    Enedina Finner M.D  [facility]    CC: Primary care physician; Gracelyn Nurse,  MD     

## 2020-04-02 NOTE — Progress Notes (Signed)
  Subjective:  POD #6 s/p medullary fixation of right femur fracture.   Patient reports right hip pain as mild to moderate.  Patient had a bowel movement.  She is being discharged to skilled nursing facility today.  Her daughter is at the bedside along with a family friend.  Objective:   VITALS:   Vitals:   04/01/20 2010 04/02/20 0421 04/02/20 0753 04/02/20 1130  BP: (!) 157/78 131/85 135/87 121/83  Pulse: 96 73 90 99  Resp: 20  19 18   Temp: 99 F (37.2 C) 97.8 F (36.6 C) 98.2 F (36.8 C) 98 F (36.7 C)  TempSrc: Oral Oral Oral Oral  SpO2: 94%  95% 94%  Weight:  98.1 kg    Height:        PHYSICAL EXAM: Right lower extremity: I personally changed the patient's dressings today. Neurovascular intact Sensation intact distally Intact pulses distally Dorsiflexion/Plantar flexion intact Incision: no drainage No cellulitis present Compartment soft  LABS  Results for orders placed or performed during the hospital encounter of 03/26/20 (from the past 24 hour(s))  Glucose, capillary     Status: Abnormal   Collection Time: 04/01/20  4:44 PM  Result Value Ref Range   Glucose-Capillary 119 (H) 70 - 99 mg/dL  SARS Coronavirus 2 by RT PCR (hospital order, performed in [facility] hospital lab) Nasopharyngeal Nasopharyngeal Swab     Status: None   Collection Time: 04/01/20  5:52 PM   Specimen: Nasopharyngeal Swab  Result Value Ref Range   SARS Coronavirus 2 NEGATIVE NEGATIVE  Glucose, capillary     Status: Abnormal   Collection Time: 04/01/20  7:42 PM  Result Value Ref Range   Glucose-Capillary 111 (H) 70 - 99 mg/dL  Glucose, capillary     Status: Abnormal   Collection Time: 04/01/20 11:41 PM  Result Value Ref Range   Glucose-Capillary 135 (H) 70 - 99 mg/dL  Glucose, capillary     Status: Abnormal   Collection Time: 04/02/20  4:26 AM  Result Value Ref Range   Glucose-Capillary 112 (H) 70 - 99 mg/dL  Glucose, capillary     Status: Abnormal   Collection Time: 04/02/20  7:54 AM   Result Value Ref Range   Glucose-Capillary 121 (H) 70 - 99 mg/dL  Glucose, capillary     Status: Abnormal   Collection Time: 04/02/20 11:31 AM  Result Value Ref Range   Glucose-Capillary 115 (H) 70 - 99 mg/dL    No results found.  Assessment/Plan: 6 Days Post-Op   Active Problems:   Closed displaced fracture of proximal epiphysis of right femur (HCC)   Closed fracture of right femur (HCC)   Acute respiratory failure with hypoxia (HCC)   Acute blood loss anemia  The patient is stable.  She is improving postop.  Patient may be weightbearing as tolerated on the right lower extremity.  She should continue Eliquis per medicine.  Patient will follow up with me in 10 to 14 days for wound check and staple removal.  Call [facility] in [place] at [phone] for an appointment.  Patient should continue physical therapy for hip range of motion, lower extremity strengthening and gait training.    [phone] , MD 04/02/2020, 1:36 PM

## 2020-04-02 NOTE — Consult Note (Signed)
PHARMACY CONSULT NOTE  Pharmacy Consult for Electrolyte Monitoring and Replacement   Recent Labs: Potassium (mmol/L)  Date Value  04/01/2020 4.3   Magnesium (mg/dL)  Date Value  69/45/0388 2.2   Calcium (mg/dL)  Date Value  82/80/0349 8.6 (L)   Albumin (g/dL)  Date Value  17/91/5056 2.9 (L)  09/28/2017 3.9   Phosphorus (mg/dL)  Date Value  97/94/8016 2.7   Sodium (mmol/L)  Date Value  04/01/2020 143  09/28/2017 143   Corrected Ca: 9.26 mg/dL  Assessment: 86 y.o. Female w/ PMH CAD, NSTEMI s/p stents, HTN admitted 03/26/20 with Right displaced Femoral Shaft Fracture, s/p Intramedullary Fixation of the right femoral artery shaft fracture on 03/27/20.  Goal of Therapy:  Potassium 4.0 - 5.1 mmol/L Magnesium 2.0 - 2.4 mg/dL All Other Electrolytes WNL  Plan:  No new labs. No replacement at this time. Will f/u with electrolytes with AM labs.    Ronnald Ramp ,PharmD, BCPS Clinical Pharmacist 04/02/2020 8:08 AM

## 2020-04-03 DIAGNOSIS — D649 Anemia, unspecified: Secondary | ICD-10-CM | POA: Diagnosis not present

## 2020-04-03 DIAGNOSIS — M80051D Age-related osteoporosis with current pathological fracture, right femur, subsequent encounter for fracture with routine healing: Secondary | ICD-10-CM | POA: Diagnosis not present

## 2020-04-03 DIAGNOSIS — I480 Paroxysmal atrial fibrillation: Secondary | ICD-10-CM | POA: Diagnosis not present

## 2020-04-03 DIAGNOSIS — I2510 Atherosclerotic heart disease of native coronary artery without angina pectoris: Secondary | ICD-10-CM | POA: Diagnosis not present

## 2020-04-03 NOTE — Progress Notes (Signed)
Patient was left intubated and taken directly to ICU instead of PACU.

## 2020-04-10 DIAGNOSIS — Z09 Encounter for follow-up examination after completed treatment for conditions other than malignant neoplasm: Secondary | ICD-10-CM | POA: Diagnosis not present

## 2020-04-11 MED FILL — Vasopressin IV Soln 20 Unit/ML (For IV Infusion): INTRAVENOUS | Qty: 1 | Status: AC

## 2020-04-11 MED FILL — Sodium Chloride IV Soln 0.9%: INTRAVENOUS | Qty: 100 | Status: AC

## 2020-04-25 DIAGNOSIS — Z09 Encounter for follow-up examination after completed treatment for conditions other than malignant neoplasm: Secondary | ICD-10-CM | POA: Diagnosis not present

## 2020-05-11 ENCOUNTER — Other Ambulatory Visit: Payer: Self-pay

## 2020-05-11 ENCOUNTER — Emergency Department
Admission: EM | Admit: 2020-05-11 | Discharge: 2020-05-11 | Disposition: A | Discharge status: Home / Self Care | Payer: PPO | Attending: Emergency Medicine | Admitting: Emergency Medicine

## 2020-05-11 ENCOUNTER — Emergency Department: Payer: PPO

## 2020-05-11 DIAGNOSIS — Y999 Unspecified external cause status: Secondary | ICD-10-CM | POA: Diagnosis not present

## 2020-05-11 DIAGNOSIS — S8002XA Contusion of left knee, initial encounter: Secondary | ICD-10-CM | POA: Diagnosis not present

## 2020-05-11 DIAGNOSIS — W1839XA Other fall on same level, initial encounter: Secondary | ICD-10-CM | POA: Diagnosis not present

## 2020-05-11 DIAGNOSIS — I2510 Atherosclerotic heart disease of native coronary artery without angina pectoris: Secondary | ICD-10-CM | POA: Insufficient documentation

## 2020-05-11 DIAGNOSIS — W19XXXA Unspecified fall, initial encounter: Secondary | ICD-10-CM

## 2020-05-11 DIAGNOSIS — Y939 Activity, unspecified: Secondary | ICD-10-CM | POA: Insufficient documentation

## 2020-05-11 DIAGNOSIS — Y929 Unspecified place or not applicable: Secondary | ICD-10-CM | POA: Diagnosis not present

## 2020-05-11 DIAGNOSIS — I10 Essential (primary) hypertension: Secondary | ICD-10-CM | POA: Insufficient documentation

## 2020-05-11 DIAGNOSIS — M7989 Other specified soft tissue disorders: Secondary | ICD-10-CM | POA: Diagnosis not present

## 2020-05-11 DIAGNOSIS — Z79899 Other long term (current) drug therapy: Secondary | ICD-10-CM | POA: Insufficient documentation

## 2020-05-11 DIAGNOSIS — S8012XA Contusion of left lower leg, initial encounter: Secondary | ICD-10-CM | POA: Diagnosis not present

## 2020-05-11 DIAGNOSIS — Z7901 Long term (current) use of anticoagulants: Secondary | ICD-10-CM | POA: Insufficient documentation

## 2020-05-11 NOTE — ED Triage Notes (Signed)
Pt comes POV with left knee/shin pain, bruising, redness after a fall on Friday. Area is swollen. Pulses good.

## 2020-05-11 NOTE — ED Provider Notes (Signed)
[facility] Emergency Department Provider Note  ____________________________________________  Time seen: Approximately 9:02 PM  I have reviewed the triage vital signs and the nursing notes.   HISTORY  Chief Complaint Knee Pain    HPI Alison Russo is a 86 y.o. female who presents emergency department with her daughter for complaint of ecchymosis to the left shin.  Patient states that she broke her right femur and has been going through physical therapy to regain strength.  She is having to use a walker and she believes that she tripped over the walker landing on her left leg.  Patient has had no injury to the right side whatsoever.  She denies any pain to the left side either.  Injury occurred 2 days ago.  She was primarily concerned given the amount of bruising to the shin.  Patient denies any other injury or complaint.  She did not hit her head or lose consciousness.  She denies any hip pain, knee pain, ankle pain or even tibial pain to the left side.  Medical history as described below.  No complaints of chronic medical issues.         Past Medical History:  Diagnosis Date  . Anginal pain (HCC)   . CAD (coronary artery disease)   . Heart murmur   . Hypertension   . Mood disorder (HCC)    mostly anxiety  . NSTEMI (non-ST elevated myocardial infarction) (HCC) 12/22/2017  . OSA on CPAP   . PE (pulmonary embolism)    recurrent    Patient Active Problem List   Diagnosis Date Noted  . Closed fracture of right femur (HCC)   . Acute respiratory failure with hypoxia (HCC)   . Acute blood loss anemia   . Closed displaced fracture of proximal epiphysis of right femur (HCC) 03/26/2020  . Sleep apnea 11/29/2018  . Gastroesophageal reflux disease without esophagitis 02/20/2018  . Mood disorder (HCC)   . Hematoma 02/01/2018  . Lymphedema 02/01/2018  . Leg pain 01/09/2018  . Atrial fibrillation (HCC) 12/28/2017  . Heart murmur 12/28/2017  . Tachycardia  12/28/2017  . CAD (coronary artery disease) 12/25/2017  . Essential hypertension 12/25/2017  . Hyperlipidemia 12/25/2017  . History of pulmonary embolism 12/25/2017  . Anemia 12/25/2017  . Chronic anticoagulation 12/22/2017  . NSTEMI (non-ST elevated myocardial infarction) (HCC) 12/21/2017  . Contusion of right knee 02/27/2016  . 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 BREAST CYST ASPIRATION Right    neg  . CARDIAC CATHETERIZATION    . CATARACT EXTRACTION, BILATERAL    . COLONOSCOPY    . CORONARY STENT INTERVENTION N/A 12/23/2017   Procedure: CORONARY STENT INTERVENTION;  Surgeon: SwazilandJordan, Peter M, MD;  [facility];  Service: Cardiovascular;  Laterality: N/A;  . EYE SURGERY Left    "blood vessel busted behind my eye"  . EYE SURGERY Right    ?vitrectomy  . HYSTEROSCOPY WITH D & C N/A 10/25/2016   Procedure: DILATATION AND CURETTAGE /HYSTEROSCOPY, POLYPECTOMY;  Surgeon: Christeen DouglasBethany Beasley, MD;  Location: [facility] ORS;  Service: Gynecology;  Laterality: N/A;  . INGUINAL HERNIA REPAIR Right 1950s  . INTRAMEDULLARY (IM) NAIL INTERTROCHANTERIC Right 03/27/2020   Procedure: INTRAMEDULLARY (IM) NAIL INTERTROCHANTRIC;  Surgeon: Juanell FairlyKrasinski, Kevin, MD;  Location: [facility] ORS;  Service: Orthopedics;  Laterality: Right;  . LEFT HEART CATH AND CORONARY ANGIOGRAPHY N/A 12/21/2017   Procedure: LEFT HEART CATH AND CORONARY ANGIOGRAPHY possible PCI and stent;  Surgeon: Alwyn Pea, MD;  Location: [facility] INVASIVE CV LAB;  Service: Cardiovascular;  Laterality: N/A;  . LOWER EXTREMITY ANGIOGRAPHY Right 01/11/2018   Procedure: LOWER EXTREMITY ANGIOGRAPHY;  Surgeon: Annice Needy, MD;  Location: [facility] INVASIVE CV LAB;  Service: Cardiovascular;  Laterality: Right;  . PSEUDOANERYSM COMPRESSION Left 01/20/2018   Procedure: PSEUDOANERYSM COMPRESSION;  Surgeon: Renford Dills, MD;  Location: [facility] INVASIVE CV LAB;  Service: Cardiovascular;  Laterality: Left;   . TONSILLECTOMY      Prior to Admission medications   Medication Sig Start Date End Date Taking? Authorizing Provider  acetaminophen (TYLENOL) 325 MG tablet Take 2 tablets (650 mg total) by mouth every 6 (six) hours as needed for mild pain (or Fever >/= 101). 01/23/18   Enid Baas, MD  apixaban (ELIQUIS) 5 MG TABS tablet Take 1 tablet (5 mg total) by mouth 2 (two) times daily. 04/01/20   Enedina Finner, MD  atorvastatin (LIPITOR) 80 MG tablet TAKE 1 TABLET (80 MG TOTAL) BY MOUTH DAILY AT 6 PM. 03/03/18   Lyndon Code, MD  docusate sodium (COLACE) 100 MG capsule Take 1 capsule (100 mg total) by mouth 2 (two) times daily as needed for mild constipation or moderate constipation. 04/01/20   Enedina Finner, MD  feeding supplement, ENSURE ENLIVE, (ENSURE ENLIVE) LIQD Take 237 mLs by mouth 2 (two) times daily between meals. 01/24/18   Enid Baas, MD  metoprolol tartrate (LOPRESSOR) 25 MG tablet Take 1 tablet (25 mg total) by mouth 2 (two) times daily. 04/01/20   Enedina Finner, MD  Multiple Vitamins-Minerals (PRESERVISION AREDS 2+MULTI VIT PO) Take 1 tablet by mouth 2 (two) times daily.    [provider]  Naphazoline-Pheniramine (OPCON-A OP) Place 1-2 drops into both eyes 3 (three) times daily as needed (for itchy/irritated eyes.).    [provider]  pantoprazole (PROTONIX) 40 MG tablet TAKE 1 TABLET BY MOUTH EVERY DAY Patient not taking: Reported on 03/26/2020 02/06/18   Carlean Jews, NP  polyethylene glycol (MIRALAX / GLYCOLAX) packet Take 17 g by mouth daily. Hold if >2 bowel movements /day 01/23/18   Enid Baas, MD  sertraline (ZOLOFT) 100 MG tablet Take 1 tablet (100 mg total) by mouth daily. 03/20/18   Carlean Jews, NP  traMADol (ULTRAM) 50 MG tablet Take 1 tablet (50 mg total) by mouth every 8 (eight) hours as needed for moderate pain or severe pain. 04/02/20   Enedina Finner, MD    Allergies Patient has no known allergies.  Family History  Problem Relation Age of  Onset  . Breast cancer Mother 100  . AAA (abdominal aortic aneurysm) Father        ruptured  . Coronary artery disease Brother     Social History Social History   Tobacco Use  . Smoking status: Never Smoker  . Smokeless tobacco: Never Used  Vaping Use  . Vaping Use: Never used  Substance Use Topics  . Alcohol use: Never  . Drug use: Never     Review of Systems  Constitutional: No fever/chills Eyes: No visual changes. No discharge ENT: No upper respiratory complaints. Cardiovascular: no chest pain. Respiratory: no cough. No SOB. Gastrointestinal: No abdominal pain.  No nausea, no vomiting.  No diarrhea.  No constipation. Musculoskeletal: Left leg ecchymosis following a mechanical fall Skin: Negative for rash, abrasions, lacerations, ecchymosis. Neurological: Negative for headaches, focal weakness or numbness. 10-point ROS otherwise negative.  ____________________________________________   PHYSICAL EXAM:  VITAL SIGNS: ED Triage Vitals [05/11/20 1647]  Enc Vitals Group     BP 124/86     Pulse Rate (!) 106     Resp 18     Temp 98.4 F (36.9 C)     Temp Source Oral     SpO2 96 %     Weight 200 lb (90.7 kg)     Height 5\' 6"  (1.676 m)     Head Circumference      Peak Flow      Pain Score 1     Pain Loc      Pain Edu?      Excl. in GC?      Constitutional: Alert and oriented. Well appearing and in no acute distress. Eyes: Conjunctivae are normal. PERRL. EOMI. Head: Atraumatic. ENT:      Ears:       Nose: No congestion/rhinnorhea.      Mouth/Throat: Mucous membranes are moist.  Neck: No stridor.    Cardiovascular: Normal rate, regular rhythm. Normal S1 and S2.  Good peripheral circulation. Respiratory: Normal respiratory effort without tachypnea or retractions. Lungs CTAB. Good air entry to the bases with no decreased or absent breath sounds. Musculoskeletal: Full range of motion to all extremities. No gross deformities appreciated.  Visualization of the  left lower extremity reveals ecchymosis and soft tissue edema along the anterior aspect of the shin.  No lacerations or abrasions.  No erythema.  Area is not warm to palpation.  No palpable abnormality or cords.  Examination of the left knee and ankle is unremarkable.  Patient is grossly nontender to palpation of the left lower extremity.  Dorsalis pedis pulse intact distally.  Sensation intact distally. Neurologic:  Normal speech and language. No gross focal neurologic deficits are appreciated.  Skin:  Skin is warm, dry and intact. No rash noted. Psychiatric: Mood and affect are normal. Speech and behavior are normal. Patient exhibits appropriate insight and judgement.   ____________________________________________   LABS (all labs ordered are listed, but only abnormal results are displayed)  Labs Reviewed - No data to display ____________________________________________  EKG   ____________________________________________  RADIOLOGY I personally viewed and evaluated these images as part of my medical decision making, as well as reviewing the written report by the radiologist.  DG Tibia/Fibula Left  Result Date: 05/11/2020 CLINICAL DATA:  Recent fall, pain, erythema and bruising in left knee and shin EXAM: LEFT TIBIA AND FIBULA - 2 VIEW COMPARISON:  None. FINDINGS: Diffuse soft tissue swelling. No radiopaque foreign bodies. No fracture. No suspicious focal osseous lesions. Small Achilles and plantar left calcaneal spurs. No evidence of dislocation at the left ankle or left knee on these ease. IMPRESSION: Diffuse soft tissue swelling. No fracture. Small Achilles and plantar left calcaneal spurs. Electronically Signed   By: 05/13/2020 M.D.   On: 05/11/2020 17:27    ____________________________________________    PROCEDURES  Procedure(s) performed:    Procedures    Medications - No data to display   ____________________________________________   INITIAL IMPRESSION /  ASSESSMENT AND PLAN / ED COURSE  Pertinent labs & imaging results that were available during my care of the patient were reviewed by me and considered in my medical decision making (see chart for details).  Review of the [place] CSRS was performed in accordance of the [facility] prior to dispensing any controlled drugs.           Patient's diagnosis is consistent with fall, contusion of the left lower leg.  Patient presented to emergency department after mechanical  fall.  She landed on her left leg.  She has had bruising and edema since this injury.  Examination is reassuring.  No concern for DVT as this is a fresh trauma with ecchymosis.  Imaging reveals no acute fractures.  Patient has good range of motion, no tenderness, pulses and sensation intact.  Patient is  on anticoagulation which contributed to the bruising.  No other injuries during the fall.  She did not strike her head or lose consciousness.  No indication for further work-up at this time.  I discussed concerning signs and symptoms to return to the emergency department for.  At this time no medications.  Follow-up primary care as needed.  Patient is given ED precautions to return to the ED for any worsening or new symptoms.     ____________________________________________  FINAL CLINICAL IMPRESSION(S) / ED DIAGNOSES  Final diagnoses:  Fall  Contusion of left knee and lower leg, initial encounter      NEW MEDICATIONS STARTED DURING THIS VISIT:  ED Discharge Orders    None          This chart was dictated using voice recognition software/Dragon. Despite best efforts to proofread, errors can occur which can change the meaning. Any change was purely unintentional.    Racheal Patches, PA-C 05/11/20 2256    Chesley Noon, MD 05/11/20 2329

## 2020-05-12 DIAGNOSIS — M80051D Age-related osteoporosis with current pathological fracture, right femur, subsequent encounter for fracture with routine healing: Secondary | ICD-10-CM | POA: Diagnosis not present

## 2020-05-12 DIAGNOSIS — G4733 Obstructive sleep apnea (adult) (pediatric): Secondary | ICD-10-CM | POA: Diagnosis not present

## 2020-05-12 DIAGNOSIS — F419 Anxiety disorder, unspecified: Secondary | ICD-10-CM | POA: Diagnosis not present

## 2020-05-12 DIAGNOSIS — I10 Essential (primary) hypertension: Secondary | ICD-10-CM | POA: Diagnosis not present

## 2020-05-12 DIAGNOSIS — R32 Unspecified urinary incontinence: Secondary | ICD-10-CM | POA: Diagnosis not present

## 2020-05-12 DIAGNOSIS — Z85038 Personal history of other malignant neoplasm of large intestine: Secondary | ICD-10-CM | POA: Diagnosis not present

## 2020-05-12 DIAGNOSIS — K219 Gastro-esophageal reflux disease without esophagitis: Secondary | ICD-10-CM | POA: Diagnosis not present

## 2020-05-12 DIAGNOSIS — M6751 Plica syndrome, right knee: Secondary | ICD-10-CM | POA: Diagnosis not present

## 2020-05-12 DIAGNOSIS — F329 Major depressive disorder, single episode, unspecified: Secondary | ICD-10-CM | POA: Diagnosis not present

## 2020-05-12 DIAGNOSIS — D509 Iron deficiency anemia, unspecified: Secondary | ICD-10-CM | POA: Diagnosis not present

## 2020-05-12 DIAGNOSIS — Z86711 Personal history of pulmonary embolism: Secondary | ICD-10-CM | POA: Diagnosis not present

## 2020-05-12 DIAGNOSIS — Z7901 Long term (current) use of anticoagulants: Secondary | ICD-10-CM | POA: Diagnosis not present

## 2020-05-12 DIAGNOSIS — E669 Obesity, unspecified: Secondary | ICD-10-CM | POA: Diagnosis not present

## 2020-05-12 DIAGNOSIS — Z6832 Body mass index (BMI) 32.0-32.9, adult: Secondary | ICD-10-CM | POA: Diagnosis not present

## 2020-05-12 DIAGNOSIS — I252 Old myocardial infarction: Secondary | ICD-10-CM | POA: Diagnosis not present

## 2020-05-12 DIAGNOSIS — I2510 Atherosclerotic heart disease of native coronary artery without angina pectoris: Secondary | ICD-10-CM | POA: Diagnosis not present

## 2020-05-12 DIAGNOSIS — Z9181 History of falling: Secondary | ICD-10-CM | POA: Diagnosis not present

## 2020-05-12 DIAGNOSIS — I480 Paroxysmal atrial fibrillation: Secondary | ICD-10-CM | POA: Diagnosis not present

## 2020-05-15 DIAGNOSIS — R32 Unspecified urinary incontinence: Secondary | ICD-10-CM | POA: Diagnosis not present

## 2020-05-15 DIAGNOSIS — Z6832 Body mass index (BMI) 32.0-32.9, adult: Secondary | ICD-10-CM | POA: Diagnosis not present

## 2020-05-15 DIAGNOSIS — G4733 Obstructive sleep apnea (adult) (pediatric): Secondary | ICD-10-CM | POA: Diagnosis not present

## 2020-05-15 DIAGNOSIS — M80051D Age-related osteoporosis with current pathological fracture, right femur, subsequent encounter for fracture with routine healing: Secondary | ICD-10-CM | POA: Diagnosis not present

## 2020-05-15 DIAGNOSIS — Z85038 Personal history of other malignant neoplasm of large intestine: Secondary | ICD-10-CM | POA: Diagnosis not present

## 2020-05-15 DIAGNOSIS — M6751 Plica syndrome, right knee: Secondary | ICD-10-CM | POA: Diagnosis not present

## 2020-05-15 DIAGNOSIS — D509 Iron deficiency anemia, unspecified: Secondary | ICD-10-CM | POA: Diagnosis not present

## 2020-05-15 DIAGNOSIS — Z7901 Long term (current) use of anticoagulants: Secondary | ICD-10-CM | POA: Diagnosis not present

## 2020-05-15 DIAGNOSIS — K219 Gastro-esophageal reflux disease without esophagitis: Secondary | ICD-10-CM | POA: Diagnosis not present

## 2020-05-15 DIAGNOSIS — I10 Essential (primary) hypertension: Secondary | ICD-10-CM | POA: Diagnosis not present

## 2020-05-15 DIAGNOSIS — I480 Paroxysmal atrial fibrillation: Secondary | ICD-10-CM | POA: Diagnosis not present

## 2020-05-15 DIAGNOSIS — Z9181 History of falling: Secondary | ICD-10-CM | POA: Diagnosis not present

## 2020-05-15 DIAGNOSIS — F329 Major depressive disorder, single episode, unspecified: Secondary | ICD-10-CM | POA: Diagnosis not present

## 2020-05-15 DIAGNOSIS — I252 Old myocardial infarction: Secondary | ICD-10-CM | POA: Diagnosis not present

## 2020-05-15 DIAGNOSIS — I2510 Atherosclerotic heart disease of native coronary artery without angina pectoris: Secondary | ICD-10-CM | POA: Diagnosis not present

## 2020-05-15 DIAGNOSIS — E669 Obesity, unspecified: Secondary | ICD-10-CM | POA: Diagnosis not present

## 2020-05-15 DIAGNOSIS — Z86711 Personal history of pulmonary embolism: Secondary | ICD-10-CM | POA: Diagnosis not present

## 2020-05-15 DIAGNOSIS — F419 Anxiety disorder, unspecified: Secondary | ICD-10-CM | POA: Diagnosis not present

## 2020-05-16 ENCOUNTER — Other Ambulatory Visit: Payer: Self-pay

## 2020-05-16 ENCOUNTER — Ambulatory Visit
Admission: RE | Admit: 2020-05-16 | Discharge: 2020-05-16 | Disposition: A | Discharge status: Home / Self Care | Payer: PPO | Source: Ambulatory Visit | Attending: Internal Medicine | Admitting: Internal Medicine

## 2020-05-16 ENCOUNTER — Other Ambulatory Visit (HOSPITAL_COMMUNITY): Payer: Self-pay | Admitting: Internal Medicine

## 2020-05-16 ENCOUNTER — Other Ambulatory Visit (HOSPITAL_COMMUNITY): Payer: Self-pay | Admitting: Psychiatry

## 2020-05-16 ENCOUNTER — Other Ambulatory Visit: Payer: Self-pay | Admitting: Internal Medicine

## 2020-05-16 DIAGNOSIS — M79605 Pain in left leg: Secondary | ICD-10-CM | POA: Diagnosis not present

## 2020-05-16 DIAGNOSIS — R2242 Localized swelling, mass and lump, left lower limb: Secondary | ICD-10-CM | POA: Diagnosis not present

## 2020-05-16 DIAGNOSIS — I517 Cardiomegaly: Secondary | ICD-10-CM | POA: Diagnosis not present

## 2020-05-16 DIAGNOSIS — Z09 Encounter for follow-up examination after completed treatment for conditions other than malignant neoplasm: Secondary | ICD-10-CM | POA: Diagnosis not present

## 2020-05-16 DIAGNOSIS — I2510 Atherosclerotic heart disease of native coronary artery without angina pectoris: Secondary | ICD-10-CM | POA: Diagnosis not present

## 2020-05-16 DIAGNOSIS — I480 Paroxysmal atrial fibrillation: Secondary | ICD-10-CM | POA: Diagnosis not present

## 2020-05-16 DIAGNOSIS — J9811 Atelectasis: Secondary | ICD-10-CM | POA: Diagnosis not present

## 2020-05-16 DIAGNOSIS — I509 Heart failure, unspecified: Secondary | ICD-10-CM | POA: Diagnosis not present

## 2020-05-16 DIAGNOSIS — S72131D Displaced apophyseal fracture of right femur, subsequent encounter for closed fracture with routine healing: Secondary | ICD-10-CM | POA: Diagnosis not present

## 2020-05-16 DIAGNOSIS — R0602 Shortness of breath: Secondary | ICD-10-CM | POA: Diagnosis not present

## 2020-05-16 DIAGNOSIS — J90 Pleural effusion, not elsewhere classified: Secondary | ICD-10-CM | POA: Diagnosis not present

## 2020-05-16 DIAGNOSIS — I10 Essential (primary) hypertension: Secondary | ICD-10-CM | POA: Diagnosis not present

## 2020-05-19 DIAGNOSIS — Z6832 Body mass index (BMI) 32.0-32.9, adult: Secondary | ICD-10-CM | POA: Diagnosis not present

## 2020-05-19 DIAGNOSIS — I10 Essential (primary) hypertension: Secondary | ICD-10-CM | POA: Diagnosis not present

## 2020-05-19 DIAGNOSIS — Z86711 Personal history of pulmonary embolism: Secondary | ICD-10-CM | POA: Diagnosis not present

## 2020-05-19 DIAGNOSIS — M6751 Plica syndrome, right knee: Secondary | ICD-10-CM | POA: Diagnosis not present

## 2020-05-19 DIAGNOSIS — D509 Iron deficiency anemia, unspecified: Secondary | ICD-10-CM | POA: Diagnosis not present

## 2020-05-19 DIAGNOSIS — F419 Anxiety disorder, unspecified: Secondary | ICD-10-CM | POA: Diagnosis not present

## 2020-05-19 DIAGNOSIS — I2510 Atherosclerotic heart disease of native coronary artery without angina pectoris: Secondary | ICD-10-CM | POA: Diagnosis not present

## 2020-05-19 DIAGNOSIS — R32 Unspecified urinary incontinence: Secondary | ICD-10-CM | POA: Diagnosis not present

## 2020-05-19 DIAGNOSIS — S72131D Displaced apophyseal fracture of right femur, subsequent encounter for closed fracture with routine healing: Secondary | ICD-10-CM | POA: Diagnosis not present

## 2020-05-19 DIAGNOSIS — K219 Gastro-esophageal reflux disease without esophagitis: Secondary | ICD-10-CM | POA: Diagnosis not present

## 2020-05-19 DIAGNOSIS — Z7901 Long term (current) use of anticoagulants: Secondary | ICD-10-CM | POA: Diagnosis not present

## 2020-05-19 DIAGNOSIS — G4733 Obstructive sleep apnea (adult) (pediatric): Secondary | ICD-10-CM | POA: Diagnosis not present

## 2020-05-19 DIAGNOSIS — I480 Paroxysmal atrial fibrillation: Secondary | ICD-10-CM | POA: Diagnosis not present

## 2020-05-19 DIAGNOSIS — F329 Major depressive disorder, single episode, unspecified: Secondary | ICD-10-CM | POA: Diagnosis not present

## 2020-05-19 DIAGNOSIS — Z85038 Personal history of other malignant neoplasm of large intestine: Secondary | ICD-10-CM | POA: Diagnosis not present

## 2020-05-19 DIAGNOSIS — I252 Old myocardial infarction: Secondary | ICD-10-CM | POA: Diagnosis not present

## 2020-05-19 DIAGNOSIS — E669 Obesity, unspecified: Secondary | ICD-10-CM | POA: Diagnosis not present

## 2020-05-19 DIAGNOSIS — Z9181 History of falling: Secondary | ICD-10-CM | POA: Diagnosis not present

## 2020-05-19 DIAGNOSIS — M80051D Age-related osteoporosis with current pathological fracture, right femur, subsequent encounter for fracture with routine healing: Secondary | ICD-10-CM | POA: Diagnosis not present

## 2020-05-20 DIAGNOSIS — M80051D Age-related osteoporosis with current pathological fracture, right femur, subsequent encounter for fracture with routine healing: Secondary | ICD-10-CM | POA: Diagnosis not present

## 2020-05-20 DIAGNOSIS — F329 Major depressive disorder, single episode, unspecified: Secondary | ICD-10-CM | POA: Diagnosis not present

## 2020-05-20 DIAGNOSIS — D509 Iron deficiency anemia, unspecified: Secondary | ICD-10-CM | POA: Diagnosis not present

## 2020-05-20 DIAGNOSIS — M6751 Plica syndrome, right knee: Secondary | ICD-10-CM | POA: Diagnosis not present

## 2020-05-20 DIAGNOSIS — I480 Paroxysmal atrial fibrillation: Secondary | ICD-10-CM | POA: Diagnosis not present

## 2020-05-20 DIAGNOSIS — I2510 Atherosclerotic heart disease of native coronary artery without angina pectoris: Secondary | ICD-10-CM | POA: Diagnosis not present

## 2020-05-20 DIAGNOSIS — I10 Essential (primary) hypertension: Secondary | ICD-10-CM | POA: Diagnosis not present

## 2020-05-20 DIAGNOSIS — S8012XA Contusion of left lower leg, initial encounter: Secondary | ICD-10-CM | POA: Diagnosis not present

## 2020-05-22 DIAGNOSIS — R32 Unspecified urinary incontinence: Secondary | ICD-10-CM | POA: Diagnosis not present

## 2020-05-22 DIAGNOSIS — I10 Essential (primary) hypertension: Secondary | ICD-10-CM | POA: Diagnosis not present

## 2020-05-22 DIAGNOSIS — Z7901 Long term (current) use of anticoagulants: Secondary | ICD-10-CM | POA: Diagnosis not present

## 2020-05-22 DIAGNOSIS — D509 Iron deficiency anemia, unspecified: Secondary | ICD-10-CM | POA: Diagnosis not present

## 2020-05-22 DIAGNOSIS — M6751 Plica syndrome, right knee: Secondary | ICD-10-CM | POA: Diagnosis not present

## 2020-05-22 DIAGNOSIS — I480 Paroxysmal atrial fibrillation: Secondary | ICD-10-CM | POA: Diagnosis not present

## 2020-05-22 DIAGNOSIS — E669 Obesity, unspecified: Secondary | ICD-10-CM | POA: Diagnosis not present

## 2020-05-22 DIAGNOSIS — I252 Old myocardial infarction: Secondary | ICD-10-CM | POA: Diagnosis not present

## 2020-05-22 DIAGNOSIS — Z9181 History of falling: Secondary | ICD-10-CM | POA: Diagnosis not present

## 2020-05-22 DIAGNOSIS — Z6832 Body mass index (BMI) 32.0-32.9, adult: Secondary | ICD-10-CM | POA: Diagnosis not present

## 2020-05-22 DIAGNOSIS — Z86711 Personal history of pulmonary embolism: Secondary | ICD-10-CM | POA: Diagnosis not present

## 2020-05-22 DIAGNOSIS — Z85038 Personal history of other malignant neoplasm of large intestine: Secondary | ICD-10-CM | POA: Diagnosis not present

## 2020-05-22 DIAGNOSIS — G4733 Obstructive sleep apnea (adult) (pediatric): Secondary | ICD-10-CM | POA: Diagnosis not present

## 2020-05-22 DIAGNOSIS — F329 Major depressive disorder, single episode, unspecified: Secondary | ICD-10-CM | POA: Diagnosis not present

## 2020-05-22 DIAGNOSIS — K219 Gastro-esophageal reflux disease without esophagitis: Secondary | ICD-10-CM | POA: Diagnosis not present

## 2020-05-22 DIAGNOSIS — F419 Anxiety disorder, unspecified: Secondary | ICD-10-CM | POA: Diagnosis not present

## 2020-05-22 DIAGNOSIS — M80051D Age-related osteoporosis with current pathological fracture, right femur, subsequent encounter for fracture with routine healing: Secondary | ICD-10-CM | POA: Diagnosis not present

## 2020-05-22 DIAGNOSIS — I2510 Atherosclerotic heart disease of native coronary artery without angina pectoris: Secondary | ICD-10-CM | POA: Diagnosis not present

## 2020-05-23 ENCOUNTER — Other Ambulatory Visit: Payer: Self-pay

## 2020-05-23 ENCOUNTER — Emergency Department: Payer: PPO

## 2020-05-23 ENCOUNTER — Encounter: Payer: Self-pay | Admitting: Emergency Medicine

## 2020-05-23 DIAGNOSIS — S8012XA Contusion of left lower leg, initial encounter: Secondary | ICD-10-CM | POA: Diagnosis not present

## 2020-05-23 DIAGNOSIS — W19XXXA Unspecified fall, initial encounter: Secondary | ICD-10-CM | POA: Diagnosis not present

## 2020-05-23 DIAGNOSIS — R32 Unspecified urinary incontinence: Secondary | ICD-10-CM | POA: Diagnosis not present

## 2020-05-23 DIAGNOSIS — Y939 Activity, unspecified: Secondary | ICD-10-CM | POA: Insufficient documentation

## 2020-05-23 DIAGNOSIS — I4891 Unspecified atrial fibrillation: Secondary | ICD-10-CM | POA: Diagnosis not present

## 2020-05-23 DIAGNOSIS — Z9181 History of falling: Secondary | ICD-10-CM | POA: Diagnosis not present

## 2020-05-23 DIAGNOSIS — S8011XA Contusion of right lower leg, initial encounter: Secondary | ICD-10-CM | POA: Insufficient documentation

## 2020-05-23 DIAGNOSIS — M80051D Age-related osteoporosis with current pathological fracture, right femur, subsequent encounter for fracture with routine healing: Secondary | ICD-10-CM | POA: Diagnosis not present

## 2020-05-23 DIAGNOSIS — M6751 Plica syndrome, right knee: Secondary | ICD-10-CM | POA: Diagnosis not present

## 2020-05-23 DIAGNOSIS — I2510 Atherosclerotic heart disease of native coronary artery without angina pectoris: Secondary | ICD-10-CM | POA: Diagnosis not present

## 2020-05-23 DIAGNOSIS — I252 Old myocardial infarction: Secondary | ICD-10-CM | POA: Diagnosis not present

## 2020-05-23 DIAGNOSIS — Z7901 Long term (current) use of anticoagulants: Secondary | ICD-10-CM | POA: Insufficient documentation

## 2020-05-23 DIAGNOSIS — M79662 Pain in left lower leg: Secondary | ICD-10-CM | POA: Diagnosis not present

## 2020-05-23 DIAGNOSIS — I10 Essential (primary) hypertension: Secondary | ICD-10-CM | POA: Insufficient documentation

## 2020-05-23 DIAGNOSIS — R601 Generalized edema: Secondary | ICD-10-CM | POA: Diagnosis not present

## 2020-05-23 DIAGNOSIS — K219 Gastro-esophageal reflux disease without esophagitis: Secondary | ICD-10-CM | POA: Diagnosis not present

## 2020-05-23 DIAGNOSIS — Z6832 Body mass index (BMI) 32.0-32.9, adult: Secondary | ICD-10-CM | POA: Diagnosis not present

## 2020-05-23 DIAGNOSIS — F419 Anxiety disorder, unspecified: Secondary | ICD-10-CM | POA: Diagnosis not present

## 2020-05-23 DIAGNOSIS — S8992XA Unspecified injury of left lower leg, initial encounter: Secondary | ICD-10-CM | POA: Diagnosis present

## 2020-05-23 DIAGNOSIS — I480 Paroxysmal atrial fibrillation: Secondary | ICD-10-CM | POA: Diagnosis not present

## 2020-05-23 DIAGNOSIS — Y929 Unspecified place or not applicable: Secondary | ICD-10-CM | POA: Diagnosis not present

## 2020-05-23 DIAGNOSIS — Z85038 Personal history of other malignant neoplasm of large intestine: Secondary | ICD-10-CM | POA: Diagnosis not present

## 2020-05-23 DIAGNOSIS — Y999 Unspecified external cause status: Secondary | ICD-10-CM | POA: Diagnosis not present

## 2020-05-23 DIAGNOSIS — Z79899 Other long term (current) drug therapy: Secondary | ICD-10-CM | POA: Diagnosis not present

## 2020-05-23 DIAGNOSIS — G4733 Obstructive sleep apnea (adult) (pediatric): Secondary | ICD-10-CM | POA: Diagnosis not present

## 2020-05-23 DIAGNOSIS — M79605 Pain in left leg: Secondary | ICD-10-CM | POA: Diagnosis not present

## 2020-05-23 DIAGNOSIS — Z86711 Personal history of pulmonary embolism: Secondary | ICD-10-CM | POA: Diagnosis not present

## 2020-05-23 DIAGNOSIS — F329 Major depressive disorder, single episode, unspecified: Secondary | ICD-10-CM | POA: Diagnosis not present

## 2020-05-23 DIAGNOSIS — R000 Tachycardia, unspecified: Secondary | ICD-10-CM | POA: Diagnosis not present

## 2020-05-23 DIAGNOSIS — N390 Urinary tract infection, site not specified: Secondary | ICD-10-CM | POA: Diagnosis not present

## 2020-05-23 DIAGNOSIS — D509 Iron deficiency anemia, unspecified: Secondary | ICD-10-CM | POA: Diagnosis not present

## 2020-05-23 DIAGNOSIS — E669 Obesity, unspecified: Secondary | ICD-10-CM | POA: Diagnosis not present

## 2020-05-23 LAB — CBC WITH DIFFERENTIAL/PLATELET
Abs Immature Granulocytes: 0.05 10*3/uL (ref 0.00–0.07)
Basophils Absolute: 0 10*3/uL (ref 0.0–0.1)
Basophils Relative: 0 %
Eosinophils Absolute: 0.2 10*3/uL (ref 0.0–0.5)
Eosinophils Relative: 2 %
HCT: 33.9 % — ABNORMAL LOW (ref 36.0–46.0)
Hemoglobin: 10.7 g/dL — ABNORMAL LOW (ref 12.0–15.0)
Immature Granulocytes: 1 %
Lymphocytes Relative: 12 %
Lymphs Abs: 1.1 10*3/uL (ref 0.7–4.0)
MCH: 30.1 pg (ref 26.0–34.0)
MCHC: 31.6 g/dL (ref 30.0–36.0)
MCV: 95.2 fL (ref 80.0–100.0)
Monocytes Absolute: 0.9 10*3/uL (ref 0.1–1.0)
Monocytes Relative: 9 %
Neutro Abs: 7.5 10*3/uL (ref 1.7–7.7)
Neutrophils Relative %: 76 %
Platelets: 224 10*3/uL (ref 150–400)
RBC: 3.56 MIL/uL — ABNORMAL LOW (ref 3.87–5.11)
RDW: 14.1 % (ref 11.5–15.5)
WBC: 9.8 10*3/uL (ref 4.0–10.5)
nRBC: 0 % (ref 0.0–0.2)

## 2020-05-23 LAB — BASIC METABOLIC PANEL
Anion gap: 7 (ref 5–15)
BUN: 13 mg/dL (ref 8–23)
CO2: 27 mmol/L (ref 22–32)
Calcium: 8.7 mg/dL — ABNORMAL LOW (ref 8.9–10.3)
Chloride: 104 mmol/L (ref 98–111)
Creatinine, Ser: 0.52 mg/dL (ref 0.44–1.00)
GFR calc Af Amer: >60 mL/min (ref >60)
GFR calc non Af Amer: >60 mL/min (ref >60)
Glucose, Bld: 125 mg/dL — ABNORMAL HIGH (ref 70–99)
Potassium: 4.3 mmol/L (ref 3.5–5.1)
Sodium: 138 mmol/L (ref 135–145)

## 2020-05-23 NOTE — ED Triage Notes (Cosign Needed)
Emergency Medicine Provider Triage Evaluation Note  Alison Russo , a 86 y.o. female  was evaluated in triage.  Pt complains of fall and possible UTI. She has also had left lower extremity pain since February that seems to be worse today. She fell today when she was trying to get out of her chair. She denies striking her head or loss of consciousness.   Review of Systems  Positive: Left lower extremity pain Negative: Dysuria, fever  Physical Exam  There were no vitals taken for this visit. Gen:   Awake, no distress   HEENT:  Atraumatic  Resp:  Normal effort  Cardiac:  Normal rate  Abd:   Nondistended, nontender  MSK:   Moves extremities without difficulty  Neuro:  Speech clear   Medical Decision Making  Medically screening exam initiated at 6:15 PM.  Appropriate orders placed.  Alison Russo was informed that the remainder of the evaluation will be completed by another provider, this initial triage assessment does not replace that evaluation, and the importance of remaining in the ED until their evaluation is complete.  Clinical Impression   Acute cystitis Cellulitis LLE   Chinita Pester, FNP 05/23/20 1822

## 2020-05-23 NOTE — ED Triage Notes (Signed)
Pt arrives via ems from home, lives alone, fell this am and ems assisted her and did not need to bring her, daughter was called later this afternoon and pt told her that she wasn't feeling right, daughter did a home test that showed positive for uti, and her dr encouraged the patient to be brought to the ER  123/83, 120p, 95% RA

## 2020-05-23 NOTE — ED Triage Notes (Signed)
Pt reports was trying to get out of her chair and fell. Pt also with swelling to her left leg that is red. Pt reports has mentioned to her MD. Pt also states she took an at home UTI kit and it was positive.

## 2020-05-24 ENCOUNTER — Emergency Department
Admission: EM | Admit: 2020-05-24 | Discharge: 2020-05-24 | Disposition: A | Discharge status: Home / Self Care | Payer: PPO | Attending: Emergency Medicine | Admitting: Emergency Medicine

## 2020-05-24 ENCOUNTER — Emergency Department: Payer: PPO

## 2020-05-24 DIAGNOSIS — M79662 Pain in left lower leg: Secondary | ICD-10-CM | POA: Diagnosis not present

## 2020-05-24 DIAGNOSIS — S8012XA Contusion of left lower leg, initial encounter: Secondary | ICD-10-CM

## 2020-05-24 DIAGNOSIS — I4891 Unspecified atrial fibrillation: Secondary | ICD-10-CM

## 2020-05-24 DIAGNOSIS — N390 Urinary tract infection, site not specified: Secondary | ICD-10-CM

## 2020-05-24 DIAGNOSIS — M79605 Pain in left leg: Secondary | ICD-10-CM | POA: Diagnosis not present

## 2020-05-24 LAB — TROPONIN I (HIGH SENSITIVITY): Troponin I (High Sensitivity): 9 ng/L (ref <18)

## 2020-05-24 LAB — URINALYSIS, COMPLETE (UACMP) WITH MICROSCOPIC
Bilirubin Urine: NEGATIVE
Glucose, UA: NEGATIVE mg/dL
Hgb urine dipstick: NEGATIVE
Ketones, ur: NEGATIVE mg/dL
Leukocytes,Ua: NEGATIVE
Nitrite: POSITIVE — AB
Protein, ur: NEGATIVE mg/dL
Specific Gravity, Urine: 1.019 (ref 1.005–1.030)
pH: 5 (ref 5.0–8.0)

## 2020-05-24 LAB — LACTIC ACID, PLASMA: Lactic Acid, Venous: 1.7 mmol/L (ref 0.5–1.9)

## 2020-05-24 MED ORDER — DILTIAZEM HCL 25 MG/5ML IV SOLN
10.0000 mg | Freq: Once | INTRAVENOUS | Status: AC
  Administered 2020-05-24: 10 mg via INTRAVENOUS
  Filled 2020-05-24: qty 5

## 2020-05-24 MED ORDER — SODIUM CHLORIDE 0.9 % IV SOLN
1.0000 g | Freq: Once | INTRAVENOUS | Status: AC
  Administered 2020-05-24: 1 g via INTRAVENOUS
  Filled 2020-05-24: qty 10

## 2020-05-24 MED ORDER — CEPHALEXIN 250 MG PO CAPS: 250.0000 mg | ORAL_CAPSULE | Freq: Three times a day (TID) | ORAL | 0 refills | Status: DC

## 2020-05-24 MED ORDER — METOPROLOL TARTRATE 25 MG PO TABS
25.0000 mg | ORAL_TABLET | Freq: Once | ORAL | Status: AC
  Administered 2020-05-24: 25 mg via ORAL
  Filled 2020-05-24: qty 1

## 2020-05-24 NOTE — ED Notes (Signed)
Ace wrap applied to LLE.

## 2020-05-24 NOTE — ED Notes (Signed)
US doppler at bedside

## 2020-05-24 NOTE — Discharge Instructions (Signed)
1.  Take antibiotics as prescribed (Keflex 250mg  3 times daily x7 days). 2.  Wrap your leg to compress the area of hematoma.  You may remove Ace wrap to bathe. 3.  Ask your cardiologist about restarting your Eliquis and wearing compression stockings as recommended by the surgeon for your hematoma. 4.  Return to the ER for worsening symptoms, persistent vomiting, difficulty breathing or other concerns.

## 2020-05-24 NOTE — ED Provider Notes (Signed)
[facility] Emergency Department Provider Note   ____________________________________________   First MD Initiated Contact with Patient 05/24/20 [phone]     (approximate)  I have reviewed the triage vital signs and the nursing notes.   HISTORY  Chief Complaint Fall and Urinary Tract Infection    HPI Alison Russo is a 86 y.o. female who presents to the ED from home with a chief complaint of fall and UTI.  Patient had a fall 2 weeks ago with left leg pain and swelling.  Had an ultrasound on 05/16/2020 which was negative for DVT but positive for complex hematoma.  She was seen by Dr. Maia Plan from general surgery on 05/20/2020 for the hematoma and prescribed compression stockings.  States she was trying to get out of her chair today, lost her balance and fell.  Denies injury.  Denies striking head or LOC.  Does take Eliquis for PE.  Took an at home test for urinary symptoms and states it was positive for UTI.  Denies fever, cough, chest pain, shortness of breath, abdominal pain, nausea, vomiting or dizziness.     Past Medical History:  Diagnosis Date  . Anginal pain (HCC)   . CAD (coronary artery disease)   . Heart murmur   . Hypertension   . Mood disorder (HCC)    mostly anxiety  . NSTEMI (non-ST elevated myocardial infarction) (HCC) 12/22/2017  . OSA on CPAP   . PE (pulmonary embolism)    recurrent    Patient Active Problem List   Diagnosis Date Noted  . Closed fracture of right femur (HCC)   . Acute respiratory failure with hypoxia (HCC)   . Acute blood loss anemia   . Closed displaced fracture of proximal epiphysis of right femur (HCC) 03/26/2020  . Sleep apnea 11/29/2018  . Gastroesophageal reflux disease without esophagitis 02/20/2018  . Mood disorder (HCC)   . Hematoma 02/01/2018  . Lymphedema 02/01/2018  . Leg pain 01/09/2018  . Atrial fibrillation (HCC) 12/28/2017  . Heart murmur 12/28/2017  . Tachycardia 12/28/2017  . CAD (coronary  artery disease) 12/25/2017  . Essential hypertension 12/25/2017  . Hyperlipidemia 12/25/2017  . History of pulmonary embolism 12/25/2017  . Anemia 12/25/2017  . Chronic anticoagulation 12/22/2017  . NSTEMI (non-ST elevated myocardial infarction) (HCC) 12/21/2017  . Contusion of right knee 02/27/2016  . 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 BREAST CYST ASPIRATION Right    neg  . CARDIAC CATHETERIZATION    . CATARACT EXTRACTION, BILATERAL    . COLONOSCOPY    . CORONARY STENT INTERVENTION N/A 12/23/2017   Procedure: CORONARY STENT INTERVENTION;  Surgeon: Swaziland, Peter M, MD;  Location: [facility] INVASIVE CV LAB;  Service: Cardiovascular;  Laterality: N/A;  . EYE SURGERY Left    "blood vessel busted behind my eye"  . EYE SURGERY Right    ?vitrectomy  . HYSTEROSCOPY WITH D & C N/A 10/25/2016   Procedure: DILATATION AND CURETTAGE /HYSTEROSCOPY, POLYPECTOMY;  Surgeon: Christeen Douglas, MD;  Location: [facility] ORS;  Service: Gynecology;  Laterality: N/A;  . INGUINAL HERNIA REPAIR Right 1950s  . INTRAMEDULLARY (IM) NAIL INTERTROCHANTERIC Right 03/27/2020   Procedure: INTRAMEDULLARY (IM) NAIL INTERTROCHANTRIC;  Surgeon: Juanell Fairly, MD;  Location: [facility] ORS;  Service: Orthopedics;  Laterality: Right;  . LEFT HEART CATH AND CORONARY ANGIOGRAPHY N/A 12/21/2017   Procedure: LEFT HEART CATH AND CORONARY ANGIOGRAPHY possible PCI and stent;  Surgeon: Alwyn Pea, MD;  Location: [facility]  INVASIVE CV LAB;  Service: Cardiovascular;  Laterality: N/A;  . LOWER EXTREMITY ANGIOGRAPHY Right 01/11/2018   Procedure: LOWER EXTREMITY ANGIOGRAPHY;  Surgeon: Annice Needy, MD;  Location: [facility] INVASIVE CV LAB;  Service: Cardiovascular;  Laterality: Right;  . PSEUDOANERYSM COMPRESSION Left 01/20/2018   Procedure: PSEUDOANERYSM COMPRESSION;  Surgeon: Renford Dills, MD;  Location: [facility] INVASIVE CV LAB;  Service: Cardiovascular;  Laterality: Left;  . TONSILLECTOMY      Prior  to Admission medications   Medication Sig Start Date End Date Taking? Authorizing Provider  acetaminophen (TYLENOL) 325 MG tablet Take 2 tablets (650 mg total) by mouth every 6 (six) hours as needed for mild pain (or Fever >/= 101). 01/23/18   Enid Baas, MD  apixaban (ELIQUIS) 5 MG TABS tablet Take 1 tablet (5 mg total) by mouth 2 (two) times daily. 04/01/20   Enedina Finner, MD  atorvastatin (LIPITOR) 80 MG tablet TAKE 1 TABLET (80 MG TOTAL) BY MOUTH DAILY AT 6 PM. 03/03/18   Lyndon Code, MD  cephALEXin (KEFLEX) 250 MG capsule Take 1 capsule (250 mg total) by mouth 3 (three) times daily. 05/24/20   Irean Hong, MD  docusate sodium (COLACE) 100 MG capsule Take 1 capsule (100 mg total) by mouth 2 (two) times daily as needed for mild constipation or moderate constipation. 04/01/20   Enedina Finner, MD  feeding supplement, ENSURE ENLIVE, (ENSURE ENLIVE) LIQD Take 237 mLs by mouth 2 (two) times daily between meals. 01/24/18   Enid Baas, MD  metoprolol tartrate (LOPRESSOR) 25 MG tablet Take 1 tablet (25 mg total) by mouth 2 (two) times daily. 04/01/20   Enedina Finner, MD  Multiple Vitamins-Minerals (PRESERVISION AREDS 2+MULTI VIT PO) Take 1 tablet by mouth 2 (two) times daily.    [provider]  Naphazoline-Pheniramine (OPCON-A OP) Place 1-2 drops into both eyes 3 (three) times daily as needed (for itchy/irritated eyes.).    [provider]  pantoprazole (PROTONIX) 40 MG tablet TAKE 1 TABLET BY MOUTH EVERY DAY Patient not taking: Reported on 03/26/2020 02/06/18   Carlean Jews, NP  polyethylene glycol (MIRALAX / GLYCOLAX) packet Take 17 g by mouth daily. Hold if >2 bowel movements /day 01/23/18   Enid Baas, MD  sertraline (ZOLOFT) 100 MG tablet Take 1 tablet (100 mg total) by mouth daily. 03/20/18   Carlean Jews, NP  traMADol (ULTRAM) 50 MG tablet Take 1 tablet (50 mg total) by mouth every 8 (eight) hours as needed for moderate pain or severe pain. 04/02/20   Enedina Finner, MD     Allergies Patient has no known allergies.  Family History  Problem Relation Age of Onset  . Breast cancer Mother 100  . AAA (abdominal aortic aneurysm) Father        ruptured  . Coronary artery disease Brother     Social History Social History   Tobacco Use  . Smoking status: Never Smoker  . Smokeless tobacco: Never Used  Vaping Use  . Vaping Use: Never used  Substance Use Topics  . Alcohol use: Never  . Drug use: Never    Review of Systems  Constitutional: No fever/chills Eyes: No visual changes. ENT: No sore throat. Cardiovascular: Denies chest pain. Respiratory: Denies shortness of breath. Gastrointestinal: No abdominal pain.  No nausea, no vomiting.  No diarrhea.  No constipation. Genitourinary: Positive for dysuria. Musculoskeletal: Positive for left lower leg pain and swelling.  Negative for back pain. Skin: Negative for rash. Neurological: Negative for headaches, focal weakness  or numbness.   ____________________________________________   PHYSICAL EXAM:  VITAL SIGNS: ED Triage Vitals  Enc Vitals Group     BP 05/23/20 1818 (!) 155/105     Pulse Rate 05/23/20 1818 (!) 111     Resp 05/23/20 1818 19     Temp 05/23/20 1818 97.8 F (36.6 C)     Temp Source 05/23/20 1818 Oral     SpO2 05/23/20 1818 97 %     Weight 05/23/20 1819 200 lb (90.7 kg)     Height 05/23/20 1819 5\' 6"  (1.676 m)     Head Circumference --      Peak Flow --      Pain Score 05/23/20 1819 0     Pain Loc --      Pain Edu? --      Excl. in GC? --      Constitutional: Alert and oriented.  Elderly appearing and in no acute distress. Eyes: Conjunctivae are normal. PERRL. EOMI. Head: Atraumatic. Nose: Atraumatic. Mouth/Throat: Mucous membranes are moist.  No dental malocclusion.  Neck: No stridor.  No cervical spine tenderness to palpation.  Cardiovascular: Normal rate, regular rhythm. Grossly normal heart sounds.  Good peripheral circulation. Respiratory: Normal respiratory  effort.  No retractions. Lungs CTAB. Gastrointestinal: Soft and nontender to light or deep palpation. No distention. No abdominal bruits. No CVA tenderness. Musculoskeletal:  LLE with moderate swelling and lateral hematoma.  1+ distal pulses bilaterally.  No joint effusions. Neurologic:  Normal speech and language. No gross focal neurologic deficits are appreciated.  Skin:  Skin is warm, dry and intact. No rash noted. Psychiatric: Mood and affect are normal. Speech and behavior are normal.  ____________________________________________   LABS (all labs ordered are listed, but only abnormal results are displayed)  Labs Reviewed  BASIC METABOLIC PANEL - Abnormal; Notable for the following components:      Result Value   Glucose, Bld 125 (*)    Calcium 8.7 (*)    All other components within normal limits  CBC WITH DIFFERENTIAL/PLATELET - Abnormal; Notable for the following components:   RBC 3.56 (*)    Hemoglobin 10.7 (*)    HCT 33.9 (*)    All other components within normal limits  URINALYSIS, COMPLETE (UACMP) WITH MICROSCOPIC - Abnormal; Notable for the following components:   Color, Urine AMBER (*)    APPearance HAZY (*)    Nitrite POSITIVE (*)    Bacteria, UA MANY (*)    All other components within normal limits  LACTIC ACID, PLASMA  TROPONIN I (HIGH SENSITIVITY)   ____________________________________________  EKG  None ____________________________________________  RADIOLOGY  ED MD interpretation: No fracture/dislocation of left tib/fib; enlarging hematoma, no DVT  Official radiology report(s): DG Tibia/Fibula Left  Result Date: 05/23/2020 CLINICAL DATA:  Pain and redness EXAM: LEFT TIBIA AND FIBULA - 2 VIEW COMPARISON:  None. FINDINGS: No fracture or malalignment. Generalized subcutaneous edema. No radiopaque foreign body or soft tissue gas. IMPRESSION: No acute osseous abnormality. Electronically Signed   By: 07/23/2020 M.D.   On: 05/23/2020 21:33   07/23/2020 Venous Img  Lower Unilateral Left (DVT)  Result Date: 05/24/2020 CLINICAL DATA:  Left lower extremity pain EXAM: LEFT LOWER EXTREMITY VENOUS DOPPLER ULTRASOUND TECHNIQUE: Gray-scale sonography with compression, as well as color and duplex ultrasound, were performed to evaluate the deep venous system(s) from the level of the common femoral vein through the popliteal and proximal calf veins. COMPARISON:  Doppler ultrasound 05/16/2020 FINDINGS: VENOUS Normal compressibility of the common  femoral, superficial femoral, and popliteal veins, as well as the visualized calf veins. Visualized portions of profunda femoral vein and great saphenous vein unremarkable. No filling defects to suggest DVT on grayscale or color Doppler imaging. Doppler waveforms show normal direction of venous flow, normal respiratory plasticity and response to augmentation. Limited views of the contralateral common femoral vein are unremarkable. OTHER Collection at the posterior left leg now measures 17.6 x 2.8 x 10.7 cm, previously 7.8 x 2.1 x 7.6 cm. Limitations: none IMPRESSION: 1. No deep venous thrombosis in the left lower extremity. 2. Increased size of the left posterior leg collection, which now measures up to 17.6 cm in length, previously 7.8 cm. This may be an enlarging hematoma. Electronically Signed   By: Deatra Robinson M.D.   On: 05/24/2020 04:25    ____________________________________________   PROCEDURES  Procedure(s) performed (including Critical Care):  .1-3 Lead EKG Interpretation Performed by: Irean Hong, MD Authorized by: Irean Hong, MD     Interpretation: abnormal     ECG rate:  101   ECG rate assessment: tachycardic     Rhythm: atrial fibrillation     Ectopy: none     Conduction: normal   Comments:     Patient placed on cardiac monitor to evaluate for arrhythmias     ____________________________________________   INITIAL IMPRESSION / ASSESSMENT AND PLAN / ED COURSE  As part of my medical decision making, I  reviewed the following data within the electronic MEDICAL RECORD NUMBER Nursing notes reviewed and incorporated, Labs reviewed, Old chart reviewed, Radiograph reviewed and Notes from prior ED visits     Alison Russo was evaluated in Emergency Department on 05/24/2020 for the symptoms described in the history of present illness. She was evaluated in the context of the global COVID-19 pandemic, which necessitated consideration that the patient might be at risk for infection with the SARS-CoV-2 virus that causes COVID-19. Institutional protocols and algorithms that pertain to the evaluation of patients at risk for COVID-19 are in a state of rapid change based on information released by regulatory bodies including the CDC and federal and state organizations. These policies and algorithms were followed during the patient's care in the ED.    86 year old female presenting for evaluation of UTI and left lower leg hematoma.  Differential diagnosis includes but is not limited to DVT, worsening hematoma, UTI, sepsis, etc.  Laboratory results unremarkable.  Have added troponin, lactic acid.  Will obtain repeat DVT ultrasound.  Awaiting urine specimen.   Clinical Course as of May 24 718  Sat May 24, 2020  8338 Updated patient and her daughter on UA and ultrasound results.  Daughter clarified that patient has not actually started using compression stockings as recommended by surgery because the patient's cardiologist recommended against it.  They then asked her PCP who did recommend compression stockings so they were waiting for clarification from her cardiologist prior to starting it.  Additionally, daughter states patient has not restarted Eliquis as recommended by the surgeon.  Heart rate noted to be A. fib and a rate of 120 during this update.  Patient missed her evening dose of metoprolol because she was in the ED.  Will give small dose IV Cardizem for rate control.  They agreed to Ace wrap site of hematoma for  compression.   [JS]  0626 HR 110-114 after IV Cardizem.  Will give patient's oral metoprolol at this time for better rate control.   [JS]  [id number] Care transferred to  Dr. Larinda ButteryJessup at change of shift.  If heart rate is better controlled after oral metoprolol, anticipate patient may be discharged home on Keflex and close follow-up with her PCP.   [JS]    Clinical Course User Index [JS] Irean HongSung, Delina Kruczek J, MD     ____________________________________________   FINAL CLINICAL IMPRESSION(S) / ED DIAGNOSES  Final diagnoses:  Leg hematoma, left, initial encounter  Atrial fibrillation, unspecified type [facility](HCC)  Lower urinary tract infectious disease     ED Discharge Orders         Ordered    cephALEXin (KEFLEX) 250 MG capsule  3 times daily        05/24/20 [id number]           Note:  This document was prepared using Dragon voice recognition software and may include unintentional dictation errors.   Irean HongSung, Wessley Emert J, MD 05/24/20 [phone]

## 2020-05-24 NOTE — ED Provider Notes (Signed)
-----------------------------------------   7:00 AM on 05/24/2020 -----------------------------------------  Blood pressure 105/88, pulse (!) 107, temperature 97.7 F (36.5 C), temperature source Oral, resp. rate (!) 25, height 5\' 6"  (1.676 m), weight 90.7 kg, SpO2 95 %.  Assuming care from Dr. .  In short, Alison Russo is a 86 y.o. female with a chief complaint of Fall and Urinary Tract Infection .  Refer to the original H&P for additional details.  The current plan of care is to recheck HR to ensure atrial fibrillation controlled, patient also to start abx for UTI.  ----------------------------------------- 8:47 AM on 05/24/2020 -----------------------------------------  On reevaluation, patient's heart rate is improving and is now consistently in the high 90s.  Hematoma to left lower extremity is stable and patient is appropriate for discharge home.  She was counseled to further discuss anticoagulation with her PCP and to return to the ED for new worsening symptoms.  Patient and daughter agree with plan.    07/24/2020, MD 05/24/20 [phone]

## 2020-05-27 DIAGNOSIS — G4733 Obstructive sleep apnea (adult) (pediatric): Secondary | ICD-10-CM | POA: Diagnosis not present

## 2020-05-27 DIAGNOSIS — Z6832 Body mass index (BMI) 32.0-32.9, adult: Secondary | ICD-10-CM | POA: Diagnosis not present

## 2020-05-27 DIAGNOSIS — I480 Paroxysmal atrial fibrillation: Secondary | ICD-10-CM | POA: Diagnosis not present

## 2020-05-27 DIAGNOSIS — M6751 Plica syndrome, right knee: Secondary | ICD-10-CM | POA: Diagnosis not present

## 2020-05-27 DIAGNOSIS — K219 Gastro-esophageal reflux disease without esophagitis: Secondary | ICD-10-CM | POA: Diagnosis not present

## 2020-05-27 DIAGNOSIS — R32 Unspecified urinary incontinence: Secondary | ICD-10-CM | POA: Diagnosis not present

## 2020-05-27 DIAGNOSIS — F329 Major depressive disorder, single episode, unspecified: Secondary | ICD-10-CM | POA: Diagnosis not present

## 2020-05-27 DIAGNOSIS — I252 Old myocardial infarction: Secondary | ICD-10-CM | POA: Diagnosis not present

## 2020-05-27 DIAGNOSIS — M80051D Age-related osteoporosis with current pathological fracture, right femur, subsequent encounter for fracture with routine healing: Secondary | ICD-10-CM | POA: Diagnosis not present

## 2020-05-27 DIAGNOSIS — I10 Essential (primary) hypertension: Secondary | ICD-10-CM | POA: Diagnosis not present

## 2020-05-27 DIAGNOSIS — Z7901 Long term (current) use of anticoagulants: Secondary | ICD-10-CM | POA: Diagnosis not present

## 2020-05-27 DIAGNOSIS — E669 Obesity, unspecified: Secondary | ICD-10-CM | POA: Diagnosis not present

## 2020-05-27 DIAGNOSIS — Z86711 Personal history of pulmonary embolism: Secondary | ICD-10-CM | POA: Diagnosis not present

## 2020-05-27 DIAGNOSIS — D509 Iron deficiency anemia, unspecified: Secondary | ICD-10-CM | POA: Diagnosis not present

## 2020-05-27 DIAGNOSIS — Z9181 History of falling: Secondary | ICD-10-CM | POA: Diagnosis not present

## 2020-05-27 DIAGNOSIS — F419 Anxiety disorder, unspecified: Secondary | ICD-10-CM | POA: Diagnosis not present

## 2020-05-27 DIAGNOSIS — I2510 Atherosclerotic heart disease of native coronary artery without angina pectoris: Secondary | ICD-10-CM | POA: Diagnosis not present

## 2020-05-27 DIAGNOSIS — Z85038 Personal history of other malignant neoplasm of large intestine: Secondary | ICD-10-CM | POA: Diagnosis not present

## 2020-05-28 DIAGNOSIS — K219 Gastro-esophageal reflux disease without esophagitis: Secondary | ICD-10-CM | POA: Diagnosis not present

## 2020-05-28 DIAGNOSIS — I480 Paroxysmal atrial fibrillation: Secondary | ICD-10-CM | POA: Diagnosis not present

## 2020-05-28 DIAGNOSIS — Z6832 Body mass index (BMI) 32.0-32.9, adult: Secondary | ICD-10-CM | POA: Diagnosis not present

## 2020-05-28 DIAGNOSIS — G4733 Obstructive sleep apnea (adult) (pediatric): Secondary | ICD-10-CM | POA: Diagnosis not present

## 2020-05-28 DIAGNOSIS — D509 Iron deficiency anemia, unspecified: Secondary | ICD-10-CM | POA: Diagnosis not present

## 2020-05-28 DIAGNOSIS — Z85038 Personal history of other malignant neoplasm of large intestine: Secondary | ICD-10-CM | POA: Diagnosis not present

## 2020-05-28 DIAGNOSIS — Z86711 Personal history of pulmonary embolism: Secondary | ICD-10-CM | POA: Diagnosis not present

## 2020-05-28 DIAGNOSIS — I10 Essential (primary) hypertension: Secondary | ICD-10-CM | POA: Diagnosis not present

## 2020-05-28 DIAGNOSIS — M80051D Age-related osteoporosis with current pathological fracture, right femur, subsequent encounter for fracture with routine healing: Secondary | ICD-10-CM | POA: Diagnosis not present

## 2020-05-28 DIAGNOSIS — M6751 Plica syndrome, right knee: Secondary | ICD-10-CM | POA: Diagnosis not present

## 2020-05-28 DIAGNOSIS — R32 Unspecified urinary incontinence: Secondary | ICD-10-CM | POA: Diagnosis not present

## 2020-05-28 DIAGNOSIS — I2510 Atherosclerotic heart disease of native coronary artery without angina pectoris: Secondary | ICD-10-CM | POA: Diagnosis not present

## 2020-05-28 DIAGNOSIS — I252 Old myocardial infarction: Secondary | ICD-10-CM | POA: Diagnosis not present

## 2020-05-28 DIAGNOSIS — E669 Obesity, unspecified: Secondary | ICD-10-CM | POA: Diagnosis not present

## 2020-05-28 DIAGNOSIS — F329 Major depressive disorder, single episode, unspecified: Secondary | ICD-10-CM | POA: Diagnosis not present

## 2020-05-28 DIAGNOSIS — Z9181 History of falling: Secondary | ICD-10-CM | POA: Diagnosis not present

## 2020-05-28 DIAGNOSIS — Z7901 Long term (current) use of anticoagulants: Secondary | ICD-10-CM | POA: Diagnosis not present

## 2020-05-28 DIAGNOSIS — F419 Anxiety disorder, unspecified: Secondary | ICD-10-CM | POA: Diagnosis not present

## 2020-05-29 DIAGNOSIS — S72131D Displaced apophyseal fracture of right femur, subsequent encounter for closed fracture with routine healing: Secondary | ICD-10-CM | POA: Diagnosis not present

## 2020-05-29 DIAGNOSIS — M7989 Other specified soft tissue disorders: Secondary | ICD-10-CM | POA: Diagnosis not present

## 2020-05-29 DIAGNOSIS — Z23 Encounter for immunization: Secondary | ICD-10-CM | POA: Diagnosis not present

## 2020-05-30 DIAGNOSIS — I10 Essential (primary) hypertension: Secondary | ICD-10-CM | POA: Diagnosis not present

## 2020-05-30 DIAGNOSIS — Z111 Encounter for screening for respiratory tuberculosis: Secondary | ICD-10-CM | POA: Diagnosis not present

## 2020-06-02 DIAGNOSIS — I480 Paroxysmal atrial fibrillation: Secondary | ICD-10-CM | POA: Diagnosis not present

## 2020-06-02 DIAGNOSIS — S8012XA Contusion of left lower leg, initial encounter: Secondary | ICD-10-CM | POA: Diagnosis not present

## 2020-06-03 DIAGNOSIS — R32 Unspecified urinary incontinence: Secondary | ICD-10-CM | POA: Diagnosis not present

## 2020-06-03 DIAGNOSIS — K219 Gastro-esophageal reflux disease without esophagitis: Secondary | ICD-10-CM | POA: Diagnosis not present

## 2020-06-03 DIAGNOSIS — M80051D Age-related osteoporosis with current pathological fracture, right femur, subsequent encounter for fracture with routine healing: Secondary | ICD-10-CM | POA: Diagnosis not present

## 2020-06-03 DIAGNOSIS — I480 Paroxysmal atrial fibrillation: Secondary | ICD-10-CM | POA: Diagnosis not present

## 2020-06-03 DIAGNOSIS — I2510 Atherosclerotic heart disease of native coronary artery without angina pectoris: Secondary | ICD-10-CM | POA: Diagnosis not present

## 2020-06-03 DIAGNOSIS — Z6832 Body mass index (BMI) 32.0-32.9, adult: Secondary | ICD-10-CM | POA: Diagnosis not present

## 2020-06-03 DIAGNOSIS — F419 Anxiety disorder, unspecified: Secondary | ICD-10-CM | POA: Diagnosis not present

## 2020-06-03 DIAGNOSIS — I10 Essential (primary) hypertension: Secondary | ICD-10-CM | POA: Diagnosis not present

## 2020-06-03 DIAGNOSIS — E669 Obesity, unspecified: Secondary | ICD-10-CM | POA: Diagnosis not present

## 2020-06-03 DIAGNOSIS — I252 Old myocardial infarction: Secondary | ICD-10-CM | POA: Diagnosis not present

## 2020-06-03 DIAGNOSIS — F329 Major depressive disorder, single episode, unspecified: Secondary | ICD-10-CM | POA: Diagnosis not present

## 2020-06-03 DIAGNOSIS — D509 Iron deficiency anemia, unspecified: Secondary | ICD-10-CM | POA: Diagnosis not present

## 2020-06-03 DIAGNOSIS — Z85038 Personal history of other malignant neoplasm of large intestine: Secondary | ICD-10-CM | POA: Diagnosis not present

## 2020-06-03 DIAGNOSIS — Z7901 Long term (current) use of anticoagulants: Secondary | ICD-10-CM | POA: Diagnosis not present

## 2020-06-03 DIAGNOSIS — Z86711 Personal history of pulmonary embolism: Secondary | ICD-10-CM | POA: Diagnosis not present

## 2020-06-03 DIAGNOSIS — M6751 Plica syndrome, right knee: Secondary | ICD-10-CM | POA: Diagnosis not present

## 2020-06-03 DIAGNOSIS — G4733 Obstructive sleep apnea (adult) (pediatric): Secondary | ICD-10-CM | POA: Diagnosis not present

## 2020-06-03 DIAGNOSIS — Z9181 History of falling: Secondary | ICD-10-CM | POA: Diagnosis not present

## 2020-06-11 DIAGNOSIS — S72331A Displaced oblique fracture of shaft of right femur, initial encounter for closed fracture: Secondary | ICD-10-CM | POA: Diagnosis not present

## 2020-06-12 DIAGNOSIS — I214 Non-ST elevation (NSTEMI) myocardial infarction: Secondary | ICD-10-CM | POA: Diagnosis not present

## 2020-06-12 DIAGNOSIS — R000 Tachycardia, unspecified: Secondary | ICD-10-CM | POA: Diagnosis not present

## 2020-06-12 DIAGNOSIS — T81718A Complication of other artery following a procedure, not elsewhere classified, initial encounter: Secondary | ICD-10-CM | POA: Diagnosis not present

## 2020-06-12 DIAGNOSIS — M7989 Other specified soft tissue disorders: Secondary | ICD-10-CM | POA: Diagnosis not present

## 2020-06-12 DIAGNOSIS — I209 Angina pectoris, unspecified: Secondary | ICD-10-CM | POA: Diagnosis not present

## 2020-06-12 DIAGNOSIS — Z955 Presence of coronary angioplasty implant and graft: Secondary | ICD-10-CM | POA: Diagnosis not present

## 2020-06-12 DIAGNOSIS — R011 Cardiac murmur, unspecified: Secondary | ICD-10-CM | POA: Diagnosis not present

## 2020-06-12 DIAGNOSIS — I729 Aneurysm of unspecified site: Secondary | ICD-10-CM | POA: Diagnosis not present

## 2020-06-12 DIAGNOSIS — R0602 Shortness of breath: Secondary | ICD-10-CM | POA: Diagnosis not present

## 2020-06-12 DIAGNOSIS — I10 Essential (primary) hypertension: Secondary | ICD-10-CM | POA: Diagnosis not present

## 2020-06-12 DIAGNOSIS — I480 Paroxysmal atrial fibrillation: Secondary | ICD-10-CM | POA: Diagnosis not present

## 2020-06-12 DIAGNOSIS — I2510 Atherosclerotic heart disease of native coronary artery without angina pectoris: Secondary | ICD-10-CM | POA: Diagnosis not present

## 2020-06-24 DIAGNOSIS — I480 Paroxysmal atrial fibrillation: Secondary | ICD-10-CM | POA: Diagnosis not present

## 2020-06-24 DIAGNOSIS — F32A Depression, unspecified: Secondary | ICD-10-CM | POA: Diagnosis not present

## 2020-06-24 DIAGNOSIS — I2510 Atherosclerotic heart disease of native coronary artery without angina pectoris: Secondary | ICD-10-CM | POA: Diagnosis not present

## 2020-06-24 DIAGNOSIS — I10 Essential (primary) hypertension: Secondary | ICD-10-CM | POA: Diagnosis not present

## 2020-06-24 DIAGNOSIS — S8012XD Contusion of left lower leg, subsequent encounter: Secondary | ICD-10-CM | POA: Diagnosis not present

## 2020-07-08 DIAGNOSIS — Z955 Presence of coronary angioplasty implant and graft: Secondary | ICD-10-CM | POA: Diagnosis not present

## 2020-07-08 DIAGNOSIS — R0602 Shortness of breath: Secondary | ICD-10-CM | POA: Diagnosis not present

## 2020-07-08 DIAGNOSIS — I2510 Atherosclerotic heart disease of native coronary artery without angina pectoris: Secondary | ICD-10-CM | POA: Diagnosis not present

## 2020-07-08 DIAGNOSIS — I214 Non-ST elevation (NSTEMI) myocardial infarction: Secondary | ICD-10-CM | POA: Diagnosis not present

## 2020-07-08 DIAGNOSIS — I10 Essential (primary) hypertension: Secondary | ICD-10-CM | POA: Diagnosis not present

## 2020-07-08 DIAGNOSIS — R000 Tachycardia, unspecified: Secondary | ICD-10-CM | POA: Diagnosis not present

## 2020-07-08 DIAGNOSIS — I209 Angina pectoris, unspecified: Secondary | ICD-10-CM | POA: Diagnosis not present

## 2020-07-08 DIAGNOSIS — S8012XD Contusion of left lower leg, subsequent encounter: Secondary | ICD-10-CM | POA: Diagnosis not present

## 2020-07-08 DIAGNOSIS — I480 Paroxysmal atrial fibrillation: Secondary | ICD-10-CM | POA: Diagnosis not present

## 2020-10-08 DIAGNOSIS — I480 Paroxysmal atrial fibrillation: Secondary | ICD-10-CM | POA: Diagnosis not present

## 2020-10-08 DIAGNOSIS — Z0000 Encounter for general adult medical examination without abnormal findings: Secondary | ICD-10-CM | POA: Diagnosis not present

## 2020-10-08 DIAGNOSIS — I10 Essential (primary) hypertension: Secondary | ICD-10-CM | POA: Diagnosis not present

## 2020-10-08 DIAGNOSIS — I2510 Atherosclerotic heart disease of native coronary artery without angina pectoris: Secondary | ICD-10-CM | POA: Diagnosis not present

## 2020-10-08 DIAGNOSIS — Z0001 Encounter for general adult medical examination with abnormal findings: Secondary | ICD-10-CM | POA: Diagnosis not present

## 2020-10-08 DIAGNOSIS — F32A Depression, unspecified: Secondary | ICD-10-CM | POA: Diagnosis not present

## 2020-10-13 DIAGNOSIS — T81718A Complication of other artery following a procedure, not elsewhere classified, initial encounter: Secondary | ICD-10-CM | POA: Diagnosis not present

## 2020-10-13 DIAGNOSIS — Z955 Presence of coronary angioplasty implant and graft: Secondary | ICD-10-CM | POA: Diagnosis not present

## 2020-10-13 DIAGNOSIS — I480 Paroxysmal atrial fibrillation: Secondary | ICD-10-CM | POA: Diagnosis not present

## 2020-10-13 DIAGNOSIS — I209 Angina pectoris, unspecified: Secondary | ICD-10-CM | POA: Diagnosis not present

## 2020-10-13 DIAGNOSIS — I729 Aneurysm of unspecified site: Secondary | ICD-10-CM | POA: Diagnosis not present

## 2020-10-13 DIAGNOSIS — R000 Tachycardia, unspecified: Secondary | ICD-10-CM | POA: Diagnosis not present

## 2020-10-13 DIAGNOSIS — I10 Essential (primary) hypertension: Secondary | ICD-10-CM | POA: Diagnosis not present

## 2020-10-13 DIAGNOSIS — S8012XS Contusion of left lower leg, sequela: Secondary | ICD-10-CM | POA: Diagnosis not present

## 2020-10-13 DIAGNOSIS — S301XXA Contusion of abdominal wall, initial encounter: Secondary | ICD-10-CM | POA: Diagnosis not present

## 2020-10-13 DIAGNOSIS — R0602 Shortness of breath: Secondary | ICD-10-CM | POA: Diagnosis not present

## 2020-10-13 DIAGNOSIS — I2510 Atherosclerotic heart disease of native coronary artery without angina pectoris: Secondary | ICD-10-CM | POA: Diagnosis not present

## 2020-10-13 DIAGNOSIS — I214 Non-ST elevation (NSTEMI) myocardial infarction: Secondary | ICD-10-CM | POA: Diagnosis not present

## 2020-12-22 DIAGNOSIS — H353132 Nonexudative age-related macular degeneration, bilateral, intermediate dry stage: Secondary | ICD-10-CM | POA: Diagnosis not present

## 2021-02-05 DIAGNOSIS — I2510 Atherosclerotic heart disease of native coronary artery without angina pectoris: Secondary | ICD-10-CM | POA: Diagnosis not present

## 2021-02-05 DIAGNOSIS — I480 Paroxysmal atrial fibrillation: Secondary | ICD-10-CM | POA: Diagnosis not present

## 2021-02-05 DIAGNOSIS — K219 Gastro-esophageal reflux disease without esophagitis: Secondary | ICD-10-CM | POA: Diagnosis not present

## 2021-02-05 DIAGNOSIS — G4730 Sleep apnea, unspecified: Secondary | ICD-10-CM | POA: Diagnosis not present

## 2021-02-05 DIAGNOSIS — I10 Essential (primary) hypertension: Secondary | ICD-10-CM | POA: Diagnosis not present

## 2021-02-05 DIAGNOSIS — F32A Depression, unspecified: Secondary | ICD-10-CM | POA: Diagnosis not present

## 2021-02-09 DIAGNOSIS — R0602 Shortness of breath: Secondary | ICD-10-CM | POA: Diagnosis not present

## 2021-02-09 DIAGNOSIS — I209 Angina pectoris, unspecified: Secondary | ICD-10-CM | POA: Diagnosis not present

## 2021-02-09 DIAGNOSIS — R011 Cardiac murmur, unspecified: Secondary | ICD-10-CM | POA: Diagnosis not present

## 2021-02-09 DIAGNOSIS — I480 Paroxysmal atrial fibrillation: Secondary | ICD-10-CM | POA: Diagnosis not present

## 2021-02-09 DIAGNOSIS — I214 Non-ST elevation (NSTEMI) myocardial infarction: Secondary | ICD-10-CM | POA: Diagnosis not present

## 2021-02-09 DIAGNOSIS — Z955 Presence of coronary angioplasty implant and graft: Secondary | ICD-10-CM | POA: Diagnosis not present

## 2021-02-09 DIAGNOSIS — I2510 Atherosclerotic heart disease of native coronary artery without angina pectoris: Secondary | ICD-10-CM | POA: Diagnosis not present

## 2021-02-09 DIAGNOSIS — R000 Tachycardia, unspecified: Secondary | ICD-10-CM | POA: Diagnosis not present

## 2021-02-09 DIAGNOSIS — I10 Essential (primary) hypertension: Secondary | ICD-10-CM | POA: Diagnosis not present

## 2021-02-12 ENCOUNTER — Emergency Department: Payer: PPO

## 2021-02-12 ENCOUNTER — Inpatient Hospital Stay
Admission: EM | Admit: 2021-02-12 | Discharge: 2021-02-16 | DRG: 271 | Disposition: A | Discharge status: Home / Self Care | Payer: PPO | Attending: Internal Medicine | Admitting: Internal Medicine

## 2021-02-12 ENCOUNTER — Other Ambulatory Visit: Payer: Self-pay

## 2021-02-12 ENCOUNTER — Encounter
Admission: EM | Disposition: A | Discharge status: Home / Self Care | Payer: Self-pay | Source: Home / Self Care | Attending: Internal Medicine

## 2021-02-12 DIAGNOSIS — I4891 Unspecified atrial fibrillation: Secondary | ICD-10-CM | POA: Diagnosis present

## 2021-02-12 DIAGNOSIS — I2510 Atherosclerotic heart disease of native coronary artery without angina pectoris: Secondary | ICD-10-CM | POA: Diagnosis present

## 2021-02-12 DIAGNOSIS — R011 Cardiac murmur, unspecified: Secondary | ICD-10-CM | POA: Diagnosis present

## 2021-02-12 DIAGNOSIS — I10 Essential (primary) hypertension: Secondary | ICD-10-CM | POA: Diagnosis present

## 2021-02-12 DIAGNOSIS — I774 Celiac artery compression syndrome: Secondary | ICD-10-CM | POA: Diagnosis not present

## 2021-02-12 DIAGNOSIS — I70201 Unspecified atherosclerosis of native arteries of extremities, right leg: Secondary | ICD-10-CM | POA: Diagnosis not present

## 2021-02-12 DIAGNOSIS — F419 Anxiety disorder, unspecified: Secondary | ICD-10-CM | POA: Diagnosis present

## 2021-02-12 DIAGNOSIS — Z20822 Contact with and (suspected) exposure to covid-19: Secondary | ICD-10-CM | POA: Diagnosis present

## 2021-02-12 DIAGNOSIS — I708 Atherosclerosis of other arteries: Secondary | ICD-10-CM | POA: Diagnosis not present

## 2021-02-12 DIAGNOSIS — M79606 Pain in leg, unspecified: Secondary | ICD-10-CM

## 2021-02-12 DIAGNOSIS — I82431 Acute embolism and thrombosis of right popliteal vein: Principal | ICD-10-CM | POA: Diagnosis present

## 2021-02-12 DIAGNOSIS — I252 Old myocardial infarction: Secondary | ICD-10-CM | POA: Diagnosis not present

## 2021-02-12 DIAGNOSIS — I480 Paroxysmal atrial fibrillation: Secondary | ICD-10-CM | POA: Diagnosis present

## 2021-02-12 DIAGNOSIS — M79604 Pain in right leg: Secondary | ICD-10-CM | POA: Diagnosis not present

## 2021-02-12 DIAGNOSIS — Z86711 Personal history of pulmonary embolism: Secondary | ICD-10-CM | POA: Diagnosis not present

## 2021-02-12 DIAGNOSIS — Z8249 Family history of ischemic heart disease and other diseases of the circulatory system: Secondary | ICD-10-CM | POA: Diagnosis not present

## 2021-02-12 DIAGNOSIS — K449 Diaphragmatic hernia without obstruction or gangrene: Secondary | ICD-10-CM | POA: Diagnosis not present

## 2021-02-12 DIAGNOSIS — Z86718 Personal history of other venous thrombosis and embolism: Secondary | ICD-10-CM | POA: Diagnosis not present

## 2021-02-12 DIAGNOSIS — Z803 Family history of malignant neoplasm of breast: Secondary | ICD-10-CM

## 2021-02-12 DIAGNOSIS — I70209 Unspecified atherosclerosis of native arteries of extremities, unspecified extremity: Secondary | ICD-10-CM | POA: Diagnosis present

## 2021-02-12 DIAGNOSIS — Z7901 Long term (current) use of anticoagulants: Secondary | ICD-10-CM

## 2021-02-12 DIAGNOSIS — Z825 Family history of asthma and other chronic lower respiratory diseases: Secondary | ICD-10-CM

## 2021-02-12 DIAGNOSIS — G4730 Sleep apnea, unspecified: Secondary | ICD-10-CM | POA: Diagnosis present

## 2021-02-12 DIAGNOSIS — M7989 Other specified soft tissue disorders: Secondary | ICD-10-CM | POA: Diagnosis not present

## 2021-02-12 DIAGNOSIS — Z79899 Other long term (current) drug therapy: Secondary | ICD-10-CM | POA: Diagnosis not present

## 2021-02-12 DIAGNOSIS — I998 Other disorder of circulatory system: Secondary | ICD-10-CM | POA: Diagnosis not present

## 2021-02-12 DIAGNOSIS — E785 Hyperlipidemia, unspecified: Secondary | ICD-10-CM | POA: Diagnosis present

## 2021-02-12 DIAGNOSIS — J9601 Acute respiratory failure with hypoxia: Secondary | ICD-10-CM | POA: Diagnosis not present

## 2021-02-12 DIAGNOSIS — L7622 Postprocedural hemorrhage and hematoma of skin and subcutaneous tissue following other procedure: Secondary | ICD-10-CM | POA: Diagnosis not present

## 2021-02-12 DIAGNOSIS — I482 Chronic atrial fibrillation, unspecified: Secondary | ICD-10-CM | POA: Diagnosis not present

## 2021-02-12 DIAGNOSIS — G4733 Obstructive sleep apnea (adult) (pediatric): Secondary | ICD-10-CM | POA: Diagnosis present

## 2021-02-12 DIAGNOSIS — Z955 Presence of coronary angioplasty implant and graft: Secondary | ICD-10-CM

## 2021-02-12 DIAGNOSIS — I743 Embolism and thrombosis of arteries of the lower extremities: Secondary | ICD-10-CM | POA: Diagnosis present

## 2021-02-12 DIAGNOSIS — S8012XA Contusion of left lower leg, initial encounter: Secondary | ICD-10-CM | POA: Diagnosis present

## 2021-02-12 DIAGNOSIS — I25110 Atherosclerotic heart disease of native coronary artery with unstable angina pectoris: Secondary | ICD-10-CM | POA: Diagnosis not present

## 2021-02-12 HISTORY — PX: LOWER EXTREMITY ANGIOGRAPHY: CATH118251

## 2021-02-12 LAB — COMPREHENSIVE METABOLIC PANEL
ALT: 23 U/L (ref 0–44)
AST: 36 U/L (ref 15–41)
Albumin: 3.6 g/dL (ref 3.5–5.0)
Alkaline Phosphatase: 75 U/L (ref 38–126)
Anion gap: 7 (ref 5–15)
BUN: 15 mg/dL (ref 8–23)
CO2: 25 mmol/L (ref 22–32)
Calcium: 9.2 mg/dL (ref 8.9–10.3)
Chloride: 106 mmol/L (ref 98–111)
Creatinine, Ser: 0.6 mg/dL (ref 0.44–1.00)
GFR, Estimated: >60 mL/min (ref >60)
Glucose, Bld: 87 mg/dL (ref 70–99)
Potassium: 4.3 mmol/L (ref 3.5–5.1)
Sodium: 138 mmol/L (ref 135–145)
Total Bilirubin: 1.2 mg/dL (ref 0.3–1.2)
Total Protein: 7.2 g/dL (ref 6.5–8.1)

## 2021-02-12 LAB — CBC WITH DIFFERENTIAL/PLATELET
Abs Immature Granulocytes: 0.02 10*3/uL (ref 0.00–0.07)
Basophils Absolute: 0 10*3/uL (ref 0.0–0.1)
Basophils Relative: 1 %
Eosinophils Absolute: 0.2 10*3/uL (ref 0.0–0.5)
Eosinophils Relative: 2 %
HCT: 40.9 % (ref 36.0–46.0)
Hemoglobin: 13.6 g/dL (ref 12.0–15.0)
Immature Granulocytes: 0 %
Lymphocytes Relative: 26 %
Lymphs Abs: 1.9 10*3/uL (ref 0.7–4.0)
MCH: 30.6 pg (ref 26.0–34.0)
MCHC: 33.3 g/dL (ref 30.0–36.0)
MCV: 92.1 fL (ref 80.0–100.0)
Monocytes Absolute: 0.5 10*3/uL (ref 0.1–1.0)
Monocytes Relative: 6 %
Neutro Abs: 4.7 10*3/uL (ref 1.7–7.7)
Neutrophils Relative %: 65 %
Platelets: 126 10*3/uL — ABNORMAL LOW (ref 150–400)
RBC: 4.44 MIL/uL (ref 3.87–5.11)
RDW: 13.2 % (ref 11.5–15.5)
WBC: 7.2 10*3/uL (ref 4.0–10.5)
nRBC: 0 % (ref 0.0–0.2)

## 2021-02-12 LAB — TYPE AND SCREEN
ABO/RH(D): A POS
Antibody Screen: NEGATIVE

## 2021-02-12 LAB — RESP PANEL BY RT-PCR (FLU A&B, COVID) ARPGX2
Influenza A by PCR: NEGATIVE
Influenza B by PCR: NEGATIVE
SARS Coronavirus 2 by RT PCR: NEGATIVE

## 2021-02-12 LAB — APTT: aPTT: 28 seconds (ref 24–36)

## 2021-02-12 LAB — HEPARIN LEVEL (UNFRACTIONATED): Heparin Unfractionated: <0.1 IU/mL — ABNORMAL LOW (ref 0.30–0.70)

## 2021-02-12 SURGERY — LOWER EXTREMITY ANGIOGRAPHY
Anesthesia: Moderate Sedation | Laterality: Right

## 2021-02-12 MED ORDER — CEFAZOLIN SODIUM-DEXTROSE 2-4 GM/100ML-% IV SOLN
2.0000 g | INTRAVENOUS | Status: DC
  Filled 2021-02-12 (×2): qty 100

## 2021-02-12 MED ORDER — ALTEPLASE 2 MG IJ SOLR
INTRAMUSCULAR | Status: DC | PRN
  Administered 2021-02-12 (×2): 5 mg

## 2021-02-12 MED ORDER — FENTANYL CITRATE (PF) 100 MCG/2ML IJ SOLN
INTRAMUSCULAR | Status: DC | PRN
  Administered 2021-02-12: 25 ug via INTRAVENOUS

## 2021-02-12 MED ORDER — MIDAZOLAM HCL 2 MG/2ML IJ SOLN
INTRAMUSCULAR | Status: AC
  Filled 2021-02-12: qty 2

## 2021-02-12 MED ORDER — FENTANYL CITRATE (PF) 100 MCG/2ML IJ SOLN
INTRAMUSCULAR | Status: AC
  Filled 2021-02-12: qty 2

## 2021-02-12 MED ORDER — HEPARIN BOLUS VIA INFUSION
4000.0000 [IU] | Freq: Once | INTRAVENOUS | Status: AC
  Administered 2021-02-12: 4000 [IU] via INTRAVENOUS
  Filled 2021-02-12: qty 4000

## 2021-02-12 MED ORDER — HEPARIN (PORCINE) 25000 UT/250ML-% IV SOLN
1250.0000 [IU]/h | INTRAVENOUS | Status: DC
  Administered 2021-02-12: 1250 [IU]/h via INTRAVENOUS
  Filled 2021-02-12: qty 250

## 2021-02-12 MED ORDER — MIDAZOLAM HCL 2 MG/2ML IJ SOLN
INTRAMUSCULAR | Status: DC | PRN
  Administered 2021-02-12 (×3): 0.5 mg via INTRAVENOUS

## 2021-02-12 MED ORDER — SODIUM CHLORIDE 0.9 % IV SOLN: INTRAVENOUS | Status: DC

## 2021-02-12 MED ORDER — IOHEXOL 350 MG/ML SOLN
100.0000 mL | Freq: Once | INTRAVENOUS | Status: AC | PRN
  Administered 2021-02-12: 100 mL via INTRAVENOUS

## 2021-02-12 MED ORDER — ONDANSETRON HCL 4 MG/2ML IJ SOLN
4.0000 mg | Freq: Once | INTRAMUSCULAR | Status: AC
  Administered 2021-02-12: 4 mg via INTRAVENOUS

## 2021-02-12 SURGICAL SUPPLY — 27 items
BALLN LUTONIX 018 6X60X130 (BALLOONS) ×2
BALLN ULTRVRSE 3X40X150 (BALLOONS) ×1
BALLN ULTRVRSE 3X40X150 OTW (BALLOONS) ×1
BALLN ULTRVRSE 6X100X130C (BALLOONS) ×2
BALLOON LUTONIX 018 6X60X130 (BALLOONS) IMPLANT
BALLOON ULTRVRSE 3X40X150 OTW (BALLOONS) IMPLANT
BALLOON ULTRVRSE 6X100X130C (BALLOONS) IMPLANT
CANISTER PENUMBRA ENGINE (MISCELLANEOUS) ×1 IMPLANT
CANNULA 5F STIFF (CANNULA) ×1 IMPLANT
CATH ANGIO 5F PIGTAIL 65CM (CATHETERS) ×1 IMPLANT
CATH INDIGO SEP 7 (CATHETERS) ×1 IMPLANT
CATH LIGHTNING 7 XTORQ 130 (CATHETERS) ×1 IMPLANT
CATH VERT 5FR 125CM (CATHETERS) ×1 IMPLANT
COVER PROBE U/S 5X48 (MISCELLANEOUS) ×1 IMPLANT
DEVICE SAFEGUARD 24CM (GAUZE/BANDAGES/DRESSINGS) ×1 IMPLANT
DEVICE STARCLOSE SE CLOSURE (Vascular Products) ×1 IMPLANT
GLIDEWIRE ADV .035X260CM (WIRE) ×1 IMPLANT
KIT ENCORE 26 ADVANTAGE (KITS) ×1 IMPLANT
PACK ANGIOGRAPHY (CUSTOM PROCEDURE TRAY) ×2 IMPLANT
SHEATH BRITE TIP 5FRX11 (SHEATH) ×1 IMPLANT
SHEATH BRITE TIP 7FRX11 (SHEATH) ×1 IMPLANT
SHEATH PINNACLE ST 7F 65CM (SHEATH) ×1 IMPLANT
STENT VIABAHN 6X50X120 (Permanent Stent) ×1 IMPLANT
STENT VIABAHN 7X100X120 (Permanent Stent) ×1 IMPLANT
TUBING CONTRAST HIGH PRESS 48 (TUBING) ×2 IMPLANT
WIRE G V18X300CM (WIRE) ×1 IMPLANT
WIRE GUIDERIGHT .035X150 (WIRE) ×1 IMPLANT

## 2021-02-12 NOTE — ED Provider Notes (Signed)
[facility] Emergency Department Provider Note   ____________________________________________   Event Date/Time   First MD Initiated Contact with Patient 02/12/21 1803     (approximate)  I have reviewed the triage vital signs and the nursing notes.   HISTORY  Chief Complaint Leg Pain    HPI Alison Russo is a 87 y.o. female with the below stated past medical history who presents for right lower extremity pain and edema that has been worsening over the last week.  Patient states that this pain is worsened when walking and partially relieves at rest.  Patient does state history of right hip/femur surgery in 2021.  Patient describes aching pain that radiates up her right leg and is 4/10 in severity.  Patient currently denies any vision changes, tinnitus, difficulty speaking, facial droop, sore throat, chest pain, shortness of breath, abdominal pain, nausea/vomiting/diarrhea, dysuria, or weakness/numbness/paresthesias in any extremity         Past Medical History:  Diagnosis Date   Anginal pain (HCC)    CAD (coronary artery disease)    Heart murmur    Hypertension    Mood disorder (HCC)    mostly anxiety   NSTEMI (non-ST elevated myocardial infarction) (HCC) 12/22/2017   OSA on CPAP    PE (pulmonary embolism)    recurrent    Patient Active Problem List   Diagnosis Date Noted   Ischemic leg pain 02/13/2021   Age-related osteoporosis with current pathological fracture of right femur with routine healing 04/03/2020   Closed fracture of right femur (HCC)    Acute respiratory failure with hypoxia (HCC)    Acute blood loss anemia    Closed displaced fracture of proximal epiphysis of right femur (HCC) 03/26/2020   Sleep apnea 11/29/2018   Gastroesophageal reflux disease without esophagitis 02/20/2018   Mood disorder (HCC)    Hematoma 02/01/2018   Lymphedema 02/01/2018   Leg pain 01/09/2018   Atrial fibrillation (HCC) 12/28/2017   Heart murmur  12/28/2017   Tachycardia 12/28/2017   CAD (coronary artery disease) 12/25/2017   Essential hypertension 12/25/2017   Hyperlipidemia 12/25/2017   History of pulmonary embolism 12/25/2017   Anemia 12/25/2017   Chronic anticoagulation 12/22/2017   NSTEMI (non-ST elevated myocardial infarction) (HCC) 12/21/2017   Contusion of right knee 02/27/2016   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    Past Surgical History:  Procedure Laterality Date   BREAST CYST ASPIRATION Right    neg   CARDIAC CATHETERIZATION     CATARACT EXTRACTION, BILATERAL     COLONOSCOPY     CORONARY STENT INTERVENTION N/A 12/23/2017   Procedure: CORONARY STENT INTERVENTION;  Surgeon: Swaziland, Peter M, MD;  Location: [facility];  Service: Cardiovascular;  Laterality: N/A;   EYE SURGERY Left    "blood vessel busted behind my eye"   EYE SURGERY Right    ?vitrectomy   HYSTEROSCOPY WITH D & C N/A 10/25/2016   Procedure: DILATATION AND CURETTAGE /HYSTEROSCOPY, POLYPECTOMY;  Surgeon: Christeen Douglas, MD;  Location: [facility] OR[facility];  Service: Gynecology;  Laterality: N/A;   INGUINAL HERNIA REPAIR Right 1950s   INTRAMEDULLARY (IM) NAIL INTERTROCHANTERIC Right 03/27/2020   Procedure: INTRAMEDULLARY (IM) NAIL INTERTROCHANTRIC;  Surgeon: Juanell Fairly, MD;  Location: [facility] OR[facility];  Service: Orthopedics;  Laterality: Right;   LEFT HEART CATH AND CORONARY ANGIOGRAPHY N/A 12/21/2017   Procedure: LEFT HEART CATH AND CORONARY ANGIOGRAPHY possible PCI and stent;  Surgeon: Alwyn Pea, MD;  Location: [facility];  Service: Cardiovascular;  Laterality: N/A;   LOWER EXTREMITY ANGIOGRAPHY Right 01/11/2018   Procedure: LOWER EXTREMITY ANGIOGRAPHY;  Surgeon: Annice Needy, MD;  Location: [facility];  Service: Cardiovascular;  Laterality: Right;   LOWER EXTREMITY ANGIOGRAPHY Right 02/12/2021   Procedure: Lower Extremity Angiography;  Surgeon: Renford Dills, MD;  Location: [facility];  Service: Cardiovascular;   Laterality: Right;   PSEUDOANERYSM COMPRESSION Left 01/20/2018   Procedure: PSEUDOANERYSM COMPRESSION;  Surgeon: Renford Dills, MD;  Location: [facility];  Service: Cardiovascular;  Laterality: Left;   TONSILLECTOMY      Prior to Admission medications   Medication Sig Start Date End Date Taking? Authorizing Provider  metoprolol tartrate (LOPRESSOR) 25 MG tablet Take 1 tablet (25 mg total) by mouth 2 (two) times daily. 04/01/20  Yes Enedina Finner, MD  Multiple Vitamins-Minerals (PRESERVISION AREDS 2+MULTI VIT PO) Take 1 tablet by mouth 2 (two) times daily.   Yes [provider]  sertraline (ZOLOFT) 100 MG tablet Take 1 tablet (100 mg total) by mouth daily. 03/20/18  Yes Boscia, Kathlynn Grate, NP  acetaminophen (TYLENOL) 325 MG tablet Take 2 tablets (650 mg total) by mouth every 6 (six) hours as needed for mild pain (or Fever >/= 101). 01/23/18   Enid Baas, MD  apixaban (ELIQUIS) 5 MG TABS tablet Take 1 tablet (5 mg total) by mouth 2 (two) times daily. 02/16/21 05/17/21  Osvaldo Shipper, MD  aspirin EC 81 MG EC tablet Take 1 tablet (81 mg total) by mouth daily. Swallow whole. 02/16/21   Osvaldo Shipper, MD  atorvastatin (LIPITOR) 20 MG tablet Take 4 tablets (80 mg total) by mouth daily. 02/17/21 05/18/21  Osvaldo Shipper, MD  Elastic Bandages & Supports (JOBST FOR MEN KNEE HIGH/LG) MISC Use compression stockings every day 05/20/20   [provider]  feeding supplement, ENSURE ENLIVE, (ENSURE ENLIVE) LIQD Take 237 mLs by mouth 2 (two) times daily between meals. 01/24/18   Enid Baas, MD  Naphazoline-Pheniramine (OPCON-A OP) Place 1-2 drops into both eyes 3 (three) times daily as needed (for itchy/irritated eyes.).    [provider]  polyethylene glycol (MIRALAX / GLYCOLAX) 17 g packet Take 17 g by mouth daily. 02/17/21   Osvaldo Shipper, MD  traMADol (ULTRAM) 50 MG tablet Take 1 tablet (50 mg total) by mouth every 8 (eight) hours as needed for severe pain.  02/16/21   Osvaldo Shipper, MD    Allergies Patient has no known allergies.  Family History  Problem Relation Age of Onset   Breast cancer Mother 54   AAA (abdominal aortic aneurysm) Father        ruptured   Coronary artery disease Brother     Social History Social History   Tobacco Use   Smoking status: Never   Smokeless tobacco: Never  Vaping Use   Vaping Use: Never used  Substance Use Topics   Alcohol use: Never   Drug use: Never    Review of Systems Constitutional: No fever/chills Eyes: No visual changes. ENT: No sore throat. Cardiovascular: Denies chest pain. Respiratory: Denies shortness of breath. Gastrointestinal: No abdominal pain.  No nausea, no vomiting.  No diarrhea. Genitourinary: Negative for dysuria. Musculoskeletal: Endorses right lower extremity aching pain Skin: Negative for rash. Neurological: Negative for headaches, weakness/numbness/paresthesias in any extremity Psychiatric: Negative for suicidal ideation/homicidal ideation   ____________________________________________   PHYSICAL EXAM:  VITAL SIGNS: ED Triage Vitals  Enc Vitals Group     BP 02/12/21 1613 (!) 157/101  Pulse Rate 02/12/21 1613 80     Resp 02/12/21 1613 20     Temp 02/12/21 1613 97.8 F (36.6 C)     Temp Source 02/12/21 1613 Oral     SpO2 02/12/21 1613 97 %     Weight 02/12/21 1613 168 lb (76.2 kg)     Height 02/12/21 1613 5\' 6"  (1.676 m)     Head Circumference --      Peak Flow --      Pain Score 02/12/21 1632 4     Pain Loc --      Pain Edu? --      Excl. in GC? --    Constitutional: Alert and oriented. Well appearing and in no acute distress. Eyes: Conjunctivae are normal. PERRL. Head: Atraumatic. Nose: No congestion/rhinnorhea. Mouth/Throat: Mucous membranes are moist. Neck: No stridor Cardiovascular: Grossly normal heart sounds.  Cap refill >4 secs in bilateral lower extremities.  Nondopplerable pulses in right DP and PT Respiratory: Normal respiratory  effort.  No retractions. Gastrointestinal: Soft and nontender. No distention. Musculoskeletal: No obvious deformities Neurologic:  Normal speech and language. No gross focal neurologic deficits are appreciated. Skin:  Skin is warm and dry. No rash noted. Psychiatric: Mood and affect are normal. Speech and behavior are normal.  ____________________________________________   LABS (all labs ordered are listed, but only abnormal results are displayed)  Labs Reviewed  MRSA PCR SCREENING - Abnormal; Notable for the following components:      Result Value   MRSA by PCR POSITIVE (*)    All other components within normal limits  CBC WITH DIFFERENTIAL/PLATELET - Abnormal; Notable for the following components:   Platelets 126 (*)    All other components within normal limits  HEPARIN LEVEL (UNFRACTIONATED) - Abnormal; Notable for the following components:   Heparin Unfractionated <0.10 (*)    All other components within normal limits  CBC WITH DIFFERENTIAL/PLATELET - Abnormal; Notable for the following components:   Platelets 129 (*)    All other components within normal limits  BASIC METABOLIC PANEL - Abnormal; Notable for the following components:   Glucose, Bld 109 (*)    Calcium 8.4 (*)    All other components within normal limits  CBC - Abnormal; Notable for the following components:   HCT 35.0 (*)    Platelets 121 (*)    All other components within normal limits  BASIC METABOLIC PANEL - Abnormal; Notable for the following components:   Glucose, Bld 118 (*)    Calcium 8.7 (*)    All other components within normal limits  HEPARIN LEVEL (UNFRACTIONATED) - Abnormal; Notable for the following components:   Heparin Unfractionated <0.10 (*)    All other components within normal limits  CBC - Abnormal; Notable for the following components:   RBC 3.71 (*)    Hemoglobin 11.4 (*)    HCT 34.3 (*)    Platelets 114 (*)    All other components within normal limits  BASIC METABOLIC PANEL -  Abnormal; Notable for the following components:   Glucose, Bld 105 (*)    Calcium 8.8 (*)    Anion gap 4 (*)    All other components within normal limits  CBC - Abnormal; Notable for the following components:   RBC 3.59 (*)    Hemoglobin 10.9 (*)    HCT 32.8 (*)    Platelets 124 (*)    All other components within normal limits  BASIC METABOLIC PANEL - Abnormal; Notable for the following components:  Glucose, Bld 118 (*)    Calcium 8.7 (*)    All other components within normal limits  RESP PANEL BY RT-PCR (FLU A&B, COVID) ARPGX2  COMPREHENSIVE METABOLIC PANEL  APTT  GLUCOSE, CAPILLARY  TYPE AND SCREEN   ____________________________________________  EKG  ED ECG REPORT I, Merwyn Katos, the attending physician, personally viewed and interpreted this ECG.  Date: 02/12/2021 EKG Time: 1636 Rate: 75 Rhythm: Atrial flutter QRS Axis: normal Intervals: normal ST/T Wave abnormalities: normal Narrative Interpretation: no evidence of acute ischemia  ____________________________________________  RADIOLOGY  ED MD interpretation:  CT angiography of aorto bifemoral system shows Evidence of embolic disease with multiple levels of occlusion within the right lower extremity arterial outflow and runoff. On the right, embolus is noted within the common femoral artery a branching into the profundus femoral artery and superficial femoral artery proximally. Subsequent occlusion of the mid profundus femoral artery. Distal embolization with occlusion of the a terminal superficial femoral artery and P1 segment of the popliteal artery. Distal embolization with occlusion of the P3 segment of the popliteal artery. Three-vessel runoff is identified, but terminates at the level of the a distal tibia and is unclear whether this is related to poor antegrade flow or small particulate embolization. On the left, abrupt occlusion of the a anterior tibial artery proximally and tibioperoneal trunk secondary to  distal embolization. Poor reconstitution of the distal vasculature with nonvisualization of the dorsalis pedis artery and plantar arch  Official radiology report(s): No results found.   ____________________________________________   PROCEDURES  Procedure(s) performed (including Critical Care):  Procedures   ____________________________________________   INITIAL IMPRESSION / ASSESSMENT AND PLAN / ED COURSE  As part of my medical decision making, I reviewed the following data within the electronic MEDICAL RECORD NUMBER Nursing notes reviewed and incorporated, Labs reviewed, EKG interpreted, Old chart reviewed, radiograph reviewed and Notes from prior ED visits reviewed and incorporated        Patient presents for right lower extremity pain history of atrial fibrillation off anticoagulation and severe peripheral vascular disease.  Radiologic evidence reveals multiple sites of embolic disease throughout the lower extremity vasculature bilaterally.  Patient's capillary refill is extremely slow however it seems that she does have some blood flow to this foot.  Sensation is intact.  Low suspicion at this point for cellulitis, DVT, or musculoskeletal injury.  Spoke to on-call vascular surgery who agrees to accept this patient onto their service after going to the OR.      ____________________________________________   FINAL CLINICAL IMPRESSION(S) / ED DIAGNOSES  Final diagnoses:  Right leg pain  Arterial embolism of right leg [facility])     ED Discharge Orders          Ordered    polyethylene glycol (MIRALAX / GLYCOLAX) 17 g packet  Daily        02/16/21 1026    atorvastatin (LIPITOR) 20 MG tablet  Daily        02/16/21 1026    apixaban (ELIQUIS) 5 MG TABS tablet  2 times daily        02/16/21 1026    aspirin EC 81 MG EC tablet  Daily        02/16/21 1026    traMADol (ULTRAM) 50 MG tablet  Every 8 hours PRN        02/16/21 1026    Discharge instructions       Comments: Please be  sure to follow-up with the vascular surgeons office in 2 weeks time.  Call for an appointment. Seek attention if you notice any bleeding from any site.  You were cared for by a hospitalist during your hospital stay. If you have any questions about your discharge medications or the care you received while you were in the hospital after you are discharged, you can call the unit and asked to speak with the hospitalist on call if the hospitalist that took care of you is not available. Once you are discharged, your primary care physician will handle any further medical issues. Please note that NO REFILLS for any discharge medications will be authorized once you are discharged, as it is imperative that you return to your primary care physician (or establish a relationship with a primary care physician if you do not have one) for your aftercare needs so that they can reassess your need for medications and monitor your lab values. If you do not have a primary care physician, you can call [phone] for a physician referral.   02/16/21 1026    Increase activity slowly        02/16/21 1026    Diet - low sodium heart healthy        02/16/21 1026    No wound care        02/16/21 1026    Call MD for:  temperature >100.4        02/16/21 1026    Call MD for:  persistant nausea and vomiting        02/16/21 1026    Call MD for:  severe uncontrolled pain        02/16/21 1026    Call MD for:  difficulty breathing, headache or visual disturbances        02/16/21 1026    Call MD for:  persistant dizziness or light-headedness        02/16/21 1026    Call MD for:  extreme fatigue        02/16/21 1026    Call MD for:  redness, tenderness, or signs of infection (pain, swelling, redness, odor or green/yellow discharge around incision site)        02/16/21 1026             Note:  This document was prepared using Dragon voice recognition software and may include unintentional dictation errors.   Merwyn KatosBradler, Layloni Fahrner  K, MD 02/26/21 [phone]

## 2021-02-12 NOTE — ED Notes (Signed)
Consulting provider at bedside

## 2021-02-12 NOTE — Consult Note (Signed)
[facility] VEIN [facility] Vascular Consult Note  MRN : [id number]  Alison Russo is a 87 y.o. (05-19-1926) female who presents with chief complaint of  Chief Complaint  Patient presents with  . Leg Pain  .  History of Present Illness:   I am asked to evaluate the patient by Dr. Ephriam Jenkins.  The patient is a 87 year old who presented to [facility] at 2:45 PM this afternoon for evaluation of a painful right lower extremity.  Patient noted the pain began approximately 2 days ago.  Patient notes that the right leg is cold and numb.  She was evaluated in the emergency room and then told to go to the ER.  I was paged at 8:30 PM and informed the patient had had a CT angiogram which demonstrated thrombus within the right common femoral as well as distal SFA and tibioperoneal trunk.  On evaluation in the emergency room patient notes her leg continues to be very cold feeling and painful.  No outpatient medications have been marked as taking for the 02/12/21 encounter [facility] Encounter).    Past Medical History:  Diagnosis Date  . Anginal pain (HCC)   . CAD (coronary artery disease)   . Heart murmur   . Hypertension   . Mood disorder (HCC)    mostly anxiety  . NSTEMI (non-ST elevated myocardial infarction) (HCC) 12/22/2017  . OSA on CPAP   . PE (pulmonary embolism)    recurrent    Past Surgical History:  Procedure Laterality Date  . BREAST CYST ASPIRATION Right    neg  . CARDIAC CATHETERIZATION    . CATARACT EXTRACTION, BILATERAL    . COLONOSCOPY    . CORONARY STENT INTERVENTION N/A 12/23/2017   Procedure: CORONARY STENT INTERVENTION;  Surgeon: Swaziland, Peter M, MD;  Location: [facility] INVASIVE CV LAB;  Service: Cardiovascular;  Laterality: N/A;  . EYE SURGERY Left    "blood vessel busted behind my eye"  . EYE SURGERY Right    ?vitrectomy  . HYSTEROSCOPY WITH D & C N/A 10/25/2016   Procedure: DILATATION AND CURETTAGE /HYSTEROSCOPY, POLYPECTOMY;  Surgeon: Christeen Douglas, MD;   Location: [facility] ORS;  Service: Gynecology;  Laterality: N/A;  . INGUINAL HERNIA REPAIR Right 1950s  . INTRAMEDULLARY (IM) NAIL INTERTROCHANTERIC Right 03/27/2020   Procedure: INTRAMEDULLARY (IM) NAIL INTERTROCHANTRIC;  Surgeon: Juanell Fairly, MD;  Location: [facility] ORS;  Service: Orthopedics;  Laterality: Right;  . LEFT HEART CATH AND CORONARY ANGIOGRAPHY N/A 12/21/2017   Procedure: LEFT HEART CATH AND CORONARY ANGIOGRAPHY possible PCI and stent;  Surgeon: Alwyn Pea, MD;  Location: [facility] INVASIVE CV LAB;  Service: Cardiovascular;  Laterality: N/A;  . LOWER EXTREMITY ANGIOGRAPHY Right 01/11/2018   Procedure: LOWER EXTREMITY ANGIOGRAPHY;  Surgeon: Annice Needy, MD;  Location: [facility] INVASIVE CV LAB;  Service: Cardiovascular;  Laterality: Right;  . PSEUDOANERYSM COMPRESSION Left 01/20/2018   Procedure: PSEUDOANERYSM COMPRESSION;  Surgeon: Renford Dills, MD;  Location: [facility] INVASIVE CV LAB;  Service: Cardiovascular;  Laterality: Left;  . TONSILLECTOMY      Social History Social History   Tobacco Use  . Smoking status: Never Smoker  . Smokeless tobacco: Never Used  Vaping Use  . Vaping Use: Never used  Substance Use Topics  . Alcohol use: Never  . Drug use: Never    Family History Family History  Problem Relation Age of Onset  . Breast cancer Mother 100  . AAA (abdominal aortic aneurysm) Father        ruptured  . Coronary  artery disease Brother   No family history of bleeding/clotting disorders, porphyria or autoimmune disease   No Known Allergies   REVIEW OF SYSTEMS (Negative unless checked)  Constitutional: [] Weight loss  [] Fever  [] Chills Cardiac: [] Chest pain   [] Chest pressure   [] Palpitations   [] Shortness of breath when laying flat   [] Shortness of breath at rest   [] Shortness of breath with exertion. Vascular:  [] Pain in legs with walking   [x] Pain in legs at rest   [] Pain in legs when laying flat   [] Claudication   [] Pain in feet when walking  [] Pain in feet at rest   [] Pain in feet when laying flat   [] History of DVT   [] Phlebitis   [] Swelling in legs   [] Varicose veins   [] Non-healing ulcers Pulmonary:   [] Uses home oxygen   [] Productive cough   [] Hemoptysis   [] Wheeze  [] COPD   [] Asthma Neurologic:  [] Dizziness  [] Blackouts   [] Seizures   [] History of stroke   [] History of TIA  [] Aphasia   [] Temporary blindness   [] Dysphagia   [] Weakness or numbness in arms   [] Weakness or numbness in legs Musculoskeletal:  [] Arthritis   [] Joint swelling   [x] Joint pain   [x] Low back pain Hematologic:  [] Easy bruising  [] Easy bleeding   [] Hypercoagulable state   [] Anemic  [] Hepatitis Gastrointestinal:  [] Blood in stool   [] Vomiting blood  [] Gastroesophageal reflux/heartburn   [] Difficulty swallowing. Genitourinary:  [] Chronic kidney disease   [] Difficult urination  [] Frequent urination  [] Burning with urination   [] Blood in urine Skin:  [] Rashes   [] Ulcers   [] Wounds Psychological:  [] History of anxiety   []  History of major depression.  Physical Examination  Vitals:   02/12/21 1613 02/12/21 1812 02/12/21 2100 02/12/21 2127  BP: (!) 157/101 (!) 167/93 (!) 174/108 (!) 162/118  Pulse: 80 75 78 70  Resp: 20 16 16 16   Temp: 97.8 F (36.6 C)     TempSrc: Oral     SpO2: 97% 96% 97% 98%  Weight: 76.2 kg     Height: 5\' 6"  (1.676 m)      Body mass index is 27.12 kg/m. Gen:  WD/WN, NAD Head: [place]/AT, No temporalis wasting. Prominent temp pulse not noted. Ear/Nose/Throat: Hearing grossly intact, nares w/o erythema or drainage, oropharynx w/o Erythema/Exudate Eyes: PERRLA, EOMI.  Neck: Supple, no nuchal rigidity.  No bruit or JVD.  Pulmonary:  Good air movement, clear to auscultation bilaterally.  Cardiac: RRR, normal S1, S2, no Murmurs, rubs or gallops. Vascular: Right foot is cyanotic it is cold the skin is somewhat waxy and hardened Vessel Right Left  Radial Palpable Palpable  PT  not palpable Palpable  DP  not palpable Palpable   Gastrointestinal: soft,  non-tender/non-distended. No guarding/reflex. No masses, surgical incisions, or scars. Musculoskeletal: M/S 5/5 throughout upper extremities right lower extremity is decreased motor and no sensory.  Right lower extremity with ischemic changes.  No deformity or atrophy. No edema. Neurologic: CN 2-12 intact.  Speech is fluent. Motor exam as listed above. Psychiatric: Judgment intact, Mood & affect appropriate for pt's clinical situation. Dermatologic: No rashes or ulcers noted.  No cellulitis or open wounds.    CBC Lab Results  Component Value Date   WBC 7.2 02/12/2021   HGB 13.6 02/12/2021   HCT 40.9 02/12/2021   MCV 92.1 02/12/2021   PLT 126 (L) 02/12/2021    BMET    Component Value Date/Time   NA 138 02/12/2021 1643   NA 143 09/28/2017  1259   K 4.3 02/12/2021 1643   CL 106 02/12/2021 1643   CO2 25 02/12/2021 1643   GLUCOSE 87 02/12/2021 1643   BUN 15 02/12/2021 1643   BUN 16 09/28/2017 1259   CREATININE 0.60 02/12/2021 1643   CALCIUM 9.2 02/12/2021 1643   GFRNONAA >60 02/12/2021 1643   GFRAA >60 05/23/2020 1820   Estimated Creatinine Clearance: 45.8 mL/min (by C-G formula based on SCr of 0.6 mg/dL).  COAG Lab Results  Component Value Date   INR 1.6 (H) 03/27/2020   INR 1.1 03/26/2020   INR 1.29 01/20/2018    Radiology CT Angio Aortobifemoral W and/or Wo Contrast  Result Date: 02/12/2021 CLINICAL DATA:  Right leg pain and cramping EXAM: CT ANGIOGRAPHY OF ABDOMINAL AORTA WITH ILIOFEMORAL RUNOFF TECHNIQUE: Multidetector CT imaging of the abdomen, pelvis and lower extremities was performed using the standard protocol during bolus administration of intravenous contrast. Multiplanar CT image reconstructions and MIPs were obtained to evaluate the vascular anatomy. CONTRAST:  OMNIPAQUE IOHEXOL 350 MG/ML SOLN COMPARISON:  None. FINDINGS: VASCULAR Aorta: The visualized distal thoracic aorta is of normal caliber. The abdominal aorta is of normal caliber. No evidence of  aneurysm or dissection. Extensive atherosclerotic calcification without evidence of hemodynamically significant stenosis. No significant ulcerated or soft plaque identified. No periaortic inflammatory change. 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 Distally widely patent without evidence of aneurysm or dissection. Normal anatomic configuration. SMA: Mild atherosclerotic calcification within the proximal segment. Widely patent. No aneurysm or dissection. Renals: Less than 50% stenosis of the right renal artery at its origin. Single renal arteries bilaterally. Normal vascular morphology. No aneurysm. IMA: Less than 50% stenosis at its origin.  Distally widely patent. RIGHT Lower Extremity Inflow: Extensive atherosclerotic calcification. No significant soft or ulcerated plaque. No hemodynamically significant stenosis. Internal iliac artery is patent at its origin. No aneurysm or dissection. Outflow: There is a a branching intraluminal filling defect within the right common femoral artery extending into the proximal profundus and superficial femoral artery in keeping with an arterial embolus. This results in segmental near occlusion of the common femoral artery. The profundus femoral artery, distally, is abruptly occluded beyond the a second muscular branch related to distal embolization and is subsequently reconstituted. The superficial femoral artery is segmentally occluded at the level of the adductor hiatus secondary to distal embolization with the embolus extending through the P1 segment of the popliteal artery. Runoff: There is, again, distal embolization with occlusion of the P3 segment of the a popliteal artery with subsequent reconstitution of the a three-vessel runoff. The anterior tibial artery terminates at the level of the distal tibial diaphysis. The posterior tibial artery demonstrates occlusion at the level of the tibial plafond and. The peroneal artery terminates at the  level of the distal tibial diaphysis LEFT Lower Extremity Inflow: Extensive atherosclerotic calcification. No significant soft or ulcerated plaque. No aneurysm or dissection. No evidence of hemodynamically significant stenosis. Internal iliac arteries patent proximally. Outflow: Scattered atherosclerotic plaque throughout the superficial femoral artery without hemodynamically significant stenosis. Lower extremity arterial outflow is otherwise widely patent. Runoff: Popliteal artery is widely patent. There is, however, embolic occlusion of the tibioperoneal trunk beyond the takeoff of the anterior tibial artery in keeping with distal embolization. Similarly, there is abrupt occlusion of the anterior tibial artery proximally secondary to embolic disease. Relatively weak distal reconstitution without opacification of the dorsalis pedis artery and tibioperoneal trunk. Veins: No obvious venous abnormality within the limitations of this  arterial phase study. Review of the MIP images confirms the above findings. NON-VASCULAR Lower chest: Cardiac size is mildly enlarged. Visualized lung bases are clear. Moderate hiatal hernia. Hepatobiliary: Liver contour is nodular in keeping with changes of cirrhosis. Cholelithiasis without pericholecystic inflammatory change identified. Pancreas: 18 mm cystic lesion of the identified within the mid body of the pancreas, indeterminate. This may represent a simple pancreatic cyst, side branch ectasia, pseudocyst, or a cystic neoplasm. This is slightly enlarged from prior examination of 01/19/2018 (previously measuring 16 mm) Spleen: Unremarkable Adrenals/Urinary Tract: The adrenal glands are unremarkable. Mild cortical scarring within the right kidney. The kidneys are otherwise unremarkable. The bladder demonstrates multiple small diverticula suggesting changes of a bladder outlet obstruction. The bladder is not distended. Stomach/Bowel: Severe sigmoid diverticulosis. Moderate stool  throughout the colon. The stomach, small bowel, and large bowel are otherwise unremarkable. No evidence of obstruction or focal inflammation. The appendix is not visualized and is likely absent. Lymphatic: No pathologic adenopathy within the abdomen and pelvis. Reproductive: Involuted calcified fibroid within the uterus. The pelvic organs are otherwise unremarkable. Other: Small fat containing left inguinal hernia. Musculoskeletal: Right hip ORIF has been performed utilizing a gamma nail device. Osseous structures are diffusely osteopenic. No acute bone abnormality. No lytic or blastic bone lesion. IMPRESSION: VASCULAR Evidence of embolic disease with multiple levels of occlusion within the right lower extremity arterial outflow and runoff. On the right, embolus is noted within the common femoral artery a branching into the profundus femoral artery and superficial femoral artery proximally. Subsequent occlusion of the mid profundus femoral artery. Distal embolization with occlusion of the a terminal superficial femoral artery and P1 segment of the popliteal artery. Distal embolization with occlusion of the P3 segment of the popliteal artery. Three-vessel runoff is identified, but terminates at the level of the a distal tibia and is unclear whether this is related to poor antegrade flow or small particulate embolization. On the left, abrupt occlusion of the a anterior tibial artery proximally and tibioperoneal trunk secondary to distal embolization. Poor reconstitution of the distal vasculature with nonvisualization of the dorsalis pedis artery and plantar arch. 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 No identified embolic source within the abdominal aorta and lower extremity arterial inflow. NON-VASCULAR Mild cardiomegaly. Moderate hiatal hernia. Morphologic changes in keeping with cirrhosis. 18 mm cystic lesion within the mid body of the pancreas, indeterminate. This demonstrates slight  interval growth since prior examination. Differential considerations are as listed above. Sigmoid diverticulosis. Aortic Atherosclerosis (ICD10-I70.0). These results were called by telephone at the time of interpretation on 02/12/2021 at 8:26 pm to provider [facility] , who verbally acknowledged these results. Electronically Signed   By: Helyn Numbers MD   On: 02/12/2021 20:30   US Venous Img Lower Unilateral Right  Result Date: 02/12/2021 CLINICAL DATA:  Leg pain and swelling right EXAM: Right LOWER EXTREMITY VENOUS DOPPLER ULTRASOUND TECHNIQUE: Gray-scale sonography with compression, as well as color and duplex ultrasound, were performed to evaluate the deep venous system(s) from the level of the common femoral vein through the popliteal and proximal calf veins. COMPARISON:  05/16/2020, 11/19/2019 FINDINGS: VENOUS Normal compressibility of the common femoral, superficial femoral, and popliteal veins, as well as the visualized calf veins. Slightly limited visualization of the distal femoral vein. Visualized portions of profunda femoral vein and great saphenous vein unremarkable. No filling defects to suggest DVT on grayscale or color Doppler imaging. Doppler waveforms show normal direction of venous flow, normal respiratory plasticity  and response to augmentation. Limited views of the contralateral common femoral vein are unremarkable. OTHER Limited images of right popliteal artery below the knee demonstrate occlusion. Limitations: Small caliber vessels at and calcific disease of the arteries. IMPRESSION: 1. Negative for acute right lower extremity DVT 2. Findings concerning for occlusion/thrombus within the right popliteal artery. Critical Value/emergent results were called by telephone at the time of interpretation on 02/12/2021 at 5:51 pm to provider Shaune Pollack , who verbally acknowledged these results. Electronically Signed   By: Jasmine Pang M.D.   On: 02/12/2021 17:51     Assessment/Plan 1.   Acute right lower extremity ischemia: The patient has an acutely ischemic leg that is threatened.  She is lost sensory and motor function.  Her foot is cold and her skin is becoming waxy.  This requires emergent intervention as we are well beyond the 6-hour interval for limb salvage.  Heparin has been initiated and I will plan to take her to special procedures for thrombectomy as soon as the lab is ready.  Risks and benefits of been reviewed with the patient all questions are answered and the patient has agreed to proceed.  She has been informed that she is at high risk for losing her limb.  2.  Atrial fibrillation: This is the most likely source for him her embolization.  Post procedure we will maintain her on anticoagulation.  Further plans will be based on input per cardiology service.  3.  Hypertension: Continue antihypertensive medications as already ordered, these medications have been reviewed and there are no changes at this time.  4.  Hyperlipidemia: Continue statin as ordered and reviewed, no changes at this time     Levora Dredge, MD  02/12/2021 9:43 PM

## 2021-02-12 NOTE — ED Notes (Signed)
Patient reports intermittent pain to the RLE, worse to the posterior calf x 3 days. Patient reports swelling to the right calf, and difficulty ambulating. Patient reports she lives alone, and is independent with ADLs, and occasionally uses a walker for ambulation. Patient reports pain to the RLE worsened today. Patient reports hx of surgery to right hip/right femur in 2021.

## 2021-02-12 NOTE — ED Notes (Signed)
ED provider with patient. Patient updated on POC.

## 2021-02-12 NOTE — ED Notes (Signed)
Patient assisted to and from restroom. Patient reports soiling herself. Patient provided brief, and a pair of disposable pants. Patient returned to [facility].

## 2021-02-12 NOTE — ED Notes (Signed)
Recollect on coags sent to lab at this time.

## 2021-02-12 NOTE — ED Notes (Signed)
Heparin initiated by this RN and Fredric Mare, Charity fundraiser. Patient provided information on blood thinner use, and risks, as well as potential side affects to report to RN. External cath placed to assist patient with urination. Friend at bedside notified of pending admission, and all questions answered.

## 2021-02-12 NOTE — Consult Note (Signed)
ANTICOAGULATION CONSULT NOTE - Consult  Pharmacy Consult for Heparin gtt Indication: arterial occlusion (occlusion/thrombus within the right popliteal artery.)  No Known Allergies  Patient Measurements: Height: 5\' 6"  (167.6 cm) Weight: 76.2 kg (168 lb) IBW/kg (Calculated) : 59.3 Heparin Dosing Weight: 74.7kg  Vital Signs: Temp: 97.8 F (36.6 C) (05/26 1613) Temp Source: Oral (05/26 1613) BP: 167/93 (05/26 1812) Pulse Rate: 75 (05/26 1812)  Labs: Recent Labs    02/12/21 1643  HGB 13.6  HCT 40.9  PLT 126*  CREATININE 0.60    Estimated Creatinine Clearance: 45.8 mL/min (by C-G formula based on SCr of 0.6 mg/dL).   Medications:  NKDA & Heparin Dosing Weight: 74.7kg PTA: Eliquis 5mg  BID (pt reports not taking since July 2021.) Inpatient: no current orders. Starting hep gtt.  Assessment: 87yo female w/ h/o CAD, HTN, Afib (on eliquis), anemia, MDD, OA, & rt femur repair in 2021 presenting with new rt calf/leg pain a/w swelling and redness that started on 02/10/2021.   Date Time aPTT/HL Rate/Comment       Baseline Labs: aPTT - sent/pending Anti-Xa - sent/ pending  Hgb - 13.6 Plts - 126     Imaging: 5/26 LE 02/12/2021: Findings concerning for occlusion/thrombus within the right popliteal artery. 5/26 CT angio: pending result  Goal of Therapy:  Heparin level 0.3-0.7 units/ml aPTT 66-102 seconds Monitor platelets by anticoagulation protocol: Yes   Plan:  Pt reports not taking Eliquis since July of 2021; no recent fill history corroborates this. Will order baseline aPTT and anti-Xa to confirm. Given age and new clot will begin with bolus and not delay start.  Give 4000 units bolus x 1 Start heparin infusion at 1250 units/hr Check aPTT level in 8 hours and daily while on heparin Continue to monitor H&H and platelets  6/26 Donie Moulton 02/12/2021,6:45 PM

## 2021-02-12 NOTE — ED Notes (Signed)
Report to special surgery RN at bedside. Patient to OR at this time.

## 2021-02-12 NOTE — ED Notes (Signed)
Patient to OR - Heparin infusion handoff to specials surgery RN.

## 2021-02-12 NOTE — ED Notes (Signed)
Warm blanket and pillow provided

## 2021-02-12 NOTE — ED Triage Notes (Signed)
Pt to ER via POV with complaints of right calf/ leg pain and cramping that started 5/24. Reports today the muscle in her leg is hurting and she is having difficulty ambulating. Pt reports she had been previously taking a blood thinner but hasn't been taking this since July. Hx of femur fracture on right leg.   States she went to the walk in clinic where they measured her right calf and it was approx 1inch larger in circumference. Pt also with elevated blood pressure, states she has been having issues with this.

## 2021-02-12 NOTE — ED Notes (Signed)
Patient is resting comfortably. Patient and friend at bedside updated on POC.

## 2021-02-12 NOTE — ED Notes (Signed)
Patient discussed with Dr Erma Heritage.

## 2021-02-13 DIAGNOSIS — R011 Cardiac murmur, unspecified: Secondary | ICD-10-CM | POA: Diagnosis present

## 2021-02-13 DIAGNOSIS — I10 Essential (primary) hypertension: Secondary | ICD-10-CM | POA: Diagnosis present

## 2021-02-13 DIAGNOSIS — Z825 Family history of asthma and other chronic lower respiratory diseases: Secondary | ICD-10-CM | POA: Diagnosis not present

## 2021-02-13 DIAGNOSIS — I82431 Acute embolism and thrombosis of right popliteal vein: Secondary | ICD-10-CM | POA: Diagnosis present

## 2021-02-13 DIAGNOSIS — Z86711 Personal history of pulmonary embolism: Secondary | ICD-10-CM | POA: Diagnosis not present

## 2021-02-13 DIAGNOSIS — I998 Other disorder of circulatory system: Secondary | ICD-10-CM | POA: Diagnosis not present

## 2021-02-13 DIAGNOSIS — S8012XA Contusion of left lower leg, initial encounter: Secondary | ICD-10-CM | POA: Diagnosis present

## 2021-02-13 DIAGNOSIS — Z79899 Other long term (current) drug therapy: Secondary | ICD-10-CM | POA: Diagnosis not present

## 2021-02-13 DIAGNOSIS — Z7901 Long term (current) use of anticoagulants: Secondary | ICD-10-CM | POA: Diagnosis not present

## 2021-02-13 DIAGNOSIS — Z803 Family history of malignant neoplasm of breast: Secondary | ICD-10-CM | POA: Diagnosis not present

## 2021-02-13 DIAGNOSIS — E785 Hyperlipidemia, unspecified: Secondary | ICD-10-CM | POA: Diagnosis present

## 2021-02-13 DIAGNOSIS — G4733 Obstructive sleep apnea (adult) (pediatric): Secondary | ICD-10-CM | POA: Diagnosis present

## 2021-02-13 DIAGNOSIS — I482 Chronic atrial fibrillation, unspecified: Secondary | ICD-10-CM

## 2021-02-13 DIAGNOSIS — Z8249 Family history of ischemic heart disease and other diseases of the circulatory system: Secondary | ICD-10-CM | POA: Diagnosis not present

## 2021-02-13 DIAGNOSIS — F419 Anxiety disorder, unspecified: Secondary | ICD-10-CM | POA: Diagnosis present

## 2021-02-13 DIAGNOSIS — I743 Embolism and thrombosis of arteries of the lower extremities: Secondary | ICD-10-CM | POA: Diagnosis present

## 2021-02-13 DIAGNOSIS — M79606 Pain in leg, unspecified: Secondary | ICD-10-CM | POA: Diagnosis present

## 2021-02-13 DIAGNOSIS — I25110 Atherosclerotic heart disease of native coronary artery with unstable angina pectoris: Secondary | ICD-10-CM | POA: Diagnosis not present

## 2021-02-13 DIAGNOSIS — Z20822 Contact with and (suspected) exposure to covid-19: Secondary | ICD-10-CM | POA: Diagnosis present

## 2021-02-13 DIAGNOSIS — L7622 Postprocedural hemorrhage and hematoma of skin and subcutaneous tissue following other procedure: Secondary | ICD-10-CM | POA: Diagnosis not present

## 2021-02-13 DIAGNOSIS — I252 Old myocardial infarction: Secondary | ICD-10-CM | POA: Diagnosis not present

## 2021-02-13 DIAGNOSIS — Z955 Presence of coronary angioplasty implant and graft: Secondary | ICD-10-CM | POA: Diagnosis not present

## 2021-02-13 DIAGNOSIS — I480 Paroxysmal atrial fibrillation: Secondary | ICD-10-CM | POA: Diagnosis present

## 2021-02-13 DIAGNOSIS — Z86718 Personal history of other venous thrombosis and embolism: Secondary | ICD-10-CM | POA: Diagnosis not present

## 2021-02-13 DIAGNOSIS — I70209 Unspecified atherosclerosis of native arteries of extremities, unspecified extremity: Secondary | ICD-10-CM | POA: Diagnosis present

## 2021-02-13 DIAGNOSIS — I2510 Atherosclerotic heart disease of native coronary artery without angina pectoris: Secondary | ICD-10-CM | POA: Diagnosis present

## 2021-02-13 LAB — CBC WITH DIFFERENTIAL/PLATELET
Abs Immature Granulocytes: 0.03 10*3/uL (ref 0.00–0.07)
Basophils Absolute: 0 10*3/uL (ref 0.0–0.1)
Basophils Relative: 0 %
Eosinophils Absolute: 0 10*3/uL (ref 0.0–0.5)
Eosinophils Relative: 0 %
HCT: 37.2 % (ref 36.0–46.0)
Hemoglobin: 12.6 g/dL (ref 12.0–15.0)
Immature Granulocytes: 0 %
Lymphocytes Relative: 12 %
Lymphs Abs: 1.1 10*3/uL (ref 0.7–4.0)
MCH: 31 pg (ref 26.0–34.0)
MCHC: 33.9 g/dL (ref 30.0–36.0)
MCV: 91.6 fL (ref 80.0–100.0)
Monocytes Absolute: 0.7 10*3/uL (ref 0.1–1.0)
Monocytes Relative: 7 %
Neutro Abs: 7.2 10*3/uL (ref 1.7–7.7)
Neutrophils Relative %: 81 %
Platelets: 129 10*3/uL — ABNORMAL LOW (ref 150–400)
RBC: 4.06 MIL/uL (ref 3.87–5.11)
RDW: 13.4 % (ref 11.5–15.5)
WBC: 9 10*3/uL (ref 4.0–10.5)
nRBC: 0.2 % (ref 0.0–0.2)

## 2021-02-13 LAB — BASIC METABOLIC PANEL
Anion gap: 7 (ref 5–15)
BUN: 12 mg/dL (ref 8–23)
CO2: 25 mmol/L (ref 22–32)
Calcium: 8.4 mg/dL — ABNORMAL LOW (ref 8.9–10.3)
Chloride: 105 mmol/L (ref 98–111)
Creatinine, Ser: 0.76 mg/dL (ref 0.44–1.00)
GFR, Estimated: >60 mL/min (ref >60)
Glucose, Bld: 109 mg/dL — ABNORMAL HIGH (ref 70–99)
Potassium: 4.4 mmol/L (ref 3.5–5.1)
Sodium: 137 mmol/L (ref 135–145)

## 2021-02-13 LAB — MRSA PCR SCREENING: MRSA by PCR: POSITIVE — AB

## 2021-02-13 LAB — GLUCOSE, CAPILLARY: Glucose-Capillary: 98 mg/dL (ref 70–99)

## 2021-02-13 MED ORDER — CHLORHEXIDINE GLUCONATE CLOTH 2 % EX PADS
6.0000 | MEDICATED_PAD | Freq: Every day | CUTANEOUS | Status: DC
  Administered 2021-02-13 – 2021-02-16 (×4): 6 via TOPICAL

## 2021-02-13 MED ORDER — ONDANSETRON HCL 4 MG/2ML IJ SOLN
4.0000 mg | Freq: Four times a day (QID) | INTRAMUSCULAR | Status: DC | PRN
  Administered 2021-02-13: 4 mg via INTRAVENOUS
  Filled 2021-02-13: qty 2

## 2021-02-13 MED ORDER — TRAMADOL HCL 50 MG PO TABS: 50.0000 mg | ORAL_TABLET | Freq: Three times a day (TID) | ORAL | Status: DC | PRN

## 2021-02-13 MED ORDER — PANTOPRAZOLE SODIUM 40 MG IV SOLR
40.0000 mg | Freq: Every day | INTRAVENOUS | Status: DC
  Administered 2021-02-13 – 2021-02-15 (×4): 40 mg via INTRAVENOUS
  Filled 2021-02-13 (×4): qty 40

## 2021-02-13 MED ORDER — HEPARIN (PORCINE) 25000 UT/250ML-% IV SOLN
1250.0000 [IU]/h | INTRAVENOUS | Status: DC
  Administered 2021-02-14: 1250 [IU]/h via INTRAVENOUS
  Filled 2021-02-13: qty 250

## 2021-02-13 MED ORDER — SODIUM CHLORIDE 0.9 % IV SOLN
INTRAVENOUS | Status: DC
  Administered 2021-02-13: 75 mL/h via INTRAVENOUS

## 2021-02-13 MED ORDER — POLYETHYLENE GLYCOL 3350 17 G PO PACK
17.0000 g | PACK | Freq: Every day | ORAL | Status: DC
  Administered 2021-02-13 – 2021-02-15 (×3): 17 g via ORAL
  Filled 2021-02-13 (×4): qty 1

## 2021-02-13 MED ORDER — IODIXANOL 320 MG/ML IV SOLN
INTRAVENOUS | Status: DC | PRN
  Administered 2021-02-13: 40 mL

## 2021-02-13 MED ORDER — SODIUM CHLORIDE 0.9% FLUSH: 3.0000 mL | INTRAVENOUS | Status: DC | PRN

## 2021-02-13 MED ORDER — ACETAMINOPHEN 325 MG PO TABS
650.0000 mg | ORAL_TABLET | ORAL | Status: DC | PRN
  Administered 2021-02-13 – 2021-02-14 (×2): 650 mg via ORAL
  Filled 2021-02-13 (×2): qty 2

## 2021-02-13 MED ORDER — METOPROLOL TARTRATE 25 MG PO TABS
25.0000 mg | ORAL_TABLET | Freq: Two times a day (BID) | ORAL | Status: DC
  Administered 2021-02-13 – 2021-02-16 (×7): 25 mg via ORAL
  Filled 2021-02-13 (×8): qty 1

## 2021-02-13 MED ORDER — APIXABAN 5 MG PO TABS
5.0000 mg | ORAL_TABLET | Freq: Two times a day (BID) | ORAL | Status: DC
  Filled 2021-02-13: qty 1

## 2021-02-13 MED ORDER — SODIUM CHLORIDE 0.9 % IV SOLN: 250.0000 mL | INTRAVENOUS | Status: DC | PRN

## 2021-02-13 MED ORDER — OXYCODONE HCL 5 MG PO TABS
5.0000 mg | ORAL_TABLET | ORAL | Status: DC | PRN
  Administered 2021-02-13 (×2): 5 mg via ORAL
  Filled 2021-02-13 (×2): qty 1

## 2021-02-13 MED ORDER — SODIUM CHLORIDE 0.9% FLUSH
3.0000 mL | Freq: Two times a day (BID) | INTRAVENOUS | Status: DC
  Administered 2021-02-13 – 2021-02-16 (×6): 3 mL via INTRAVENOUS

## 2021-02-13 MED ORDER — MORPHINE SULFATE (PF) 4 MG/ML IV SOLN: 2.0000 mg | INTRAVENOUS | Status: DC | PRN

## 2021-02-13 MED ORDER — HEPARIN (PORCINE) 25000 UT/250ML-% IV SOLN
1250.0000 [IU]/h | INTRAVENOUS | Status: AC
  Administered 2021-02-13: 1250 [IU]/h via INTRAVENOUS

## 2021-02-13 MED ORDER — SERTRALINE HCL 50 MG PO TABS
100.0000 mg | ORAL_TABLET | Freq: Every day | ORAL | Status: DC
  Administered 2021-02-13 – 2021-02-16 (×4): 100 mg via ORAL
  Filled 2021-02-13 (×4): qty 2

## 2021-02-13 MED ORDER — DOCUSATE SODIUM 100 MG PO CAPS: 100.0000 mg | ORAL_CAPSULE | Freq: Two times a day (BID) | ORAL | Status: DC | PRN

## 2021-02-13 MED ORDER — MUPIROCIN 2 % EX OINT
1.0000 "application " | TOPICAL_OINTMENT | Freq: Two times a day (BID) | CUTANEOUS | Status: DC
  Administered 2021-02-13 – 2021-02-16 (×6): 1 via NASAL
  Filled 2021-02-13 (×2): qty 22

## 2021-02-13 NOTE — Consult Note (Signed)
ANTICOAGULATION CONSULT NOTE - Consult  Pharmacy Consult for Heparin gtt Indication: arterial occlusion (occlusion/thrombus within the right popliteal artery.)  No Known Allergies  Patient Measurements: Height: 5\' 6"  (167.6 cm) Weight: 78 kg (171 lb 15.3 oz) IBW/kg (Calculated) : 59.3 Heparin Dosing Weight: 74.7kg  Vital Signs: Temp: 98.2 F (36.8 C) (05/27 0103) Temp Source: Oral (05/27 0103) BP: 112/78 (05/27 0103) Pulse Rate: 94 (05/27 0103)  Labs: Recent Labs    02/12/21 1643 02/12/21 2005  HGB 13.6  --   HCT 40.9  --   PLT 126*  --   APTT  --  28  HEPARINUNFRC  --  <0.10*  CREATININE 0.60  --     Estimated Creatinine Clearance: 46.3 mL/min (by C-G formula based on SCr of 0.6 mg/dL).   Medications:  NKDA & Heparin Dosing Weight: 74.7kg PTA: Eliquis 5mg  BID (pt reports not taking since July 2021.) Inpatient: no current orders. Starting hep gtt.  Assessment: 87yo female w/ h/o CAD, HTN, Afib (on eliquis), anemia, MDD, OA, & rt femur repair in 2021 presenting with new rt calf/leg pain a/w swelling and redness that started on 02/10/2021.   Date Time aPTT/HL Rate/Comment       Baseline Labs: aPTT - sent/pending Anti-Xa - sent/ pending  Hgb - 13.6 Plts - 126     Imaging: 5/26 LE 02/12/2021: Findings concerning for occlusion/thrombus within the right popliteal artery. 5/26 CT angio: pending result  Goal of Therapy:  Heparin level 0.3-0.7 units/ml aPTT 66-102 seconds Monitor platelets by anticoagulation protocol: Yes   Plan:  Pt reports not taking Eliquis since July of 2021; no recent fill history corroborates this. Will order baseline aPTT and anti-Xa to confirm. Given age and new clot will begin with bolus and not delay start.  Give 4000 units bolus x 1 Start heparin infusion at 1250 units/hr Check aPTT level in 8 hours and daily while on heparin Continue to monitor H&H and platelets  5/27:  Heparin gtt was d/c'd on 5/26 @ 2220 for vascular surgery.   Restarted @ 0100 per Dr 6/26 at previous rate.  OK to use nomogram to dose.  Will restart heparin @ 1250 units/hr and recheck HL 8 hrs after restart.   Nimrit Kehres D 02/13/2021,1:48 AM

## 2021-02-13 NOTE — Progress Notes (Signed)
Patient arrived to the unit via bed accompanied by cath lab nurse. Report received. Small amount of bloody drainage present in anterior PAD dome on arrival. DP pulses palpable and marked for reassessment. Patient rates pain 5/10. Unable to transfer patient in at this time for medication administration. Daughter allowed to visit at the bedside. Will continue to monitor.

## 2021-02-13 NOTE — Progress Notes (Signed)
Patient BP 102/53. Scheduled Metoprolol held. Will continue to monitor.

## 2021-02-13 NOTE — H&P (Addendum)
NAME:  Alison Russo, MRN:  [id number], DOB:  [birth year], LOS: 0 ADMISSION DATE:  02/12/2021, CONSULTATION DATE: 02/13/2021 REFERRING MD: Levora Dredge MD , CHIEF COMPLAINT: Right Leg Pain  History of present illness   87 y.o female with significant PMH as below presenting to the ED from [facility] with chief complaints of right leg pain.  Patient was seen in the clinic on 02/12/2021 for right thigh and calf pain that started 2 days prior. She also reported that her right knee was warm to touch and has been having difficulty walking since her right hip surgery on 03/26/2020. In the clinic, she was noted to have right calf tenderness and swelling. She was sent to the ED for further evaluation of her right leg pain.  On arrival to the ED, she was afebrile with blood pressure 157/101 mm Hg and pulse rate 80 beats/min. She was noted with ischemic changes to her right lower extremity with absent/non palpable PT and DP. Inial labs revealed platelets of 126 otherwise unremarkable CBC and CMP. US Venous of right leg was negative for acute right lower extremity DVT. Findings was concerning for occlusion/thrombus within the right popliteal artery. Follow up CT angiogram demonstrated thrombus within the right common femoral as well as distal SFA and tibioperoneal trunk. Vascular surgery on call was consulted and patient taken to OR emergently for angioplasty. PCCM was consulted post procedure to assist with management pos-operatively.   Past Medical History  Atrial Fibrillation HTN OSA on CPAP Recurrent Pulmonary Embolism IDA Heart Murmur NSTEMI CAD  Significant Hospital Events   5/26: Presented to the ED with ischemic right leg 5/27: Taken to OR s/p Angioplasty/Thrombectomy. Admitted to stepdown post -op  Consults:  Vascular  Procedures:  5/27: Angioplasty/ thrombectomy for Ischemia of the right lower extremity; thromboembolism to the right common femoral superficial femoral popliteal  and tibioperoneal trunk  Significant Diagnostic Tests:  5/26: US Venous of Right lower Extremity> Negative for acute right lower extremity DVT. 2. Findings concerning for occlusion/thrombus within the right popliteal artery. 5/26:CT Angio AO+ BIFEM>demonstrated thrombus within the right common femoral as well as distal SFA and tibioperoneal trunk  Micro Data:  5/26: SARS-CoV-2 PCR>> negative 5/26: Influenza PCR>> negative  Antimicrobials:  None  OBJECTIVE  Blood pressure (!) 162/118, pulse 70, temperature 97.8 F (36.6 C), temperature source Oral, resp. rate 16, height 5\' 6"  (1.676 m), weight 76.2 kg, SpO2 95 %.       No intake or output data in the 24 hours ending 02/13/21 0040 Filed Weights   02/12/21 1613  Weight: 76.2 kg     Physical Examination  GENERAL:87 year-old patient lying in the bed with no acute distress.  EYES: Pupils equal, round, reactive to light and accommodation. No scleral icterus. Extraocular muscles intact.  HEENT: Head atraumatic, normocephalic. Oropharynx and nasopharynx clear.  NECK:  Supple, no jugular venous distention. No thyroid enlargement, no tenderness.  LUNGS: Normal breath sounds bilaterally, no wheezing, rales,rhonchi or crepitation. No use of accessory muscles of respiration.  CARDIOVASCULAR: S1, S2 normal. Murmurs present, rubs, or gallops.  ABDOMEN: Soft, nontender, nondistended. Bowel sounds present. No organomegaly or mass.  EXTREMITIES: No pedal edema, cyanosis, or clubbing on the left. Right  pedal pulses +1    NEUROLOGIC: Cranial nerves II through XII are intact.  Muscle strength 5/5 in all extremities except not tested on the RLE. Sensation intact. Gait not checked.  PSYCHIATRIC: The patient is alert and oriented x 3.  SKIN: No obvious rash,  lesion, or ulcer.   Labs/imaging that I havepersonally reviewed  (right click and "Reselect all SmartList Selections" daily)    Labs   CBC: Recent Labs  Lab 02/12/21 1643  WBC 7.2   NEUTROABS 4.7  HGB 13.6  HCT 40.9  MCV 92.1  PLT 126*    Basic Metabolic Panel: Recent Labs  Lab 02/12/21 1643  NA 138  K 4.3  CL 106  CO2 25  GLUCOSE 87  BUN 15  CREATININE 0.60  CALCIUM 9.2   GFR: Estimated Creatinine Clearance: 45.8 mL/min (by C-G formula based on SCr of 0.6 mg/dL). Recent Labs  Lab 02/12/21 1643  WBC 7.2    Liver Function Tests: Recent Labs  Lab 02/12/21 1643  AST 36  ALT 23  ALKPHOS 75  BILITOT 1.2  PROT 7.2  ALBUMIN 3.6   No results for input(s): LIPASE, AMYLASE in the last 168 hours. No results for input(s): AMMONIA in the last 168 hours.  ABG    Component Value Date/Time   PHART 7.30 (L) 03/28/2020 0500   PCO2ART 41 03/28/2020 0500   PO2ART 105 03/28/2020 0500   HCO3 20.2 03/28/2020 0500   TCO2 22 03/28/2020 1249   ACIDBASEDEF 5.9 (H) 03/28/2020 0500   O2SAT 97.4 03/28/2020 0500     Coagulation Profile: No results for input(s): INR, PROTIME in the last 168 hours.  Cardiac Enzymes: No results for input(s): CKTOTAL, CKMB, CKMBINDEX, TROPONINI in the last 168 hours.  HbA1C: Hgb A1c MFr Bld  Date/Time Value Ref Range Status  03/28/2020 12:40 AM 5.6 4.8 - 5.6 % Final    Comment:    (NOTE) Pre diabetes:          5.7%-6.4%  Diabetes:              >6.4%  Glycemic control for   <7.0% adults with diabetes     CBG: No results for input(s): GLUCAP in the last 168 hours.  Review of Systems:   Review of Systems  Constitutional: Negative.   HENT: Positive for hearing loss.   Eyes: Negative.   Respiratory: Negative for cough, hemoptysis, sputum production and shortness of breath.   Cardiovascular: Positive for leg swelling. Negative for chest pain, palpitations, orthopnea and claudication.  Gastrointestinal: Negative.   Genitourinary: Negative.   Musculoskeletal: Positive for back pain, falls and joint pain. Negative for myalgias and neck pain.  Skin: Negative.   Neurological: Negative.   Endo/Heme/Allergies:  Negative.   Psychiatric/Behavioral: Positive for depression.    Past Medical History  She,  has a past medical history of Anginal pain (HCC), CAD (coronary artery disease), Heart murmur, Hypertension, Mood disorder (HCC), NSTEMI (non-ST elevated myocardial infarction) (HCC) (12/22/2017), OSA on CPAP, and PE (pulmonary embolism).   Surgical History    Past Surgical History:  Procedure Laterality Date  . BREAST CYST ASPIRATION Right    neg  . CARDIAC CATHETERIZATION    . CATARACT EXTRACTION, BILATERAL    . COLONOSCOPY    . CORONARY STENT INTERVENTION N/A 12/23/2017   Procedure: CORONARY STENT INTERVENTION;  Surgeon: Swaziland, Peter M, MD;  Location[facility] INVASIVE CV LAB;  Service: Cardiovascular;  Laterality: N/A;  . EYE SURGERY Left    "blood vessel busted behind my eye"  . EYE SURGERY Right    ?vitrectomy  . HYSTEROSCOPY WITH D & C N/A 10/25/2016   Procedure: DILATATION AND CURETTAGE /HYSTEROSCOPY, POLYPECTOMY;  Surgeon: Christeen Douglas, MD;  Location: [facility] ORS;  Service: Gynecology;  Laterality: N/A;  . INGUINAL  HERNIA REPAIR Right 1950s  . INTRAMEDULLARY (IM) NAIL INTERTROCHANTERIC Right 03/27/2020   Procedure: INTRAMEDULLARY (IM) NAIL INTERTROCHANTRIC;  Surgeon: Juanell Fairly, MD;  Location: [facility] ORS;  Service: Orthopedics;  Laterality: Right;  . LEFT HEART CATH AND CORONARY ANGIOGRAPHY N/A 12/21/2017   Procedure: LEFT HEART CATH AND CORONARY ANGIOGRAPHY possible PCI and stent;  Surgeon: Alwyn Pea, MD;  Location: [facility] INVASIVE CV LAB;  Service: Cardiovascular;  Laterality: N/A;  . LOWER EXTREMITY ANGIOGRAPHY Right 01/11/2018   Procedure: LOWER EXTREMITY ANGIOGRAPHY;  Surgeon: Annice Needy, MD;  Location: [facility] INVASIVE CV LAB;  Service: Cardiovascular;  Laterality: Right;  . PSEUDOANERYSM COMPRESSION Left 01/20/2018   Procedure: PSEUDOANERYSM COMPRESSION;  Surgeon: Renford Dills, MD;  Location: [facility] INVASIVE CV LAB;  Service: Cardiovascular;  Laterality: Left;  . TONSILLECTOMY        Social History   reports that she has never smoked. She has never used smokeless tobacco. She reports that she does not drink alcohol and does not use drugs.   Family History   Her family history includes AAA (abdominal aortic aneurysm) in her father; Breast cancer (age of onset: 68) in her mother; Coronary artery disease in her brother.   Allergies No Known Allergies   Home Medications  Prior to Admission medications   Medication Sig Start Date End Date Taking? Authorizing Provider  acetaminophen (TYLENOL) 325 MG tablet Take 2 tablets (650 mg total) by mouth every 6 (six) hours as needed for mild pain (or Fever >/= 101). 01/23/18   Enid Baas, MD  apixaban (ELIQUIS) 5 MG TABS tablet Take 1 tablet (5 mg total) by mouth 2 (two) times daily. 04/01/20   Enedina Finner, MD  atorvastatin (LIPITOR) 80 MG tablet TAKE 1 TABLET (80 MG TOTAL) BY MOUTH DAILY AT 6 PM. 03/03/18   Lyndon Code, MD  cephALEXin (KEFLEX) 250 MG capsule Take 1 capsule (250 mg total) by mouth 3 (three) times daily. 05/24/20   Irean Hong, MD  docusate sodium (COLACE) 100 MG capsule Take 1 capsule (100 mg total) by mouth 2 (two) times daily as needed for mild constipation or moderate constipation. 04/01/20   Enedina Finner, MD  feeding supplement, ENSURE ENLIVE, (ENSURE ENLIVE) LIQD Take 237 mLs by mouth 2 (two) times daily between meals. 01/24/18   Enid Baas, MD  metoprolol tartrate (LOPRESSOR) 25 MG tablet Take 1 tablet (25 mg total) by mouth 2 (two) times daily. 04/01/20   Enedina Finner, MD  Multiple Vitamins-Minerals (PRESERVISION AREDS 2+MULTI VIT PO) Take 1 tablet by mouth 2 (two) times daily.    [provider]  Naphazoline-Pheniramine (OPCON-A OP) Place 1-2 drops into both eyes 3 (three) times daily as needed (for itchy/irritated eyes.).    [provider]  pantoprazole (PROTONIX) 40 MG tablet TAKE 1 TABLET BY MOUTH EVERY DAY Patient not taking: Reported on 03/26/2020 02/06/18   Carlean Jews,  NP  polyethylene glycol (MIRALAX / GLYCOLAX) packet Take 17 g by mouth daily. Hold if >2 bowel movements /day 01/23/18   Enid Baas, MD  sertraline (ZOLOFT) 100 MG tablet Take 1 tablet (100 mg total) by mouth daily. 03/20/18   Carlean Jews, NP  traMADol (ULTRAM) 50 MG tablet Take 1 tablet (50 mg total) by mouth every 8 (eight) hours as needed for moderate pain or severe pain. 04/02/20   Enedina Finner, MD      Active Hospital Problem list   Ischemia of the right lower extremity Atrial Fibrillation HTN CAD OSA on  CPAP  Assessment & Plan:   Ischemia of the right lower extremity CT angio demonstrated thromboembolism to the right common femoral superficial femoral popliteal and tibioperoneal trunk. S/p angioplasty and stent placement right superficial femoral and popliteal arteries, angioplasty right anterior tibial and mechanical thrombectomy of the right common femoral profunda femoris superficial femoral popliteal and tibioperoneal POD # 0 - Continue Heparin gtt per nomogram, pharmacy to dose - Neurovascular checks per protocol - Recs per Vascular - Vascular following  Paroxysmal Atrial Fibrillation - rate controlled - Continue Metoprolol for rate control Thromboembolism Risk Management CHA2DS2-VASc = 7 Was on home Eliquis 5mg  BID (pt reports not taking since July 2021? Unclear reasons) -Continue Heparin gtt as above  Coronary Artery Disease  PMHx: NSTEMI + Last catheterization ? + S/P drug eluting coronary stent placement  - HTN, HLD,  control as below  HLD  + Goal LDL<100 - Atorvastatin 80mg  PO qhs  HTN  + Goal BP <130/80 -Continue Metoprolol as above  OSA on CPAP at bedtime   Best practice:  Diet:  Oral Pain/Anxiety/Delirium protocol (if indicated): No VAP protocol (if indicated): Not indicated DVT prophylaxis: Systemic AC GI prophylaxis: PPI Glucose control:  SSI No Central venous access:  N/A Arterial line:  N/A Foley:  N/A Mobility:  bed rest  PT  consulted: N/A Last date of multidisciplinary goals of care discussion [5/27] Code Status:  full code Disposition: Stepdown  Critical care time: 6440     Webb SilversmithElizabeth Tushar Enns, DNP, CCRN, FNP-C, AGACNP-BC Acute Care Nurse Practitioner  [facility] Pager: [phone]  [facility]  .

## 2021-02-13 NOTE — Progress Notes (Signed)
[facility] PROGRESS NOTE   Alison Russo YDX:[id number] DOB: [birth year] DOA: 02/12/2021  PCP: Gracelyn Nurse, MD  Brief History/Interval Summary: 87 y.o female with significant PMH including atrial fibrillation no longer on anticoagulation, essential hypertension, obstructive sleep apnea, History of recurrent pulmonary embolism, History of coronary artery disease who presented to the emergency department with complaints of pain in the right leg.  In the ED she was noted to have ischemic changes to her right lower extremity.  Vascular surgery was consulted and the patient was emergently taken for angiogram and underwent thrombectomy and angioplasty.  She was subsequently admitted to the ICU.  Seen initially by the intensivist and then transferred to hospitalist service.   Reason for Visit: Acute ischemic right lower extremity  Consultants: Vascular surgery  Procedures:    5/27 Procedure(s) Performed: 1. Introduction catheter into right lower extremity 3rd order catheter placement  2. Contrast injection right lower extremity for distal runoff  3. Percutaneous transluminal angioplasty and stent placement right superficial femoral and popliteal arteries with Viabahn stents postdilated to 6 mm 4. Percutaneous transluminal angioplasty right anterior tibial to 3 mm  5. Mechanical thrombectomy of the right common femoral profunda femoris superficial femoral popliteal and tibioperoneal trunk with the penumbra CAT 7.             6.  Star close closure left common femoral arteriotomy  Antibiotics: Anti-infectives (From admission, onward)   Start     Dose/Rate Route Frequency Ordered Stop   02/13/21 0600  ceFAZolin (ANCEF) IVPB 2g/100 mL premix  Status:  Discontinued        2 g 200 mL/hr over 30 Minutes Intravenous On call to O.R. 02/12/21 2134 02/13/21 0024      Subjective/Interval History: Patient has been  having some oozing of blood from her angiogram site on the left lower extremity.  Denies any pain in the right leg.  Complains of pain in the back from having to lay on her back for so long.  Patient's daughter is at the bedside.  Patient denies any chest pain or shortness of breath.     Assessment/Plan:  Acute ischemia right lower extremity Patient underwent CT angiogram which showed evidence for thromboembolism to the blood supply of the right lower extremity.  Patient was seen by vascular surgery and underwent emergent angioplasty and thrombectomy. Patient was placed on heparin infusion.  Some slow oozing of blood noted overnight from the angiogram site in the left leg from where they entered the vasculature.  Seen by vascular surgery earlier this morning and more pressure was applied.  Defer management to vascular surgery. Long-term anticoagulation was discussed with patient.  There is concern for embolization.  We will proceed with echocardiogram.  She would like to discuss getting back on anticoagulation with her cardiologist as well.  History of chronic atrial fibrillation Noted to be in atrial fibrillation currently.  Continue metoprolol.  Rate is adequately controlled.  She was on Eliquis up until July 2021 when it was discontinued after discussions with cardiology and primary care provider.  This was because she could not "tolerate it".  Apparently developed some "hematomas".  Sustained a lower extremity fracture and developed bleeding as result of being on anticoagulation. However considering this new event and history of recurrent pulmonary embolism in the past the risk of future thromboembolism is very high.  Patient understands the daughter understands and they would like to discuss with cardiology before agreeing to be placed on long-term anticoagulation.  Coronary  artery disease Stable.  Denies any chest pain currently.  Continue beta-blocker.  Noted to be on statin at  home.  Hyperlipidemia Noted to be on statin at home.  Essential hypertension Slightly elevated blood pressure could be due to pain issues and anxiety.  Continue to monitor.  History of obstructive sleep apnea CPAP at nighttime.    DVT Prophylaxis: On IV heparin Code Status: Full code Family Communication: Discussed with patient and her daughter Disposition Plan: To be determined  Status is: Inpatient  Remains inpatient appropriate because:IV treatments appropriate due to intensity of illness or inability to take PO and Inpatient level of care appropriate due to severity of illness   Dispo: The patient is from: Home              Anticipated d/c is to: Home              Patient currently is not medically stable to d/c.   Difficult to place patient No      Medications:  Scheduled: . Chlorhexidine Gluconate Cloth  6 each Topical Q0600  . metoprolol tartrate  25 mg Oral BID  . mupirocin ointment  1 application Nasal BID  . pantoprazole (PROTONIX) IV  40 mg Intravenous QHS  . polyethylene glycol  17 g Oral Daily  . sertraline  100 mg Oral Daily  . sodium chloride flush  3 mL Intravenous Q12H   Continuous: . sodium chloride    . heparin 1,250 Units/hr (02/13/21 1030)   NWG:NFAOZH chloride, acetaminophen, docusate sodium, morphine injection, ondansetron (ZOFRAN) IV, oxyCODONE, sodium chloride flush, traMADol   Objective:  Vital Signs  Vitals:   02/13/21 0800 02/13/21 0900 02/13/21 1000 02/13/21 1100  BP: 129/90 126/79 134/78 (!) 132/98  Pulse: 70 75 73 80  Resp: (!) 24 (!) 21 18 17   Temp:      TempSrc:      SpO2: 96% 96% 97% 97%  Weight:      Height:        Intake/Output Summary (Last 24 hours) at 02/13/2021 1153 Last data filed at 02/13/2021 0700 Gross per 24 hour  Intake 445.84 ml  Output 125 ml  Net 320.84 ml   Filed Weights   02/12/21 1613 02/13/21 0103  Weight: 76.2 kg 78 kg    General appearance: Awake alert.  In no distress Resp: Clear to  auscultation bilaterally.  Normal effort Cardio: S1-S2 is normal regular.  No S3-S4.  No rubs murmurs or bruit GI: Abdomen is soft.  Nontender nondistended.  Bowel sounds are present normal.  No masses organomegaly Extremities: No edema.   Neurologic: Alert and oriented x3.  No focal neurological deficits.    Lab Results:  Data Reviewed: I have personally reviewed following labs and imaging studies  CBC: Recent Labs  Lab 02/12/21 1643 02/13/21 0953  WBC 7.2 9.0  NEUTROABS 4.7 7.2  HGB 13.6 12.6  HCT 40.9 37.2  MCV 92.1 91.6  PLT 126* 129*    Basic Metabolic Panel: Recent Labs  Lab 02/12/21 1643 02/13/21 0953  NA 138 137  K 4.3 4.4  CL 106 105  CO2 25 25  GLUCOSE 87 109*  BUN 15 12  CREATININE 0.60 0.76  CALCIUM 9.2 8.4*    GFR: Estimated Creatinine Clearance: 46.3 mL/min (by C-G formula based on SCr of 0.76 mg/dL).  Liver Function Tests: Recent Labs  Lab 02/12/21 1643  AST 36  ALT 23  ALKPHOS 75  BILITOT 1.2  PROT 7.2  ALBUMIN 3.6  CBG: Recent Labs  Lab 02/13/21 0038  GLUCAP 98      Recent Results (from the past 240 hour(s))  MRSA PCR Screening     Status: Abnormal   Collection Time: 02/12/21  3:30 PM   Specimen: Nasal Mucosa; Nasopharyngeal  Result Value Ref Range Status   MRSA by PCR POSITIVE (A) NEGATIVE Final    Comment:        The GeneXpert MRSA Assay (FDA approved for NASAL specimens only), is one component of a comprehensive MRSA colonization surveillance program. It is not intended to diagnose MRSA infection nor to guide or monitor treatment for MRSA infections. RESULT CALLED TO, READ BACK BY AND VERIFIED WITH: Vanetta MuldersFRANCESCA Dodgen RN [phone] 02/13/21 HNM Performed at [facility], [address] [place], [place] [address]   Resp Panel by RT-PCR (Flu A&B, Covid) Nasopharyngeal Swab     Status: None   Collection Time: 02/12/21  8:50 PM   Specimen: Nasopharyngeal Swab; Nasopharyngeal(NP) swabs in vial transport medium   Result Value Ref Range Status   SARS Coronavirus 2 by RT PCR NEGATIVE NEGATIVE Final    Comment: (NOTE) SARS-CoV-2 target nucleic acids are NOT DETECTED.  The SARS-CoV-2 RNA is generally detectable in upper respiratory specimens during the acute phase of infection. The lowest concentration of SARS-CoV-2 viral copies this assay can detect is 138 copies/mL. A negative result does not preclude SARS-Cov-2 infection and should not be used as the sole basis for treatment or other patient management decisions. A negative result may occur with  improper specimen collection/handling, submission of specimen other than nasopharyngeal swab, presence of viral mutation(s) within the areas targeted by this assay, and inadequate number of viral copies(<138 copies/mL). A negative result must be combined with clinical observations, patient history, and epidemiological information. The expected result is Negative.  Fact Sheet for Patients:  BloggerCourse.comhttps://www.fda.gov/media/152166/download  Fact Sheet for Healthcare Providers:  SeriousBroker.ithttps://www.fda.gov/media/152162/download  This test is no t yet approved or cleared by the [place] FDA and  has been authorized for detection and/or diagnosis of SARS-CoV-2 by FDA under an Emergency Use Authorization (EUA). This EUA will remain  in effect (meaning this test can be used) for the duration of the COVID-19 declaration under Section 564(b)(1) of the Act, 21 U.S.C.section 360bbb-3(b)(1), unless the authorization is terminated  or revoked sooner.       Influenza A by PCR NEGATIVE NEGATIVE Final   Influenza B by PCR NEGATIVE NEGATIVE Final    Comment: (NOTE) The Xpert Xpress SARS-CoV-2/FLU/RSV plus assay is intended as an aid in the diagnosis of influenza from Nasopharyngeal swab specimens and should not be used as a sole basis for treatment. Nasal washings and aspirates are unacceptable for Xpert Xpress SARS-CoV-2/FLU/RSV testing.  Fact Sheet for  Patients: BloggerCourse.comhttps://www.fda.gov/media/152166/download  Fact Sheet for Healthcare Providers: SeriousBroker.ithttps://www.fda.gov/media/152162/download  This test is not yet approved or cleared by the [place] FDA and has been authorized for detection and/or diagnosis of SARS-CoV-2 by FDA under an Emergency Use Authorization (EUA). This EUA will remain in effect (meaning this test can be used) for the duration of the COVID-19 declaration under Section 564(b)(1) of the Act, 21 U.S.C. section 360bbb-3(b)(1), unless the authorization is terminated or revoked.  Performed at [facility], [address] [place], [place] [address]       Radiology Studies: CT Angio Aortobifemoral W and/or Wo Contrast  Result Date: 02/12/2021 CLINICAL DATA:  Right leg pain and cramping EXAM: CT ANGIOGRAPHY OF ABDOMINAL AORTA WITH ILIOFEMORAL RUNOFF TECHNIQUE: Multidetector  CT imaging of the abdomen, pelvis and lower extremities was performed using the standard protocol during bolus administration of intravenous contrast. Multiplanar CT image reconstructions and MIPs were obtained to evaluate the vascular anatomy. CONTRAST:  OMNIPAQUE IOHEXOL 350 MG/ML SOLN COMPARISON:  None. FINDINGS: VASCULAR Aorta: The visualized distal thoracic aorta is of normal caliber. The abdominal aorta is of normal caliber. No evidence of aneurysm or dissection. Extensive atherosclerotic calcification without evidence of hemodynamically significant stenosis. No significant ulcerated or soft plaque identified. No periaortic inflammatory change. 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 Distally widely patent without evidence of aneurysm or dissection. Normal anatomic configuration. SMA: Mild atherosclerotic calcification within the proximal segment. Widely patent. No aneurysm or dissection. Renals: Less than 50% stenosis of the right renal artery at its origin. Single renal arteries bilaterally. Normal vascular  morphology. No aneurysm. IMA: Less than 50% stenosis at its origin.  Distally widely patent. RIGHT Lower Extremity Inflow: Extensive atherosclerotic calcification. No significant soft or ulcerated plaque. No hemodynamically significant stenosis. Internal iliac artery is patent at its origin. No aneurysm or dissection. Outflow: There is a a branching intraluminal filling defect within the right common femoral artery extending into the proximal profundus and superficial femoral artery in keeping with an arterial embolus. This results in segmental near occlusion of the common femoral artery. The profundus femoral artery, distally, is abruptly occluded beyond the a second muscular branch related to distal embolization and is subsequently reconstituted. The superficial femoral artery is segmentally occluded at the level of the adductor hiatus secondary to distal embolization with the embolus extending through the P1 segment of the popliteal artery. Runoff: There is, again, distal embolization with occlusion of the P3 segment of the a popliteal artery with subsequent reconstitution of the a three-vessel runoff. The anterior tibial artery terminates at the level of the distal tibial diaphysis. The posterior tibial artery demonstrates occlusion at the level of the tibial plafond and. The peroneal artery terminates at the level of the distal tibial diaphysis LEFT Lower Extremity Inflow: Extensive atherosclerotic calcification. No significant soft or ulcerated plaque. No aneurysm or dissection. No evidence of hemodynamically significant stenosis. Internal iliac arteries patent proximally. Outflow: Scattered atherosclerotic plaque throughout the superficial femoral artery without hemodynamically significant stenosis. Lower extremity arterial outflow is otherwise widely patent. Runoff: Popliteal artery is widely patent. There is, however, embolic occlusion of the tibioperoneal trunk beyond the takeoff of the anterior tibial  artery in keeping with distal embolization. Similarly, there is abrupt occlusion of the anterior tibial artery proximally secondary to embolic disease. Relatively weak distal reconstitution without opacification of the dorsalis pedis artery and tibioperoneal trunk. Veins: No obvious venous abnormality within the limitations of this arterial phase study. Review of the MIP images confirms the above findings. NON-VASCULAR Lower chest: Cardiac size is mildly enlarged. Visualized lung bases are clear. Moderate hiatal hernia. Hepatobiliary: Liver contour is nodular in keeping with changes of cirrhosis. Cholelithiasis without pericholecystic inflammatory change identified. Pancreas: 18 mm cystic lesion of the identified within the mid body of the pancreas, indeterminate. This may represent a simple pancreatic cyst, side branch ectasia, pseudocyst, or a cystic neoplasm. This is slightly enlarged from prior examination of 01/19/2018 (previously measuring 16 mm) Spleen: Unremarkable Adrenals/Urinary Tract: The adrenal glands are unremarkable. Mild cortical scarring within the right kidney. The kidneys are otherwise unremarkable. The bladder demonstrates multiple small diverticula suggesting changes of a bladder outlet obstruction. The bladder is not distended. Stomach/Bowel: Severe sigmoid diverticulosis. Moderate stool throughout  the colon. The stomach, small bowel, and large bowel are otherwise unremarkable. No evidence of obstruction or focal inflammation. The appendix is not visualized and is likely absent. Lymphatic: No pathologic adenopathy within the abdomen and pelvis. Reproductive: Involuted calcified fibroid within the uterus. The pelvic organs are otherwise unremarkable. Other: Small fat containing left inguinal hernia. Musculoskeletal: Right hip ORIF has been performed utilizing a gamma nail device. Osseous structures are diffusely osteopenic. No acute bone abnormality. No lytic or blastic bone lesion. IMPRESSION:  VASCULAR Evidence of embolic disease with multiple levels of occlusion within the right lower extremity arterial outflow and runoff. On the right, embolus is noted within the common femoral artery a branching into the profundus femoral artery and superficial femoral artery proximally. Subsequent occlusion of the mid profundus femoral artery. Distal embolization with occlusion of the a terminal superficial femoral artery and P1 segment of the popliteal artery. Distal embolization with occlusion of the P3 segment of the popliteal artery. Three-vessel runoff is identified, but terminates at the level of the a distal tibia and is unclear whether this is related to poor antegrade flow or small particulate embolization. On the left, abrupt occlusion of the a anterior tibial artery proximally and tibioperoneal trunk secondary to distal embolization. Poor reconstitution of the distal vasculature with nonvisualization of the dorsalis pedis artery and plantar arch. 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 No identified embolic source within the abdominal aorta and lower extremity arterial inflow. NON-VASCULAR Mild cardiomegaly. Moderate hiatal hernia. Morphologic changes in keeping with cirrhosis. 18 mm cystic lesion within the mid body of the pancreas, indeterminate. This demonstrates slight interval growth since prior examination. Differential considerations are as listed above. Sigmoid diverticulosis. Aortic Atherosclerosis (ICD10-I70.0). These results were called by telephone at the time of interpretation on 02/12/2021 at 8:26 pm to provider [facility] , who verbally acknowledged these results. Electronically Signed   By: Helyn Numbers MD   On: 02/12/2021 20:30   PERIPHERAL VASCULAR CATHETERIZATION  Result Date: 02/13/2021 See Op Note  US Venous Img Lower Unilateral Right  Result Date: 02/12/2021 CLINICAL DATA:  Leg pain and swelling right EXAM: Right LOWER EXTREMITY VENOUS DOPPLER  ULTRASOUND TECHNIQUE: Gray-scale sonography with compression, as well as color and duplex ultrasound, were performed to evaluate the deep venous system(s) from the level of the common femoral vein through the popliteal and proximal calf veins. COMPARISON:  05/16/2020, 11/19/2019 FINDINGS: VENOUS Normal compressibility of the common femoral, superficial femoral, and popliteal veins, as well as the visualized calf veins. Slightly limited visualization of the distal femoral vein. Visualized portions of profunda femoral vein and great saphenous vein unremarkable. No filling defects to suggest DVT on grayscale or color Doppler imaging. Doppler waveforms show normal direction of venous flow, normal respiratory plasticity and response to augmentation. Limited views of the contralateral common femoral vein are unremarkable. OTHER Limited images of right popliteal artery below the knee demonstrate occlusion. Limitations: Small caliber vessels at and calcific disease of the arteries. IMPRESSION: 1. Negative for acute right lower extremity DVT 2. Findings concerning for occlusion/thrombus within the right popliteal artery. Critical Value/emergent results were called by telephone at the time of interpretation on 02/12/2021 at 5:51 pm to provider Shaune Pollack , who verbally acknowledged these results. Electronically Signed   By: Jasmine Pang M.D.   On: 02/12/2021 17:51       LOS: 0 days   Alison Russo  [facility] Pager on [email or web address]  02/13/2021, 11:53 AM

## 2021-02-13 NOTE — Progress Notes (Signed)
Patient developed a slow ooze around PAD site since last assessment. Heparin gtt placed on hold. NP made aware. Awaiting further vascular orders.

## 2021-02-13 NOTE — Progress Notes (Signed)
Received report from [place], RN and assumed patient care. At first assessment, pt still had moderate bleeding around PAD site, gauze reinforcement also had sanguinous drainage. Mild swelling around dressing ends. Pulses palpable. Tip of toes on left foot cool to touch. Rt foot slightly red and warm tips. Patient cleaned and gauze reinforcement changed for reassessment comparison. Patient denies any pain at this time.

## 2021-02-13 NOTE — Consult Note (Addendum)
ANTICOAGULATION CONSULT NOTE  Pharmacy Consult for Heparin gtt Indication: arterial occlusion (occlusion/thrombus within the right popliteal artery)  No Known Allergies  Patient Measurements: Height: 5\' 6"  (167.6 cm) Weight: 78 kg (171 lb 15.3 oz) IBW/kg (Calculated) : 59.3 Heparin Dosing Weight: 74.7kg  Vital Signs: Temp: 97.5 F (36.4 C) (05/27 1600) Temp Source: Oral (05/27 1600) BP: 106/77 (05/27 1800) Pulse Rate: 80 (05/27 1800)  Labs: Recent Labs    02/12/21 1643 02/12/21 2005 02/13/21 0953  HGB 13.6  --  12.6  HCT 40.9  --  37.2  PLT 126*  --  129*  APTT  --  28  --   HEPARINUNFRC  --  <0.10*  --   CREATININE 0.60  --  0.76    Estimated Creatinine Clearance: 46.3 mL/min (by C-G formula based on SCr of 0.76 mg/dL).    Assessment: 87yo female w/ h/o CAD, HTN, Afib (on eliquis), anemia, MDD, OA, & rt femur repair in 2021 presenting with new rt calf/leg pain a/w swelling and redness that started on 02/10/2021. CT angio showed thromboembolism to right common femoral, superficial femoral, popliteal and tibioperoneal trunk. Patient is s/p thrombectomy. Pharmacy consult for heparin drip.  Heparin drip paused at 0600 for oozing from left groin dressing, which has now improved. Heparin drip restarted at 1030. Per nurse, pt had bleeding again and was instructed by Dr. 02/12/2021 to hold heparin for 4 hours and restart with no bolus.   Goal of Therapy:  Heparin level 0.3-0.7 units/ml Monitor platelets by anticoagulation protocol: Yes   Plan:  Per Dr. Gilda Crease, nurse instructed to hold heparin again for 4 hours due to bleeding.  Will schedule heparin to restart at previous rate 1250 ml/hr and no bolus at 2200.   Will check heparin level in 8 hours   CBC daily per protocol   Gilda Crease, PharmD Pharmacy Resident  02/13/2021 7:20 PM

## 2021-02-13 NOTE — Progress Notes (Signed)
[facility]  Daily Progress Note   Subjective  - 1 Day Post-Op  Patient no longer has right foot pain.  She states her foot feels warm and she wants to have regular food.  The left groin dressing had some superficial oozing.  It has been reinjected with lidocaine with epinephrine and the safeguard replaced and has now been dry for several hours.  Heparin has been restarted  Objective Vitals:   02/13/21 0800 02/13/21 0900 02/13/21 1000 02/13/21 1100  BP: 129/90 126/79 134/78 (!) 132/98  Pulse: 70 75 73 80  Resp: (!) 24 (!) 21 18 17   Temp:      TempSrc:      SpO2: 96% 96% 97% 97%  Weight:      Height:        Intake/Output Summary (Last 24 hours) at 02/13/2021 1126 Last data filed at 02/13/2021 0700 Gross per 24 hour  Intake 445.84 ml  Output 125 ml  Net 320.84 ml    PULM  Normal effort , no use of accessory muscles CV  No JVD, RRR Abd      No distended, nontender VASC  left groin clean dry and intact safeguard in place right foot pink hyperemic and warm with a 1+ dorsalis pedis pulse  Laboratory CBC    Component Value Date/Time   WBC 9.0 02/13/2021 0953   HGB 12.6 02/13/2021 0953   HGB 11.3 09/28/2017 1259   HCT 37.2 02/13/2021 0953   HCT 36.4 09/28/2017 1259   PLT 129 (L) 02/13/2021 0953   PLT 214 09/28/2017 1259    BMET    Component Value Date/Time   NA 137 02/13/2021 0953   NA 143 09/28/2017 1259   K 4.4 02/13/2021 0953   CL 105 02/13/2021 0953   CO2 25 02/13/2021 0953   GLUCOSE 109 (H) 02/13/2021 0953   BUN 12 02/13/2021 0953   BUN 16 09/28/2017 1259   CREATININE 0.76 02/13/2021 0953   CALCIUM 8.4 (L) 02/13/2021 0953   GFRNONAA >60 02/13/2021 0953   GFRAA >60 05/23/2020 1820    Assessment/Planning: POD #1 s/p thrombectomy right lower extremity with intervention for limb salvage secondary to ischemia caused by an cardiac embolism.   She will stay in bed for another few hours we will then deflate the safeguard and once we have  confirmed her superficial oozing is no longer an issue we can get her up to a chair later this afternoon.  She has an order for advancing her diet.  She is okay for discharge from my point of view whenever cardiology and medicine have determined what anticoagulation she would be discharged on.  07/23/2020  02/13/2021, 11:26 AM

## 2021-02-13 NOTE — OR Nursing (Signed)
Report called to [facility] ICU RN. Pt recieved 1.5 mg versed, 25 mcg fentanyl 4 MG zofran, 2 G ancef. 10 MG Alteplase IA. Transfer to ICU 5.

## 2021-02-13 NOTE — Progress Notes (Signed)
0730: Dr. Lorretta Harp rounded and removed PAD. Site still have some active bleeding. MD ordered cath lab staff to inject some Lidocaine.   Pulses Pedal and PT pulses weak but palpable on both feet. R foot warm and tip of the left foot a little cool.   Daughter at bedside.  0800: Cath lab staff done the injection and put in a new PAD inflated with 40 cc of air.   MD ordered to restart heparin in a few hours if bleeding stops.   May advance diet. And may increase head of bed up to 45 degrees.   1030: Groin checked and no bleeding noted. Heparin restarted.   1730: Deflated PAD and still bleeding, reinflated back with 40 cc of air. Reinforced with gauze and pad changed got some blood on it.  Dr. Lorretta Harp notified. Ordered to hold Heparin for 4 hours then restart with no bolus. Dr. Myra Gianotti is taking over for the weekend.   1800: Heparin held to be restarted at 10 pm. Will endorse to nest shift RN.

## 2021-02-13 NOTE — Consult Note (Signed)
CARDIOLOGY CONSULT NOTE               Patient ID: Alison Russo MRN: [id number] DOB/AGE: 05/06/1926 87 y.o.  Admit date: 02/12/2021 Referring Physician Rito Ehrlich Primary Physician [facility] Primary Cardiologist [facility] Reason for Consultation anticoagulation recommendations  HPI: 87 year old female referred for evaluation of anticoagulation recommendations.  The patient has a history of paroxysmal atrial fibrillation/flutter with a history of complex hematoma of left lower leg after injury for which Eliquis was discontinued 04/2020, history of DVT and PE, coronary artery disease, status post PCI of proximal and mid LAD in 12/2017,  hypertension, and sleep apnea.  The patient presented to [facility] ER on 02/12/2021 for recent history of right lower extremity pain and swelling.  Right lower extremity ultrasound was negative for acute DVT, though findings were concerning for occlusion/thrombus within the right popliteal artery.  CTA demonstrated thrombus within the right common femoral as well as the distal SFA and tibial peroneal trunk.  Due to the limb threatening ischemia, the patient underwent angiography, which revealed subtotal occlusion of the distal common femoral fundal femoris, and proximal SFA with thrombus, and received tPA, and stent placement of the SFA and popliteal lesions. The patient was placed on heparin. The patient denies chest pain, shortness of breath, or palpitations. She has a chads vasc score of 7.  2D echocardiogram 03/2020 revealed normal left ventricular function with LVEF 50-55% with mild to moderate MR.  Review of systems complete and found to be negative unless listed above     Past Medical History:  Diagnosis Date  . Anginal pain (HCC)   . CAD (coronary artery disease)   . Heart murmur   . Hypertension   . Mood disorder (HCC)    mostly anxiety  . NSTEMI (non-ST elevated myocardial infarction) (HCC) 12/22/2017  . OSA on CPAP   . PE (pulmonary embolism)     recurrent    Past Surgical History:  Procedure Laterality Date  . BREAST CYST ASPIRATION Right    neg  . CARDIAC CATHETERIZATION    . CATARACT EXTRACTION, BILATERAL    . COLONOSCOPY    . CORONARY STENT INTERVENTION N/A 12/23/2017   Procedure: CORONARY STENT INTERVENTION;  Surgeon: Swaziland, Peter M, MD;  Location: [facility] INVASIVE CV LAB;  Service: Cardiovascular;  Laterality: N/A;  . EYE SURGERY Left    "blood vessel busted behind my eye"  . EYE SURGERY Right    ?vitrectomy  . HYSTEROSCOPY WITH D & C N/A 10/25/2016   Procedure: DILATATION AND CURETTAGE /HYSTEROSCOPY, POLYPECTOMY;  Surgeon: Christeen Douglas, MD;  Location: [facility] ORS;  Service: Gynecology;  Laterality: N/A;  . INGUINAL HERNIA REPAIR Right 1950s  . INTRAMEDULLARY (IM) NAIL INTERTROCHANTERIC Right 03/27/2020   Procedure: INTRAMEDULLARY (IM) NAIL INTERTROCHANTRIC;  Surgeon: Juanell Fairly, MD;  Location: [facility] ORS;  Service: Orthopedics;  Laterality: Right;  . LEFT HEART CATH AND CORONARY ANGIOGRAPHY N/A 12/21/2017   Procedure: LEFT HEART CATH AND CORONARY ANGIOGRAPHY possible PCI and stent;  Surgeon: Alwyn Pea, MD;  Location: [facility] INVASIVE CV LAB;  Service: Cardiovascular;  Laterality: N/A;  . LOWER EXTREMITY ANGIOGRAPHY Right 01/11/2018   Procedure: LOWER EXTREMITY ANGIOGRAPHY;  Surgeon: Annice Needy, MD;  Location: [facility] INVASIVE CV LAB;  Service: Cardiovascular;  Laterality: Right;  . LOWER EXTREMITY ANGIOGRAPHY Right 02/12/2021   Procedure: Lower Extremity Angiography;  Surgeon: Renford Dills, MD;  Location: [facility] INVASIVE CV LAB;  Service: Cardiovascular;  Laterality: Right;  . PSEUDOANERYSM COMPRESSION Left 01/20/2018   Procedure: PSEUDOANERYSM  COMPRESSION;  Surgeon: Renford Dills, MD;  Location: [facility] INVASIVE CV LAB;  Service: Cardiovascular;  Laterality: Left;  . TONSILLECTOMY      Medications Prior to Admission  Medication Sig Dispense Refill Last Dose  . acetaminophen (TYLENOL) 325 MG tablet Take 2 tablets (650 mg  total) by mouth every 6 (six) hours as needed for mild pain (or Fever >/= 101). 30 tablet 2   . apixaban (ELIQUIS) 5 MG TABS tablet Take 1 tablet (5 mg total) by mouth 2 (two) times daily. 60 tablet 1   . atorvastatin (LIPITOR) 80 MG tablet TAKE 1 TABLET (80 MG TOTAL) BY MOUTH DAILY AT 6 PM. 90 tablet 1   . cephALEXin (KEFLEX) 250 MG capsule Take 1 capsule (250 mg total) by mouth 3 (three) times daily. 21 capsule 0   . docusate sodium (COLACE) 100 MG capsule Take 1 capsule (100 mg total) by mouth 2 (two) times daily as needed for mild constipation or moderate constipation. 10 capsule 0   . feeding supplement, ENSURE ENLIVE, (ENSURE ENLIVE) LIQD Take 237 mLs by mouth 2 (two) times daily between meals. 237 mL 12   . metoprolol tartrate (LOPRESSOR) 25 MG tablet Take 1 tablet (25 mg total) by mouth 2 (two) times daily. 60 tablet 0   . Multiple Vitamins-Minerals (PRESERVISION AREDS 2+MULTI VIT PO) Take 1 tablet by mouth 2 (two) times daily.     . Naphazoline-Pheniramine (OPCON-A OP) Place 1-2 drops into both eyes 3 (three) times daily as needed (for itchy/irritated eyes.).     Marland Kitchen pantoprazole (PROTONIX) 40 MG tablet TAKE 1 TABLET BY MOUTH EVERY DAY (Patient not taking: Reported on 03/26/2020) 30 tablet 0   . polyethylene glycol (MIRALAX / GLYCOLAX) packet Take 17 g by mouth daily. Hold if >2 bowel movements /day 14 each 0   . sertraline (ZOLOFT) 100 MG tablet Take 1 tablet (100 mg total) by mouth daily. 90 tablet 0   . traMADol (ULTRAM) 50 MG tablet Take 1 tablet (50 mg total) by mouth every 8 (eight) hours as needed for moderate pain or severe pain. 15 tablet 0    Social History   Socioeconomic History  . Marital status: Widowed    Spouse name: Not on file  . Number of children: 2  . Years of education: Not on file  . Highest education level: Not on file  Occupational History  . Occupation: Office work  . Occupation: Custodian    Comment: [employer]  Tobacco Use  . Smoking status: Never  Smoker  . Smokeless tobacco: Never Used  Vaping Use  . Vaping Use: Never used  Substance and Sexual Activity  . Alcohol use: Never  . Drug use: Never  . Sexual activity: Not Currently  Other Topics Concern  . Not on file  Social History Narrative   Has living will   Daughter is health care POA   Would accept resuscitation attempts   No tube feeds if cognitively unaware   Social Determinants of Health   Financial Resource Strain: Not on file  Food Insecurity: Not on file  Transportation Needs: Not on file  Physical Activity: Not on file  Stress: Not on file  Social Connections: Not on file  Intimate Partner Violence: Not on file    Family History  Problem Relation Age of Onset  . Breast cancer Mother 100  . AAA (abdominal aortic aneurysm) Father        ruptured  . Coronary artery disease Brother  Review of systems complete and found to be negative unless listed above      PHYSICAL EXAM  General: Well developed, well nourished, lying flat in bed, in no acute distress HEENT:  Normocephalic and atramatic Neck:  No JVD.  Lungs: Clear to auscultation, normal effort of breathing on RA Heart: irregularly irregular without gallops or murmurs.  Abdomen: nondistended Neuro: Alert and oriented X 3. Psych:  Good affect, responds appropriately  Labs:   Lab Results  Component Value Date   WBC 7.2 02/12/2021   HGB 13.6 02/12/2021   HCT 40.9 02/12/2021   MCV 92.1 02/12/2021   PLT 126 (L) 02/12/2021    Recent Labs  Lab 02/12/21 1643  NA 138  K 4.3  CL 106  CO2 25  BUN 15  CREATININE 0.60  CALCIUM 9.2  PROT 7.2  BILITOT 1.2  ALKPHOS 75  ALT 23  AST 36  GLUCOSE 87   Lab Results  Component Value Date   TROPONINI <0.03 01/17/2018    Lab Results  Component Value Date   CHOL 187 12/23/2017   CHOL 218 (H) 09/28/2017   Lab Results  Component Value Date   HDL 43 12/23/2017   HDL 46 09/28/2017   Lab Results  Component Value Date   LDLCALC 117 (H)  12/23/2017   LDLCALC 144 (H) 09/28/2017   Lab Results  Component Value Date   TRIG 135 12/23/2017   TRIG 142 09/28/2017   Lab Results  Component Value Date   CHOLHDL 4.3 12/23/2017   No results found for: LDLDIRECT    Radiology: CT Angio Aortobifemoral W and/or Wo Contrast  Result Date: 02/12/2021 CLINICAL DATA:  Right leg pain and cramping EXAM: CT ANGIOGRAPHY OF ABDOMINAL AORTA WITH ILIOFEMORAL RUNOFF TECHNIQUE: Multidetector CT imaging of the abdomen, pelvis and lower extremities was performed using the standard protocol during bolus administration of intravenous contrast. Multiplanar CT image reconstructions and MIPs were obtained to evaluate the vascular anatomy. CONTRAST:  100mL OMNIPAQUE IOHEXOL 350 MG/ML SOLN COMPARISON:  None. FINDINGS: VASCULAR Aorta: The visualized distal thoracic aorta is of normal caliber. The abdominal aorta is of normal caliber. No evidence of aneurysm or dissection. Extensive atherosclerotic calcification without evidence of hemodynamically significant stenosis. No significant ulcerated or soft plaque identified. No periaortic inflammatory change. 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 Distally widely patent without evidence of aneurysm or dissection. Normal anatomic configuration. SMA: Mild atherosclerotic calcification within the proximal segment. Widely patent. No aneurysm or dissection. Renals: Less than 50% stenosis of the right renal artery at its origin. Single renal arteries bilaterally. Normal vascular morphology. No aneurysm. IMA: Less than 50% stenosis at its origin.  Distally widely patent. RIGHT Lower Extremity Inflow: Extensive atherosclerotic calcification. No significant soft or ulcerated plaque. No hemodynamically significant stenosis. Internal iliac artery is patent at its origin. No aneurysm or dissection. Outflow: There is a a branching intraluminal filling defect within the right common femoral artery extending into  the proximal profundus and superficial femoral artery in keeping with an arterial embolus. This results in segmental near occlusion of the common femoral artery. The profundus femoral artery, distally, is abruptly occluded beyond the a second muscular branch related to distal embolization and is subsequently reconstituted. The superficial femoral artery is segmentally occluded at the level of the adductor hiatus secondary to distal embolization with the embolus extending through the P1 segment of the popliteal artery. Runoff: There is, again, distal embolization with occlusion of the P3 segment of  the a popliteal artery with subsequent reconstitution of the a three-vessel runoff. The anterior tibial artery terminates at the level of the distal tibial diaphysis. The posterior tibial artery demonstrates occlusion at the level of the tibial plafond and. The peroneal artery terminates at the level of the distal tibial diaphysis LEFT Lower Extremity Inflow: Extensive atherosclerotic calcification. No significant soft or ulcerated plaque. No aneurysm or dissection. No evidence of hemodynamically significant stenosis. Internal iliac arteries patent proximally. Outflow: Scattered atherosclerotic plaque throughout the superficial femoral artery without hemodynamically significant stenosis. Lower extremity arterial outflow is otherwise widely patent. Runoff: Popliteal artery is widely patent. There is, however, embolic occlusion of the tibioperoneal trunk beyond the takeoff of the anterior tibial artery in keeping with distal embolization. Similarly, there is abrupt occlusion of the anterior tibial artery proximally secondary to embolic disease. Relatively weak distal reconstitution without opacification of the dorsalis pedis artery and tibioperoneal trunk. Veins: No obvious venous abnormality within the limitations of this arterial phase study. Review of the MIP images confirms the above findings. NON-VASCULAR Lower chest:  Cardiac size is mildly enlarged. Visualized lung bases are clear. Moderate hiatal hernia. Hepatobiliary: Liver contour is nodular in keeping with changes of cirrhosis. Cholelithiasis without pericholecystic inflammatory change identified. Pancreas: 18 mm cystic lesion of the identified within the mid body of the pancreas, indeterminate. This may represent a simple pancreatic cyst, side branch ectasia, pseudocyst, or a cystic neoplasm. This is slightly enlarged from prior examination of 01/19/2018 (previously measuring 16 mm) Spleen: Unremarkable Adrenals/Urinary Tract: The adrenal glands are unremarkable. Mild cortical scarring within the right kidney. The kidneys are otherwise unremarkable. The bladder demonstrates multiple small diverticula suggesting changes of a bladder outlet obstruction. The bladder is not distended. Stomach/Bowel: Severe sigmoid diverticulosis. Moderate stool throughout the colon. The stomach, small bowel, and large bowel are otherwise unremarkable. No evidence of obstruction or focal inflammation. The appendix is not visualized and is likely absent. Lymphatic: No pathologic adenopathy within the abdomen and pelvis. Reproductive: Involuted calcified fibroid within the uterus. The pelvic organs are otherwise unremarkable. Other: Small fat containing left inguinal hernia. Musculoskeletal: Right hip ORIF has been performed utilizing a gamma nail device. Osseous structures are diffusely osteopenic. No acute bone abnormality. No lytic or blastic bone lesion. IMPRESSION: VASCULAR Evidence of embolic disease with multiple levels of occlusion within the right lower extremity arterial outflow and runoff. On the right, embolus is noted within the common femoral artery a branching into the profundus femoral artery and superficial femoral artery proximally. Subsequent occlusion of the mid profundus femoral artery. Distal embolization with occlusion of the a terminal superficial femoral artery and P1  segment of the popliteal artery. Distal embolization with occlusion of the P3 segment of the popliteal artery. Three-vessel runoff is identified, but terminates at the level of the a distal tibia and is unclear whether this is related to poor antegrade flow or small particulate embolization. On the left, abrupt occlusion of the a anterior tibial artery proximally and tibioperoneal trunk secondary to distal embolization. Poor reconstitution of the distal vasculature with nonvisualization of the dorsalis pedis artery and plantar arch. 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 No identified embolic source within the abdominal aorta and lower extremity arterial inflow. NON-VASCULAR Mild cardiomegaly. Moderate hiatal hernia. Morphologic changes in keeping with cirrhosis. 18 mm cystic lesion within the mid body of the pancreas, indeterminate. This demonstrates slight interval growth since prior examination. Differential considerations are as listed above. Sigmoid diverticulosis. Aortic Atherosclerosis (ICD10-I70.0). These  results were called by telephone at the time of interpretation on 02/12/2021 at 8:26 pm to provider [facility] , who verbally acknowledged these results. Electronically Signed   By: Helyn Numbers MD   On: 02/12/2021 20:30   PERIPHERAL VASCULAR CATHETERIZATION  Result Date: 02/13/2021 See Op Note  US Venous Img Lower Unilateral Right  Result Date: 02/12/2021 CLINICAL DATA:  Leg pain and swelling right EXAM: Right LOWER EXTREMITY VENOUS DOPPLER ULTRASOUND TECHNIQUE: Gray-scale sonography with compression, as well as color and duplex ultrasound, were performed to evaluate the deep venous system(s) from the level of the common femoral vein through the popliteal and proximal calf veins. COMPARISON:  05/16/2020, 11/19/2019 FINDINGS: VENOUS Normal compressibility of the common femoral, superficial femoral, and popliteal veins, as well as the visualized calf veins. Slightly limited  visualization of the distal femoral vein. Visualized portions of profunda femoral vein and great saphenous vein unremarkable. No filling defects to suggest DVT on grayscale or color Doppler imaging. Doppler waveforms show normal direction of venous flow, normal respiratory plasticity and response to augmentation. Limited views of the contralateral common femoral vein are unremarkable. OTHER Limited images of right popliteal artery below the knee demonstrate occlusion. Limitations: Small caliber vessels at and calcific disease of the arteries. IMPRESSION: 1. Negative for acute right lower extremity DVT 2. Findings concerning for occlusion/thrombus within the right popliteal artery. Critical Value/emergent results were called by telephone at the time of interpretation on 02/12/2021 at 5:51 pm to provider Shaune Pollack , who verbally acknowledged these results. Electronically Signed   By: Jasmine Pang M.D.   On: 02/12/2021 17:51    EKG: atrial fibrillation/flutter, 80s bpm  ASSESSMENT AND PLAN:  1. Acute ischemic lower extremity, due to thromboembolism, status post angiography, tPA administration, and stent placement 02/12/2021. Patient placed on heparin. 2. Paroxysmal atrial fibrillation/flutter, with a chads vasc score of 7, previously on Eliquis, which was discontinued 05/2020 due to large complex hematoma due to traumatic fall.  3. Coronary artery disease, with history of NSTEMI, status post DES x 2 to proximal and mid LAD in 12/2017. On metoprolol tartrate, atorvastatin. 4. Hypertension 5. Hyperlipidemia  Recommendations: 1. Recommend resuming Eliquis for stroke/embolism prevention; discussed with patient. She is apprehensive, and is aware of the risks of not being anticoagulated. Carefully monitor for recurrent bleeding/hematoma. 2. Continue metoprolol for rate control 3. Plan to resume aspirin or Plavix with recent stent placement and history of 2 coronary stents 4. Continue statin 5. Defer  further cardiac diagnostics at this time    Signed: Leanora Ivanoff PA-C 02/13/2021, 9:26 AM

## 2021-02-13 NOTE — Progress Notes (Signed)
Patient due to have heparin gtt restarted. Contacted Dr. Myra Gianotti for clarification since patient has had active bleeding around PAD since deflation before shift change (1800). Orders to hold Heparin until 0600 and apply manual pressure received.

## 2021-02-13 NOTE — Op Note (Signed)
[facility] Percutaneous Study/Intervention Procedural Note   Date of Surgery: 02/13/2021  Surgeon: Hortencia Pilar  Pre-operative Diagnosis: Ischemia of the right lower extremity; thromboembolism to the right common femoral superficial femoral popliteal and tibioperoneal trunk  Post-operative diagnosis: Same  Procedure(s) Performed: 1. Introduction catheter into right lower extremity 3rd order catheter placement  2. Contrast injection right lower extremity for distal runoff  3. Percutaneous transluminal angioplasty and stent placement right superficial femoral and popliteal arteries with Viabahn stents postdilated to 6 mm 4. Percutaneous transluminal angioplasty right anterior tibial to 3 mm  5. Mechanical thrombectomy of the right common femoral profunda femoris superficial femoral popliteal and tibioperoneal trunk with the penumbra CAT 7.             6.  Star close closure left common femoral arteriotomy  Anesthesia: Conscious sedation was administered under my direct supervision by the interventional radiology RN. IV Versed plus fentanyl were utilized. Continuous ECG, pulse oximetry and blood pressure was monitored throughout the entire procedure.  Conscious sedation was for a total of 1 hour 42 minutes.  Sheath: 7 [place] destination left common femoral retrograde  Contrast: 40 cc  Fluoroscopy Time: 11.9 minutes  Indications: Alison Russo presents with increasing pain of the right lower extremity.  Physical examination demonstrates she has profound ischemia of her right lower extremity with motor and sensory loss.  This suggests the patient is having limb threatening ischemia. The risks and benefits for angiography with intervention are reviewed all questions answered patient agrees to proceed with the hope for limb salvage.  Procedure:Alison Russo is a 87 y.o. y.o. female who was  identified and appropriate procedural time out was performed. The patient was then placed supine on the table and prepped and draped in the usual sterile fashion.   Ultrasound was placed in the sterile sleeve and the left groin was evaluated the left common femoral artery was echolucent and pulsatile indicating patency. Image was recorded for the permanent record and under real-time visualization a microneedle was inserted into the common femoral artery followed by the microwire and then the micro-sheath. A J-wire was then advanced through the micro-sheath and a 5 [place] sheath was then inserted over a J-wire. J-wire was then advanced and a 5 French pigtail catheter was positioned at the aortic bifurcation and using a 0.035 advantage wire the bifurcation was crossed and the pigtail catheter was negotiated into the distal external iliac artery on the right.  RAO view of the femoral bifurcation was then obtained and subsequently the wire was reintroduced and 7 [place] destination sheath was advanced up and over the bifurcation and positioned with the tip in the proximal common femoral.  Diagnostic interpretation: Imaging demonstrates subtotal occlusion of the distal common femoral as well as the proximal profunda femoris and proximal SFA with thrombus.  Later in the procedure once this initial occlusion had been treated the SFA demonstrated an occlusion at Hunter's canal that extended distally into the trifurcation.  Initial imaging demonstrated reconstitution of the anterior tibial in its proximal segment as well as reconstitution of the peroneal proximally as well as the posterior tibial.  Based on these images I elected to move forward with treatment.  5 mg of tPA was then given and allowed to circulate and a penumbra CAT 7 was advanced through the sheath.  The CAT 7 was then used to treat the thrombus in both the SFA as well as the profunda femoris negotiating it between the 2 branches  with the  advantage wire.  Individually each branch was treated both with and without the separator.  Follow-up imaging now demonstrated that there was complete resolution of the thrombus with wide patency of the common femoral profunda femoris and proximal SFA.    I then advanced the catheter and the sheath down to the level of Hunter's canal.  An additional 5 mg of tPA was then instilled into the thrombus throughout the popliteal.  Once again the penumbra was utilized to perform mechanical thrombectomy using both an over-the-wire technique as well as with the separator.  The distal SFA through Hunter's canal as well as the entire length of the popliteal and the entire length of the tibioperoneal trunk was treated.  This was treated both with the over-the-wire technique as well as with the separator.  Follow-up imaging demonstrated resolution of all thrombus.  However within the distal SFA proximal popliteal there was a greater than 80% stenosis.  There is also a secondary 70% stenosis in the distal popliteal.  The tibioperoneal trunk was now widely patent without hemodynamically significant stenosis.  The anterior tibial posterior tibial and peroneal were now filling with contrast.  The anterior tibial demonstrated a proximal greater than 90% stenosis which was focal measuring approximately 15 mm in length otherwise it was patent down to the foot the peroneal appears to occlude in its distal one third the posterior tibial is patent with numerous greater than 90% stenoses beginning in its midportion and extending distally.  Based on these findings I elected to stent the SFA and popliteal lesions.  The proximal lesion was treated with a 7 mm x 100 mm Viabahn stent postdilated to 6 mm the distal popliteal lesion was treated with a 6 mm x 50 mm Viabahn stent postdilated to 6 mm.  Both inflations were to 10 atm for approximately 30 seconds.  Follow-up imaging through the sheath now demonstrated less than 10% residual  stenosis.  A vertebral catheter was then introduced and the V 18 wire negotiated into the anterior tibial.  Hand-injection of contrast was used and a magnified imaging to detail the focal lesion and a 3 mm x 40 mm balloon was advanced across the lesion inflated to 12 atm for 1 full minute.  Follow-up imaging demonstrated less than 10% residual stenosis.  Preservation of distal runoff in the anterior tibial was also noted.  After review of these images the sheath is pulled into the left external iliac oblique of the common femoral is obtained and a Star close device deployed. There no immediate Complications.  Findings:  Imaging demonstrates subtotal occlusion of the distal common femoral as well as the proximal profunda femoris and proximal SFA with thrombus.  Later in the procedure once this initial occlusion had been treated the SFA demonstrated an occlusion at Hunter's canal that extended distally into the trifurcation.  Initial imaging demonstrated reconstitution of the anterior tibial in its proximal segment as well as reconstitution of the peroneal proximally as well as the posterior tibial.  Following thrombectomy there is now complete resolution of the thrombus both in the common femoral profunda femoris superficial femoral-popliteal and tibioperoneal trunk  Following angioplasty to 3 mm the anterior tibial now is in-line flow and looks quite nice with less than 5% residual stenosis. Angioplasty and stent placement of the SFA at Hunter's canal as well as the distal popliteal yields an excellent result with less than 5% residual stenosis.  Summary: Successful recanalization right lower extremity for limb salvage   Disposition:  Patient was taken to the recovery room in stable condition having tolerated the procedure well.  Belenda Cruise Olegario Emberson 02/13/2021,12:06 AM

## 2021-02-13 NOTE — Consult Note (Signed)
ANTICOAGULATION CONSULT NOTE  Pharmacy Consult for Heparin gtt Indication: arterial occlusion (occlusion/thrombus within the right popliteal artery)  No Known Allergies  Patient Measurements: Height: 5\' 6"  (167.6 cm) Weight: 78 kg (171 lb 15.3 oz) IBW/kg (Calculated) : 59.3 Heparin Dosing Weight: 74.7kg  Vital Signs: Temp: 98.2 F (36.8 C) (05/27 0103) Temp Source: Oral (05/27 0103) BP: 132/98 (05/27 1100) Pulse Rate: 80 (05/27 1100)  Labs: Recent Labs    02/12/21 1643 02/12/21 2005 02/13/21 0953  HGB 13.6  --  12.6  HCT 40.9  --  37.2  PLT 126*  --  129*  APTT  --  28  --   HEPARINUNFRC  --  <0.10*  --   CREATININE 0.60  --  0.76    Estimated Creatinine Clearance: 46.3 mL/min (by C-G formula based on SCr of 0.76 mg/dL).    Assessment: 87yo female w/ h/o CAD, HTN, Afib (on eliquis), anemia, MDD, OA, & rt femur repair in 2021 presenting with new rt calf/leg pain a/w swelling and redness that started on 02/10/2021. CT angio showed thromboembolism to right common femoral, superficial femoral, popliteal and tibioperoneal trunk. Patient is s/p thrombectomy. Pharmacy consult for heparin drip.  Goal of Therapy:  Heparin level 0.3-0.7 units/ml aPTT 66-102 seconds Monitor platelets by anticoagulation protocol: Yes   Plan:  Heparin drip paused at 0600 for oozing from left groin dressing, which has now improved. Heparin drip restarted at 1030. Will check HL at 2000 if patient remains on heparin drip.  02/12/2021, PharmD 02/13/2021,12:08 PM

## 2021-02-13 NOTE — Progress Notes (Signed)
Patient has had increased sanguinous drainage inside of PAD. Elizabeth aware and assessed no further orders at this time.

## 2021-02-13 NOTE — Plan of Care (Signed)

## 2021-02-14 ENCOUNTER — Inpatient Hospital Stay
Admit: 2021-02-14 | Discharge: 2021-02-14 | Disposition: A | Discharge status: Home / Self Care | Payer: PPO | Attending: Internal Medicine | Admitting: Internal Medicine

## 2021-02-14 DIAGNOSIS — I998 Other disorder of circulatory system: Secondary | ICD-10-CM | POA: Diagnosis not present

## 2021-02-14 DIAGNOSIS — M79606 Pain in leg, unspecified: Secondary | ICD-10-CM | POA: Diagnosis not present

## 2021-02-14 DIAGNOSIS — I482 Chronic atrial fibrillation, unspecified: Secondary | ICD-10-CM | POA: Diagnosis not present

## 2021-02-14 DIAGNOSIS — I25110 Atherosclerotic heart disease of native coronary artery with unstable angina pectoris: Secondary | ICD-10-CM | POA: Diagnosis not present

## 2021-02-14 LAB — BASIC METABOLIC PANEL
Anion gap: 5 (ref 5–15)
BUN: 14 mg/dL (ref 8–23)
CO2: 25 mmol/L (ref 22–32)
Calcium: 8.7 mg/dL — ABNORMAL LOW (ref 8.9–10.3)
Chloride: 107 mmol/L (ref 98–111)
Creatinine, Ser: 0.76 mg/dL (ref 0.44–1.00)
GFR, Estimated: >60 mL/min (ref >60)
Glucose, Bld: 118 mg/dL — ABNORMAL HIGH (ref 70–99)
Potassium: 4.3 mmol/L (ref 3.5–5.1)
Sodium: 137 mmol/L (ref 135–145)

## 2021-02-14 LAB — CBC
HCT: 35 % — ABNORMAL LOW (ref 36.0–46.0)
Hemoglobin: 12 g/dL (ref 12.0–15.0)
MCH: 31 pg (ref 26.0–34.0)
MCHC: 34.3 g/dL (ref 30.0–36.0)
MCV: 90.4 fL (ref 80.0–100.0)
Platelets: 121 10*3/uL — ABNORMAL LOW (ref 150–400)
RBC: 3.87 MIL/uL (ref 3.87–5.11)
RDW: 13.6 % (ref 11.5–15.5)
WBC: 8.4 10*3/uL (ref 4.0–10.5)
nRBC: 0 % (ref 0.0–0.2)

## 2021-02-14 LAB — ECHOCARDIOGRAM COMPLETE
AR max vel: 1.18 cm2
AV Peak grad: 8.3 mmHg
Ao pk vel: 1.44 m/s
Area-P 1/2: 6.32 cm2
Height: 66 in
S' Lateral: 3.23 cm
Weight: 2751.34 oz

## 2021-02-14 LAB — HEPARIN LEVEL (UNFRACTIONATED): Heparin Unfractionated: <0.1 IU/mL — ABNORMAL LOW (ref 0.30–0.70)

## 2021-02-14 MED ORDER — APIXABAN 5 MG PO TABS
5.0000 mg | ORAL_TABLET | Freq: Two times a day (BID) | ORAL | Status: DC
  Administered 2021-02-14 – 2021-02-16 (×5): 5 mg via ORAL
  Filled 2021-02-14 (×5): qty 1

## 2021-02-14 MED ORDER — LIDOCAINE HCL 1 % IJ SOLN: 10.0000 mL | Freq: Once | INTRAMUSCULAR | Status: DC

## 2021-02-14 MED ORDER — ATORVASTATIN CALCIUM 20 MG PO TABS
80.0000 mg | ORAL_TABLET | Freq: Every day | ORAL | Status: DC
  Administered 2021-02-14 – 2021-02-16 (×3): 80 mg via ORAL
  Filled 2021-02-14 (×3): qty 4

## 2021-02-14 MED ORDER — ASPIRIN EC 81 MG PO TBEC
81.0000 mg | DELAYED_RELEASE_TABLET | Freq: Every day | ORAL | Status: DC
  Administered 2021-02-14 – 2021-02-16 (×3): 81 mg via ORAL
  Filled 2021-02-14 (×3): qty 1

## 2021-02-14 MED ORDER — LIDOCAINE HCL 1 % IJ SOLN
10.0000 mL | Freq: Once | INTRAMUSCULAR | Status: DC
  Filled 2021-02-14: qty 10

## 2021-02-14 NOTE — Progress Notes (Signed)
*  PRELIMINARY RESULTS* Echocardiogram 2D Echocardiogram has been performed.  Alison Russo 02/14/2021, 11:43 AM

## 2021-02-14 NOTE — Progress Notes (Signed)
[facility]  SUBJECTIVE: Patient laying in bed, denies chest pain or shortness of breath   Vitals:   02/14/21 0415 02/14/21 0430 02/14/21 0445 02/14/21 0500  BP:  123/81  122/76  Pulse: 96 (!) 113 99 (!) 108  Resp: (!) 25 (!) 23 (!) 22 (!) 25  Temp:      TempSrc:      SpO2: 96% 97% 94% 95%  Weight:      Height:         Intake/Output Summary (Last 24 hours) at 02/14/2021 5277 Last data filed at 02/14/2021 0500 Gross per 24 hour  Intake 794.78 ml  Output 850 ml  Net -55.22 ml      PHYSICAL EXAM  General: Well developed, well nourished, in no acute distress HEENT:  Normocephalic and atramatic Neck:  No JVD.  Lungs: Clear bilaterally to auscultation and percussion. Heart: HRRR . Normal S1 and S2 without gallops or murmurs.  Abdomen: Bowel sounds are positive, abdomen soft and non-tender  Msk:  Back normal, normal gait. Normal strength and tone for age. Extremities: No clubbing, cyanosis or edema.   Neuro: Alert and oriented X 3. Psych:  Good affect, responds appropriately   LABS: Basic Metabolic Panel: Recent Labs    02/13/21 0953 02/14/21 0452  NA 137 137  K 4.4 4.3  CL 105 107  CO2 25 25  GLUCOSE 109* 118*  BUN 12 14  CREATININE 0.76 0.76  CALCIUM 8.4* 8.7*   Liver Function Tests: Recent Labs    02/12/21 1643  AST 36  ALT 23  ALKPHOS 75  BILITOT 1.2  PROT 7.2  ALBUMIN 3.6   No results for input(s): LIPASE, AMYLASE in the last 72 hours. CBC: Recent Labs    02/12/21 1643 02/13/21 0953 02/14/21 0452  WBC 7.2 9.0 8.4  NEUTROABS 4.7 7.2  --   HGB 13.6 12.6 12.0  HCT 40.9 37.2 35.0*  MCV 92.1 91.6 90.4  PLT 126* 129* 121*   Cardiac Enzymes: No results for input(s): CKTOTAL, CKMB, CKMBINDEX, TROPONINI in the last 72 hours. BNP: Invalid input(s): POCBNP D-Dimer: No results for input(s): DDIMER in the last 72 hours. Hemoglobin A1C: No results for input(s): HGBA1C in the last 72 hours. Fasting Lipid Panel: No results for input(s): CHOL,  HDL, LDLCALC, TRIG, CHOLHDL, LDLDIRECT in the last 72 hours. Thyroid Function Tests: No results for input(s): TSH, T4TOTAL, T3FREE, THYROIDAB in the last 72 hours.  Invalid input(s): FREET3 Anemia Panel: No results for input(s): VITAMINB12, FOLATE, FERRITIN, TIBC, IRON, RETICCTPCT in the last 72 hours.  CT Angio Aortobifemoral W and/or Wo Contrast  Result Date: 02/12/2021 CLINICAL DATA:  Right leg pain and cramping EXAM: CT ANGIOGRAPHY OF ABDOMINAL AORTA WITH ILIOFEMORAL RUNOFF TECHNIQUE: Multidetector CT imaging of the abdomen, pelvis and lower extremities was performed using the standard protocol during bolus administration of intravenous contrast. Multiplanar CT image reconstructions and MIPs were obtained to evaluate the vascular anatomy. CONTRAST:  OMNIPAQUE IOHEXOL 350 MG/ML SOLN COMPARISON:  None. FINDINGS: VASCULAR Aorta: The visualized distal thoracic aorta is of normal caliber. The abdominal aorta is of normal caliber. No evidence of aneurysm or dissection. Extensive atherosclerotic calcification without evidence of hemodynamically significant stenosis. No significant ulcerated or soft plaque identified. No periaortic inflammatory change. 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 Distally widely patent without evidence of aneurysm or dissection. Normal anatomic configuration. SMA: Mild atherosclerotic calcification within the proximal segment. Widely patent. No aneurysm or dissection. Renals: Less than 50%  stenosis of the right renal artery at its origin. Single renal arteries bilaterally. Normal vascular morphology. No aneurysm. IMA: Less than 50% stenosis at its origin.  Distally widely patent. RIGHT Lower Extremity Inflow: Extensive atherosclerotic calcification. No significant soft or ulcerated plaque. No hemodynamically significant stenosis. Internal iliac artery is patent at its origin. No aneurysm or dissection. Outflow: There is a a branching  intraluminal filling defect within the right common femoral artery extending into the proximal profundus and superficial femoral artery in keeping with an arterial embolus. This results in segmental near occlusion of the common femoral artery. The profundus femoral artery, distally, is abruptly occluded beyond the a second muscular branch related to distal embolization and is subsequently reconstituted. The superficial femoral artery is segmentally occluded at the level of the adductor hiatus secondary to distal embolization with the embolus extending through the P1 segment of the popliteal artery. Runoff: There is, again, distal embolization with occlusion of the P3 segment of the a popliteal artery with subsequent reconstitution of the a three-vessel runoff. The anterior tibial artery terminates at the level of the distal tibial diaphysis. The posterior tibial artery demonstrates occlusion at the level of the tibial plafond and. The peroneal artery terminates at the level of the distal tibial diaphysis LEFT Lower Extremity Inflow: Extensive atherosclerotic calcification. No significant soft or ulcerated plaque. No aneurysm or dissection. No evidence of hemodynamically significant stenosis. Internal iliac arteries patent proximally. Outflow: Scattered atherosclerotic plaque throughout the superficial femoral artery without hemodynamically significant stenosis. Lower extremity arterial outflow is otherwise widely patent. Runoff: Popliteal artery is widely patent. There is, however, embolic occlusion of the tibioperoneal trunk beyond the takeoff of the anterior tibial artery in keeping with distal embolization. Similarly, there is abrupt occlusion of the anterior tibial artery proximally secondary to embolic disease. Relatively weak distal reconstitution without opacification of the dorsalis pedis artery and tibioperoneal trunk. Veins: No obvious venous abnormality within the limitations of this arterial phase study.  Review of the MIP images confirms the above findings. NON-VASCULAR Lower chest: Cardiac size is mildly enlarged. Visualized lung bases are clear. Moderate hiatal hernia. Hepatobiliary: Liver contour is nodular in keeping with changes of cirrhosis. Cholelithiasis without pericholecystic inflammatory change identified. Pancreas: 18 mm cystic lesion of the identified within the mid body of the pancreas, indeterminate. This may represent a simple pancreatic cyst, side branch ectasia, pseudocyst, or a cystic neoplasm. This is slightly enlarged from prior examination of 01/19/2018 (previously measuring 16 mm) Spleen: Unremarkable Adrenals/Urinary Tract: The adrenal glands are unremarkable. Mild cortical scarring within the right kidney. The kidneys are otherwise unremarkable. The bladder demonstrates multiple small diverticula suggesting changes of a bladder outlet obstruction. The bladder is not distended. Stomach/Bowel: Severe sigmoid diverticulosis. Moderate stool throughout the colon. The stomach, small bowel, and large bowel are otherwise unremarkable. No evidence of obstruction or focal inflammation. The appendix is not visualized and is likely absent. Lymphatic: No pathologic adenopathy within the abdomen and pelvis. Reproductive: Involuted calcified fibroid within the uterus. The pelvic organs are otherwise unremarkable. Other: Small fat containing left inguinal hernia. Musculoskeletal: Right hip ORIF has been performed utilizing a gamma nail device. Osseous structures are diffusely osteopenic. No acute bone abnormality. No lytic or blastic bone lesion. IMPRESSION: VASCULAR Evidence of embolic disease with multiple levels of occlusion within the right lower extremity arterial outflow and runoff. On the right, embolus is noted within the common femoral artery a branching into the profundus femoral artery and superficial femoral artery proximally. Subsequent occlusion of the  mid profundus femoral artery. Distal  embolization with occlusion of the a terminal superficial femoral artery and P1 segment of the popliteal artery. Distal embolization with occlusion of the P3 segment of the popliteal artery. Three-vessel runoff is identified, but terminates at the level of the a distal tibia and is unclear whether this is related to poor antegrade flow or small particulate embolization. On the left, abrupt occlusion of the a anterior tibial artery proximally and tibioperoneal trunk secondary to distal embolization. Poor reconstitution of the distal vasculature with nonvisualization of the dorsalis pedis artery and plantar arch. 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 No identified embolic source within the abdominal aorta and lower extremity arterial inflow. NON-VASCULAR Mild cardiomegaly. Moderate hiatal hernia. Morphologic changes in keeping with cirrhosis. 18 mm cystic lesion within the mid body of the pancreas, indeterminate. This demonstrates slight interval growth since prior examination. Differential considerations are as listed above. Sigmoid diverticulosis. Aortic Atherosclerosis (ICD10-I70.0). These results were called by telephone at the time of interpretation on 02/12/2021 at 8:26 pm to provider [facility] , who verbally acknowledged these results. Electronically Signed   By: Helyn NumbersAshesh  Parikh MD   On: 02/12/2021 20:30   PERIPHERAL VASCULAR CATHETERIZATION  Result Date: 02/13/2021 See Op Note  US Venous Img Lower Unilateral Right  Result Date: 02/12/2021 CLINICAL DATA:  Leg pain and swelling right EXAM: Right LOWER EXTREMITY VENOUS DOPPLER ULTRASOUND TECHNIQUE: Gray-scale sonography with compression, as well as color and duplex ultrasound, were performed to evaluate the deep venous system(s) from the level of the common femoral vein through the popliteal and proximal calf veins. COMPARISON:  05/16/2020, 11/19/2019 FINDINGS: VENOUS Normal compressibility of the common femoral, superficial  femoral, and popliteal veins, as well as the visualized calf veins. Slightly limited visualization of the distal femoral vein. Visualized portions of profunda femoral vein and great saphenous vein unremarkable. No filling defects to suggest DVT on grayscale or color Doppler imaging. Doppler waveforms show normal direction of venous flow, normal respiratory plasticity and response to augmentation. Limited views of the contralateral common femoral vein are unremarkable. OTHER Limited images of right popliteal artery below the knee demonstrate occlusion. Limitations: Small caliber vessels at and calcific disease of the arteries. IMPRESSION: 1. Negative for acute right lower extremity DVT 2. Findings concerning for occlusion/thrombus within the right popliteal artery. Critical Value/emergent results were called by telephone at the time of interpretation on 02/12/2021 at 5:51 pm to provider Shaune PollackAMERON ISAACS , who verbally acknowledged these results. Electronically Signed   By: Jasmine PangKim  Fujinaga M.D.   On: 02/12/2021 17:51     Echo   TELEMETRY: Atrial fibrillation 90 bpm:  ASSESSMENT AND PLAN:  Active Problems:   CAD (coronary artery disease)   Essential hypertension   Hyperlipidemia   History of pulmonary embolism   Atrial fibrillation (HCC)   Sleep apnea   Ischemic leg pain    1. Acute ischemic lower extremity, due to thromboembolism, status post angiography, tPA administration, and stent placement 02/12/2021. Patient placed on heparin. 2. Paroxysmal atrial fibrillation/flutter, with a chads vasc score of 7, previously on Eliquis, which was discontinued 05/2020 due to large complex hematoma due to traumatic fall.  3. Coronary artery disease, with history of NSTEMI, status post DES x 2 to proximal and mid LAD in 12/2017. On metoprolol tartrate, atorvastatin. 4. Hypertension, on metoprolol tartrate 5. Hyperlipidemia, on atorvastatin  Recommendations  1.  DC heparin 2.  Start Eliquis 5 mg twice daily 3.   Resume aspirin 81  mg daily 4.  Continue metoprolol to tartrate 5.  Resume atorvastatin 80 mg daily 6.  Defer further cardiac diagnostics at this time   Marcina Millard, MD, PhD, [facility] 02/14/2021 8:22 AM

## 2021-02-14 NOTE — Progress Notes (Signed)
Pt A&OX4. Pedal pulses 2+. PRN tylenol for pain effective. Per Dr. Myra Gianotti okay to get pt up to chair. Pt tolerated transfer to chair well. L femoral PAD site intact, appears to have a clot under PAD. Per Dr. Myra Gianotti leave in place at this time. Heparin gtt stopped and eliquis administered this shift. Will continue to monitor.

## 2021-02-14 NOTE — Progress Notes (Signed)
    Subjective  - POD #2  The patient had persistent bloody drainage from her left femoral access site last night.  Her heparin was held overnight due to the bleeding.  Her PAD pad remained in place.   Physical Exam:  PAD dressing was removed today.  There is a soft and small hematoma above the access site.  There is no further bleeding.  She has a palpable right dorsalis pedis pulse       Assessment/Plan:  POD #2  The patient was started on Eliquis this morning.  She has not had any further episodes of bleeding.  The wound appears stable.  I have replaced the PAD pad today and placed Surgicel underneath.  There was no active bleeding.  Hopefully she will not have any further issues with this.  Wells Caleyah Jr 02/14/2021 5:23 PM --  Vitals:   02/14/21 1600 02/14/21 1716  BP: 132/78 128/72  Pulse: 82 85  Resp: (!) 25 18  Temp: 97.6 F (36.4 C) 97.8 F (36.6 C)  SpO2: 97% 98%    Intake/Output Summary (Last 24 hours) at 02/14/2021 1723 Last data filed at 02/14/2021 1600 Gross per 24 hour  Intake 815.93 ml  Output 850 ml  Net -34.07 ml     Laboratory CBC    Component Value Date/Time   WBC 8.4 02/14/2021 0452   HGB 12.0 02/14/2021 0452   HGB 11.3 09/28/2017 1259   HCT 35.0 (L) 02/14/2021 0452   HCT 36.4 09/28/2017 1259   PLT 121 (L) 02/14/2021 0452   PLT 214 09/28/2017 1259    BMET    Component Value Date/Time   NA 137 02/14/2021 0452   NA 143 09/28/2017 1259   K 4.3 02/14/2021 0452   CL 107 02/14/2021 0452   CO2 25 02/14/2021 0452   GLUCOSE 118 (H) 02/14/2021 0452   BUN 14 02/14/2021 0452   BUN 16 09/28/2017 1259   CREATININE 0.76 02/14/2021 0452   CALCIUM 8.7 (L) 02/14/2021 0452   GFRNONAA >60 02/14/2021 0452   GFRAA >60 05/23/2020 1820    COAG Lab Results  Component Value Date   INR 1.6 (H) 03/27/2020   INR 1.1 03/26/2020   INR 1.29 01/20/2018   No results found for: PTT  Antibiotics Anti-infectives (From admission, onward)   Start      Dose/Rate Route Frequency Ordered Stop   02/13/21 0600  ceFAZolin (ANCEF) IVPB 2g/100 mL premix  Status:  Discontinued        2 g 200 mL/hr over 30 Minutes Intravenous On call to O.R. 02/12/21 2134 02/13/21 0024       V. Charlena Cross, M.D., [facility] [place] Office: [phone] Pager:  [phone]

## 2021-02-14 NOTE — Progress Notes (Signed)
Patient transferred to unit via bed accompanied by staff, no acute distress noted.  Patient transferred with all pertinent information and belongings. Report previously received by ICU nurse.  Assessment done.

## 2021-02-14 NOTE — Plan of Care (Signed)

## 2021-02-14 NOTE — Progress Notes (Signed)
[facility] PROGRESS NOTE   Alison Russo ZOX:[id number] DOB: [birth year] DOA: 02/12/2021  PCP: Gracelyn Nurse, MD  Brief History/Interval Summary: 87 y.o female with significant PMH including atrial fibrillation no longer on anticoagulation, essential hypertension, obstructive sleep apnea, History of recurrent pulmonary embolism, History of coronary artery disease who presented to the emergency department with complaints of pain in the right leg.  In the ED she was noted to have ischemic changes to her right lower extremity.  Vascular surgery was consulted and the patient was emergently taken for angiogram and underwent thrombectomy and angioplasty.  She was subsequently admitted to the ICU.  Seen initially by the intensivist and then transferred to hospitalist service.   Reason for Visit: Acute ischemic right lower extremity  Consultants: Vascular surgery.  Cardiology  Procedures:    5/27 Procedure(s) Performed: 1. Introduction catheter into right lower extremity 3rd order catheter placement  2. Contrast injection right lower extremity for distal runoff  3. Percutaneous transluminal angioplasty and stent placement right superficial femoral and popliteal arteries with Viabahn stents postdilated to 6 mm 4. Percutaneous transluminal angioplasty right anterior tibial to 3 mm  5. Mechanical thrombectomy of the right common femoral profunda femoris superficial femoral popliteal and tibioperoneal trunk with the penumbra CAT 7.             6.  Star close closure left common femoral arteriotomy   Transthoracic echocardiogram is pending  Antibiotics: Anti-infectives (From admission, onward)   Start     Dose/Rate Route Frequency Ordered Stop   02/13/21 0600  ceFAZolin (ANCEF) IVPB 2g/100 mL premix  Status:  Discontinued        2 g 200 mL/hr over 30 Minutes Intravenous On call to O.R. 02/12/21 2134 02/13/21 0024       Subjective/Interval History: Patient states that she is tired of laying on her back.  Not having as much back pain as yesterday but would like to get up and move around.  Has continued to have some oozing from her angiogram site in the left leg.  No other complaints offered.  Denies any chest pain or shortness of breath.       Assessment/Plan:  Acute ischemia right lower extremity Patient underwent CT angiogram which showed evidence for thromboembolism to the blood supply of the right lower extremity.  Patient was seen by vascular surgery and underwent emergent angioplasty and thrombectomy. Patient was placed on heparin infusion.   Long-term anticoagulation was discussed with patient.  There is concern for embolization.   Echocardiogram is pending.  Anticoagulation discussed with patient by self as well as by cardiology.  She seems to be agreeable.  Will defer to cardiology and vascular surgery.   She has had some oozing from her angiogram site which is being managed by vascular surgery.    History of chronic atrial fibrillation Noted to be in atrial fibrillation currently.  Continue metoprolol.  Rate is adequately controlled.  She was on Eliquis up until July 2021 when it was discontinued after discussions with cardiology and primary care provider.  This was because she could not "tolerate it".  Apparently developed some "hematomas".  Sustained a lower extremity fracture and developed bleeding as result of being on anticoagulation. However considering this new event and history of recurrent pulmonary embolism in the past the risk of future thromboembolism is very high.  Patient understands the daughter understands and they would like to discuss with cardiology before agreeing to be placed on long-term anticoagulation. Seen by  cardiology who also recommends initiating long-term anticoagulation with Eliquis.  Coronary artery disease Stable.  Denies any chest pain currently.  Continue  beta-blocker.  Noted to be on statin at home.  Hyperlipidemia Noted to be on statin at home.  Essential hypertension Stable for the most part  History of obstructive sleep apnea CPAP at nighttime.    DVT Prophylaxis: On IV heparin Code Status: Full code Family Communication: Discussed with patient and her daughter Disposition Plan: To be determined.  Will need to be mobilized once cleared to do so by vascular surgery.  Status is: Inpatient  Remains inpatient appropriate because:IV treatments appropriate due to intensity of illness or inability to take PO and Inpatient level of care appropriate due to severity of illness   Dispo: The patient is from: Home              Anticipated d/c is to: Home              Patient currently is not medically stable to d/c.   Difficult to place patient No      Medications:  Scheduled: . apixaban  5 mg Oral BID  . aspirin EC  81 mg Oral Daily  . atorvastatin  80 mg Oral Daily  . Chlorhexidine Gluconate Cloth  6 each Topical Q0600  . metoprolol tartrate  25 mg Oral BID  . mupirocin ointment  1 application Nasal BID  . pantoprazole (PROTONIX) IV  40 mg Intravenous QHS  . polyethylene glycol  17 g Oral Daily  . sertraline  100 mg Oral Daily  . sodium chloride flush  3 mL Intravenous Q12H   Continuous: . sodium chloride     KDT:OIZTIW chloride, acetaminophen, docusate sodium, morphine injection, ondansetron (ZOFRAN) IV, oxyCODONE, sodium chloride flush, traMADol   Objective:  Vital Signs  Vitals:   02/14/21 0445 02/14/21 0500 02/14/21 0800 02/14/21 1000  BP:  122/76 (!) 122/92 125/72  Pulse: 99 (!) 108 (!) 104 76  Resp: (!) 22 (!) 25 14 (!) 25  Temp:   98.9 F (37.2 C)   TempSrc:   Oral   SpO2: 94% 95% 93% 95%  Weight:      Height:        Intake/Output Summary (Last 24 hours) at 02/14/2021 1034 Last data filed at 02/14/2021 0800 Gross per 24 hour  Intake 815.93 ml  Output 1150 ml  Net -334.07 ml   Filed Weights    02/12/21 1613 02/13/21 0103  Weight: 76.2 kg 78 kg    General appearance: Awake alert.  In no distress Resp: Clear to auscultation bilaterally.  Normal effort Cardio: S1-S2 is normal regular.  No S3-S4.  No rubs murmurs or bruit GI: Abdomen is soft.  Nontender nondistended.  Bowel sounds are present normal.  No masses organomegaly Extremities: No edema noted.  Pressure dressing noted over the left groin area Neurologic: Alert and oriented x3.  No focal neurological deficits.     Lab Results:  Data Reviewed: I have personally reviewed following labs and imaging studies  CBC: Recent Labs  Lab 02/12/21 1643 02/13/21 0953 02/14/21 0452  WBC 7.2 9.0 8.4  NEUTROABS 4.7 7.2  --   HGB 13.6 12.6 12.0  HCT 40.9 37.2 35.0*  MCV 92.1 91.6 90.4  PLT 126* 129* 121*    Basic Metabolic Panel: Recent Labs  Lab 02/12/21 1643 02/13/21 0953 02/14/21 0452  NA 138 137 137  K 4.3 4.4 4.3  CL 106 105 107  CO2 25 25  25  GLUCOSE 87 109* 118*  BUN 15 12 14   CREATININE 0.60 0.76 0.76  CALCIUM 9.2 8.4* 8.7*    GFR: Estimated Creatinine Clearance: 46.3 mL/min (by C-G formula based on SCr of 0.76 mg/dL).  Liver Function Tests: Recent Labs  Lab 02/12/21 1643  AST 36  ALT 23  ALKPHOS 75  BILITOT 1.2  PROT 7.2  ALBUMIN 3.6     CBG: Recent Labs  Lab 02/13/21 0038  GLUCAP 98      Recent Results (from the past 240 hour(s))  MRSA PCR Screening     Status: Abnormal   Collection Time: 02/12/21  3:30 PM   Specimen: Nasal Mucosa; Nasopharyngeal  Result Value Ref Range Status   MRSA by PCR POSITIVE (A) NEGATIVE Final    Comment:        The GeneXpert MRSA Assay (FDA approved for NASAL specimens only), is one component of a comprehensive MRSA colonization surveillance program. It is not intended to diagnose MRSA infection nor to guide or monitor treatment for MRSA infections. RESULT CALLED TO, READ BACK BY AND VERIFIED WITH: ANJELICA GORNIAK RN [phone] 02/13/21 HNM Performed at  [facility], [address] [place], [place]   Resp Panel by RT-PCR (Flu A&B, Covid) Nasopharyngeal Swab     Status: None   Collection Time: 02/12/21  8:50 PM   Specimen: Nasopharyngeal Swab; Nasopharyngeal(NP) swabs in vial transport medium  Result Value Ref Range Status   SARS Coronavirus 2 by RT PCR NEGATIVE NEGATIVE Final    Comment: (NOTE) SARS-CoV-2 target nucleic acids are NOT DETECTED.  The SARS-CoV-2 RNA is generally detectable in upper respiratory specimens during the acute phase of infection. The lowest concentration of SARS-CoV-2 viral copies this assay can detect is 138 copies/mL. A negative result does not preclude SARS-Cov-2 infection and should not be used as the sole basis for treatment or other patient management decisions. A negative result may occur with  improper specimen collection/handling, submission of specimen other than nasopharyngeal swab, presence of viral mutation(s) within the areas targeted by this assay, and inadequate number of viral copies(<138 copies/mL). A negative result must be combined with clinical observations, patient history, and epidemiological information. The expected result is Negative.  Fact Sheet for Patients:  02/14/21  Fact Sheet for Healthcare Providers:  BloggerCourse.com  This test is no t yet approved or cleared by the [employer] FDA and  has been authorized for detection and/or diagnosis of SARS-CoV-2 by FDA under an Emergency Use Authorization (EUA). This EUA will remain  in effect (meaning this test can be used) for the duration of the COVID-19 declaration under Section 564(b)(1) of the Act, 21 U.S.C.section 360bbb-3(b)(1), unless the authorization is terminated  or revoked sooner.       Influenza A by PCR NEGATIVE NEGATIVE Final   Influenza B by PCR NEGATIVE NEGATIVE Final    Comment: (NOTE) The Xpert Xpress SARS-CoV-2/FLU/RSV plus  assay is intended as an aid in the diagnosis of influenza from Nasopharyngeal swab specimens and should not be used as a sole basis for treatment. Nasal washings and aspirates are unacceptable for Xpert Xpress SARS-CoV-2/FLU/RSV testing.  Fact Sheet for Patients: [place]  Fact Sheet for Healthcare Providers: BloggerCourse.com  This test is not yet approved or cleared by the [employer] FDA and has been authorized for detection and/or diagnosis of SARS-CoV-2 by FDA under an Emergency Use Authorization (EUA). This EUA will remain in effect (meaning this test can be used) for the duration of  the COVID-19 declaration under Section 564(b)(1) of the Act, 21 U.S.C. section 360bbb-3(b)(1), unless the authorization is terminated or revoked.  Performed at [facility], [address], [place], [place] [address]       Radiology Studies: CT Angio Aortobifemoral W and/or Wo Contrast  Result Date: 02/12/2021 CLINICAL DATA:  Right leg pain and cramping EXAM: CT ANGIOGRAPHY OF ABDOMINAL AORTA WITH ILIOFEMORAL RUNOFF TECHNIQUE: Multidetector CT imaging of the abdomen, pelvis and lower extremities was performed using the standard protocol during bolus administration of intravenous contrast. Multiplanar CT image reconstructions and MIPs were obtained to evaluate the vascular anatomy. CONTRAST:  OMNIPAQUE IOHEXOL 350 MG/ML SOLN COMPARISON:  None. FINDINGS: VASCULAR Aorta: The visualized distal thoracic aorta is of normal caliber. The abdominal aorta is of normal caliber. No evidence of aneurysm or dissection. Extensive atherosclerotic calcification without evidence of hemodynamically significant stenosis. No significant ulcerated or soft plaque identified. No periaortic inflammatory change. 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 Distally widely patent without evidence of aneurysm or dissection. Normal  anatomic configuration. SMA: Mild atherosclerotic calcification within the proximal segment. Widely patent. No aneurysm or dissection. Renals: Less than 50% stenosis of the right renal artery at its origin. Single renal arteries bilaterally. Normal vascular morphology. No aneurysm. IMA: Less than 50% stenosis at its origin.  Distally widely patent. RIGHT Lower Extremity Inflow: Extensive atherosclerotic calcification. No significant soft or ulcerated plaque. No hemodynamically significant stenosis. Internal iliac artery is patent at its origin. No aneurysm or dissection. Outflow: There is a a branching intraluminal filling defect within the right common femoral artery extending into the proximal profundus and superficial femoral artery in keeping with an arterial embolus. This results in segmental near occlusion of the common femoral artery. The profundus femoral artery, distally, is abruptly occluded beyond the a second muscular branch related to distal embolization and is subsequently reconstituted. The superficial femoral artery is segmentally occluded at the level of the adductor hiatus secondary to distal embolization with the embolus extending through the P1 segment of the popliteal artery. Runoff: There is, again, distal embolization with occlusion of the P3 segment of the a popliteal artery with subsequent reconstitution of the a three-vessel runoff. The anterior tibial artery terminates at the level of the distal tibial diaphysis. The posterior tibial artery demonstrates occlusion at the level of the tibial plafond and. The peroneal artery terminates at the level of the distal tibial diaphysis LEFT Lower Extremity Inflow: Extensive atherosclerotic calcification. No significant soft or ulcerated plaque. No aneurysm or dissection. No evidence of hemodynamically significant stenosis. Internal iliac arteries patent proximally. Outflow: Scattered atherosclerotic plaque throughout the superficial femoral artery  without hemodynamically significant stenosis. Lower extremity arterial outflow is otherwise widely patent. Runoff: Popliteal artery is widely patent. There is, however, embolic occlusion of the tibioperoneal trunk beyond the takeoff of the anterior tibial artery in keeping with distal embolization. Similarly, there is abrupt occlusion of the anterior tibial artery proximally secondary to embolic disease. Relatively weak distal reconstitution without opacification of the dorsalis pedis artery and tibioperoneal trunk. Veins: No obvious venous abnormality within the limitations of this arterial phase study. Review of the MIP images confirms the above findings. NON-VASCULAR Lower chest: Cardiac size is mildly enlarged. Visualized lung bases are clear. Moderate hiatal hernia. Hepatobiliary: Liver contour is nodular in keeping with changes of cirrhosis. Cholelithiasis without pericholecystic inflammatory change identified. Pancreas: 18 mm cystic lesion of the identified within the mid body of the pancreas, indeterminate. This may represent  a simple pancreatic cyst, side branch ectasia, pseudocyst, or a cystic neoplasm. This is slightly enlarged from prior examination of 01/19/2018 (previously measuring 16 mm) Spleen: Unremarkable Adrenals/Urinary Tract: The adrenal glands are unremarkable. Mild cortical scarring within the right kidney. The kidneys are otherwise unremarkable. The bladder demonstrates multiple small diverticula suggesting changes of a bladder outlet obstruction. The bladder is not distended. Stomach/Bowel: Severe sigmoid diverticulosis. Moderate stool throughout the colon. The stomach, small bowel, and large bowel are otherwise unremarkable. No evidence of obstruction or focal inflammation. The appendix is not visualized and is likely absent. Lymphatic: No pathologic adenopathy within the abdomen and pelvis. Reproductive: Involuted calcified fibroid within the uterus. The pelvic organs are otherwise  unremarkable. Other: Small fat containing left inguinal hernia. Musculoskeletal: Right hip ORIF has been performed utilizing a gamma nail device. Osseous structures are diffusely osteopenic. No acute bone abnormality. No lytic or blastic bone lesion. IMPRESSION: VASCULAR Evidence of embolic disease with multiple levels of occlusion within the right lower extremity arterial outflow and runoff. On the right, embolus is noted within the common femoral artery a branching into the profundus femoral artery and superficial femoral artery proximally. Subsequent occlusion of the mid profundus femoral artery. Distal embolization with occlusion of the a terminal superficial femoral artery and P1 segment of the popliteal artery. Distal embolization with occlusion of the P3 segment of the popliteal artery. Three-vessel runoff is identified, but terminates at the level of the a distal tibia and is unclear whether this is related to poor antegrade flow or small particulate embolization. On the left, abrupt occlusion of the a anterior tibial artery proximally and tibioperoneal trunk secondary to distal embolization. Poor reconstitution of the distal vasculature with nonvisualization of the dorsalis pedis artery and plantar arch. 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 No identified embolic source within the abdominal aorta and lower extremity arterial inflow. NON-VASCULAR Mild cardiomegaly. Moderate hiatal hernia. Morphologic changes in keeping with cirrhosis. 18 mm cystic lesion within the mid body of the pancreas, indeterminate. This demonstrates slight interval growth since prior examination. Differential considerations are as listed above. Sigmoid diverticulosis. Aortic Atherosclerosis (ICD10-I70.0). These results were called by telephone at the time of interpretation on 02/12/2021 at 8:26 pm to provider [facility] LPEVAN BRADLER , who verbally acknowledged these results. Electronically Signed   By: Helyn NumbersAshesh  Parikh MD    On: 02/12/2021 20:30   PERIPHERAL VASCULAR CATHETERIZATION  Result Date: 02/13/2021 See Op Note  US Venous Img Lower Unilateral Right  Result Date: 02/12/2021 CLINICAL DATA:  Leg pain and swelling right EXAM: Right LOWER EXTREMITY VENOUS DOPPLER ULTRASOUND TECHNIQUE: Gray-scale sonography with compression, as well as color and duplex ultrasound, were performed to evaluate the deep venous system(s) from the level of the common femoral vein through the popliteal and proximal calf veins. COMPARISON:  05/16/2020, 11/19/2019 FINDINGS: VENOUS Normal compressibility of the common femoral, superficial femoral, and popliteal veins, as well as the visualized calf veins. Slightly limited visualization of the distal femoral vein. Visualized portions of profunda femoral vein and great saphenous vein unremarkable. No filling defects to suggest DVT on grayscale or color Doppler imaging. Doppler waveforms show normal direction of venous flow, normal respiratory plasticity and response to augmentation. Limited views of the contralateral common femoral vein are unremarkable. OTHER Limited images of right popliteal artery below the knee demonstrate occlusion. Limitations: Small caliber vessels at and calcific disease of the arteries. IMPRESSION: 1. Negative for acute right lower extremity DVT 2. Findings concerning for occlusion/thrombus within the  right popliteal artery. Critical Value/emergent results were called by telephone at the time of interpretation on 02/12/2021 at 5:51 pm to provider Shaune Pollack , who verbally acknowledged these results. Electronically Signed   By: Jasmine Pang M.D.   On: 02/12/2021 17:51       LOS: 1 day   Alison Russo  [facility] Pager on [email or web address]  02/14/2021, 10:34 AM

## 2021-02-14 NOTE — Consult Note (Addendum)
ANTICOAGULATION CONSULT NOTE  Pharmacy Consult for Heparin gtt Indication: arterial occlusion (occlusion/thrombus within the right popliteal artery)  No Known Allergies  Patient Measurements: Height: 5\' 6"  (167.6 cm) Weight: 78 kg (171 lb 15.3 oz) IBW/kg (Calculated) : 59.3 Heparin Dosing Weight: 74.7kg  Vital Signs: Temp: 97.3 F (36.3 C) (05/28 0100) Temp Source: Oral (05/28 0100) BP: 122/76 (05/28 0500) Pulse Rate: 108 (05/28 0500)  Labs: Recent Labs    02/12/21 1643 02/12/21 2005 02/13/21 0953 02/14/21 0452  HGB 13.6  --  12.6 12.0  HCT 40.9  --  37.2 35.0*  PLT 126*  --  129* 121*  APTT  --  28  --   --   HEPARINUNFRC  --  <0.10*  --  <0.10*  CREATININE 0.60  --  0.76 0.76    Estimated Creatinine Clearance: 46.3 mL/min (by C-G formula based on SCr of 0.76 mg/dL).    Assessment: 87yo female w/ h/o CAD, HTN, Afib (on eliquis), anemia, MDD, OA, & rt femur repair in 2021 presenting with new rt calf/leg pain a/w swelling and redness that started on 02/10/2021. CT angio showed thromboembolism to right common femoral, superficial femoral, popliteal and tibioperoneal trunk. Patient is s/p thrombectomy. Pharmacy consult for heparin drip.  Heparin drip paused at 0600 for oozing from left groin dressing, which has now improved. Heparin drip restarted at 1030. Per nurse, pt had bleeding again and was instructed by Dr. 02/12/2021 to hold heparin for 4 hours and restart with no bolus.   Goal of Therapy:  Heparin level 0.3-0.7 units/ml Monitor platelets by anticoagulation protocol: Yes   Plan:  Per Dr. Gilda Crease, nurse instructed to hold heparin again for 4 hours due to bleeding.  Will schedule heparin to restart at previous rate 1250 ml/hr and no bolus at 2200.   Will check heparin level in 8 hours   CBC daily per protocol   5/28:  There was concern bleeding around PAD so heparin gtt was not restarted until 0600. Will recheck HL 8 hrs after restart on 5/29 @ 1400.    Presleigh Feldstein D, PharmD 02/14/2021 6:28 AM

## 2021-02-15 DIAGNOSIS — I25110 Atherosclerotic heart disease of native coronary artery with unstable angina pectoris: Secondary | ICD-10-CM | POA: Diagnosis not present

## 2021-02-15 DIAGNOSIS — M79606 Pain in leg, unspecified: Secondary | ICD-10-CM | POA: Diagnosis not present

## 2021-02-15 DIAGNOSIS — I998 Other disorder of circulatory system: Secondary | ICD-10-CM | POA: Diagnosis not present

## 2021-02-15 DIAGNOSIS — I482 Chronic atrial fibrillation, unspecified: Secondary | ICD-10-CM | POA: Diagnosis not present

## 2021-02-15 LAB — BASIC METABOLIC PANEL
Anion gap: 4 — ABNORMAL LOW (ref 5–15)
BUN: 15 mg/dL (ref 8–23)
CO2: 25 mmol/L (ref 22–32)
Calcium: 8.8 mg/dL — ABNORMAL LOW (ref 8.9–10.3)
Chloride: 110 mmol/L (ref 98–111)
Creatinine, Ser: 0.71 mg/dL (ref 0.44–1.00)
GFR, Estimated: >60 mL/min (ref >60)
Glucose, Bld: 105 mg/dL — ABNORMAL HIGH (ref 70–99)
Potassium: 4.1 mmol/L (ref 3.5–5.1)
Sodium: 139 mmol/L (ref 135–145)

## 2021-02-15 LAB — CBC
HCT: 34.3 % — ABNORMAL LOW (ref 36.0–46.0)
Hemoglobin: 11.4 g/dL — ABNORMAL LOW (ref 12.0–15.0)
MCH: 30.7 pg (ref 26.0–34.0)
MCHC: 33.2 g/dL (ref 30.0–36.0)
MCV: 92.5 fL (ref 80.0–100.0)
Platelets: 114 10*3/uL — ABNORMAL LOW (ref 150–400)
RBC: 3.71 MIL/uL — ABNORMAL LOW (ref 3.87–5.11)
RDW: 13.8 % (ref 11.5–15.5)
WBC: 8.7 10*3/uL (ref 4.0–10.5)
nRBC: 0 % (ref 0.0–0.2)

## 2021-02-15 NOTE — Progress Notes (Signed)
[facility] Cardiology  SUBJECTIVE: Shin laying in bed, denies chest pain or shortness of breath, PAD in place, no evidence for bleeding   Vitals:   02/14/21 1941 02/15/21 0002 02/15/21 0358 02/15/21 0802  BP: 118/79 114/73 137/76 135/88  Pulse: 79 95 90 87  Resp: 18 16 20 18   Temp: 97.8 F (36.6 C) 98.5 F (36.9 C) 98.2 F (36.8 C) 98.6 F (37 C)  TempSrc: Oral   Oral  SpO2: 98% 97% 94% 98%  Weight:      Height:         Intake/Output Summary (Last 24 hours) at 02/15/2021 0916 Last data filed at 02/15/2021 0359 Gross per 24 hour  Intake --  Output 550 ml  Net -550 ml      PHYSICAL EXAM  General: Well developed, well nourished, in no acute distress HEENT:  Normocephalic and atramatic Neck:  No JVD.  Lungs: Clear bilaterally to auscultation and percussion. Heart: HRRR . Normal S1 and S2 without gallops or murmurs.  Abdomen: Bowel sounds are positive, abdomen soft and non-tender  Msk:  Back normal, normal gait. Normal strength and tone for age. Extremities: No clubbing, cyanosis or edema.   Neuro: Alert and oriented X 3. Psych:  Good affect, responds appropriately   LABS: Basic Metabolic Panel: Recent Labs    02/14/21 0452 02/15/21 0525  NA 137 139  K 4.3 4.1  CL 107 110  CO2 25 25  GLUCOSE 118* 105*  BUN 14 15  CREATININE 0.76 0.71  CALCIUM 8.7* 8.8*   Liver Function Tests: Recent Labs    02/12/21 1643  AST 36  ALT 23  ALKPHOS 75  BILITOT 1.2  PROT 7.2  ALBUMIN 3.6   No results for input(s): LIPASE, AMYLASE in the last 72 hours. CBC: Recent Labs    02/12/21 1643 02/13/21 0953 02/14/21 0452 02/15/21 0525  WBC 7.2 9.0 8.4 8.7  NEUTROABS 4.7 7.2  --   --   HGB 13.6 12.6 12.0 11.4*  HCT 40.9 37.2 35.0* 34.3*  MCV 92.1 91.6 90.4 92.5  PLT 126* 129* 121* 114*   Cardiac Enzymes: No results for input(s): CKTOTAL, CKMB, CKMBINDEX, TROPONINI in the last 72 hours. BNP: Invalid input(s): POCBNP D-Dimer: No results for input(s): DDIMER in the last 72  hours. Hemoglobin A1C: No results for input(s): HGBA1C in the last 72 hours. Fasting Lipid Panel: No results for input(s): CHOL, HDL, LDLCALC, TRIG, CHOLHDL, LDLDIRECT in the last 72 hours. Thyroid Function Tests: No results for input(s): TSH, T4TOTAL, T3FREE, THYROIDAB in the last 72 hours.  Invalid input(s): FREET3 Anemia Panel: No results for input(s): VITAMINB12, FOLATE, FERRITIN, TIBC, IRON, RETICCTPCT in the last 72 hours.  ECHOCARDIOGRAM COMPLETE  Result Date: 02/14/2021    ECHOCARDIOGRAM REPORT   Patient Name:   Alison Russo Date of Exam: 02/14/2021 Medical Rec #:  [id number]        Height:       66.0 in Accession #:    [id number]       Weight:       172.0 lb Date of Birth:  02-08-1926        BSA:          1.875 m Patient Age:    87 years         BP:           122/76 mmHg Patient Gender: F                HR:  68 bpm. Exam Location:  [facility] Procedure: 2D Echo Indications:     Ischemia of right lower extremity  History:         Patient has prior history of Echocardiogram examinations, most                  recent 03/28/2020.  Sonographer:     Overton Mam RDCS Referring Phys:  [id number] Osvaldo Shipper Diagnosing Phys: Marcina Millard MD  Sonographer Comments: Technically difficult study due to poor echo windows. Image acquisition challenging due to respiratory motion. IMPRESSIONS  1. Left ventricular ejection fraction, by estimation, is 55 to 60%. The left ventricle has normal function. The left ventricle has no regional wall motion abnormalities. Left ventricular diastolic parameters are indeterminate.  2. Right ventricular systolic function is normal. The right ventricular size is normal.  3. The mitral valve is normal in structure. Mild mitral valve regurgitation. No evidence of mitral stenosis.  4. The aortic valve is normal in structure. Aortic valve regurgitation is not visualized. Mild to moderate aortic valve sclerosis/calcification is present, without any evidence of aortic  stenosis.  5. The inferior vena cava is normal in size with greater than 50% respiratory variability, suggesting right atrial pressure of 3 mmHg. FINDINGS  Left Ventricle: Left ventricular ejection fraction, by estimation, is 55 to 60%. The left ventricle has normal function. The left ventricle has no regional wall motion abnormalities. The left ventricular internal cavity size was normal in size. There is  no left ventricular hypertrophy. Left ventricular diastolic parameters are indeterminate. Right Ventricle: The right ventricular size is normal. No increase in right ventricular wall thickness. Right ventricular systolic function is normal. Left Atrium: Left atrial size was normal in size. Right Atrium: Right atrial size was normal in size. Pericardium: There is no evidence of pericardial effusion. Mitral Valve: The mitral valve is normal in structure. Mild mitral valve regurgitation. No evidence of mitral valve stenosis. Tricuspid Valve: The tricuspid valve is normal in structure. Tricuspid valve regurgitation is mild . No evidence of tricuspid stenosis. Aortic Valve: The aortic valve is normal in structure. Aortic valve regurgitation is not visualized. Mild to moderate aortic valve sclerosis/calcification is present, without any evidence of aortic stenosis. Aortic valve peak gradient measures 8.3 mmHg. Pulmonic Valve: The pulmonic valve was normal in structure. Pulmonic valve regurgitation is not visualized. No evidence of pulmonic stenosis. Aorta: The aortic root is normal in size and structure. Venous: The inferior vena cava is normal in size with greater than 50% respiratory variability, suggesting right atrial pressure of 3 mmHg. IAS/Shunts: No atrial level shunt detected by color flow Doppler.  LEFT VENTRICLE PLAX 2D LVIDd:         4.70 cm  Diastology LVIDs:         3.23 cm  LV e' medial:    5.22 cm/s LV PW:         1.13 cm  LV E/e' medial:  19.3 LV IVS:        1.06 cm  LV e' lateral:   7.62 cm/s LVOT  diam:     1.80 cm  LV E/e' lateral: 13.3 LV SV:         27 LV SV Index:   14 LVOT Area:     2.54 cm  RIGHT VENTRICLE RV S prime:     10.90 cm/s TAPSE (M-mode): 1.6 cm LEFT ATRIUM            Index LA diam:  6.10 cm  3.25 cm/m LA Vol (A4C): 104.0 ml 55.45 ml/m  AORTIC VALVE                PULMONIC VALVE AV Area (Vmax): 1.18 cm    PV Vmax:       0.94 m/s AV Vmax:        144.00 cm/s PV Peak grad:  3.5 mmHg AV Peak Grad:   8.3 mmHg LVOT Vmax:      66.60 cm/s LVOT Vmean:     42.600 cm/s LVOT VTI:       0.106 m  AORTA Ao Root diam: 3.20 cm Ao Asc diam:  3.10 cm MITRAL VALVE                TRICUSPID VALVE MV Area (PHT): 6.32 cm     TV Peak grad:   18.5 mmHg MV Decel Time: 120 msec     TV Vmax:        2.15 m/s MV E velocity: 101.00 cm/s                             SHUNTS                             Systemic VTI:  0.11 m                             Systemic Diam: 1.80 cm Marcina Millard MD Electronically signed by Marcina Millard MD Signature Date/Time: 02/14/2021/1:28:17 PM    Final      Echo   TELEMETRY: Atrial fibrillation 90 bpm:  ASSESSMENT AND PLAN:  Active Problems:   CAD (coronary artery disease)   Essential hypertension   Hyperlipidemia   History of pulmonary embolism   Atrial fibrillation (HCC)   Sleep apnea   Ischemic leg pain    1.  Acute ischemic lower extremity, due to thromboembolism, status post angiography, tPA administration, and stent placement 02/12/2021, postprocedural bleeding sheath insertion site, stable, without recurrent bleeding overnight 2. Paroxysmal atrial fibrillation/flutter, with a chads vasc score of 7, previously on Eliquis, which was discontinued 05/2020 due to large complex hematoma due to traumatic fall.  3. Coronary artery disease, with history of NSTEMI, status post DES x 2 to proximal and mid LAD in 12/2017. On metoprolol tartrate, atorvastatin. 4. Hypertension, on metoprolol tartrate 5. Hyperlipidemia, on atorvastatin  Recommendations  1.   Agree with current therapy 2.  Continue Eliquis 5 mg twice daily 3.  Continue aspirin 81 mg daily 4.  Continue metoprolol to tartrate 5.  Continue atorvastatin 80 mg daily 6.  Defer further cardiac diagnostics at this time 7.  Remove PAD per Vascular 8.  May discharge home from cardiovascular perspective 9.  Follow-up with Dr. Juliann Pares 1 week  Sign off for now, please call if any questions   Marcina Millard, MD, PhD, [facility] 02/15/2021 9:16 AM

## 2021-02-15 NOTE — Plan of Care (Signed)
  Problem: Education: Goal: Knowledge of General Education information will improve Description: Including pain rating scale, medication(s)/side effects and non-pharmacologic comfort measures Outcome: Progressing   Problem: Coping: Goal: Level of anxiety will decrease Outcome: Progressing   Problem: Safety: Goal: Ability to remain free from injury will improve Outcome: Progressing   

## 2021-02-15 NOTE — Progress Notes (Signed)
[facility] PROGRESS NOTE   Alison Russo EXH:[id number] DOB: [birth year] DOA: 02/12/2021  PCP: Gracelyn Nurse, MD  Brief History/Interval Summary: 87 y.o female with significant PMH including atrial fibrillation no longer on anticoagulation, essential hypertension, obstructive sleep apnea, History of recurrent pulmonary embolism, History of coronary artery disease who presented to the emergency department with complaints of pain in the right leg.  In the ED she was noted to have ischemic changes to her right lower extremity.  Vascular surgery was consulted and the patient was emergently taken for angiogram and underwent thrombectomy and angioplasty.  She was subsequently admitted to the ICU.  Seen initially by the intensivist and then transferred to hospitalist service.   Reason for Visit: Acute ischemic right lower extremity  Consultants: Vascular surgery.  Cardiology  Procedures:    5/27 Procedure(s) Performed: 1. Introduction catheter into right lower extremity 3rd order catheter placement  2. Contrast injection right lower extremity for distal runoff  3. Percutaneous transluminal angioplasty and stent placement right superficial femoral and popliteal arteries with Viabahn stents postdilated to 6 mm 4. Percutaneous transluminal angioplasty right anterior tibial to 3 mm  5. Mechanical thrombectomy of the right common femoral profunda femoris superficial femoral popliteal and tibioperoneal trunk with the penumbra CAT 7.             6.  Star close closure left common femoral arteriotomy   Transthoracic echocardiogram 1. Left ventricular ejection fraction, by estimation, is 55 to 60%. The  left ventricle has normal function. The left ventricle has no regional  wall motion abnormalities. Left ventricular diastolic parameters are  indeterminate.  2. Right ventricular systolic function is normal. The right  ventricular  size is normal.  3. The mitral valve is normal in structure. Mild mitral valve  regurgitation. No evidence of mitral stenosis.  4. The aortic valve is normal in structure. Aortic valve regurgitation is  not visualized. Mild to moderate aortic valve sclerosis/calcification is  present, without any evidence of aortic stenosis.  5. The inferior vena cava is normal in size with greater than 50%  respiratory variability, suggesting right atrial pressure of 3 mmHg.   Antibiotics: Anti-infectives (From admission, onward)   Start     Dose/Rate Route Frequency Ordered Stop   02/13/21 0600  ceFAZolin (ANCEF) IVPB 2g/100 mL premix  Status:  Discontinued        2 g 200 mL/hr over 30 Minutes Intravenous On call to O.R. 02/12/21 2134 02/13/21 0024      Subjective/Interval History: Overall patient feels well.  No further oozing noted from the angiogram site in the left leg.  Denies any complaints.  No shortness of breath or chest pain.       Assessment/Plan:  Acute ischemia right lower extremity Patient underwent CT angiogram which showed evidence for thromboembolism to the blood supply of the right lower extremity.  Patient was seen by vascular surgery and underwent emergent angioplasty and thrombectomy. There is concern for embolization.  Patient was placed on heparin infusion. Long-term anticoagulation was discussed with patient.   Echocardiogram was done and shows normal systolic function.  No significant abnormalities noted.   After further discussions with patient and her daughter by cardiology, patient was started on Eliquis yesterday.    History of chronic atrial fibrillation She was on Eliquis up until July 2021 when it was discontinued after discussions with cardiology and primary care provider.  This was because she could not "tolerate it".  Apparently developed some "hematomas".  Sustained a lower extremity fracture and developed bleeding as result of being on  anticoagulation. However considering this new event and history of recurrent pulmonary embolism in the past the risk of future thromboembolism is very high.   Cardiology was consulted and they also recommended Eliquis which was initiated yesterday.  Heart rate is controlled on metoprolol.   Slight drop in hemoglobin is noted but no evidence of bleeding.  Drop in hemoglobin could be dilutional.  We will recheck tomorrow.  Coronary artery disease Stable.  Denies any chest pain currently.  Continue beta-blocker.  Noted to be on statin at home.  Hyperlipidemia Noted to be on statin at home.  Essential hypertension Stable for the most part  History of obstructive sleep apnea CPAP at nighttime.    DVT Prophylaxis: Eliquis Code Status: Full code Family Communication: Discussed with patient Disposition Plan: Will need to be mobilized once cleared to do so by vascular surgery.  Discharge when cleared by vascular surgery.  Status is: Inpatient  Remains inpatient appropriate because:IV treatments appropriate due to intensity of illness or inability to take PO and Inpatient level of care appropriate due to severity of illness   Dispo: The patient is from: Home              Anticipated d/c is to: Home              Patient currently is not medically stable to d/c.   Difficult to place patient No      Medications:  Scheduled: . apixaban  5 mg Oral BID  . aspirin EC  81 mg Oral Daily  . atorvastatin  80 mg Oral Daily  . Chlorhexidine Gluconate Cloth  6 each Topical Q0600  . lidocaine  10 mL Intradermal Once  . metoprolol tartrate  25 mg Oral BID  . mupirocin ointment  1 application Nasal BID  . pantoprazole (PROTONIX) IV  40 mg Intravenous QHS  . polyethylene glycol  17 g Oral Daily  . sertraline  100 mg Oral Daily  . sodium chloride flush  3 mL Intravenous Q12H   Continuous: . sodium chloride     ZOX:WRUEAVPRN:sodium chloride, acetaminophen, docusate sodium, morphine injection,  ondansetron (ZOFRAN) IV, oxyCODONE, sodium chloride flush, traMADol   Objective:  Vital Signs  Vitals:   02/14/21 1941 02/15/21 0002 02/15/21 0358 02/15/21 0802  BP: 118/79 114/73 137/76 135/88  Pulse: 79 95 90 87  Resp: 18 16 20 18   Temp: 97.8 F (36.6 C) 98.5 F (36.9 C) 98.2 F (36.8 C) 98.6 F (37 C)  TempSrc: Oral   Oral  SpO2: 98% 97% 94% 98%  Weight:      Height:        Intake/Output Summary (Last 24 hours) at 02/15/2021 1053 Last data filed at 02/15/2021 1034 Gross per 24 hour  Intake 240 ml  Output 550 ml  Net -310 ml   Filed Weights   02/12/21 1613 02/13/21 0103  Weight: 76.2 kg 78 kg    General appearance: Awake alert.  In no distress Resp: Clear to auscultation bilaterally.  Normal effort Cardio: S1-S2 is normal regular.  No S3-S4.  No rubs murmurs or bruit GI: Abdomen is soft.  Nontender nondistended.  Bowel sounds are present normal.  No masses organomegaly Extremities: No significant edema noted Neurologic: Alert and oriented x3.  No focal neurological deficits.      Lab Results:  Data Reviewed: I have personally reviewed following labs and imaging studies  CBC: Recent Labs  Lab 02/12/21 1643 02/13/21 0953 02/14/21 0452 02/15/21 0525  WBC 7.2 9.0 8.4 8.7  NEUTROABS 4.7 7.2  --   --   HGB 13.6 12.6 12.0 11.4*  HCT 40.9 37.2 35.0* 34.3*  MCV 92.1 91.6 90.4 92.5  PLT 126* 129* 121* 114*    Basic Metabolic Panel: Recent Labs  Lab 02/12/21 1643 02/13/21 0953 02/14/21 0452 02/15/21 0525  NA 138 137 137 139  K 4.3 4.4 4.3 4.1  CL 106 105 107 110  CO2 GLUCOSE 87 109* 118* 105*  BUN CREATININE 0.60 0.76 0.76 0.71  CALCIUM 9.2 8.4* 8.7* 8.8*    GFR: Estimated Creatinine Clearance: 46.3 mL/min (by C-G formula based on SCr of 0.71 mg/dL).  Liver Function Tests: Recent Labs  Lab 02/12/21 1643  AST 36  ALT 23  ALKPHOS 75  BILITOT 1.2  PROT 7.2  ALBUMIN 3.6     CBG: Recent Labs  Lab  02/13/21 0038  GLUCAP 98      Recent Results (from the past 240 hour(s))  MRSA PCR Screening     Status: Abnormal   Collection Time: 02/12/21  3:30 PM   Specimen: Nasal Mucosa; Nasopharyngeal  Result Value Ref Range Status   MRSA by PCR POSITIVE (A) NEGATIVE Final    Comment:        The GeneXpert MRSA Assay (FDA approved for NASAL specimens only), is one component of a comprehensive MRSA colonization surveillance program. It is not intended to diagnose MRSA infection nor to guide or monitor treatment for MRSA infections. RESULT CALLED TO, READ BACK BY AND VERIFIED WITH: ARLY SALMINEN RN [phone] 02/13/21 HNM Performed at [facility], [address] [place], [place] [address]   Resp Panel by RT-PCR (Flu A&B, Covid) Nasopharyngeal Swab     Status: None   Collection Time: 02/12/21  8:50 PM   Specimen: Nasopharyngeal Swab; Nasopharyngeal(NP) swabs in vial transport medium  Result Value Ref Range Status   SARS Coronavirus 2 by RT PCR NEGATIVE NEGATIVE Final    Comment: (NOTE) SARS-CoV-2 target nucleic acids are NOT DETECTED.  The SARS-CoV-2 RNA is generally detectable in upper respiratory specimens during the acute phase of infection. The lowest concentration of SARS-CoV-2 viral copies this assay can detect is 138 copies/mL. A negative result does not preclude SARS-Cov-2 infection and should not be used as the sole basis for treatment or other patient management decisions. A negative result may occur with  improper specimen collection/handling, submission of specimen other than nasopharyngeal swab, presence of viral mutation(s) within the areas targeted by this assay, and inadequate number of viral copies(<138 copies/mL). A negative result must be combined with clinical observations, patient history, and epidemiological information. The expected result is Negative.  Fact Sheet for Patients:  BloggerCourse.com  Fact Sheet for Healthcare  Providers:  SeriousBroker.it  This test is no t yet approved or cleared by the [place] FDA and  has been authorized for detection and/or diagnosis of SARS-CoV-2 by FDA under an Emergency Use Authorization (EUA). This EUA will remain  in effect (meaning this test can be used) for the duration of the COVID-19 declaration under Section 564(b)(1) of the Act, 21 U.S.C.section 360bbb-3(b)(1), unless the authorization is terminated  or revoked sooner.       Influenza A by PCR NEGATIVE NEGATIVE Final   Influenza B by PCR NEGATIVE NEGATIVE Final    Comment: (NOTE) The Xpert Xpress SARS-CoV-2/FLU/RSV plus assay is intended as  an aid in the diagnosis of influenza from Nasopharyngeal swab specimens and should not be used as a sole basis for treatment. Nasal washings and aspirates are unacceptable for Xpert Xpress SARS-CoV-2/FLU/RSV testing.  Fact Sheet for Patients: BloggerCourse.com  Fact Sheet for Healthcare Providers: SeriousBroker.it  This test is not yet approved or cleared by the [place] FDA and has been authorized for detection and/or diagnosis of SARS-CoV-2 by FDA under an Emergency Use Authorization (EUA). This EUA will remain in effect (meaning this test can be used) for the duration of the COVID-19 declaration under Section 564(b)(1) of the Act, 21 U.S.C. section 360bbb-3(b)(1), unless the authorization is terminated or revoked.  Performed at [facility], [address], [place], [place] [address]       Radiology Studies: ECHOCARDIOGRAM COMPLETE  Result Date: 02/14/2021    ECHOCARDIOGRAM REPORT   Patient Name:   Alison Russo Date of Exam: 02/14/2021 Medical Rec #:  [id number]        Height:       66.0 in Accession #:    [id number]       Weight:       172.0 lb Date of Birth:  1926/02/07        BSA:          1.875 m Patient Age:    89 years         BP:           122/76 mmHg  Patient Gender: F                HR:           68 bpm. Exam Location:  [facility] Procedure: 2D Echo Indications:     Ischemia of right lower extremity  History:         Patient has prior history of Echocardiogram examinations, most                  recent 03/28/2020.  Sonographer:     Overton Mam RDCS Referring Phys:  [id number] Osvaldo Shipper Diagnosing Phys: Marcina Millard MD  Sonographer Comments: Technically difficult study due to poor echo windows. Image acquisition challenging due to respiratory motion. IMPRESSIONS  1. Left ventricular ejection fraction, by estimation, is 55 to 60%. The left ventricle has normal function. The left ventricle has no regional wall motion abnormalities. Left ventricular diastolic parameters are indeterminate.  2. Right ventricular systolic function is normal. The right ventricular size is normal.  3. The mitral valve is normal in structure. Mild mitral valve regurgitation. No evidence of mitral stenosis.  4. The aortic valve is normal in structure. Aortic valve regurgitation is not visualized. Mild to moderate aortic valve sclerosis/calcification is present, without any evidence of aortic stenosis.  5. The inferior vena cava is normal in size with greater than 50% respiratory variability, suggesting right atrial pressure of 3 mmHg. FINDINGS  Left Ventricle: Left ventricular ejection fraction, by estimation, is 55 to 60%. The left ventricle has normal function. The left ventricle has no regional wall motion abnormalities. The left ventricular internal cavity size was normal in size. There is  no left ventricular hypertrophy. Left ventricular diastolic parameters are indeterminate. Right Ventricle: The right ventricular size is normal. No increase in right ventricular wall thickness. Right ventricular systolic function is normal. Left Atrium: Left atrial size was normal in size. Right Atrium: Right atrial size was normal in size. Pericardium: There is no evidence of pericardial  effusion. Mitral Valve: The mitral valve  is normal in structure. Mild mitral valve regurgitation. No evidence of mitral valve stenosis. Tricuspid Valve: The tricuspid valve is normal in structure. Tricuspid valve regurgitation is mild . No evidence of tricuspid stenosis. Aortic Valve: The aortic valve is normal in structure. Aortic valve regurgitation is not visualized. Mild to moderate aortic valve sclerosis/calcification is present, without any evidence of aortic stenosis. Aortic valve peak gradient measures 8.3 mmHg. Pulmonic Valve: The pulmonic valve was normal in structure. Pulmonic valve regurgitation is not visualized. No evidence of pulmonic stenosis. Aorta: The aortic root is normal in size and structure. Venous: The inferior vena cava is normal in size with greater than 50% respiratory variability, suggesting right atrial pressure of 3 mmHg. IAS/Shunts: No atrial level shunt detected by color flow Doppler.  LEFT VENTRICLE PLAX 2D LVIDd:         4.70 cm  Diastology LVIDs:         3.23 cm  LV e' medial:    5.22 cm/s LV PW:         1.13 cm  LV E/e' medial:  19.3 LV IVS:        1.06 cm  LV e' lateral:   7.62 cm/s LVOT diam:     1.80 cm  LV E/e' lateral: 13.3 LV SV:         27 LV SV Index:   14 LVOT Area:     2.54 cm  RIGHT VENTRICLE RV S prime:     10.90 cm/s TAPSE (M-mode): 1.6 cm LEFT ATRIUM            Index LA diam:      6.10 cm  3.25 cm/m LA Vol (A4C): 104.0 ml 55.45 ml/m  AORTIC VALVE                PULMONIC VALVE AV Area (Vmax): 1.18 cm    PV Vmax:       0.94 m/s AV Vmax:        144.00 cm/s PV Peak grad:  3.5 mmHg AV Peak Grad:   8.3 mmHg LVOT Vmax:      66.60 cm/s LVOT Vmean:     42.600 cm/s LVOT VTI:       0.106 m  AORTA Ao Root diam: 3.20 cm Ao Asc diam:  3.10 cm MITRAL VALVE                TRICUSPID VALVE MV Area (PHT): 6.32 cm     TV Peak grad:   18.5 mmHg MV Decel Time: 120 msec     TV Vmax:        2.15 m/s MV E velocity: 101.00 cm/s                             SHUNTS                              Systemic VTI:  0.11 m                             Systemic Diam: 1.80 cm Marcina Millard MD Electronically signed by Marcina Millard MD Signature Date/Time: 02/14/2021/1:28:17 PM    Final        LOS: 2 days   Osvaldo Shipper  [facility] Pager on [email or web address]  02/15/2021, 10:53 AM

## 2021-02-16 DIAGNOSIS — M79606 Pain in leg, unspecified: Secondary | ICD-10-CM | POA: Diagnosis not present

## 2021-02-16 DIAGNOSIS — I998 Other disorder of circulatory system: Secondary | ICD-10-CM | POA: Diagnosis not present

## 2021-02-16 LAB — CBC
HCT: 32.8 % — ABNORMAL LOW (ref 36.0–46.0)
Hemoglobin: 10.9 g/dL — ABNORMAL LOW (ref 12.0–15.0)
MCH: 30.4 pg (ref 26.0–34.0)
MCHC: 33.2 g/dL (ref 30.0–36.0)
MCV: 91.4 fL (ref 80.0–100.0)
Platelets: 124 10*3/uL — ABNORMAL LOW (ref 150–400)
RBC: 3.59 MIL/uL — ABNORMAL LOW (ref 3.87–5.11)
RDW: 13.6 % (ref 11.5–15.5)
WBC: 8.7 10*3/uL (ref 4.0–10.5)
nRBC: 0 % (ref 0.0–0.2)

## 2021-02-16 LAB — BASIC METABOLIC PANEL
Anion gap: 6 (ref 5–15)
BUN: 20 mg/dL (ref 8–23)
CO2: 25 mmol/L (ref 22–32)
Calcium: 8.7 mg/dL — ABNORMAL LOW (ref 8.9–10.3)
Chloride: 108 mmol/L (ref 98–111)
Creatinine, Ser: 0.68 mg/dL (ref 0.44–1.00)
GFR, Estimated: >60 mL/min (ref >60)
Glucose, Bld: 118 mg/dL — ABNORMAL HIGH (ref 70–99)
Potassium: 4.1 mmol/L (ref 3.5–5.1)
Sodium: 139 mmol/L (ref 135–145)

## 2021-02-16 MED ORDER — ATORVASTATIN CALCIUM 20 MG PO TABS: 80.0000 mg | ORAL_TABLET | Freq: Every day | ORAL | 2 refills | Status: AC

## 2021-02-16 MED ORDER — APIXABAN 5 MG PO TABS: 5.0000 mg | ORAL_TABLET | Freq: Two times a day (BID) | ORAL | 2 refills | Status: DC

## 2021-02-16 MED ORDER — POLYETHYLENE GLYCOL 3350 17 G PO PACK: 17.0000 g | PACK | Freq: Every day | ORAL | 0 refills | Status: AC

## 2021-02-16 MED ORDER — TRAMADOL HCL 50 MG PO TABS: 50.0000 mg | ORAL_TABLET | Freq: Three times a day (TID) | ORAL | 0 refills | Status: DC | PRN

## 2021-02-16 MED ORDER — ASPIRIN 81 MG PO TBEC: 81.0000 mg | DELAYED_RELEASE_TABLET | Freq: Every day | ORAL | 11 refills | Status: DC

## 2021-02-16 NOTE — Discharge Instructions (Signed)
Peripheral Vascular Disease  Peripheral vascular disease (PVD) is a disease of the blood vessels that carry blood from the heart to the rest of the body. PVD is also called peripheral artery disease (PAD) or poor circulation. PVD affects most of the body. But it affects the legs and feet the most. PVD can lead to acute limb ischemia. This happens when there is a sudden stop of blood flow to an arm or leg. This is a medical emergency. What are the causes? The most common cause of PVD is a buildup of a fatty substance (plaque) inside your arteries. This decreases blood flow. Plaque can break off and block blood in a smaller artery. This can lead to acute limb ischemia. Other common causes of PVD include:  Blood clots inside the blood vessels.  Injuries to blood vessels.  Irritation and swelling of blood vessels.  Sudden tightening of the blood vessel (spasms). What increases the risk?  A family history of PVD.  Medical conditions, including: ? High cholesterol. ? Diabetes. ? High blood pressure. ? Heart disease. ? Past problems with blood clots. ? Past injury, such as burns or a broken bone.  Other conditions, such as: ? Buerger's disease. This is caused by swollen or irritated blood vessels in your hands and feet. ? Arthritis. ? Birth defects that affect the arteries in your legs. ? Kidney disease.  Using tobacco or nicotine products.  Not getting enough exercise.  Being very overweight (obese).  Being 50 years old or older. What are the signs or symptoms?  Cramps in your butt, legs, and feet.  Pain and weakness in your legs when you are active that goes away when you rest.  Leg pain when at rest.  Leg numbness, tingling, or weakness.  Coldness in a leg or foot, especially when compared with the other leg or foot.  Skin or hair changes. These can include: ? Hair loss. ? Shiny skin. ? Pale or bluish skin. ? Thick toenails.  Being unable to get or keep an  erection.  Tiredness (fatigue).  Weak pulse or no pulse in the feet.  Wounds and sores on the toes, feet, or legs. These take longer to heal. How is this treated? Underlying causes are treated first. Other conditions, like diabetes, high cholesterol, and blood pressure, are also treated. Treatment may include:  Lifestyle changes, such as: ? Quitting smoking. ? Getting regular exercise. ? Having a diet low in fat and cholesterol. ? Not drinking alcohol.  Taking medicines, such as: ? Blood thinners. ? Medicines to improve blood flow. ? Medicines to improve your blood cholesterol.  Procedures to: ? Open the arteries and restore blood flow. ? Insert a small mesh tube (stent) to keep a blocked vessel open. ? Create a new path for blood to flow to the body (peripheral bypass). ? Remove dead tissue from a wound. ? Remove an affected leg or arm. Follow these instructions at home: Medicines  Take over-the-counter and prescription medicines only as told by your doctor.  If you are taking blood thinners: ? Talk with your doctor before you take any medicines that have aspirin, or NSAIDs, such as ibuprofen. ? Take medicines exactly as told. Take them at the same time each day. ? Avoid doing things that could hurt or bruise you. Take action to prevent falls. ? Wear an alert bracelet or carry a card that shows you are taking blood thinners. Lifestyle  Get regular exercise. Ask your doctor about how to stay active.    Talk with your doctor about keeping a healthy weight. If needed, ask about losing weight.  Eat a diet that is low in fat and cholesterol. If you need help, talk with your doctor.  Do not drink alcohol.  Do not smoke or use any products that contain nicotine or tobacco. If you need help quitting, ask your doctor.      General instructions  Take good care of your feet. To do this: ? Wear shoes that fit well and feel good. ? Check your feet often for any cuts or  sores.  Get a flu shot (influenza vaccine) each year.  Keep all follow-up visits. Where to find more information  Society [employer] Vascular [employer]: vascular.org  American Heart Association: heart.org  National Heart, Lung, and Blood Institute: nhlbi.nih.gov Contact a doctor if:  You have cramps in your legs when you walk.  You have leg pain when you rest.  Your leg or foot feels cold.  Your skin changes.  You cannot get or keep an erection.  You have cuts or sores on your legs or feet that do not heal. Get help right away if:  You have sudden changes in the color and feeling of your arms or legs, such as: ? Your arm or leg turns cold, numb, and blue. ? Your arm or leg becomes red, warm, swollen, painful, or numb.  You have any signs of a stroke. "BE FAST" is an easy way to remember the main warning signs: ? B - Balance. Dizziness, sudden trouble walking, or loss of balance. ? E - Eyes. Trouble seeing or a change in how you see. ? F - Face. Sudden weakness or loss of feeling of the face. The face or eyelid may droop on one side. ? A - Arms. Weakness or loss of feeling in an arm. This happens all of a sudden and most often on one side of the body. ? S - Speech. Sudden trouble speaking, slurred speech, or trouble understanding what people say. ? T - Time. Time to call emergency services. Write down what time symptoms started.  You have other signs of a stroke, such as: ? A sudden, very bad headache with no known cause. ? Feeling like you may vomit (nausea). ? Vomiting. ? A seizure.  You have chest pain or trouble breathing. These symptoms may be an emergency. Get help right away. Call your local emergency services (911 in the [place]  Do not wait to see if the symptoms will go away.  Do not drive yourself to the hospital. Summary  Peripheral vascular disease (PVD) is a disease of the blood vessels.  PVD affects the legs and feet the most.  Symptoms may include leg  pain or leg numbness, tingling, and weakness.  Treatment may include lifestyle changes, medicines, and procedures. This information is not intended to replace advice given to you by your health care provider. Make sure you discuss any questions you have with your health care provider. Document Revised: 03/10/2020 Document Reviewed: 03/10/2020 [employer] Patient Education  2021 [employer]  

## 2021-02-16 NOTE — Discharge Summary (Signed)
[facility]  Physician Discharge Summary   Patient ID: Alison Russo MRN: [id number] DOB/AGE: Nov 07, 1925 87 y.o.  Admit date: 02/12/2021 Discharge date: 02/16/2021    PCP: Gracelyn Nurse, MD  DISCHARGE DIAGNOSES:  Ischemic right lower extremity status post angioplasty and stent placement Chronic atrial fibrillation Coronary artery disease Hyperlipidemia Essential hypertension Obstructive sleep apnea  RECOMMENDATIONS FOR OUTPATIENT FOLLOW UP: 1. Outpatient follow-up with vascular surgery 2. Outpatient follow-up with cardiology    Home Health: None Equipment/Devices: None  CODE STATUS: Full code  DISCHARGE CONDITION: fair  Diet recommendation: Heart healthy  INITIAL HISTORY: 87 y.o female with significant PMH including atrial fibrillation no longer on anticoagulation, essential hypertension, obstructive sleep apnea, History of recurrent pulmonary embolism, History of coronary artery disease who presented to the emergency department with complaints of pain in the right leg.  In the ED she was noted to have ischemic changes to her right lower extremity.  Vascular surgery was consulted and the patient was emergently taken for angiogram and underwent thrombectomy and angioplasty.  She was subsequently admitted to the ICU.  Seen initially by the intensivist and then transferred to hospitalist service.  Consultants: Vascular surgery.  Cardiology  Procedures:    5/27 Procedure(s) Performed: 1. Introduction catheter intorightlower extremity 3rd order catheter placement  2.Contrast injectionrightlower extremity for distal runoff  3. Percutaneous transluminal angioplastyand stent placement rightsuperficial femoral and popliteal arteries with Viabahn stents postdilated to 6 mm 4. Percutaneous transluminal angioplastyright anterior tibial to 3 mm  5. Mechanical thrombectomy of the right  common femoral profunda femoris superficial femoral popliteal and tibioperoneal trunk with the penumbra CAT 7. 6.Star close closureleftcommon femoral arteriotomy   Transthoracic echocardiogram 1. Left ventricular ejection fraction, by estimation, is 55 to 60%. The  left ventricle has normal function. The left ventricle has no regional  wall motion abnormalities. Left ventricular diastolic parameters are  indeterminate.  2. Right ventricular systolic function is normal. The right ventricular  size is normal.  3. The mitral valve is normal in structure. Mild mitral valve  regurgitation. No evidence of mitral stenosis.  4. The aortic valve is normal in structure. Aortic valve regurgitation is  not visualized. Mild to moderate aortic valve sclerosis/calcification is  present, without any evidence of aortic stenosis.  5. The inferior vena cava is normal in size with greater than 50%  respiratory variability, suggesting right atrial pressure of 3 mmHg.     HOSPITAL COURSE:   Acute ischemia right lower extremity Patient underwent CT angiogram which showed evidence for thromboembolism to the blood supply of the right lower extremity.  Patient was seen by vascular surgery and underwent emergent angioplasty and thrombectomy. There is concern for embolization.  Patient was placed on heparin infusion. Long-term anticoagulation was discussed with patient.   Echocardiogram was done and shows normal systolic function.  No significant abnormalities noted.   After further discussions with patient and her daughter by cardiology, patient was started on Eliquis. She does mention soreness in her right calf when she pushes on that area.  She continues to have good pulses in the right foot.  She was asked not to press the calf area. Patient also had prolonged oozing from her angiogram site in the left groin.  This has resolved.  Discussed with vascular surgery, Dr. Myra Gianotti, this morning.   He recommends removing the dressing, ambulating her and discharging the patient home.  History of chronic atrial fibrillation She was on Eliquis up until July 2021 when it was discontinued after  discussions with cardiology and primary care provider.  This was because she could not "tolerate it".  Apparently developed some "hematomas".  Sustained a lower extremity fracture and developed bleeding as result of being on anticoagulation. However considering this new event and history of recurrent pulmonary embolism in the past the risk of future thromboembolism is very high.   Cardiology was consulted and they also recommended Eliquis which was initiated.  Heart rate is controlled on metoprolol.   Slight drop in hemoglobin as thought to be dilutional.  No evidence of overt bleeding.  Coronary artery disease Stable.  Continue home medication  Hyperlipidemia  Essential hypertension Stable for the most part  History of obstructive sleep apnea CPAP at nighttime.   Patient is stable.  Does not want to be evaluated with physical therapy.  Daughter is at the bedside.  Okay for discharge home today.  PERTINENT LABS:  The results of significant diagnostics from this hospitalization (including imaging, microbiology, ancillary and laboratory) are listed below for reference.    Microbiology: Recent Results (from the past 240 hour(s))  MRSA PCR Screening     Status: Abnormal   Collection Time: 02/12/21  3:30 PM   Specimen: Nasal Mucosa; Nasopharyngeal  Result Value Ref Range Status   MRSA by PCR POSITIVE (A) NEGATIVE Final    Comment:        The GeneXpert MRSA Assay (FDA approved for NASAL specimens only), is one component of a comprehensive MRSA colonization surveillance program. It is not intended to diagnose MRSA infection nor to guide or monitor treatment for MRSA infections. RESULT CALLED TO, READ BACK BY AND VERIFIED WITH: OLIVEA SONNEN RN [phone] 02/13/21 HNM Performed at  [facility], [address] [place], [place] [address]   Resp Panel by RT-PCR (Flu A&B, Covid) Nasopharyngeal Swab     Status: None   Collection Time: 02/12/21  8:50 PM   Specimen: Nasopharyngeal Swab; Nasopharyngeal(NP) swabs in vial transport medium  Result Value Ref Range Status   SARS Coronavirus 2 by RT PCR NEGATIVE NEGATIVE Final    Comment: (NOTE) SARS-CoV-2 target nucleic acids are NOT DETECTED.  The SARS-CoV-2 RNA is generally detectable in upper respiratory specimens during the acute phase of infection. The lowest concentration of SARS-CoV-2 viral copies this assay can detect is 138 copies/mL. A negative result does not preclude SARS-Cov-2 infection and should not be used as the sole basis for treatment or other patient management decisions. A negative result may occur with  improper specimen collection/handling, submission of specimen other than nasopharyngeal swab, presence of viral mutation(s) within the areas targeted by this assay, and inadequate number of viral copies(<138 copies/mL). A negative result must be combined with clinical observations, patient history, and epidemiological information. The expected result is Negative.  Fact Sheet for Patients:  BloggerCourse.com  Fact Sheet for Healthcare Providers:  SeriousBroker.it  This test is no t yet approved or cleared by the [place] FDA and  has been authorized for detection and/or diagnosis of SARS-CoV-2 by FDA under an Emergency Use Authorization (EUA). This EUA will remain  in effect (meaning this test can be used) for the duration of the COVID-19 declaration under Section 564(b)(1) of the Act, 21 U.S.C.section 360bbb-3(b)(1), unless the authorization is terminated  or revoked sooner.       Influenza A by PCR NEGATIVE NEGATIVE Final   Influenza B by PCR NEGATIVE NEGATIVE Final    Comment: (NOTE) The Xpert Xpress SARS-CoV-2/FLU/RSV plus  assay is intended as an aid in  the diagnosis of influenza from Nasopharyngeal swab specimens and should not be used as a sole basis for treatment. Nasal washings and aspirates are unacceptable for Xpert Xpress SARS-CoV-2/FLU/RSV testing.  Fact Sheet for Patients: BloggerCourse.com  Fact Sheet for Healthcare Providers: SeriousBroker.it  This test is not yet approved or cleared by the [place] FDA and has been authorized for detection and/or diagnosis of SARS-CoV-2 by FDA under an Emergency Use Authorization (EUA). This EUA will remain in effect (meaning this test can be used) for the duration of the COVID-19 declaration under Section 564(b)(1) of the Act, 21 U.S.C. section 360bbb-3(b)(1), unless the authorization is terminated or revoked.  Performed at [facility], [address] [place], [place] [address]      Labs:  COVID-19 Labs   Lab Results  Component Value Date   SARSCOV2NAA NEGATIVE 02/12/2021   SARSCOV2NAA NEGATIVE 04/01/2020   SARSCOV2NAA NEGATIVE 03/26/2020      Basic Metabolic Panel: Recent Labs  Lab 02/12/21 1643 02/13/21 0953 02/14/21 0452 02/15/21 0525 02/16/21 0421  NA 138 137 137 139 139  K 4.3 4.4 4.3 4.1 4.1  CL 106 105 107 110 108  CO2 GLUCOSE 87 109* 118* 105* 118*  BUN CREATININE 0.60 0.76 0.76 0.71 0.68  CALCIUM 9.2 8.4* 8.7* 8.8* 8.7*   Liver Function Tests: Recent Labs  Lab 02/12/21 1643  AST 36  ALT 23  ALKPHOS 75  BILITOT 1.2  PROT 7.2  ALBUMIN 3.6   CBC: Recent Labs  Lab 02/12/21 1643 02/13/21 0953 02/14/21 0452 02/15/21 0525 02/16/21 0421  WBC 7.2 9.0 8.4 8.7 8.7  NEUTROABS 4.7 7.2  --   --   --   HGB 13.6 12.6 12.0 11.4* 10.9*  HCT 40.9 37.2 35.0* 34.3* 32.8*  MCV 92.1 91.6 90.4 92.5 91.4  PLT 126* 129* 121* 114* 124*    CBG: Recent Labs  Lab 02/13/21 0038  GLUCAP 98     IMAGING STUDIES CT Angio  Aortobifemoral W and/or Wo Contrast  Result Date: 02/12/2021 CLINICAL DATA:  Right leg pain and cramping EXAM: CT ANGIOGRAPHY OF ABDOMINAL AORTA WITH ILIOFEMORAL RUNOFF TECHNIQUE: Multidetector CT imaging of the abdomen, pelvis and lower extremities was performed using the standard protocol during bolus administration of intravenous contrast. Multiplanar CT image reconstructions and MIPs were obtained to evaluate the vascular anatomy. CONTRAST:  OMNIPAQUE IOHEXOL 350 MG/ML SOLN COMPARISON:  None. FINDINGS: VASCULAR Aorta: The visualized distal thoracic aorta is of normal caliber. The abdominal aorta is of normal caliber. No evidence of aneurysm or dissection. Extensive atherosclerotic calcification without evidence of hemodynamically significant stenosis. No significant ulcerated or soft plaque identified. No periaortic inflammatory change. 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 Distally widely patent without evidence of aneurysm or dissection. Normal anatomic configuration. SMA: Mild atherosclerotic calcification within the proximal segment. Widely patent. No aneurysm or dissection. Renals: Less than 50% stenosis of the right renal artery at its origin. Single renal arteries bilaterally. Normal vascular morphology. No aneurysm. IMA: Less than 50% stenosis at its origin.  Distally widely patent. RIGHT Lower Extremity Inflow: Extensive atherosclerotic calcification. No significant soft or ulcerated plaque. No hemodynamically significant stenosis. Internal iliac artery is patent at its origin. No aneurysm or dissection. Outflow: There is a a branching intraluminal filling defect within the right common femoral artery extending into the proximal profundus and superficial femoral artery in keeping with an arterial embolus. This results  in segmental near occlusion of the common femoral artery. The profundus femoral artery, distally, is abruptly occluded beyond the a second muscular  branch related to distal embolization and is subsequently reconstituted. The superficial femoral artery is segmentally occluded at the level of the adductor hiatus secondary to distal embolization with the embolus extending through the P1 segment of the popliteal artery. Runoff: There is, again, distal embolization with occlusion of the P3 segment of the a popliteal artery with subsequent reconstitution of the a three-vessel runoff. The anterior tibial artery terminates at the level of the distal tibial diaphysis. The posterior tibial artery demonstrates occlusion at the level of the tibial plafond and. The peroneal artery terminates at the level of the distal tibial diaphysis LEFT Lower Extremity Inflow: Extensive atherosclerotic calcification. No significant soft or ulcerated plaque. No aneurysm or dissection. No evidence of hemodynamically significant stenosis. Internal iliac arteries patent proximally. Outflow: Scattered atherosclerotic plaque throughout the superficial femoral artery without hemodynamically significant stenosis. Lower extremity arterial outflow is otherwise widely patent. Runoff: Popliteal artery is widely patent. There is, however, embolic occlusion of the tibioperoneal trunk beyond the takeoff of the anterior tibial artery in keeping with distal embolization. Similarly, there is abrupt occlusion of the anterior tibial artery proximally secondary to embolic disease. Relatively weak distal reconstitution without opacification of the dorsalis pedis artery and tibioperoneal trunk. Veins: No obvious venous abnormality within the limitations of this arterial phase study. Review of the MIP images confirms the above findings. NON-VASCULAR Lower chest: Cardiac size is mildly enlarged. Visualized lung bases are clear. Moderate hiatal hernia. Hepatobiliary: Liver contour is nodular in keeping with changes of cirrhosis. Cholelithiasis without pericholecystic inflammatory change identified. Pancreas: 18  mm cystic lesion of the identified within the mid body of the pancreas, indeterminate. This may represent a simple pancreatic cyst, side branch ectasia, pseudocyst, or a cystic neoplasm. This is slightly enlarged from prior examination of 01/19/2018 (previously measuring 16 mm) Spleen: Unremarkable Adrenals/Urinary Tract: The adrenal glands are unremarkable. Mild cortical scarring within the right kidney. The kidneys are otherwise unremarkable. The bladder demonstrates multiple small diverticula suggesting changes of a bladder outlet obstruction. The bladder is not distended. Stomach/Bowel: Severe sigmoid diverticulosis. Moderate stool throughout the colon. The stomach, small bowel, and large bowel are otherwise unremarkable. No evidence of obstruction or focal inflammation. The appendix is not visualized and is likely absent. Lymphatic: No pathologic adenopathy within the abdomen and pelvis. Reproductive: Involuted calcified fibroid within the uterus. The pelvic organs are otherwise unremarkable. Other: Small fat containing left inguinal hernia. Musculoskeletal: Right hip ORIF has been performed utilizing a gamma nail device. Osseous structures are diffusely osteopenic. No acute bone abnormality. No lytic or blastic bone lesion. IMPRESSION: VASCULAR Evidence of embolic disease with multiple levels of occlusion within the right lower extremity arterial outflow and runoff. On the right, embolus is noted within the common femoral artery a branching into the profundus femoral artery and superficial femoral artery proximally. Subsequent occlusion of the mid profundus femoral artery. Distal embolization with occlusion of the a terminal superficial femoral artery and P1 segment of the popliteal artery. Distal embolization with occlusion of the P3 segment of the popliteal artery. Three-vessel runoff is identified, but terminates at the level of the a distal tibia and is unclear whether this is related to poor antegrade  flow or small particulate embolization. On the left, abrupt occlusion of the a anterior tibial artery proximally and tibioperoneal trunk secondary to distal embolization. Poor reconstitution of the distal vasculature with nonvisualization  of the dorsalis pedis artery and plantar arch. 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 No identified embolic source within the abdominal aorta and lower extremity arterial inflow. NON-VASCULAR Mild cardiomegaly. Moderate hiatal hernia. Morphologic changes in keeping with cirrhosis. 18 mm cystic lesion within the mid body of the pancreas, indeterminate. This demonstrates slight interval growth since prior examination. Differential considerations are as listed above. Sigmoid diverticulosis. Aortic Atherosclerosis (ICD10-I70.0). These results were called by telephone at the time of interpretation on 02/12/2021 at 8:26 pm to provider [facility] , who verbally acknowledged these results. Electronically Signed   By: Helyn Numbers MD   On: 02/12/2021 20:30   PERIPHERAL VASCULAR CATHETERIZATION  Result Date: 02/13/2021 See Op Note  US Venous Img Lower Unilateral Right  Result Date: 02/12/2021 CLINICAL DATA:  Leg pain and swelling right EXAM: Right LOWER EXTREMITY VENOUS DOPPLER ULTRASOUND TECHNIQUE: Gray-scale sonography with compression, as well as color and duplex ultrasound, were performed to evaluate the deep venous system(s) from the level of the common femoral vein through the popliteal and proximal calf veins. COMPARISON:  05/16/2020, 11/19/2019 FINDINGS: VENOUS Normal compressibility of the common femoral, superficial femoral, and popliteal veins, as well as the visualized calf veins. Slightly limited visualization of the distal femoral vein. Visualized portions of profunda femoral vein and great saphenous vein unremarkable. No filling defects to suggest DVT on grayscale or color Doppler imaging. Doppler waveforms show normal direction of venous  flow, normal respiratory plasticity and response to augmentation. Limited views of the contralateral common femoral vein are unremarkable. OTHER Limited images of right popliteal artery below the knee demonstrate occlusion. Limitations: Small caliber vessels at and calcific disease of the arteries. IMPRESSION: 1. Negative for acute right lower extremity DVT 2. Findings concerning for occlusion/thrombus within the right popliteal artery. Critical Value/emergent results were called by telephone at the time of interpretation on 02/12/2021 at 5:51 pm to provider Shaune Pollack , who verbally acknowledged these results. Electronically Signed   By: Jasmine Pang M.D.   On: 02/12/2021 17:51   ECHOCARDIOGRAM COMPLETE  Result Date: 02/14/2021    ECHOCARDIOGRAM REPORT   Patient Name:   Alison Russo Date of Exam: 02/14/2021 Medical Rec #:  [id number]        Height:       66.0 in Accession #:    [id number]       Weight:       172.0 lb Date of Birth:  1926-08-06        BSA:          1.875 m Patient Age:    89 years         BP:           122/76 mmHg Patient Gender: F                HR:           68 bpm. Exam Location:  [facility] Procedure: 2D Echo Indications:     Ischemia of right lower extremity  History:         Patient has prior history of Echocardiogram examinations, most                  recent 03/28/2020.  Sonographer:     Overton Mam RDCS Referring Phys:  [id number] Osvaldo Shipper Diagnosing Phys: Marcina Millard MD  Sonographer Comments: Technically difficult study due to poor echo windows. Image acquisition challenging due to respiratory motion. IMPRESSIONS  1. Left ventricular ejection  fraction, by estimation, is 55 to 60%. The left ventricle has normal function. The left ventricle has no regional wall motion abnormalities. Left ventricular diastolic parameters are indeterminate.  2. Right ventricular systolic function is normal. The right ventricular size is normal.  3. The mitral valve is normal in structure. Mild  mitral valve regurgitation. No evidence of mitral stenosis.  4. The aortic valve is normal in structure. Aortic valve regurgitation is not visualized. Mild to moderate aortic valve sclerosis/calcification is present, without any evidence of aortic stenosis.  5. The inferior vena cava is normal in size with greater than 50% respiratory variability, suggesting right atrial pressure of 3 mmHg. FINDINGS  Left Ventricle: Left ventricular ejection fraction, by estimation, is 55 to 60%. The left ventricle has normal function. The left ventricle has no regional wall motion abnormalities. The left ventricular internal cavity size was normal in size. There is  no left ventricular hypertrophy. Left ventricular diastolic parameters are indeterminate. Right Ventricle: The right ventricular size is normal. No increase in right ventricular wall thickness. Right ventricular systolic function is normal. Left Atrium: Left atrial size was normal in size. Right Atrium: Right atrial size was normal in size. Pericardium: There is no evidence of pericardial effusion. Mitral Valve: The mitral valve is normal in structure. Mild mitral valve regurgitation. No evidence of mitral valve stenosis. Tricuspid Valve: The tricuspid valve is normal in structure. Tricuspid valve regurgitation is mild . No evidence of tricuspid stenosis. Aortic Valve: The aortic valve is normal in structure. Aortic valve regurgitation is not visualized. Mild to moderate aortic valve sclerosis/calcification is present, without any evidence of aortic stenosis. Aortic valve peak gradient measures 8.3 mmHg. Pulmonic Valve: The pulmonic valve was normal in structure. Pulmonic valve regurgitation is not visualized. No evidence of pulmonic stenosis. Aorta: The aortic root is normal in size and structure. Venous: The inferior vena cava is normal in size with greater than 50% respiratory variability, suggesting right atrial pressure of 3 mmHg. IAS/Shunts: No atrial level shunt  detected by color flow Doppler.  LEFT VENTRICLE PLAX 2D LVIDd:         4.70 cm  Diastology LVIDs:         3.23 cm  LV e' medial:    5.22 cm/s LV PW:         1.13 cm  LV E/e' medial:  19.3 LV IVS:        1.06 cm  LV e' lateral:   7.62 cm/s LVOT diam:     1.80 cm  LV E/e' lateral: 13.3 LV SV:         27 LV SV Index:   14 LVOT Area:     2.54 cm  RIGHT VENTRICLE RV S prime:     10.90 cm/s TAPSE (M-mode): 1.6 cm LEFT ATRIUM            Index LA diam:      6.10 cm  3.25 cm/m LA Vol (A4C): 104.0 ml 55.45 ml/m  AORTIC VALVE                PULMONIC VALVE AV Area (Vmax): 1.18 cm    PV Vmax:       0.94 m/s AV Vmax:        144.00 cm/s PV Peak grad:  3.5 mmHg AV Peak Grad:   8.3 mmHg LVOT Vmax:      66.60 cm/s LVOT Vmean:     42.600 cm/s LVOT VTI:       0.106  m  AORTA Ao Root diam: 3.20 cm Ao Asc diam:  3.10 cm MITRAL VALVE                TRICUSPID VALVE MV Area (PHT): 6.32 cm     TV Peak grad:   18.5 mmHg MV Decel Time: 120 msec     TV Vmax:        2.15 m/s MV E velocity: 101.00 cm/s                             SHUNTS                             Systemic VTI:  0.11 m                             Systemic Diam: 1.80 cm Marcina MillardAlexander Paraschos MD Electronically signed by Marcina MillardAlexander Paraschos MD Signature Date/Time: 02/14/2021/1:28:17 PM    Final     DISCHARGE EXAMINATION: Vitals:   02/15/21 1949 02/16/21 0023 02/16/21 0438 02/16/21 0806  BP: 112/71 136/84 137/85 136/88  Pulse: 72 81 70 77  Resp: 20 18 16 18   Temp: 98.3 F (36.8 C) 99.6 F (37.6 C) 98.1 F (36.7 C) 98 F (36.7 C)  TempSrc: Oral  Oral Oral  SpO2: 98% 95% 96% 96%  Weight:      Height:       General appearance: Awake alert.  In no distress Resp: Clear to auscultation bilaterally.  Normal effort Cardio: S1-S2 is normal regular.  No S3-S4.  No rubs murmurs or bruit GI: Abdomen is soft.  Nontender nondistended.  Bowel sounds are present normal.  No masses organomegaly     DISPOSITION: Home  Discharge Instructions    Call MD for:  difficulty  breathing, headache or visual disturbances   Complete by: As directed    Call MD for:  extreme fatigue   Complete by: As directed    Call MD for:  persistant dizziness or light-headedness   Complete by: As directed    Call MD for:  persistant nausea and vomiting   Complete by: As directed    Call MD for:  redness, tenderness, or signs of infection (pain, swelling, redness, odor or green/yellow discharge around incision site)   Complete by: As directed    Call MD for:  severe uncontrolled pain   Complete by: As directed    Call MD for:  temperature >100.4   Complete by: As directed    Diet - low sodium heart healthy   Complete by: As directed    Discharge instructions   Complete by: As directed    Please be sure to follow-up with the vascular surgeons office in 2 weeks time.  Call for an appointment. Seek attention if you notice any bleeding from any site.  You were cared for by a hospitalist during your hospital stay. If you have any questions about your discharge medications or the care you received while you were in the hospital after you are discharged, you can call the unit and asked to speak with the hospitalist on call if the hospitalist that took care of you is not available. Once you are discharged, your primary care physician will handle any further medical issues. Please note that NO REFILLS for any discharge medications will be authorized once you are discharged, as it  is imperative that you return to your primary care physician (or establish a relationship with a primary care physician if you do not have one) for your aftercare needs so that they can reassess your need for medications and monitor your lab values. If you do not have a primary care physician, you can call [phone] for a physician referral.   Increase activity slowly   Complete by: As directed    No wound care   Complete by: As directed         Allergies as of 02/16/2021   No Known Allergies     Medication  List    TAKE these medications   acetaminophen 325 MG tablet Commonly known as: TYLENOL Take 2 tablets (650 mg total) by mouth every 6 (six) hours as needed for mild pain (or Fever >/= 101).   apixaban 5 MG Tabs tablet Commonly known as: ELIQUIS Take 1 tablet (5 mg total) by mouth 2 (two) times daily.   aspirin 81 MG EC tablet Take 1 tablet (81 mg total) by mouth daily. Swallow whole.   atorvastatin 20 MG tablet Commonly known as: LIPITOR Take 4 tablets (80 mg total) by mouth daily.   feeding supplement Liqd Take 237 mLs by mouth 2 (two) times daily between meals.   metoprolol tartrate 25 MG tablet Commonly known as: LOPRESSOR Take 1 tablet (25 mg total) by mouth 2 (two) times daily.   OPCON-A OP Place 1-2 drops into both eyes 3 (three) times daily as needed (for itchy/irritated eyes.).   polyethylene glycol 17 g packet Commonly known as: MIRALAX / GLYCOLAX Take 17 g by mouth daily. What changed: additional instructions   PRESERVISION AREDS 2+MULTI VIT PO Take 1 tablet by mouth 2 (two) times daily.   sertraline 100 MG tablet Commonly known as: ZOLOFT Take 1 tablet (100 mg total) by mouth daily.   traMADol 50 MG tablet Commonly known as: ULTRAM Take 1 tablet (50 mg total) by mouth every 8 (eight) hours as needed for severe pain. What changed: reasons to take this         Follow-up Information    Schnier, Latina Craver, MD Follow up in 2 week(s).   Specialties: Vascular Surgery, Cardiology, Radiology, Vascular Surgery Why: ABI's  Contact information: [address] [place] [address] [phone]               TOTAL DISCHARGE TIME: 35 minutes  Wilmore Holsomback Rito Ehrlich  [facility] Pager on [email or web address]  02/17/2021, 12:32 PM

## 2021-02-16 NOTE — Progress Notes (Signed)
Safeguard dressing removed from L groin. No signs of bleeding observed no other complications. Gauze and transparent dressing placed.

## 2021-02-16 NOTE — Progress Notes (Signed)
Alison Russo to be D/C'd Home per MD order.  Discussed prescriptions and follow up appointments with the patient. Prescriptions given to patient, medication list explained in detail. Pt verbalized understanding.  Allergies as of 02/16/2021   No Known Allergies     Medication List    TAKE these medications   acetaminophen 325 MG tablet Commonly known as: TYLENOL Take 2 tablets (650 mg total) by mouth every 6 (six) hours as needed for mild pain (or Fever >/= 101).   apixaban 5 MG Tabs tablet Commonly known as: ELIQUIS Take 1 tablet (5 mg total) by mouth 2 (two) times daily.   aspirin 81 MG EC tablet Take 1 tablet (81 mg total) by mouth daily. Swallow whole.   atorvastatin 20 MG tablet Commonly known as: LIPITOR Take 4 tablets (80 mg total) by mouth daily. Start taking on: Feb 17, 2021   feeding supplement Liqd Take 237 mLs by mouth 2 (two) times daily between meals.   metoprolol tartrate 25 MG tablet Commonly known as: LOPRESSOR Take 1 tablet (25 mg total) by mouth 2 (two) times daily.   OPCON-A OP Place 1-2 drops into both eyes 3 (three) times daily as needed (for itchy/irritated eyes.).   polyethylene glycol 17 g packet Commonly known as: MIRALAX / GLYCOLAX Take 17 g by mouth daily. Start taking on: Feb 17, 2021 What changed: additional instructions   PRESERVISION AREDS 2+MULTI VIT PO Take 1 tablet by mouth 2 (two) times daily.   sertraline 100 MG tablet Commonly known as: ZOLOFT Take 1 tablet (100 mg total) by mouth daily.   traMADol 50 MG tablet Commonly known as: ULTRAM Take 1 tablet (50 mg total) by mouth every 8 (eight) hours as needed for severe pain. What changed: reasons to take this       Vitals:   02/16/21 0438 02/16/21 0806  BP: 137/85 136/88  Pulse: 70 77  Resp: 16 18  Temp: 98.1 F (36.7 C) 98 F (36.7 C)  SpO2: 96% 96%    Skin clean, dry and intact without evidence of skin break down, no evidence of skin tears noted. IV catheter  discontinued intact. Site without signs and symptoms of complications. Dressing and pressure applied. Pt denies pain at this time. No complaints noted.  An After Visit Summary was printed and given to the patient. Patient escorted via WC, and D/C home via private auto.  Rigoberto Noel

## 2021-02-16 NOTE — Care Management Important Message (Signed)
Important Message  Patient Details  Name: Alison Russo MRN: [id number] Date of Birth: August 12, 1926   Medicare Important Message Given:  Yes     Olegario Messier A Alanna Storti 02/16/2021, 11:32 AM

## 2021-02-18 DIAGNOSIS — I998 Other disorder of circulatory system: Secondary | ICD-10-CM | POA: Diagnosis not present

## 2021-02-18 DIAGNOSIS — Z09 Encounter for follow-up examination after completed treatment for conditions other than malignant neoplasm: Secondary | ICD-10-CM | POA: Diagnosis not present

## 2021-02-24 ENCOUNTER — Other Ambulatory Visit (INDEPENDENT_AMBULATORY_CARE_PROVIDER_SITE_OTHER): Payer: Self-pay | Admitting: Vascular Surgery

## 2021-02-24 ENCOUNTER — Encounter (INDEPENDENT_AMBULATORY_CARE_PROVIDER_SITE_OTHER): Payer: Self-pay | Admitting: Vascular Surgery

## 2021-02-24 DIAGNOSIS — I998 Other disorder of circulatory system: Secondary | ICD-10-CM

## 2021-02-24 DIAGNOSIS — Z9862 Peripheral vascular angioplasty status: Secondary | ICD-10-CM

## 2021-02-25 ENCOUNTER — Ambulatory Visit (INDEPENDENT_AMBULATORY_CARE_PROVIDER_SITE_OTHER): Payer: PPO

## 2021-02-25 ENCOUNTER — Encounter (INDEPENDENT_AMBULATORY_CARE_PROVIDER_SITE_OTHER): Payer: Self-pay | Admitting: Nurse Practitioner

## 2021-02-25 ENCOUNTER — Ambulatory Visit (INDEPENDENT_AMBULATORY_CARE_PROVIDER_SITE_OTHER): Payer: PPO | Admitting: Nurse Practitioner

## 2021-02-25 ENCOUNTER — Other Ambulatory Visit: Payer: Self-pay

## 2021-02-25 ENCOUNTER — Encounter (INDEPENDENT_AMBULATORY_CARE_PROVIDER_SITE_OTHER): Payer: PPO

## 2021-02-25 VITALS — BP 120/81 | HR 88 | Ht 66.0 in | Wt 175.0 lb

## 2021-02-25 DIAGNOSIS — I10 Essential (primary) hypertension: Secondary | ICD-10-CM | POA: Diagnosis not present

## 2021-02-25 DIAGNOSIS — Z9862 Peripheral vascular angioplasty status: Secondary | ICD-10-CM | POA: Diagnosis not present

## 2021-02-25 DIAGNOSIS — E7849 Other hyperlipidemia: Secondary | ICD-10-CM

## 2021-02-25 DIAGNOSIS — I998 Other disorder of circulatory system: Secondary | ICD-10-CM

## 2021-03-08 ENCOUNTER — Encounter (INDEPENDENT_AMBULATORY_CARE_PROVIDER_SITE_OTHER): Payer: Self-pay | Admitting: Nurse Practitioner

## 2021-03-08 NOTE — Progress Notes (Signed)
Subjective:    Patient ID: Alison Russo, female    DOB: [birth year], 87 y.o.   MRN: [id number] Chief Complaint  Patient presents with   Follow-up    2wk [facility] F/U ischemic leg pain     Alison Russo is a 87 year old female that returns to the office for followup and review status post angiogram with intervention.  The patient initially presented to [facility] due to pain and discomfort of her right lower extremity.  She subsequently underwent angiogram on 02/13/2021 to urgently relieve ischemic symptoms.  She underwent thrombectomy of the right common femoral, profunda, SFA and posterior tibial.  There was also angioplasty and stent placed in the SFA with angioplasty of the anterior tibial artery.  The patient notes improvement in the lower extremity symptoms. No interval shortening of the patient's claudication distance or rest pain symptoms.  The patient does note some bruising with her current medical regimen.  Postoperatively the patient did have some issues with oozing as well.  No new ulcers or wounds have occurred since the last visit.   There have been no significant changes to the patient's overall health care.  The patient denies amaurosis fugax or recent TIA symptoms. There are no recent neurological changes noted. The patient denies history of DVT, PE or superficial thrombophlebitis. The patient denies recent episodes of angina or shortness of breath.   ABI's Rt= 1.17 and Lt= 0.89 there are no previous comparison studies Duplex US of the bilateral tibial arteries shows one-vessel runoff bilaterally in the anterior tibial arteries.  The right is triphasic with the left being hyperemic with a strong monophasic waveform.  The patient has good toe waveforms bilaterally.   Review of Systems  Cardiovascular:  Positive for leg swelling.  Hematological:  Bruises/bleeds easily.  All other systems reviewed and are negative.     Objective:   Physical  Exam Vitals reviewed.  HENT:     Head: Normocephalic.  Cardiovascular:     Rate and Rhythm: Normal rate.     Pulses: Decreased pulses.  Pulmonary:     Effort: Pulmonary effort is normal.  Musculoskeletal:     Right lower leg: Edema present.  Neurological:     Mental Status: She is alert and oriented to person, place, and time.  Psychiatric:        Mood and Affect: Mood normal.        Behavior: Behavior normal.        Thought Content: Thought content normal.        Judgment: Judgment normal.    BP 120/81   Pulse 88   Ht 5\' 6"  (1.676 m)   Wt 175 lb (79.4 kg)   BMI 28.25 kg/m   Past Medical History:  Diagnosis Date   Anginal pain (HCC)    CAD (coronary artery disease)    Heart murmur    Hypertension    Mood disorder (HCC)    mostly anxiety   NSTEMI (non-ST elevated myocardial infarction) (HCC) 12/22/2017   OSA on CPAP    PE (pulmonary embolism)    recurrent    Social History   Socioeconomic History   Marital status: Widowed    Spouse name: Not on file   Number of children: 2   Years of education: Not on file   Highest education level: Not on file  Occupational History   Occupation: Office work   Occupation: Custodian    Comment: 02/21/2018  Tobacco Use  Smoking status: Never   Smokeless tobacco: Never  Vaping Use   Vaping Use: Never used  Substance and Sexual Activity   Alcohol use: Never   Drug use: Never   Sexual activity: Not Currently  Other Topics Concern   Not on file  Social History Narrative   Has living will   Daughter is health care POA   Would accept resuscitation attempts   No tube feeds if cognitively unaware   Social Determinants of Health   Financial Resource Strain: Not on file  Food Insecurity: Not on file  Transportation Needs: Not on file  Physical Activity: Not on file  Stress: Not on file  Social Connections: Not on file  Intimate Partner Violence: Not on file    Past Surgical History:  Procedure Laterality  Date   BREAST CYST ASPIRATION Right    neg   CARDIAC CATHETERIZATION     CATARACT EXTRACTION, BILATERAL     COLONOSCOPY     CORONARY STENT INTERVENTION N/A 12/23/2017   Procedure: CORONARY STENT INTERVENTION;  Surgeon: Swaziland, Peter M, MD;  Location: [facility];  Service: Cardiovascular;  Laterality: N/A;   EYE SURGERY Left    "blood vessel busted behind my eye"   EYE SURGERY Right    ?vitrectomy   HYSTEROSCOPY WITH D & C N/A 10/25/2016   Procedure: DILATATION AND CURETTAGE /HYSTEROSCOPY, POLYPECTOMY;  Surgeon: Christeen Douglas, MD;  Location: [facility];  Service: Gynecology;  Laterality: N/A;   INGUINAL HERNIA REPAIR Right 1950s   INTRAMEDULLARY (IM) NAIL INTERTROCHANTERIC Right 03/27/2020   Procedure: INTRAMEDULLARY (IM) NAIL INTERTROCHANTRIC;  Surgeon: Juanell Fairly, MD;  Location: [facility];  Service: Orthopedics;  Laterality: Right;   LEFT HEART CATH AND CORONARY ANGIOGRAPHY N/A 12/21/2017   Procedure: LEFT HEART CATH AND CORONARY ANGIOGRAPHY possible PCI and stent;  Surgeon: Alwyn Pea, MD;  Location: [facility];  Service: Cardiovascular;  Laterality: N/A;   LOWER EXTREMITY ANGIOGRAPHY Right 01/11/2018   Procedure: LOWER EXTREMITY ANGIOGRAPHY;  Surgeon: Annice Needy, MD;  Location: [facility];  Service: Cardiovascular;  Laterality: Right;   LOWER EXTREMITY ANGIOGRAPHY Right 02/12/2021   Procedure: Lower Extremity Angiography;  Surgeon: Renford Dills, MD;  Location: [facility];  Service: Cardiovascular;  Laterality: Right;   PSEUDOANERYSM COMPRESSION Left 01/20/2018   Procedure: PSEUDOANERYSM COMPRESSION;  Surgeon: Renford Dills, MD;  Location: [facility];  Service: Cardiovascular;  Laterality: Left;   TONSILLECTOMY      Family History  Problem Relation Age of Onset   Breast cancer Mother 82   AAA (abdominal aortic aneurysm) Father        ruptured   Coronary artery disease Brother     No Known Allergies  CBC Latest Ref  Rng & Units 02/16/2021 02/15/2021 02/14/2021  WBC 4.0 - 10.5 K/uL 8.7 8.7 8.4  Hemoglobin 12.0 - 15.0 g/dL 10.9(L) 11.4(L) 12.0  Hematocrit 36.0 - 46.0 % 32.8(L) 34.3(L) 35.0(L)  Platelets 150 - 400 K/uL 124(L) 114(L) 121(L)      CMP     Component Value Date/Time   NA 139 02/16/2021 0421   NA 143 09/28/2017 1259   K 4.1 02/16/2021 0421   CL 108 02/16/2021 0421   CO2 25 02/16/2021 0421   GLUCOSE 118 (H) 02/16/2021 0421   BUN 20 02/16/2021 0421   BUN 16 09/28/2017 1259   CREATININE 0.68 02/16/2021 0421   CALCIUM 8.7 (L) 02/16/2021 0421   PROT 7.2 02/12/2021 1643  PROT 6.7 09/28/2017 1259   ALBUMIN 3.6 02/12/2021 1643   ALBUMIN 3.9 09/28/2017 1259   AST 36 02/12/2021 1643   ALT 23 02/12/2021 1643   ALKPHOS 75 02/12/2021 1643   BILITOT 1.2 02/12/2021 1643   BILITOT 0.3 09/28/2017 1259   GFRNONAA >60 02/16/2021 0421   GFRAA >60 05/23/2020 1820     VAS US ABI WITH/WO TBI  Result Date: 02/26/2021  LOWER EXTREMITY DOPPLER STUDY Patient Name:  Alison RakesKatherine L Findling  Date of Exam:   02/25/2021 Medical Rec #: [id number]         Accession #:    [id number] Date of Birth: 03-27-1926         Patient Gender: F Patient Age:   [age]Y Exam Location:  [facility] Procedure:      VAS US ABI WITH/WO TBI Referring Phys: [id number] Latina CraverGREGORY G SCHNIER --------------------------------------------------------------------------------  Indications: Peripheral artery disease.  Vascular Interventions: 01/11/18: Left femoral pseudoaneurysm thrombin injection;                         02/13/21: Right CFA, PFA, SFA & PT trunk thrombectomies;                         right SFA PTA/stent; right ATA angioplasty;. Performing Technologist: Jamse MeadWilliam Hosmer RT, RDMS, RVT  Examination Guidelines: A complete evaluation includes at minimum, Doppler waveform signals and systolic blood pressure reading at the level of bilateral brachial, anterior tibial, and posterior tibial arteries, when vessel segments are accessible. Bilateral  testing is considered an integral part of a complete examination. Photoelectric Plethysmograph (PPG) waveforms and toe systolic pressure readings are included as required and additional duplex testing as needed. Limited examinations for reoccurring indications may be performed as noted.  ABI Findings: +---------+------------------+-----+---------+---------------------------------+ Right    Rt Pressure (mmHg)IndexWaveform Comment                           +---------+------------------+-----+---------+---------------------------------+ Brachial 119                                                               +---------+------------------+-----+---------+---------------------------------+ Popliteal                       triphasic                                  +---------+------------------+-----+---------+---------------------------------+ ATA      143               1.17 triphasic                                  +---------+------------------+-----+---------+---------------------------------+ PTA                                      not adequately detected by  Doppler or duplex                 +---------+------------------+-----+---------+---------------------------------+ PERO                                     not adequately detected by                                                 Doppler or duplex                 +---------+------------------+-----+---------+---------------------------------+ Great Toe89                0.73 Normal                                     +---------+------------------+-----+---------+---------------------------------+ +---------+------------------+-----+----------------+------------------+ Left     Lt Pressure (mmHg)IndexWaveform        Comment            +---------+------------------+-----+----------------+------------------+ Brachial 122                                                        +---------+------------------+-----+----------------+------------------+ Popliteal                       triphasic                          +---------+------------------+-----+----------------+------------------+ ATA      108               0.89 hyperemic                          +---------+------------------+-----+----------------+------------------+ PTA                             monophasic      detected by duplex +---------+------------------+-----+----------------+------------------+ PERO                            mildly hyperemicdetected by duplex +---------+------------------+-----+----------------+------------------+ Great Toe91                0.75 Normal                             +---------+------------------+-----+----------------+------------------+ No previous ABI available for comparison.  Summary: Right: Resting right ankle-brachial index is within normal range. Unable to adequately detected posterior tibial and peroneal artery waveforms which may be due to arterial disease versus vessel calcification. The right toe-brachial index is normal. Left: Resting left ankle-brachial index indicates mild left lower extremity arterial disease at the tibial artery level. The left toe-brachial index is normal.  *See table(s) above for measurements and observations.  Electronically signed by Levora Dredge MD on 02/26/2021 at 4:46:55 PM.    Final        Assessment & Plan:   1. Ischemia of right lower extremity The  severity of the patient's case with her and discussed how absolutely necessary continuing on blood thinners is at this point in time.  Post stent placement she must remain on current anticoagulation for at least 3 months from intervention, unless there is some sort of major bleeding event.  Following that point we can discuss transitioning to some other medication to help with stent patency.  Otherwise the patient is advised to follow-up in 3  months with repeat noninvasive studies.  However the patient needs to have severe pain and swelling in her lower extremity once again she should contact her office. 2. Essential hypertension Continue antihypertensive medications as already ordered, these medications have been reviewed and there are no changes at this time.   3. Other hyperlipidemia Continue statin as ordered and reviewed, no changes at this time    Current Outpatient Medications on File Prior to Visit  Medication Sig Dispense Refill   acetaminophen (TYLENOL) 325 MG tablet Take 2 tablets (650 mg total) by mouth every 6 (six) hours as needed for mild pain (or Fever >/= 101). 30 tablet 2   apixaban (ELIQUIS) 5 MG TABS tablet Take 1 tablet (5 mg total) by mouth 2 (two) times daily. 60 tablet 2   aspirin EC 81 MG EC tablet Take 1 tablet (81 mg total) by mouth daily. Swallow whole. 30 tablet 11   atorvastatin (LIPITOR) 20 MG tablet Take 4 tablets (80 mg total) by mouth daily. 120 tablet 2   Elastic Bandages & Supports (JOBST FOR MEN KNEE HIGH/LG) MISC Use compression stockings every day     feeding supplement, ENSURE ENLIVE, (ENSURE ENLIVE) LIQD Take 237 mLs by mouth 2 (two) times daily between meals. 237 mL 12   metoprolol tartrate (LOPRESSOR) 25 MG tablet Take 1 tablet (25 mg total) by mouth 2 (two) times daily. 60 tablet 0   Multiple Vitamins-Minerals (PRESERVISION AREDS 2+MULTI VIT PO) Take 1 tablet by mouth 2 (two) times daily.     Naphazoline-Pheniramine (OPCON-A OP) Place 1-2 drops into both eyes 3 (three) times daily as needed (for itchy/irritated eyes.).     polyethylene glycol (MIRALAX / GLYCOLAX) 17 g packet Take 17 g by mouth daily. 14 each 0   sertraline (ZOLOFT) 100 MG tablet Take 1 tablet (100 mg total) by mouth daily. 90 tablet 0   traMADol (ULTRAM) 50 MG tablet Take 1 tablet (50 mg total) by mouth every 8 (eight) hours as needed for severe pain. 30 tablet 0   No current facility-administered medications on file  prior to visit.    There are no Patient Instructions on file for this visit. No follow-ups on file.   Georgiana Spinner, NP

## 2021-03-30 DIAGNOSIS — H353132 Nonexudative age-related macular degeneration, bilateral, intermediate dry stage: Secondary | ICD-10-CM | POA: Diagnosis not present

## 2021-05-26 ENCOUNTER — Other Ambulatory Visit (INDEPENDENT_AMBULATORY_CARE_PROVIDER_SITE_OTHER): Payer: Self-pay | Admitting: Nurse Practitioner

## 2021-05-26 DIAGNOSIS — I739 Peripheral vascular disease, unspecified: Secondary | ICD-10-CM

## 2021-05-26 DIAGNOSIS — Z95820 Peripheral vascular angioplasty status with implants and grafts: Secondary | ICD-10-CM

## 2021-05-28 ENCOUNTER — Ambulatory Visit (INDEPENDENT_AMBULATORY_CARE_PROVIDER_SITE_OTHER): Payer: PPO

## 2021-05-28 ENCOUNTER — Ambulatory Visit (INDEPENDENT_AMBULATORY_CARE_PROVIDER_SITE_OTHER): Payer: PPO | Admitting: Nurse Practitioner

## 2021-05-28 ENCOUNTER — Other Ambulatory Visit: Payer: Self-pay

## 2021-05-28 ENCOUNTER — Encounter (INDEPENDENT_AMBULATORY_CARE_PROVIDER_SITE_OTHER): Payer: Self-pay | Admitting: Nurse Practitioner

## 2021-05-28 VITALS — BP 145/114 | HR 79 | Ht 66.0 in | Wt 183.0 lb

## 2021-05-28 DIAGNOSIS — I10 Essential (primary) hypertension: Secondary | ICD-10-CM

## 2021-05-28 DIAGNOSIS — Z95820 Peripheral vascular angioplasty status with implants and grafts: Secondary | ICD-10-CM

## 2021-05-28 DIAGNOSIS — I739 Peripheral vascular disease, unspecified: Secondary | ICD-10-CM | POA: Diagnosis not present

## 2021-05-28 DIAGNOSIS — E7849 Other hyperlipidemia: Secondary | ICD-10-CM | POA: Diagnosis not present

## 2021-05-28 MED ORDER — CLOPIDOGREL BISULFATE 75 MG PO TABS: 75.0000 mg | ORAL_TABLET | Freq: Every day | ORAL | 6 refills | Status: DC

## 2021-05-28 NOTE — Progress Notes (Signed)
Subjective:    Patient ID: Alison Russo, female    DOB: 11-27-1925, 87 y.o.   MRN: [id number] Chief Complaint  Patient presents with   Follow-up    3 Mo [place]    Alison Russo is a 87 year old female that returns to the office for followup and review of the noninvasive studies. There have been no interval changes in lower extremity symptoms. No interval shortening of the patient's claudication distance or development of rest pain symptoms. No new ulcers or wounds have occurred since the last visit.  There have been no significant changes to the patient's overall health care, however she does note that her primary care provider took her off of her aspirin due to a bruise on her hip.  The patient denies amaurosis fugax or recent TIA symptoms. There are no recent neurological changes noted. The patient denies history of DVT, PE or superficial thrombophlebitis. The patient denies recent episodes of angina or shortness of breath.   ABI Rt=1.17 and Lt=1.25  (previous ABI's Rt=1.17 and Lt=0.89) Duplex ultrasound of the right lower extremity reveals biphasic/triphasic waveforms with open patent stents.  Left lower extremity reveals triphasic waveforms in the distal anterior tibial artery however the posterior tibial artery is occluded.   Review of Systems  Hematological:  Bruises/bleeds easily.  All other systems reviewed and are negative.     Objective:   Physical Exam Vitals reviewed.  HENT:     Head: Normocephalic.  Cardiovascular:     Rate and Rhythm: Normal rate.     Pulses:          Dorsalis pedis pulses are 1+ on the right side and 1+ on the left side.       Posterior tibial pulses are 1+ on the right side.  Pulmonary:     Effort: Pulmonary effort is normal.  Musculoskeletal:     Right lower leg: 1+ Edema present.     Left lower leg: 1+ Edema present.  Skin:    General: Skin is warm and dry.     Capillary Refill: Capillary refill takes less than 2 seconds.   Neurological:     Mental Status: She is alert and oriented to person, place, and time.  Psychiatric:        Mood and Affect: Mood normal.        Behavior: Behavior normal.        Thought Content: Thought content normal.        Judgment: Judgment normal.    BP (!) 145/114   Pulse 79   Ht 5\' 6"  (1.676 m)   Wt 183 lb (83 kg)   BMI 29.54 kg/m   Past Medical History:  Diagnosis Date   Anginal pain (HCC)    CAD (coronary artery disease)    Heart murmur    Hypertension    Mood disorder (HCC)    mostly anxiety   NSTEMI (non-ST elevated myocardial infarction) (HCC) 12/22/2017   OSA on CPAP    PE (pulmonary embolism)    recurrent    Social History   Socioeconomic History   Marital status: Widowed    Spouse name: Not on file   Number of children: 2   Years of education: Not on file   Highest education level: Not on file  Occupational History   Occupation: Office work   Occupation: Custodian    Comment: 02/21/2018  Tobacco Use   Smoking status: Never   Smokeless tobacco: Never  [employer]  Vaping Use: Never used  Substance and Sexual Activity   Alcohol use: Never   Drug use: Never   Sexual activity: Not Currently  Other Topics Concern   Not on file  Social History Narrative   Has living will   Daughter is health care POA   Would accept resuscitation attempts   No tube feeds if cognitively unaware   Social Determinants of Health   Financial Resource Strain: Not on file  Food Insecurity: Not on file  Transportation Needs: Not on file  Physical Activity: Not on file  Stress: Not on file  Social Connections: Not on file  Intimate Partner Violence: Not on file    Past Surgical History:  Procedure Laterality Date   BREAST CYST ASPIRATION Right    neg   CARDIAC CATHETERIZATION     CATARACT EXTRACTION, BILATERAL     COLONOSCOPY     CORONARY STENT INTERVENTION N/A 12/23/2017   Procedure: CORONARY STENT INTERVENTION;  Surgeon: Swaziland, Peter M, MD;   Location: [facility];  Service: Cardiovascular;  Laterality: N/A;   EYE SURGERY Left    "blood vessel busted behind my eye"   EYE SURGERY Right    ?vitrectomy   HYSTEROSCOPY WITH D & C N/A 10/25/2016   Procedure: DILATATION AND CURETTAGE /HYSTEROSCOPY, POLYPECTOMY;  Surgeon: Christeen Douglas, MD;  Location: [facility];  Service: Gynecology;  Laterality: N/A;   INGUINAL HERNIA REPAIR Right 1950s   INTRAMEDULLARY (IM) NAIL INTERTROCHANTERIC Right 03/27/2020   Procedure: INTRAMEDULLARY (IM) NAIL INTERTROCHANTRIC;  Surgeon: Juanell Fairly, MD;  Location: [facility];  Service: Orthopedics;  Laterality: Right;   LEFT HEART CATH AND CORONARY ANGIOGRAPHY N/A 12/21/2017   Procedure: LEFT HEART CATH AND CORONARY ANGIOGRAPHY possible PCI and stent;  Surgeon: Alwyn Pea, MD;  Location: [facility];  Service: Cardiovascular;  Laterality: N/A;   LOWER EXTREMITY ANGIOGRAPHY Right 01/11/2018   Procedure: LOWER EXTREMITY ANGIOGRAPHY;  Surgeon: Annice Needy, MD;  Location: [facility];  Service: Cardiovascular;  Laterality: Right;   LOWER EXTREMITY ANGIOGRAPHY Right 02/12/2021   Procedure: Lower Extremity Angiography;  Surgeon: Renford Dills, MD;  Location: [facility];  Service: Cardiovascular;  Laterality: Right;   PSEUDOANERYSM COMPRESSION Left 01/20/2018   Procedure: PSEUDOANERYSM COMPRESSION;  Surgeon: Renford Dills, MD;  Location: [facility];  Service: Cardiovascular;  Laterality: Left;   TONSILLECTOMY      Family History  Problem Relation Age of Onset   Breast cancer Mother 75   AAA (abdominal aortic aneurysm) Father        ruptured   Coronary artery disease Brother     No Known Allergies  CBC Latest Ref Rng & Units 02/16/2021 02/15/2021 02/14/2021  WBC 4.0 - 10.5 K/uL 8.7 8.7 8.4  Hemoglobin 12.0 - 15.0 g/dL 10.9(L) 11.4(L) 12.0  Hematocrit 36.0 - 46.0 % 32.8(L) 34.3(L) 35.0(L)  Platelets 150 - 400 K/uL 124(L) 114(L) 121(L)      CMP      Component Value Date/Time   NA 139 02/16/2021 0421   NA 143 09/28/2017 1259   K 4.1 02/16/2021 0421   CL 108 02/16/2021 0421   CO2 25 02/16/2021 0421   GLUCOSE 118 (H) 02/16/2021 0421   BUN 20 02/16/2021 0421   BUN 16 09/28/2017 1259   CREATININE 0.68 02/16/2021 0421   CALCIUM 8.7 (L) 02/16/2021 0421   PROT 7.2 02/12/2021 1643   PROT 6.7 09/28/2017 1259   ALBUMIN 3.6 02/12/2021 1643  ALBUMIN 3.9 09/28/2017 1259   AST 36 02/12/2021 1643   ALT 23 02/12/2021 1643   ALKPHOS 75 02/12/2021 1643   BILITOT 1.2 02/12/2021 1643   BILITOT 0.3 09/28/2017 1259   GFRNONAA >60 02/16/2021 0421   GFRAA >60 05/23/2020 1820     No results found.     Assessment & Plan:   1. PVD (peripheral vascular disease) (HCC)  Recommend:  The patient has evidence of atherosclerosis of the lower extremities but has no issues with claudication.  The patient does not voice lifestyle limiting changes at this point in time.  Due to concern for the patient's history of bleeding issues we will stop her Eliquis.  Once she completes her Eliquis she is advised to transition to Plavix once a day.  Noninvasive studies do not suggest clinically significant change.  No invasive studies, angiography or surgery at this time The patient should continue walking and begin a more formal exercise program.  The patient should continue antiplatelet therapy and aggressive treatment of the lipid abnormalities   The patient should continue wearing graduated compression socks 10-15 mmHg strength to control the mild edema.   - clopidogrel (PLAVIX) 75 MG tablet; Take 1 tablet (75 mg total) by mouth daily.  Dispense: 30 tablet; Refill: 6 - VAS [place] ABI WITH/WO TBI; Future  2. Other hyperlipidemia Continue statin as ordered and reviewed, no changes at this time   3. Essential hypertension Continue antihypertensive medications as already ordered, these medications have been reviewed and there are no changes at this time.     Current Outpatient Medications on File Prior to Visit  Medication Sig Dispense Refill   acetaminophen (TYLENOL) 325 MG tablet Take 2 tablets (650 mg total) by mouth every 6 (six) hours as needed for mild pain (or Fever >/= 101). 30 tablet 2   Elastic Bandages & Supports (JOBST FOR MEN KNEE HIGH/LG) MISC Use compression stockings every day     feeding supplement, ENSURE ENLIVE, (ENSURE ENLIVE) LIQD Take 237 mLs by mouth 2 (two) times daily between meals. 237 mL 12   metoprolol tartrate (LOPRESSOR) 25 MG tablet Take 1 tablet (25 mg total) by mouth 2 (two) times daily. 60 tablet 0   Multiple Vitamins-Minerals (PRESERVISION AREDS 2+MULTI VIT PO) Take 1 tablet by mouth 2 (two) times daily.     Naphazoline-Pheniramine (OPCON-A OP) Place 1-2 drops into both eyes 3 (three) times daily as needed (for itchy/irritated eyes.).     polyethylene glycol (MIRALAX / GLYCOLAX) 17 g packet Take 17 g by mouth daily. 14 each 0   sertraline (ZOLOFT) 100 MG tablet Take 1 tablet (100 mg total) by mouth daily. 90 tablet 0   sertraline (ZOLOFT) 100 MG tablet Take 1 tablet by mouth daily.     traMADol (ULTRAM) 50 MG tablet Take 1 tablet (50 mg total) by mouth every 8 (eight) hours as needed for severe pain. 30 tablet 0   atorvastatin (LIPITOR) 20 MG tablet Take 4 tablets (80 mg total) by mouth daily. 120 tablet 2   No current facility-administered medications on file prior to visit.    There are no Patient Instructions on file for this visit. No follow-ups on file.   Georgiana Spinner, NP

## 2021-06-09 DIAGNOSIS — I480 Paroxysmal atrial fibrillation: Secondary | ICD-10-CM | POA: Diagnosis not present

## 2021-06-09 DIAGNOSIS — Z23 Encounter for immunization: Secondary | ICD-10-CM | POA: Diagnosis not present

## 2021-06-09 DIAGNOSIS — I739 Peripheral vascular disease, unspecified: Secondary | ICD-10-CM | POA: Diagnosis not present

## 2021-06-09 DIAGNOSIS — G4730 Sleep apnea, unspecified: Secondary | ICD-10-CM | POA: Diagnosis not present

## 2021-06-09 DIAGNOSIS — I2510 Atherosclerotic heart disease of native coronary artery without angina pectoris: Secondary | ICD-10-CM | POA: Diagnosis not present

## 2021-06-09 DIAGNOSIS — I10 Essential (primary) hypertension: Secondary | ICD-10-CM | POA: Diagnosis not present

## 2021-06-09 DIAGNOSIS — F32A Depression, unspecified: Secondary | ICD-10-CM | POA: Diagnosis not present

## 2021-06-09 DIAGNOSIS — K219 Gastro-esophageal reflux disease without esophagitis: Secondary | ICD-10-CM | POA: Diagnosis not present

## 2021-07-08 DIAGNOSIS — R0602 Shortness of breath: Secondary | ICD-10-CM | POA: Diagnosis not present

## 2021-07-08 DIAGNOSIS — R011 Cardiac murmur, unspecified: Secondary | ICD-10-CM | POA: Diagnosis not present

## 2021-07-08 DIAGNOSIS — Z6830 Body mass index (BMI) 30.0-30.9, adult: Secondary | ICD-10-CM | POA: Diagnosis not present

## 2021-07-08 DIAGNOSIS — Z955 Presence of coronary angioplasty implant and graft: Secondary | ICD-10-CM | POA: Diagnosis not present

## 2021-07-08 DIAGNOSIS — I209 Angina pectoris, unspecified: Secondary | ICD-10-CM | POA: Diagnosis not present

## 2021-07-08 DIAGNOSIS — E6609 Other obesity due to excess calories: Secondary | ICD-10-CM | POA: Diagnosis not present

## 2021-07-08 DIAGNOSIS — I480 Paroxysmal atrial fibrillation: Secondary | ICD-10-CM | POA: Diagnosis not present

## 2021-07-08 DIAGNOSIS — I10 Essential (primary) hypertension: Secondary | ICD-10-CM | POA: Diagnosis not present

## 2021-07-08 DIAGNOSIS — I214 Non-ST elevation (NSTEMI) myocardial infarction: Secondary | ICD-10-CM | POA: Diagnosis not present

## 2021-07-08 DIAGNOSIS — I2510 Atherosclerotic heart disease of native coronary artery without angina pectoris: Secondary | ICD-10-CM | POA: Diagnosis not present

## 2021-07-08 DIAGNOSIS — R000 Tachycardia, unspecified: Secondary | ICD-10-CM | POA: Diagnosis not present

## 2021-09-08 DIAGNOSIS — S39012A Strain of muscle, fascia and tendon of lower back, initial encounter: Secondary | ICD-10-CM | POA: Diagnosis not present

## 2021-09-28 DIAGNOSIS — M549 Dorsalgia, unspecified: Secondary | ICD-10-CM | POA: Diagnosis not present

## 2021-09-28 DIAGNOSIS — M545 Low back pain, unspecified: Secondary | ICD-10-CM | POA: Diagnosis not present

## 2021-11-16 DIAGNOSIS — I480 Paroxysmal atrial fibrillation: Secondary | ICD-10-CM | POA: Diagnosis not present

## 2021-11-16 DIAGNOSIS — I10 Essential (primary) hypertension: Secondary | ICD-10-CM | POA: Diagnosis not present

## 2021-11-16 DIAGNOSIS — R0602 Shortness of breath: Secondary | ICD-10-CM | POA: Diagnosis not present

## 2021-11-26 ENCOUNTER — Ambulatory Visit (INDEPENDENT_AMBULATORY_CARE_PROVIDER_SITE_OTHER): Payer: No Typology Code available for payment source | Admitting: Vascular Surgery

## 2021-11-26 ENCOUNTER — Encounter (INDEPENDENT_AMBULATORY_CARE_PROVIDER_SITE_OTHER): Payer: Self-pay | Admitting: Vascular Surgery

## 2021-11-26 ENCOUNTER — Other Ambulatory Visit: Payer: Self-pay

## 2021-11-26 ENCOUNTER — Ambulatory Visit (INDEPENDENT_AMBULATORY_CARE_PROVIDER_SITE_OTHER): Payer: No Typology Code available for payment source

## 2021-11-26 VITALS — BP 139/91 | HR 121 | Resp 18 | Ht 66.0 in | Wt 174.0 lb

## 2021-11-26 DIAGNOSIS — I10 Essential (primary) hypertension: Secondary | ICD-10-CM | POA: Diagnosis not present

## 2021-11-26 DIAGNOSIS — E7849 Other hyperlipidemia: Secondary | ICD-10-CM | POA: Diagnosis not present

## 2021-11-26 DIAGNOSIS — I25110 Atherosclerotic heart disease of native coronary artery with unstable angina pectoris: Secondary | ICD-10-CM | POA: Diagnosis not present

## 2021-11-26 DIAGNOSIS — I70213 Atherosclerosis of native arteries of extremities with intermittent claudication, bilateral legs: Secondary | ICD-10-CM

## 2021-11-26 DIAGNOSIS — I482 Chronic atrial fibrillation, unspecified: Secondary | ICD-10-CM | POA: Diagnosis not present

## 2021-11-26 DIAGNOSIS — I739 Peripheral vascular disease, unspecified: Secondary | ICD-10-CM | POA: Diagnosis not present

## 2021-11-26 NOTE — Progress Notes (Signed)
MRN : [id number]  Alison Russo is a 88 y.o. ([birth year]) female who presents with chief complaint of check legs.  History of Present Illness:   The patient is a 88 year old female that returns to the office for followup and review of the noninvasive studies.   Procedure 02/13/2022: Percutaneous transluminal angioplasty and stent placement right superficial femoral and popliteal arteries with Viabahn stents postdilated to 6 mm 2.  Percutaneous transluminal angioplasty right anterior tibial to 3 mm 3.  Mechanical thrombectomy of the right common femoral profunda femoris superficial femoral popliteal and tibioperoneal trunk with the penumbra CAT   There have been no interval changes in lower extremity symptoms. No interval shortening of the patient's claudication distance or development of rest pain symptoms. No new ulcers or wounds have occurred since the last visit.   There have been no significant changes to the patient's overall health care.   The patient denies amaurosis fugax or recent TIA symptoms. There are no recent neurological changes noted. The patient denies history of DVT, PE or superficial thrombophlebitis. The patient denies recent episodes of angina or shortness of breath.    ABI Rt=1.14 and Lt=1.13  (previous ABI's Rt=1.17 and Lt=1.25) Previous duplex ultrasound of the right lower extremity reveals biphasic/triphasic waveforms with open patent stents.  Left lower extremity reveals triphasic waveforms in the distal anterior tibial artery however the posterior tibial artery is occluded.  Current Meds  Medication Sig   acetaminophen (TYLENOL) 325 MG tablet Take 2 tablets (650 mg total) by mouth every 6 (six) hours as needed for mild pain (or Fever >/= 101).   clopidogrel (PLAVIX) 75 MG tablet Take 1 tablet (75 mg total) by mouth daily.   Elastic Bandages & Supports (JOBST FOR MEN KNEE HIGH/LG) MISC Use compression stockings every day   feeding supplement, ENSURE  ENLIVE, (ENSURE ENLIVE) LIQD Take 237 mLs by mouth 2 (two) times daily between meals.   metoprolol tartrate (LOPRESSOR) 25 MG tablet Take 1 tablet (25 mg total) by mouth 2 (two) times daily.   Multiple Vitamins-Minerals (PRESERVISION AREDS 2+MULTI VIT PO) Take 1 tablet by mouth 2 (two) times daily.   Naphazoline-Pheniramine (OPCON-A OP) Place 1-2 drops into both eyes 3 (three) times daily as needed (for itchy/irritated eyes.).   polyethylene glycol (MIRALAX / GLYCOLAX) 17 g packet Take 17 g by mouth daily.   sertraline (ZOLOFT) 100 MG tablet Take 1 tablet (100 mg total) by mouth daily.   sertraline (ZOLOFT) 100 MG tablet Take 1 tablet by mouth daily.   traMADol (ULTRAM) 50 MG tablet Take 1 tablet (50 mg total) by mouth every 8 (eight) hours as needed for severe pain.    Past Medical History:  Diagnosis Date   Anginal pain (HCC)    CAD (coronary artery disease)    Heart murmur    Hypertension    Mood disorder (HCC)    mostly anxiety   NSTEMI (non-ST elevated myocardial infarction) (HCC) 12/22/2017   OSA on CPAP    PE (pulmonary embolism)    recurrent    Past Surgical History:  Procedure Laterality Date   BREAST CYST ASPIRATION Right    neg   CARDIAC CATHETERIZATION     CATARACT EXTRACTION, BILATERAL     COLONOSCOPY     CORONARY STENT INTERVENTION N/A 12/23/2017   Procedure: CORONARY STENT INTERVENTION;  Surgeon: Swazil[place], Peter M, MD;  Location: [facility];  Service: Cardiovascular;  Laterality: N/A;   EYE SURGERY Left    "blood  vessel busted behind my eye"   EYE SURGERY Right    ?vitrectomy   HYSTEROSCOPY WITH D & C N/A 10/25/2016   Procedure: DILATATION AND CURETTAGE /HYSTEROSCOPY, POLYPECTOMY;  Surgeon: Benjaman Kindler, MD;  Location: [facility];  Service: Gynecology;  Laterality: N/A;   INGUINAL HERNIA REPAIR Right 1950s   INTRAMEDULLARY (IM) NAIL INTERTROCHANTERIC Right 03/27/2020   Procedure: INTRAMEDULLARY (IM) NAIL INTERTROCHANTRIC;  Surgeon: Thornton Park, MD;   Location: [facility];  Service: Orthopedics;  Laterality: Right;   LEFT HEART CATH AND CORONARY ANGIOGRAPHY N/A 12/21/2017   Procedure: LEFT HEART CATH AND CORONARY ANGIOGRAPHY possible PCI and stent;  Surgeon: Yolonda Kida, MD;  Location: [place] [facility];  Service: Cardiovascular;  Laterality: N/A;   LOWER EXTREMITY ANGIOGRAPHY Right 01/11/2018   Procedure: LOWER EXTREMITY ANGIOGRAPHY;  Surgeon: Algernon Huxley, MD;  Location: [place] [facility];  Service: Cardiovascular;  Laterality: Right;   LOWER EXTREMITY ANGIOGRAPHY Right 02/12/2021   Procedure: Lower Extremity Angiography;  Surgeon: Katha Cabal, MD;  Location: [place] [facility];  Service: Cardiovascular;  Laterality: Right;   PSEUDOANERYSM COMPRESSION Left 01/20/2018   Procedure: PSEUDOANERYSM COMPRESSION;  Surgeon: Katha Cabal, MD;  Location: [place] [facility];  Service: Cardiovascular;  Laterality: Left;   TONSILLECTOMY      Social History Social History   Tobacco Use   Smoking status: Never   Smokeless tobacco: Never  Vaping Use   Vaping Use: Never used  Substance Use Topics   Alcohol use: Never   Drug use: Never    Family History Family History  Problem Relation Age of Onset   Breast cancer Mother 69   AAA (abdominal aortic aneurysm) Father        ruptured   Coronary artery disease Brother     No Known Allergies   REVIEW OF SYSTEMS (Negative unless checked)  Constitutional: [] Weight loss  [] Fever  [] Chills Cardiac: [] Chest pain   [] Chest pressure   [] Palpitations   [] Shortness of breath when laying flat   [] Shortness of breath with exertion. Vascular:  [x] Pain in legs with walking   [] Pain in legs at rest  [] History of DVT   [] Phlebitis   [] Swelling in legs   [] Varicose veins   [] Non-healing ulcers Pulmonary:   [] Uses home oxygen   [] Productive cough   [] Hemoptysis   [] Wheeze  [] COPD   [] Asthma Neurologic:  [] Dizziness   [] Seizures   [] History of stroke   [] History of TIA  [] Aphasia    [] Vissual changes   [] Weakness or numbness in arm   [] Weakness or numbness in leg Musculoskeletal:   [] Joint swelling   [] Joint pain   [] Low back pain Hematologic:  [] Easy bruising  [] Easy bleeding   [] Hypercoagulable state   [] Anemic Gastrointestinal:  [] Diarrhea   [] Vomiting  [x] Gastroesophageal reflux/heartburn   [] Difficulty swallowing. Genitourinary:  [] Chronic kidney disease   [] Difficult urination  [] Frequent urination   [] Blood in urine Skin:  [] Rashes   [] Ulcers  Psychological:  [] History of anxiety   []  History of major depression.  Physical Examination  Vitals:   11/26/21 1031  BP: (!) 139/91  Pulse: (!) 121  Resp: 18  Weight: 174 lb (78.9 kg)  Height: 5\' 6"  (1.676 m)   Body mass index is 28.08 kg/m. Gen: WD/WN, NAD Head: [place]/AT, No temporalis wasting.  Ear/Nose/Throat: Hearing grossly intact, nares w/o erythema or drainage Eyes: PER, EOMI, sclera nonicteric.  Neck: Supple, no masses.  No bruit or JVD.  Pulmonary:  Good air  movement, no audible wheezing, no use of accessory muscles.  Cardiac: RRR, normal S1, S2, no Murmurs. Vascular:   Vessel Right Left  Radial Palpable Palpable  PT Not Palpable Not Palpable  DP Trace Palpable Trace Palpable  Gastrointestinal: soft, non-distended. No guarding/no peritoneal signs.  Musculoskeletal: M/S 5/5 throughout.  No visible deformity.  Neurologic: CN 2-12 intact. Pain and light touch intact in extremities.  Symmetrical.  Speech is fluent. Motor exam as listed above. Psychiatric: Judgment intact, Mood & affect appropriate for pt's clinical situation. Dermatologic: No rashes or ulcers noted.  No changes consistent with cellulitis.   CBC Lab Results  Component Value Date   WBC 8.7 02/16/2021   HGB 10.9 (L) 02/16/2021   HCT 32.8 (L) 02/16/2021   MCV 91.4 02/16/2021   PLT 124 (L) 02/16/2021    BMET    Component Value Date/Time   NA 139 02/16/2021 0421   NA 143 09/28/2017 1259   K 4.1 02/16/2021 0421   CL 108 02/16/2021  0421   CO2 25 02/16/2021 0421   GLUCOSE 118 (H) 02/16/2021 0421   BUN 20 02/16/2021 0421   BUN 16 09/28/2017 1259   CREATININE 0.68 02/16/2021 0421   CALCIUM 8.7 (L) 02/16/2021 0421   GFRNONAA >60 02/16/2021 0421   GFRAA >60 05/23/2020 1820   CrCl cannot be calculated (Patient's most recent lab result is older than the maximum 21 days allowed.).  COAG Lab Results  Component Value Date   INR 1.6 (H) 03/27/2020   INR 1.1 03/26/2020   INR 1.29 01/20/2018    Radiology No results found.   Assessment/Plan 1. Atherosclerosis of native artery of both lower extremities with intermittent claudication (HCC)  Recommend:  The patient has evidence of atherosclerosis of the lower extremities with claudication.  The patient does not voice lifestyle limiting changes at this point in time.  Noninvasive studies do not suggest clinically significant change.  No invasive studies, angiography or surgery at this time The patient should continue walking and begin a more formal exercise program.  The patient should continue antiplatelet therapy and aggressive treatment of the lipid abnormalities  No changes in the patient's medications at this time  - VAS [place] ABI WITH/WO TBI; Future  2. Chronic atrial fibrillation (HCC) Continue antiarrhythmia medications as already ordered, these medications have been reviewed and there are no changes at this time.  Continue anticoagulation as ordered by Cardiology Service   3. Coronary artery disease involving native coronary artery of native heart with unstable angina pectoris ([place]) Continue cardiac and antihypertensive medications as already ordered and reviewed, no changes at this time.  Continue statin as ordered and reviewed, no changes at this time  Nitrates PRN for chest pain   4. Essential hypertension Continue antihypertensive medications as already ordered, these medications have been reviewed and there are no changes at this time.   5. Other  hyperlipidemia Continue statin as ordered and reviewed, no changes at this time     Hortencia Pilar, MD  11/26/2021 10:59 AM

## 2021-11-27 ENCOUNTER — Encounter (INDEPENDENT_AMBULATORY_CARE_PROVIDER_SITE_OTHER): Payer: Self-pay | Admitting: Vascular Surgery

## 2021-11-27 DIAGNOSIS — I70219 Atherosclerosis of native arteries of extremities with intermittent claudication, unspecified extremity: Secondary | ICD-10-CM | POA: Insufficient documentation

## 2022-01-12 DIAGNOSIS — I10 Essential (primary) hypertension: Secondary | ICD-10-CM | POA: Diagnosis not present

## 2022-01-12 DIAGNOSIS — I480 Paroxysmal atrial fibrillation: Secondary | ICD-10-CM | POA: Diagnosis not present

## 2022-01-12 DIAGNOSIS — F32A Depression, unspecified: Secondary | ICD-10-CM | POA: Diagnosis not present

## 2022-01-12 DIAGNOSIS — R8290 Unspecified abnormal findings in urine: Secondary | ICD-10-CM | POA: Diagnosis not present

## 2022-01-12 DIAGNOSIS — Z0000 Encounter for general adult medical examination without abnormal findings: Secondary | ICD-10-CM | POA: Diagnosis not present

## 2022-01-12 DIAGNOSIS — I739 Peripheral vascular disease, unspecified: Secondary | ICD-10-CM | POA: Diagnosis not present

## 2022-01-12 DIAGNOSIS — Z0001 Encounter for general adult medical examination with abnormal findings: Secondary | ICD-10-CM | POA: Diagnosis not present

## 2022-01-12 DIAGNOSIS — I2510 Atherosclerotic heart disease of native coronary artery without angina pectoris: Secondary | ICD-10-CM | POA: Diagnosis not present

## 2022-01-12 DIAGNOSIS — G4730 Sleep apnea, unspecified: Secondary | ICD-10-CM | POA: Diagnosis not present

## 2022-02-10 ENCOUNTER — Other Ambulatory Visit (INDEPENDENT_AMBULATORY_CARE_PROVIDER_SITE_OTHER): Payer: Self-pay | Admitting: Nurse Practitioner

## 2022-02-10 DIAGNOSIS — I739 Peripheral vascular disease, unspecified: Secondary | ICD-10-CM

## 2022-03-25 DIAGNOSIS — E6609 Other obesity due to excess calories: Secondary | ICD-10-CM | POA: Diagnosis not present

## 2022-03-25 DIAGNOSIS — I2510 Atherosclerotic heart disease of native coronary artery without angina pectoris: Secondary | ICD-10-CM | POA: Diagnosis not present

## 2022-03-25 DIAGNOSIS — I209 Angina pectoris, unspecified: Secondary | ICD-10-CM | POA: Diagnosis not present

## 2022-03-25 DIAGNOSIS — Z6830 Body mass index (BMI) 30.0-30.9, adult: Secondary | ICD-10-CM | POA: Diagnosis not present

## 2022-03-25 DIAGNOSIS — I10 Essential (primary) hypertension: Secondary | ICD-10-CM | POA: Diagnosis not present

## 2022-03-25 DIAGNOSIS — I480 Paroxysmal atrial fibrillation: Secondary | ICD-10-CM | POA: Diagnosis not present

## 2022-03-25 DIAGNOSIS — R000 Tachycardia, unspecified: Secondary | ICD-10-CM | POA: Diagnosis not present

## 2022-03-25 DIAGNOSIS — R011 Cardiac murmur, unspecified: Secondary | ICD-10-CM | POA: Diagnosis not present

## 2022-03-25 DIAGNOSIS — R0602 Shortness of breath: Secondary | ICD-10-CM | POA: Diagnosis not present

## 2022-03-25 DIAGNOSIS — I214 Non-ST elevation (NSTEMI) myocardial infarction: Secondary | ICD-10-CM | POA: Diagnosis not present

## 2022-03-25 DIAGNOSIS — Z955 Presence of coronary angioplasty implant and graft: Secondary | ICD-10-CM | POA: Diagnosis not present

## 2022-03-30 ENCOUNTER — Other Ambulatory Visit (INDEPENDENT_AMBULATORY_CARE_PROVIDER_SITE_OTHER): Payer: Self-pay | Admitting: Nurse Practitioner

## 2022-03-30 DIAGNOSIS — I739 Peripheral vascular disease, unspecified: Secondary | ICD-10-CM

## 2022-04-01 ENCOUNTER — Other Ambulatory Visit (INDEPENDENT_AMBULATORY_CARE_PROVIDER_SITE_OTHER): Payer: Self-pay

## 2022-04-01 DIAGNOSIS — I739 Peripheral vascular disease, unspecified: Secondary | ICD-10-CM

## 2022-04-01 MED ORDER — CLOPIDOGREL BISULFATE 75 MG PO TABS: 75.0000 mg | ORAL_TABLET | Freq: Every day | ORAL | 1 refills | Status: AC

## 2022-05-02 IMAGING — CT CT ANGIO AOBIFEM WO/W CM
2 of 10 series · 11 of 46 positions shown, 14 images · IV contrast (APPLIED)
Comparison: None.

CLINICAL DATA: Right leg pain and cramping

EXAM:
CT ANGIOGRAPHY OF ABDOMINAL AORTA WITH ILIOFEMORAL RUNOFF
TECHNIQUE: Multidetector CT imaging of the abdomen, pelvis and lower
extremities was performed using the standard protocol during bolus
administration of intravenous contrast. Multiplanar CT image
reconstructions and MIPs were obtained to evaluate the vascular
anatomy.
CONTRAST:  100mL OMNIPAQUE IOHEXOL 350 MG/ML SOLN

[Series 5: axial arterial upper · axial · arterial · 0.98mm/px · z∈[+527,+1235]mm · 10 of 287 slices shown, 13 images]
[im 34/287  soft-tissue]
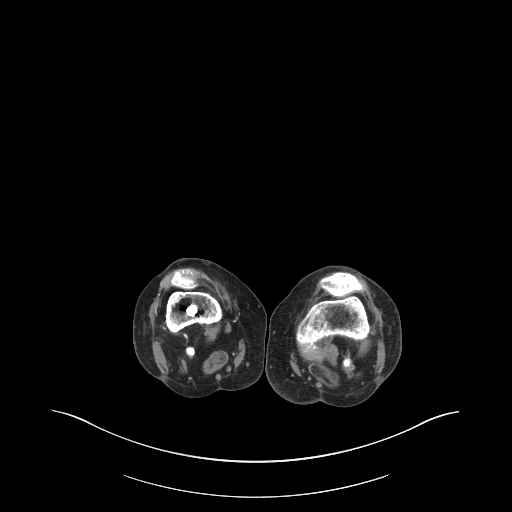
[im 34/287  bone]
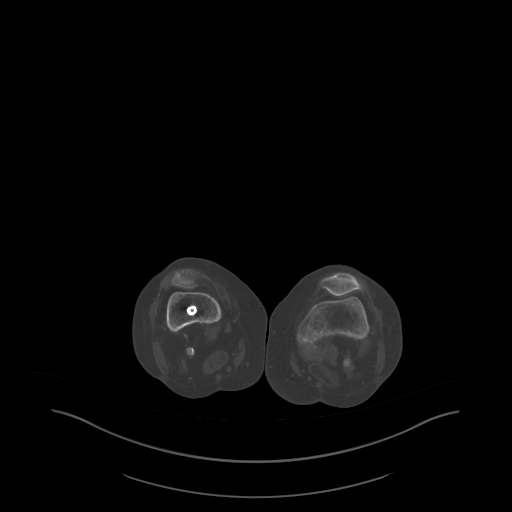
[im 68/287  soft-tissue]
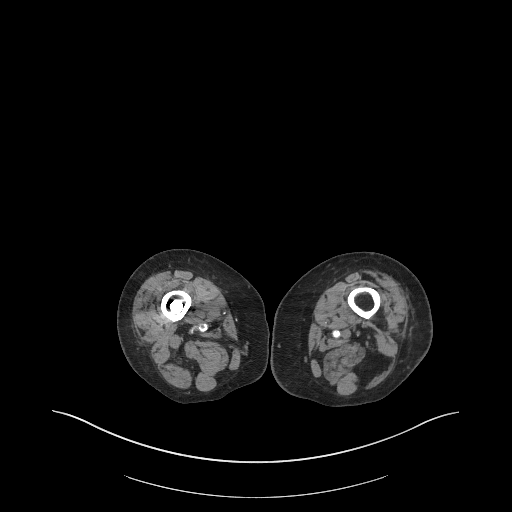
[im 101/287  soft-tissue]
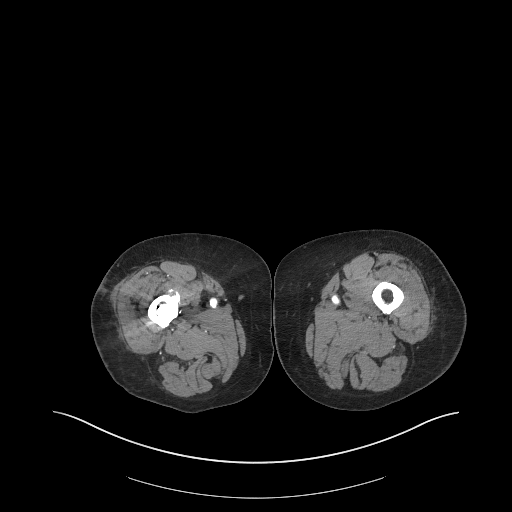
[im 135/287  soft-tissue]
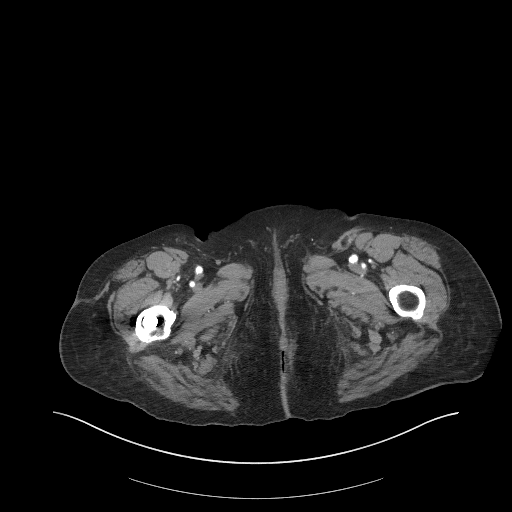
[im 169/287  soft-tissue]
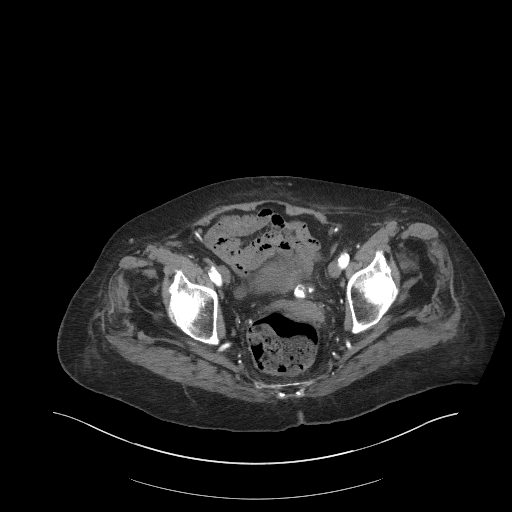
[im 202/287  soft-tissue]
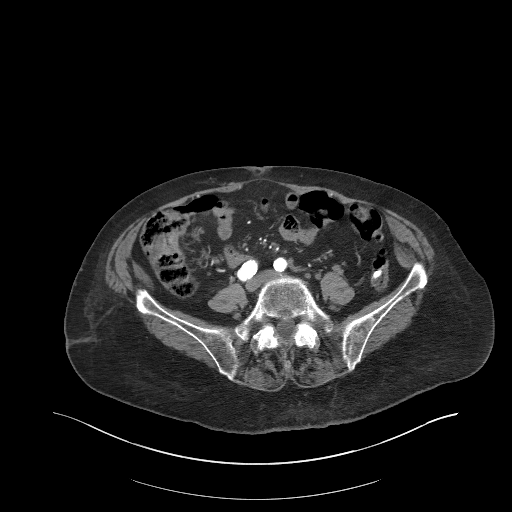
[im 219/287  lung]
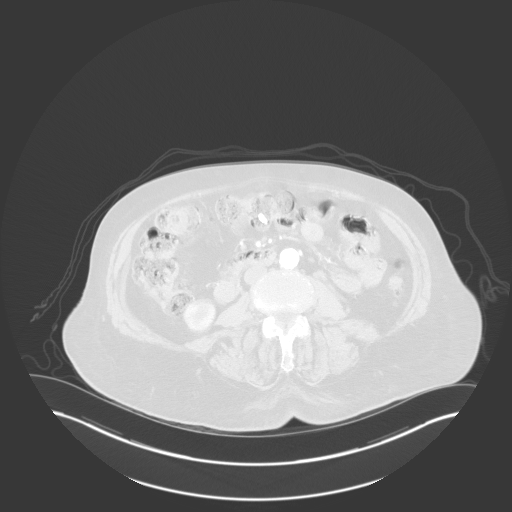
[im 236/287  soft-tissue]
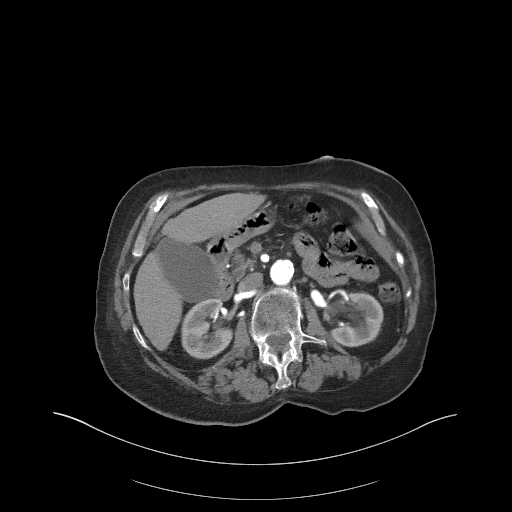
[im 236/287  lung]
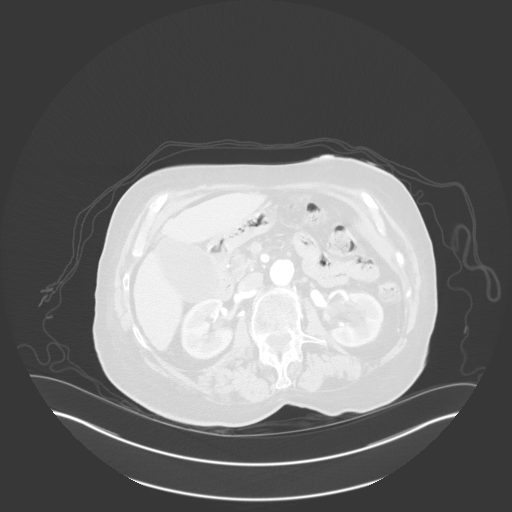
[im 253/287  lung]
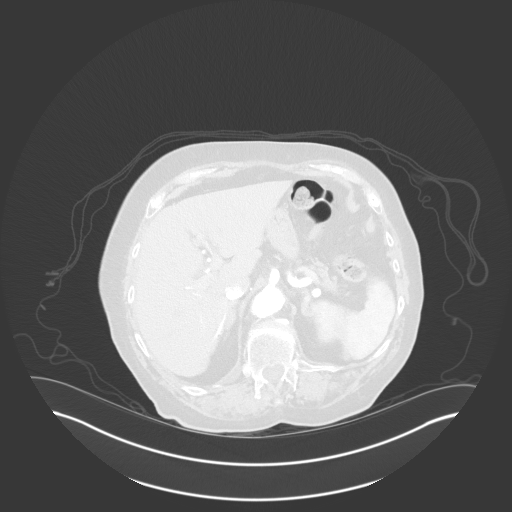
[im 270/287  soft-tissue]
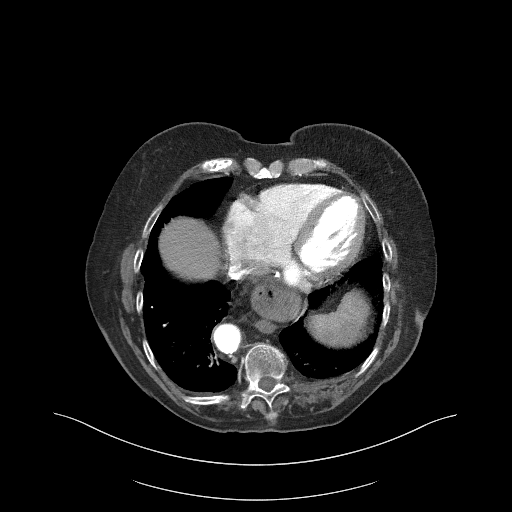
[im 270/287  lung]
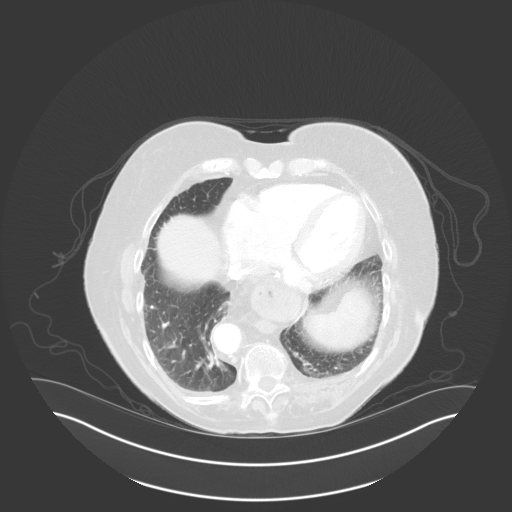

[Series 7: coronal upper · coronal · 0.86mm/px · 1 of 158 slices shown]
[im 79/158  soft-tissue]
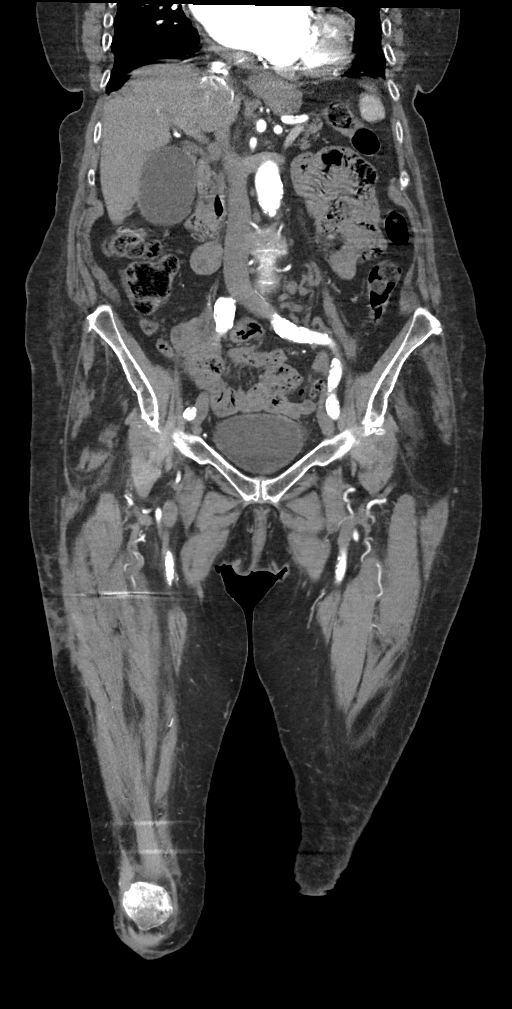

[11 of 46 positions shown; findings below may reference images not displayed]

FINDINGS: VASCULAR

Aorta: The visualized distal thoracic aorta is of normal caliber.
The abdominal aorta is of normal caliber. No evidence of aneurysm or
dissection. Extensive atherosclerotic calcification without evidence
of hemodynamically significant stenosis. No significant ulcerated or
soft plaque identified. No periaortic inflammatory change.

Celiac: High-grade (50-75%) stenosis of the celiac axis at its
origin. Distally widely patent without evidence of aneurysm or
dissection. Normal anatomic configuration.

SMA: Mild atherosclerotic calcification within the proximal segment.
Widely patent. No aneurysm or dissection.

Renals: Less than 50% stenosis of the right renal artery at its
origin. Single renal arteries bilaterally. Normal vascular
morphology. No aneurysm.

IMA: Less than 50% stenosis at its origin.  Distally widely patent.

RIGHT Lower Extremity

Inflow: Extensive atherosclerotic calcification. No significant soft
or ulcerated plaque. No hemodynamically significant stenosis.
Internal iliac artery is patent at its origin. No aneurysm or
dissection.

Outflow: There is a a branching intraluminal filling defect within
the right common femoral artery extending into the proximal
profundus and superficial femoral artery in keeping with an arterial
embolus. This results in segmental near occlusion of the common
femoral artery. The profundus femoral artery, distally, is abruptly
occluded beyond the a second muscular branch related to distal
embolization and is subsequently reconstituted. The superficial
femoral artery is segmentally occluded at the level of the adductor
hiatus secondary to distal embolization with the embolus extending
through the P1 segment of the popliteal artery.

Runoff: There is, again, distal embolization with occlusion of the
P3 segment of the a popliteal artery with subsequent reconstitution
of the a three-vessel runoff. The anterior tibial artery terminates
at the level of the distal tibial diaphysis. The posterior tibial
artery demonstrates occlusion at the level of the tibial plafond
and. The peroneal artery terminates at the level of the distal
tibial diaphysis

LEFT Lower Extremity

Inflow: Extensive atherosclerotic calcification. No significant soft
or ulcerated plaque. No aneurysm or dissection. No evidence of
hemodynamically significant stenosis. Internal iliac arteries patent
proximally.

Outflow: Scattered atherosclerotic plaque throughout the superficial
femoral artery without hemodynamically significant stenosis. Lower
extremity arterial outflow is otherwise widely patent.

Runoff: Popliteal artery is widely patent. There is, however,
embolic occlusion of the tibioperoneal trunk beyond the takeoff of
the anterior tibial artery in keeping with distal embolization.
Similarly, there is abrupt occlusion of the anterior tibial artery
proximally secondary to embolic disease. Relatively weak distal
reconstitution without opacification of the dorsalis pedis artery
and tibioperoneal trunk.

Veins: No obvious venous abnormality within the limitations of this
arterial phase study.

Review of the MIP images confirms the above findings.

NON-VASCULAR

Lower chest: Cardiac size is mildly enlarged. Visualized lung bases
are clear. Moderate hiatal hernia.

Hepatobiliary: Liver contour is nodular in keeping with changes of
cirrhosis. Cholelithiasis without pericholecystic inflammatory
change identified.

Pancreas: 18 mm cystic lesion of the identified within the mid body
of the pancreas, indeterminate. This may represent a simple
pancreatic cyst, side branch ectasia, pseudocyst, or a cystic
neoplasm. This is slightly enlarged from prior examination of
01/19/2018 (previously measuring 16 mm)

Spleen: Unremarkable

Adrenals/Urinary Tract: The adrenal glands are unremarkable. Mild
cortical scarring within the right kidney. The kidneys are otherwise
unremarkable. The bladder demonstrates multiple small diverticula
suggesting changes of a bladder outlet obstruction. The bladder is
not distended.

Stomach/Bowel: Severe sigmoid diverticulosis. Moderate stool
throughout the colon. The stomach, small bowel, and large bowel are
otherwise unremarkable. No evidence of obstruction or focal
inflammation. The appendix is not visualized and is likely absent.

Lymphatic: No pathologic adenopathy within the abdomen and pelvis.

Reproductive: Involuted calcified fibroid within the uterus. The
pelvic organs are otherwise unremarkable.

Other: Small fat containing left inguinal hernia.

Musculoskeletal: Right hip ORIF has been performed utilizing a gamma
nail device. Osseous structures are diffusely osteopenic. No acute
bone abnormality. No lytic or blastic bone lesion.
IMPRESSION: VASCULAR

Evidence of embolic disease with multiple levels of occlusion within
the right lower extremity arterial outflow and runoff.

On the right, embolus is noted within the common femoral artery a
branching into the profundus femoral artery and superficial femoral
artery proximally. Subsequent occlusion of the mid profundus femoral
artery. Distal embolization with occlusion of the a terminal
superficial femoral artery and P1 segment of the popliteal artery.
Distal embolization with occlusion of the P3 segment of the
popliteal artery. Three-vessel runoff is identified, but terminates
at the level of the a distal tibia and is unclear whether this is
related to poor antegrade flow or small particulate embolization.

On the left, abrupt occlusion of the a anterior tibial artery
proximally and tibioperoneal trunk secondary to distal embolization.
Poor reconstitution of the distal vasculature with nonvisualization
of the dorsalis pedis artery and plantar arch.

High-grade (50-75% stenosis) of the celiac axis at its origin.

No identified embolic source within the abdominal aorta and lower
extremity arterial inflow.

NON-VASCULAR

Mild cardiomegaly.

Moderate hiatal hernia.

Morphologic changes in keeping with cirrhosis.

18 mm cystic lesion within the mid body of the pancreas,
indeterminate. This demonstrates slight interval growth since prior
examination. Differential considerations are as listed above.

Sigmoid diverticulosis.

Aortic Atherosclerosis ([id number]-[id number].E).

These results were called by telephone at the time of interpretation
on 02/12/2021 at [DATE] to provider ANDRZEJ GIRGIS , who verbally
acknowledged these results.

## 2022-05-03 ENCOUNTER — Emergency Department: Payer: No Typology Code available for payment source

## 2022-05-03 ENCOUNTER — Other Ambulatory Visit: Payer: Self-pay

## 2022-05-03 ENCOUNTER — Inpatient Hospital Stay
Admission: EM | Admit: 2022-05-03 | Discharge: 2022-05-06 | DRG: 177 | Disposition: A | Discharge status: Home / Self Care | Payer: No Typology Code available for payment source | Source: Ambulatory Visit | Attending: Internal Medicine | Admitting: Internal Medicine

## 2022-05-03 DIAGNOSIS — I4891 Unspecified atrial fibrillation: Secondary | ICD-10-CM | POA: Diagnosis not present

## 2022-05-03 DIAGNOSIS — U071 COVID-19: Principal | ICD-10-CM | POA: Diagnosis present

## 2022-05-03 DIAGNOSIS — J9601 Acute respiratory failure with hypoxia: Secondary | ICD-10-CM | POA: Diagnosis present

## 2022-05-03 DIAGNOSIS — Z8249 Family history of ischemic heart disease and other diseases of the circulatory system: Secondary | ICD-10-CM

## 2022-05-03 DIAGNOSIS — Z7902 Long term (current) use of antithrombotics/antiplatelets: Secondary | ICD-10-CM

## 2022-05-03 DIAGNOSIS — E669 Obesity, unspecified: Secondary | ICD-10-CM | POA: Diagnosis present

## 2022-05-03 DIAGNOSIS — Z955 Presence of coronary angioplasty implant and graft: Secondary | ICD-10-CM

## 2022-05-03 DIAGNOSIS — Z86711 Personal history of pulmonary embolism: Secondary | ICD-10-CM

## 2022-05-03 DIAGNOSIS — I10 Essential (primary) hypertension: Secondary | ICD-10-CM | POA: Diagnosis present

## 2022-05-03 DIAGNOSIS — R54 Age-related physical debility: Secondary | ICD-10-CM | POA: Diagnosis present

## 2022-05-03 DIAGNOSIS — I70201 Unspecified atherosclerosis of native arteries of extremities, right leg: Secondary | ICD-10-CM | POA: Diagnosis present

## 2022-05-03 DIAGNOSIS — I2510 Atherosclerotic heart disease of native coronary artery without angina pectoris: Secondary | ICD-10-CM | POA: Diagnosis present

## 2022-05-03 DIAGNOSIS — E785 Hyperlipidemia, unspecified: Secondary | ICD-10-CM | POA: Diagnosis present

## 2022-05-03 DIAGNOSIS — R918 Other nonspecific abnormal finding of lung field: Secondary | ICD-10-CM | POA: Diagnosis not present

## 2022-05-03 DIAGNOSIS — J1282 Pneumonia due to coronavirus disease 2019: Secondary | ICD-10-CM | POA: Diagnosis present

## 2022-05-03 DIAGNOSIS — I252 Old myocardial infarction: Secondary | ICD-10-CM

## 2022-05-03 DIAGNOSIS — G4733 Obstructive sleep apnea (adult) (pediatric): Secondary | ICD-10-CM | POA: Diagnosis present

## 2022-05-03 DIAGNOSIS — R8290 Unspecified abnormal findings in urine: Secondary | ICD-10-CM | POA: Diagnosis not present

## 2022-05-03 DIAGNOSIS — F32A Depression, unspecified: Secondary | ICD-10-CM

## 2022-05-03 DIAGNOSIS — Z803 Family history of malignant neoplasm of breast: Secondary | ICD-10-CM

## 2022-05-03 DIAGNOSIS — K219 Gastro-esophageal reflux disease without esophagitis: Secondary | ICD-10-CM | POA: Diagnosis present

## 2022-05-03 DIAGNOSIS — D696 Thrombocytopenia, unspecified: Secondary | ICD-10-CM | POA: Diagnosis present

## 2022-05-03 DIAGNOSIS — G4730 Sleep apnea, unspecified: Secondary | ICD-10-CM | POA: Diagnosis present

## 2022-05-03 DIAGNOSIS — Z79899 Other long term (current) drug therapy: Secondary | ICD-10-CM

## 2022-05-03 DIAGNOSIS — Z6829 Body mass index (BMI) 29.0-29.9, adult: Secondary | ICD-10-CM

## 2022-05-03 DIAGNOSIS — F419 Anxiety disorder, unspecified: Secondary | ICD-10-CM | POA: Diagnosis present

## 2022-05-03 DIAGNOSIS — R000 Tachycardia, unspecified: Secondary | ICD-10-CM | POA: Diagnosis present

## 2022-05-03 DIAGNOSIS — L723 Sebaceous cyst: Secondary | ICD-10-CM

## 2022-05-03 DIAGNOSIS — R0902 Hypoxemia: Secondary | ICD-10-CM | POA: Diagnosis present

## 2022-05-03 DIAGNOSIS — R0602 Shortness of breath: Secondary | ICD-10-CM | POA: Diagnosis not present

## 2022-05-03 DIAGNOSIS — Z95820 Peripheral vascular angioplasty status with implants and grafts: Secondary | ICD-10-CM

## 2022-05-03 LAB — CBC WITH DIFFERENTIAL/PLATELET
Abs Immature Granulocytes: 0.06 10*3/uL (ref 0.00–0.07)
Basophils Absolute: 0 10*3/uL (ref 0.0–0.1)
Basophils Relative: 0 %
Eosinophils Absolute: 0 10*3/uL (ref 0.0–0.5)
Eosinophils Relative: 0 %
HCT: 44.7 % (ref 36.0–46.0)
Hemoglobin: 14.5 g/dL (ref 12.0–15.0)
Immature Granulocytes: 1 %
Lymphocytes Relative: 3 %
Lymphs Abs: 0.2 10*3/uL — ABNORMAL LOW (ref 0.7–4.0)
MCH: 29.9 pg (ref 26.0–34.0)
MCHC: 32.4 g/dL (ref 30.0–36.0)
MCV: 92.2 fL (ref 80.0–100.0)
Monocytes Absolute: 0.6 10*3/uL (ref 0.1–1.0)
Monocytes Relative: 7 %
Neutro Abs: 7.1 10*3/uL (ref 1.7–7.7)
Neutrophils Relative %: 89 %
Platelets: 111 10*3/uL — ABNORMAL LOW (ref 150–400)
RBC: 4.85 MIL/uL (ref 3.87–5.11)
RDW: 13.2 % (ref 11.5–15.5)
WBC: 8 10*3/uL (ref 4.0–10.5)
nRBC: 0 % (ref 0.0–0.2)

## 2022-05-03 LAB — RESP PANEL BY RT-PCR (FLU A&B, COVID) ARPGX2
Influenza A by PCR: NEGATIVE
Influenza B by PCR: NEGATIVE
SARS Coronavirus 2 by RT PCR: POSITIVE — AB

## 2022-05-03 LAB — TROPONIN I (HIGH SENSITIVITY)
Troponin I (High Sensitivity): 8 ng/L (ref <18)
Troponin I (High Sensitivity): 11 ng/L (ref <18)
Troponin I (High Sensitivity): 10 ng/L (ref <18)

## 2022-05-03 LAB — COMPREHENSIVE METABOLIC PANEL
ALT: 14 U/L (ref 0–44)
AST: 25 U/L (ref 15–41)
Albumin: 4 g/dL (ref 3.5–5.0)
Alkaline Phosphatase: 67 U/L (ref 38–126)
Anion gap: 9 (ref 5–15)
BUN: 20 mg/dL (ref 8–23)
CO2: 23 mmol/L (ref 22–32)
Calcium: 9.5 mg/dL (ref 8.9–10.3)
Chloride: 105 mmol/L (ref 98–111)
Creatinine, Ser: 0.82 mg/dL (ref 0.44–1.00)
GFR, Estimated: >60 mL/min (ref >60)
Glucose, Bld: 142 mg/dL — ABNORMAL HIGH (ref 70–99)
Potassium: 4 mmol/L (ref 3.5–5.1)
Sodium: 137 mmol/L (ref 135–145)
Total Bilirubin: 1.1 mg/dL (ref 0.3–1.2)
Total Protein: 7.7 g/dL (ref 6.5–8.1)

## 2022-05-03 LAB — CBC
HCT: 36.9 % (ref 36.0–46.0)
Hemoglobin: 11.8 g/dL — ABNORMAL LOW (ref 12.0–15.0)
MCH: 29.9 pg (ref 26.0–34.0)
MCHC: 32 g/dL (ref 30.0–36.0)
MCV: 93.4 fL (ref 80.0–100.0)
Platelets: 108 10*3/uL — ABNORMAL LOW (ref 150–400)
RBC: 3.95 MIL/uL (ref 3.87–5.11)
RDW: 13.2 % (ref 11.5–15.5)
WBC: 6 10*3/uL (ref 4.0–10.5)
nRBC: 0 % (ref 0.0–0.2)

## 2022-05-03 LAB — APTT: aPTT: 23 seconds — ABNORMAL LOW (ref 24–36)

## 2022-05-03 LAB — BRAIN NATRIURETIC PEPTIDE: B Natriuretic Peptide: 186.4 pg/mL — ABNORMAL HIGH (ref 0.0–100.0)

## 2022-05-03 LAB — PROTIME-INR
INR: 1.1 (ref 0.8–1.2)
Prothrombin Time: 14 seconds (ref 11.4–15.2)

## 2022-05-03 LAB — PROCALCITONIN
Procalcitonin: <0.1 ng/mL
Procalcitonin: <0.1 ng/mL

## 2022-05-03 LAB — LACTIC ACID, PLASMA
Lactic Acid, Venous: 1.4 mmol/L (ref 0.5–1.9)
Lactic Acid, Venous: 0.9 mmol/L (ref 0.5–1.9)

## 2022-05-03 LAB — D-DIMER, QUANTITATIVE: D-Dimer, Quant: 1.76 ug/mL-FEU — ABNORMAL HIGH (ref 0.00–0.50)

## 2022-05-03 LAB — CREATININE, SERUM
Creatinine, Ser: 0.7 mg/dL (ref 0.44–1.00)
GFR, Estimated: >60 mL/min (ref >60)

## 2022-05-03 LAB — FIBRINOGEN: Fibrinogen: 343 mg/dL (ref 210–475)

## 2022-05-03 LAB — LACTATE DEHYDROGENASE: LDH: 128 U/L (ref 98–192)

## 2022-05-03 LAB — FERRITIN: Ferritin: 116 ng/mL (ref 11–307)

## 2022-05-03 MED ORDER — HEPARIN SODIUM (PORCINE) 5000 UNIT/ML IJ SOLN
5000.0000 [IU] | Freq: Three times a day (TID) | INTRAMUSCULAR | Status: DC
  Administered 2022-05-03 – 2022-05-06 (×8): 5000 [IU] via SUBCUTANEOUS
  Filled 2022-05-03 (×8): qty 1

## 2022-05-03 MED ORDER — ONDANSETRON HCL 4 MG/2ML IJ SOLN
4.0000 mg | Freq: Four times a day (QID) | INTRAMUSCULAR | Status: DC | PRN
  Administered 2022-05-05: 4 mg via INTRAVENOUS
  Filled 2022-05-03: qty 2

## 2022-05-03 MED ORDER — METOPROLOL TARTRATE 25 MG PO TABS
25.0000 mg | ORAL_TABLET | Freq: Once | ORAL | Status: AC
  Administered 2022-05-03: 25 mg via ORAL
  Filled 2022-05-03: qty 1

## 2022-05-03 MED ORDER — HYDROCOD POLI-CHLORPHE POLI ER 10-8 MG/5ML PO SUER: 5.0000 mL | Freq: Two times a day (BID) | ORAL | Status: DC | PRN

## 2022-05-03 MED ORDER — SODIUM CHLORIDE 0.9 % IV BOLUS
500.0000 mL | Freq: Once | INTRAVENOUS | Status: AC
  Administered 2022-05-03: 500 mL via INTRAVENOUS

## 2022-05-03 MED ORDER — ACETAMINOPHEN 500 MG PO TABS
1000.0000 mg | ORAL_TABLET | Freq: Once | ORAL | Status: AC
  Administered 2022-05-03: 1000 mg via ORAL
  Filled 2022-05-03: qty 2

## 2022-05-03 MED ORDER — NIRMATRELVIR/RITONAVIR (PAXLOVID)TABLET
3.0000 | ORAL_TABLET | Freq: Two times a day (BID) | ORAL | Status: DC
  Administered 2022-05-03 – 2022-05-06 (×6): 3 via ORAL
  Filled 2022-05-03 (×2): qty 30

## 2022-05-03 MED ORDER — ONDANSETRON HCL 4 MG/2ML IJ SOLN
4.0000 mg | Freq: Once | INTRAMUSCULAR | Status: AC
  Administered 2022-05-03: 4 mg via INTRAVENOUS
  Filled 2022-05-03: qty 2

## 2022-05-03 MED ORDER — IPRATROPIUM BROMIDE HFA 17 MCG/ACT IN AERS: 2.0000 | INHALATION_SPRAY | Freq: Four times a day (QID) | RESPIRATORY_TRACT | Status: DC | PRN

## 2022-05-03 MED ORDER — ONDANSETRON HCL 4 MG PO TABS: 4.0000 mg | ORAL_TABLET | Freq: Four times a day (QID) | ORAL | Status: DC | PRN

## 2022-05-03 MED ORDER — ACETAMINOPHEN 325 MG PO TABS
650.0000 mg | ORAL_TABLET | Freq: Four times a day (QID) | ORAL | Status: DC | PRN
Start: 2022-05-03 — End: 2022-05-06
  Administered 2022-05-05 – 2022-05-06 (×2): 650 mg via ORAL
  Filled 2022-05-03 (×2): qty 2

## 2022-05-03 MED ORDER — NIRMATRELVIR/RITONAVIR (PAXLOVID)TABLET: 3.0000 | ORAL_TABLET | Freq: Two times a day (BID) | ORAL | Status: DC

## 2022-05-03 MED ORDER — GUAIFENESIN-DM 100-10 MG/5ML PO SYRP: 10.0000 mL | ORAL_SOLUTION | ORAL | Status: DC | PRN

## 2022-05-03 NOTE — Plan of Care (Signed)
  Problem: Education: Goal: Knowledge of risk factors and measures for prevention of condition will improve Outcome: Progressing   Problem: Coping: Goal: Psychosocial and spiritual needs will be supported Outcome: Progressing   Problem: Respiratory: Goal: Will maintain a patent airway Outcome: Progressing Goal: Complications related to the disease process, condition or treatment will be avoided or minimized Outcome: Progressing   

## 2022-05-03 NOTE — H&P (Incomplete)
History and Physical    TAMU GOLZ UEA:[id number] DOB: 11/18/1925 DOA: 05/03/2022  PCP: Pcp, No  Patient coming from: HOME  I have personally briefly reviewed patient's old medical records in [facility]  Chief Complaint: CHILLS COUGH X 1 DAY  HPI: Alison Russo is a 88 y.o. female with medical history significant of AFIB intolerant to full dose AC on plavix,CAD s/p NSTEMI s/p LAD stent, Obesity Class 1,depression, OSA on CPAP, IDA,HLD,GERD, Hx of recurrent PE , PVD s/p RLE angioplasty and stent placement,who present to Ed with chills/cough /fever/n as well as sob that began over the last 24 hours.    ED Course:  On evaluation in ED patient found to be COVID Positive Vitals: temp 100, BP 140/90, hr 103,  rr 17 sat 92%-94% on ra   Cxr 1. Mild right basilar opacity, likely due to atelectasis, infection or aspiration are additional considerations. 2. Age indeterminate severe compression deformity of the approximate L1 vertebral body. Correlate for point tenderness.  Labs: NA 137, K 4,  glu 142, cr 0.82 Lactic 1.4 Cbc:wbc 8, hgb 14.5,  Plt 111 EKG: Atrial tachycardia Tx tylenol 1 gram, plaxovid, ns bolus 500 cc  Patient ad Review of Systems: As per HPI otherwise 10 point review of systems negative.   Past Medical History:  Diagnosis Date   Anginal pain (HCC)    CAD (coronary artery disease)    Heart murmur    Hypertension    Mood disorder (HCC)    mostly anxiety   NSTEMI (non-ST elevated myocardial infarction) (HCC) 12/22/2017   OSA on CPAP    PE (pulmonary embolism)    recurrent    Past Surgical History:  Procedure Laterality Date   BREAST CYST ASPIRATION Right    neg   CARDIAC CATHETERIZATION     CATARACT EXTRACTION, BILATERAL     COLONOSCOPY     CORONARY STENT INTERVENTION N/A 12/23/2017   Procedure: CORONARY STENT INTERVENTION;  Surgeon: Swazil[place], Peter M, MD;  Location: [facility];  Service: Cardiovascular;  Laterality: N/A;   EYE SURGERY  Left    "blood vessel busted behind my eye"   EYE SURGERY Right    ?vitrectomy   HYSTEROSCOPY WITH D & C N/A 10/25/2016   Procedure: DILATATION AND CURETTAGE /HYSTEROSCOPY, POLYPECTOMY;  Surgeon: Christeen Douglas, MD;  Location: [facility] ORS;  Service: Gynecology;  Laterality: N/A;   INGUINAL HERNIA REPAIR Right 1950s   INTRAMEDULLARY (IM) NAIL INTERTROCHANTERIC Right 03/27/2020   Procedure: INTRAMEDULLARY (IM) NAIL INTERTROCHANTRIC;  Surgeon: Juanell Fairly, MD;  Location: [facility] ORS;  Service: Orthopedics;  Laterality: Right;   LEFT HEART CATH AND CORONARY ANGIOGRAPHY N/A 12/21/2017   Procedure: LEFT HEART CATH AND CORONARY ANGIOGRAPHY possible PCI and stent;  Surgeon: Alwyn Pea, MD;  Location: [facility] INVASIVE CV LAB;  Service: Cardiovascular;  Laterality: N/A;   LOWER EXTREMITY ANGIOGRAPHY Right 01/11/2018   Procedure: LOWER EXTREMITY ANGIOGRAPHY;  Surgeon: Annice Needy, MD;  Location: [facility] INV[facility] CV [facility];  Service: Cardiovascular;  Laterality: Right;   LOWER EXTREMITY ANGIOGRAPHY Right 02/12/2021   Procedure: Lower Extremity Angiography;  Surgeon: Renford Dills, MD;  Location: [facility];  Service: Cardiovascular;  Laterality: Right;   PSEUDOANERYSM COMPRESSION Left 01/20/2018   Procedure: PSEUDOANERYSM COMPRESSION;  Surgeon: Renford Dills, MD;  Location: [facility] INV[facility]IVE CV [facility];  Service: Cardiovascular;  Laterality: Left;   TONSILLECTOMY       reports that she has never smoked. She has never used smokeless tobacco.  She reports that she does not drink alcohol and does not use drugs.  No Known Allergies  Family History  Problem Relation Age of Onset   Breast cancer Mother 33   AAA (abdominal aortic aneurysm) Father        ruptured   Coronary artery disease Brother     Prior to Admission medications   Medication Sig Start Date End Date Taking? Authorizing Provider  acetaminophen (TYLENOL) 325 MG tablet Take 2 tablets (650 mg total) by mouth every 6 (six) hours as  needed for mild pain (or Fever >/= 101). 01/23/18   Enid Baas, MD  atorvastatin (LIPITOR) 20 MG tablet Take 4 tablets (80 mg total) by mouth daily. 02/17/21 05/18/21  Osvaldo Shipper, MD  clopidogrel (PLAVIX) 75 MG tablet Take 1 tablet (75 mg total) by mouth daily. 04/01/22 09/28/22  Schnier, Latina Craver, MD  Elastic Bandages & Supports (JOBST FOR MEN KNEE HIGH/LG) MISC Use compression stockings every day 05/20/20   [provider]  feeding supplement, ENSURE ENLIVE, (ENSURE ENLIVE) LIQD Take 237 mLs by mouth 2 (two) times daily between meals. 01/24/18   Enid Baas, MD  metoprolol tartrate (LOPRESSOR) 25 MG tablet Take 1 tablet (25 mg total) by mouth 2 (two) times daily. 04/01/20   Enedina Finner, MD  Multiple Vitamins-Minerals (PRESERVISION AREDS 2+MULTI VIT PO) Take 1 tablet by mouth 2 (two) times daily.    [provider]  Naphazoline-Pheniramine (OPCON-A OP) Place 1-2 drops into both eyes 3 (three) times daily as needed (for itchy/irritated eyes.).    [provider]  polyethylene glycol (MIRALAX / GLYCOLAX) 17 g packet Take 17 g by mouth daily. 02/17/21   Osvaldo Shipper, MD  sertraline (ZOLOFT) 100 MG tablet Take 1 tablet (100 mg total) by mouth daily. 03/20/18   Carlean Jews, NP  sertraline (ZOLOFT) 100 MG tablet Take 1 tablet by mouth daily. 05/05/21   [provider]  traMADol (ULTRAM) 50 MG tablet Take 1 tablet (50 mg total) by mouth every 8 (eight) hours as needed for severe pain. 02/16/21   Osvaldo Shipper, MD    Physical Exam: Vitals:   05/03/22 1316 05/03/22 1736 05/03/22 1820 05/03/22 1830  BP:  (!) 140/90 (!) 148/102 123/89  Pulse:  (!) 103  (!) 119  Resp:  17  (!) 31  Temp:  100 F (37.8 C)    TempSrc: Oral Oral    SpO2:  92%  94%  Weight:      Height:        Constitutional: NAD, calm, comfortable Vitals:   05/03/22 1316 05/03/22 1736 05/03/22 1820 05/03/22 1830  BP:  (!) 140/90 (!) 148/102 123/89  Pulse:  (!) 103  (!) 119  Resp:   17  (!) 31  Temp:  100 F (37.8 C)    TempSrc: Oral Oral    SpO2:  92%  94%  Weight:      Height:       Eyes: PERRL, lids and conjunctivae normal ENMT: Mucous membranes are moist. Posterior pharynx clear of any exudate or lesions.Normal dentition.  Neck: normal, supple, no masses, no thyromegaly Respiratory: clear to auscultation bilaterally, no wheezing, no crackles. Normal respiratory effort. No accessory muscle use.  Cardiovascular: Regular rate and rhythm, no murmurs / rubs / gallops. No extremity edema. 2+ pedal pulses. No carotid bruits.  Abdomen: no tenderness, no masses palpated. No hepatosplenomegaly. Bowel sounds positive.  Musculoskeletal: no clubbing / cyanosis. No joint deformity upper and lower extremities. Good ROM,  no contractures. Normal muscle tone.  Skin: no rashes, lesions, ulcers. No induration Neurologic: CN 2-12 grossly intact. Sensation intact, DTR normal. Strength 5/5 in all 4.  Psychiatric: Normal judgment and insight. Alert and oriented x 3. Normal mood.    Labs on Admission: I have personally reviewed following labs and imaging studies  CBC: Recent Labs  Lab 05/03/22 1354  WBC 8.0  NEUTROABS 7.1  HGB 14.5  HCT 44.7  MCV 92.2  PLT 111*   Basic Metabolic Panel: Recent Labs  Lab 05/03/22 1354  NA 137  K 4.0  CL 105  CO2 23  GLUCOSE 142*  BUN 20  CREATININE 0.82  CALCIUM 9.5   GFR: Estimated Creatinine Clearance: 45.2 mL/min (by C-G formula based on SCr of 0.82 mg/dL). Liver Function Tests: Recent Labs  Lab 05/03/22 1354  AST 25  ALT 14  ALKPHOS 67  BILITOT 1.1  PROT 7.7  ALBUMIN 4.0   No results for input(s): "LIPASE", "AMYLASE" in the last 168 hours. No results for input(s): "AMMONIA" in the last 168 hours. Coagulation Profile: Recent Labs  Lab 05/03/22 1354  INR 1.1   Cardiac Enzymes: No results for input(s): "CKTOTAL", "CKMB", "CKMBINDEX", "TROPONINI" in the last 168 hours. BNP (last 3 results) No results for input(s):  "PROBNP" in the last 8760 hours. HbA1C: No results for input(s): "HGBA1C" in the last 72 hours. CBG: No results for input(s): "GLUCAP" in the last 168 hours. Lipid Profile: No results for input(s): "CHOL", "HDL", "LDLCALC", "TRIG", "CHOLHDL", "LDLDIRECT" in the last 72 hours. Thyroid Function Tests: No results for input(s): "TSH", "T4TOTAL", "FREET4", "T3FREE", "THYROIDAB" in the last 72 hours. Anemia Panel: No results for input(s): "VITAMINB12", "FOLATE", "FERRITIN", "TIBC", "IRON", "RETICCTPCT" in the last 72 hours. Urine analysis:    Component Value Date/Time   COLORURINE AMBER (A) 05/23/2020 1819   APPEARANCEUR HAZY (A) 05/23/2020 1819   LABSPEC 1.019 05/23/2020 1819   PHURINE 5.0 05/23/2020 1819   GLUCOSEU NEGATIVE 05/23/2020 1819   HGBUR NEGATIVE 05/23/2020 1819   BILIRUBINUR NEGATIVE 05/23/2020 1819   KETONESUR NEGATIVE 05/23/2020 1819   PROTEINUR NEGATIVE 05/23/2020 1819   NITRITE POSITIVE (A) 05/23/2020 1819   LEUKOCYTESUR NEGATIVE 05/23/2020 1819    Radiological Exams on Admission: DG Chest 2 View  Result Date: 05/03/2022 CLINICAL DATA:  Questionable sepsis EXAM: CHEST - 2 VIEW COMPARISON:  Chest x-ray dated March 28, 2020 FINDINGS: Unchanged cardiomegaly and tortuosity of the thoracic aorta. Mild right basilar opacity. No pleural effusion or pneumothorax. Age indeterminate severe compression deformity of the approximate L1 vertebral body. IMPRESSION: 1. Mild right basilar opacity, likely due to atelectasis, infection or aspiration are additional considerations. 2. Age indeterminate severe compression deformity of the approximate L1 vertebral body. Correlate for point tenderness. Electronically Signed   By: Allegra Lai M.D.   On: 05/03/2022 15:00    EKG: Independently reviewed. ***  Assessment/Plan COVID viral Pneumonia without associated hypoxic respiratory failure  -Plaxovid x 5 days due to note risk factors  -no steroids as patient w/o hypoxemia  -encourage po  fluid intake  -monitor on continuous pulse ox  -pulmonary toilet -supportive care with antiemetics tylenol,mucolytics -daily monitoring of disease activity labs  -d-dimer pending if elevated will order  , CTPA  -dvt ppx per protocol     AFIB -resume home regimen  -continue on plavix as patient is  intolerant to full dose   CAD s/p NSTEMI s/p LAD stent -resume GDMT  -statin,metoprolol,plavix  Obesity Class 1  Depression -continue ssri  OSA - on CPAP qhs     IDA -h/h stable   HLD -continue statin   GERD -ppi    Hx of recurrent PE -f/u on d-dimer  -of note not on full dose AC due to intolerance     PVD - s/p RLE angioplasty and stent placement  Mild thrombocytopenia -monitor counts   DVT prophylaxis: Heparin ,monitor plt  Code Status: Full Family Communication:  Plan: patient  expected to be admitted less than 2 midnights  Consults called: n/a Admission status: med tele   Lurline Del MD [facility]   If 7PM-7AM, please contact night-coverage [email or web address] Password [facility]  05/03/2022, 7:06 PM

## 2022-05-03 NOTE — ED Provider Notes (Signed)
[facility] Provider Note    Event Date/Time   First MD Initiated Contact with Patient 05/03/22 1457     (approximate)   History   Shortness of Breath   HPI  Alison Russo is a 88 y.o. female with A-fib, hypertension, on Plavix secondary to acute ischemia of the right lower leg who comes in from [facility] clinic due to several days of coughing headache and nausea symptoms that started yesterday as long as some shortness of breath and weakness.  Patient was noted to be 94% and tachycardiac with a fever.  They report that she lives alone and she was having difficulty even just standing up in the bed.  They required 2 people assist.  They deny any falls or hitting her head  Pt is not on eliquis due to note tolerating due to hematomas.      Physical Exam   Triage Vital Signs: ED Triage Vitals  Enc Vitals Group     BP 05/03/22 1309 (!) 134/101     Pulse Rate 05/03/22 1309 (!) 113     Resp 05/03/22 1309 15     Temp 05/03/22 1309 (!) 100.8 F (38.2 C)     Temp Source 05/03/22 1309 Oral     SpO2 05/03/22 1309 93 %     Weight 05/03/22 1309 180 lb (81.6 kg)     Height 05/03/22 1309 5\' 6"  (1.676 m)     Head Circumference --      Peak Flow --      Pain Score 05/03/22 1357 0     Pain Loc --      Pain Edu? --      Excl. in GC? --     Most recent vital signs: Vitals:   05/03/22 1309  BP: (!) 134/101  Pulse: (!) 113  Resp: 15  Temp: (!) 100.8 F (38.2 C)  SpO2: 93%     General: Awake, no distress.  CV:  Good peripheral perfusion.  Resp:  Normal effort.  Abd:  No distention.  Soft nontender Other:  No leg swelling.  No calf tenderness   ED Results / Procedures / Treatments   Labs (all labs ordered are listed, but only abnormal results are displayed) Labs Reviewed  COMPREHENSIVE METABOLIC PANEL - Abnormal; Notable for the following components:      Result Value   Glucose, Bld 142 (*)    All other components within normal limits  CBC WITH  DIFFERENTIAL/PLATELET - Abnormal; Notable for the following components:   Platelets 111 (*)    Lymphs Abs 0.2 (*)    All other components within normal limits  APTT - Abnormal; Notable for the following components:   aPTT 23 (*)    All other components within normal limits  CULTURE, BLOOD (ROUTINE X 2)  CULTURE, BLOOD (ROUTINE X 2)  URINE CULTURE  RESP PANEL BY RT-PCR (FLU A&B, COVID) ARPGX2  LACTIC ACID, PLASMA  PROTIME-INR  LACTIC ACID, PLASMA  URINALYSIS, COMPLETE (UACMP) WITH MICROSCOPIC  BRAIN NATRIURETIC PEPTIDE  TROPONIN I (HIGH SENSITIVITY)     EKG  My interpretation of EKG:  Atrial fibrillation rate of 112 without any ST elevation or T wave inversions, normal intervals  RADIOLOGY I have reviewed the xray personally and interpreted and patient has some mild right basilar opacities    IMPRESSION: 1. Mild right basilar opacity, likely due to atelectasis, infection or aspiration are additional considerations. 2. Age indeterminate severe compression deformity of the approximate L1  vertebral body. Correlate for point tenderness.    PROCEDURES:  Critical Care performed: No  .1-3 Lead EKG Interpretation  Performed by: Concha Se, MD Authorized by: Concha Se, MD     Interpretation: abnormal     ECG rate:  110   ECG rate assessment: tachycardic     Rhythm: atrial fibrillation     Ectopy: none     Conduction: normal      MEDICATIONS ORDERED IN ED: Medications  nirmatrelvir/ritonavir EUA (PAXLOVID) 3 tablet (has no administration in time range)  metoprolol tartrate (LOPRESSOR) tablet 25 mg (has no administration in time range)  ondansetron (ZOFRAN) injection 4 mg (4 mg Intravenous Given 05/03/22 1411)  acetaminophen (TYLENOL) tablet 1,000 mg (1,000 mg Oral Given 05/03/22 1741)  sodium chloride 0.9 % bolus 500 mL (500 mLs Intravenous New Bag/Given 05/03/22 1730)     IMPRESSION / MDM / ASSESSMENT AND PLAN / ED COURSE  I reviewed the triage vital signs  and the nursing notes.   Patient's presentation is most consistent with acute presentation with potential threat to life or bodily function.   Differential includes pneumonia, COVID, sepsis, bacteremia, UTI.  We will hold off on antibiotics at this time given I suspect this could just be viral COVID still waiting on swab.  We will give some Tylenol and gentle fluid.  We will add on procalcitonin to evaluate for bacterial infection.  Lactate is normal.  CMP normal. Cbc normal. Trop negative.  Procalcitonin negative.  Patient's COVID test is positive have held off on antibiotics due to my concern that this is all secondary to viral illness given negative procalcitonin.  Her heart rates are ranging between 90 and 110 we will give her her home metoprolol dose.  I have lower suspicion for PE she is got no evidence of DVT on examination her troponins are negative x2.  However her oxygen levels are around 92% she is has a lot of weakness therefore given she lives by herself I have discussed with family about admission for treatment in the hospital.  We will start patient on Paxlovid.   The patient is on the cardiac monitor to evaluate for evidence of arrhythmia and/or significant heart rate changes.      FINAL CLINICAL IMPRESSION(S) / ED DIAGNOSES   Final diagnoses:  Atrial fibrillation, unspecified type (HCC)  COVID-19     Rx / DC Orders   ED Discharge Orders     None        Note:  This document was prepared using Dragon voice recognition software and may include unintentional dictation errors.   Concha Se, MD 05/03/22 [phone]

## 2022-05-03 NOTE — ED Provider Triage Note (Signed)
Emergency Medicine Provider Triage Evaluation Note  Alison Russo , a 88 y.o. female  was evaluated in triage.  Pt complains of weakness, cough and congestion, some shortness of breath, nausea.  Review of Systems  Positive: See above Negative: Swelling in legs  Physical Exam  BP (!) 134/101 (BP Location: Right Arm)   Pulse (!) 113   Temp (!) 100.8 F (38.2 C) (Oral)   Resp 15   Ht 5\' 6"  (1.676 m)   Wt 81.6 kg   SpO2 93%   BMI 29.05 kg/m  Gen:   Awake, no distress   Resp:  Normal effort  MSK:   Moves extremities without difficulty  Other:    Medical Decision Making  Medically screening exam initiated at 1:50 PM.  Appropriate orders placed.  was informed that the remainder of the evaluation will be completed by another provider, this initial triage assessment does not replace that evaluation, and the importance of remaining in the ED until their evaluation is complete.  Patient's temp and heart rate are elevated, will start sepsis protocols   [name], PA-C 05/03/22 1351

## 2022-05-03 NOTE — ED Triage Notes (Addendum)
Pt to ED via POV from walk in clinic. Pt started coughing and having chills yesterday. Pt reports nausea, SOB and fever today. Pt on blood thinner. Pt with hx PE, HTN, CAD.   PA in triage and initiated sepsis protocol

## 2022-05-04 ENCOUNTER — Inpatient Hospital Stay: Payer: No Typology Code available for payment source

## 2022-05-04 DIAGNOSIS — R0902 Hypoxemia: Secondary | ICD-10-CM | POA: Diagnosis not present

## 2022-05-04 DIAGNOSIS — E785 Hyperlipidemia, unspecified: Secondary | ICD-10-CM | POA: Diagnosis not present

## 2022-05-04 DIAGNOSIS — I2510 Atherosclerotic heart disease of native coronary artery without angina pectoris: Secondary | ICD-10-CM | POA: Diagnosis present

## 2022-05-04 DIAGNOSIS — R0602 Shortness of breath: Secondary | ICD-10-CM | POA: Diagnosis not present

## 2022-05-04 DIAGNOSIS — F419 Anxiety disorder, unspecified: Secondary | ICD-10-CM | POA: Diagnosis not present

## 2022-05-04 DIAGNOSIS — I4891 Unspecified atrial fibrillation: Secondary | ICD-10-CM | POA: Diagnosis not present

## 2022-05-04 DIAGNOSIS — Z86711 Personal history of pulmonary embolism: Secondary | ICD-10-CM | POA: Diagnosis not present

## 2022-05-04 DIAGNOSIS — Z7902 Long term (current) use of antithrombotics/antiplatelets: Secondary | ICD-10-CM | POA: Diagnosis not present

## 2022-05-04 DIAGNOSIS — Z8249 Family history of ischemic heart disease and other diseases of the circulatory system: Secondary | ICD-10-CM | POA: Diagnosis not present

## 2022-05-04 DIAGNOSIS — R54 Age-related physical debility: Secondary | ICD-10-CM | POA: Diagnosis not present

## 2022-05-04 DIAGNOSIS — E669 Obesity, unspecified: Secondary | ICD-10-CM | POA: Diagnosis not present

## 2022-05-04 DIAGNOSIS — Z6829 Body mass index (BMI) 29.0-29.9, adult: Secondary | ICD-10-CM | POA: Diagnosis not present

## 2022-05-04 DIAGNOSIS — U071 COVID-19: Secondary | ICD-10-CM | POA: Diagnosis not present

## 2022-05-04 DIAGNOSIS — K219 Gastro-esophageal reflux disease without esophagitis: Secondary | ICD-10-CM | POA: Diagnosis not present

## 2022-05-04 DIAGNOSIS — Z95820 Peripheral vascular angioplasty status with implants and grafts: Secondary | ICD-10-CM | POA: Diagnosis not present

## 2022-05-04 DIAGNOSIS — J1282 Pneumonia due to coronavirus disease 2019: Secondary | ICD-10-CM | POA: Diagnosis not present

## 2022-05-04 DIAGNOSIS — J9601 Acute respiratory failure with hypoxia: Secondary | ICD-10-CM | POA: Diagnosis not present

## 2022-05-04 DIAGNOSIS — J90 Pleural effusion, not elsewhere classified: Secondary | ICD-10-CM | POA: Diagnosis not present

## 2022-05-04 DIAGNOSIS — J9811 Atelectasis: Secondary | ICD-10-CM | POA: Diagnosis not present

## 2022-05-04 DIAGNOSIS — Z803 Family history of malignant neoplasm of breast: Secondary | ICD-10-CM | POA: Diagnosis not present

## 2022-05-04 DIAGNOSIS — F32A Depression, unspecified: Secondary | ICD-10-CM | POA: Diagnosis not present

## 2022-05-04 DIAGNOSIS — D696 Thrombocytopenia, unspecified: Secondary | ICD-10-CM | POA: Diagnosis not present

## 2022-05-04 DIAGNOSIS — I252 Old myocardial infarction: Secondary | ICD-10-CM | POA: Diagnosis not present

## 2022-05-04 DIAGNOSIS — I70201 Unspecified atherosclerosis of native arteries of extremities, right leg: Secondary | ICD-10-CM | POA: Diagnosis not present

## 2022-05-04 DIAGNOSIS — L723 Sebaceous cyst: Secondary | ICD-10-CM | POA: Diagnosis not present

## 2022-05-04 DIAGNOSIS — I10 Essential (primary) hypertension: Secondary | ICD-10-CM | POA: Diagnosis not present

## 2022-05-04 DIAGNOSIS — G4733 Obstructive sleep apnea (adult) (pediatric): Secondary | ICD-10-CM | POA: Diagnosis not present

## 2022-05-04 DIAGNOSIS — I7121 Aneurysm of the ascending aorta, without rupture: Secondary | ICD-10-CM | POA: Diagnosis not present

## 2022-05-04 DIAGNOSIS — R000 Tachycardia, unspecified: Secondary | ICD-10-CM | POA: Diagnosis present

## 2022-05-04 LAB — CBC WITH DIFFERENTIAL/PLATELET
Abs Immature Granulocytes: 0.02 10*3/uL (ref 0.00–0.07)
Basophils Absolute: 0 10*3/uL (ref 0.0–0.1)
Basophils Relative: 1 %
Eosinophils Absolute: 0 10*3/uL (ref 0.0–0.5)
Eosinophils Relative: 0 %
HCT: 39.2 % (ref 36.0–46.0)
Hemoglobin: 12.6 g/dL (ref 12.0–15.0)
Immature Granulocytes: 0 %
Lymphocytes Relative: 11 %
Lymphs Abs: 0.6 10*3/uL — ABNORMAL LOW (ref 0.7–4.0)
MCH: 29.3 pg (ref 26.0–34.0)
MCHC: 32.1 g/dL (ref 30.0–36.0)
MCV: 91.2 fL (ref 80.0–100.0)
Monocytes Absolute: 1 10*3/uL (ref 0.1–1.0)
Monocytes Relative: 16 %
Neutro Abs: 4.2 10*3/uL (ref 1.7–7.7)
Neutrophils Relative %: 72 %
Platelets: 86 10*3/uL — ABNORMAL LOW (ref 150–400)
RBC: 4.3 MIL/uL (ref 3.87–5.11)
RDW: 13.3 % (ref 11.5–15.5)
WBC: 5.9 10*3/uL (ref 4.0–10.5)
nRBC: 0 % (ref 0.0–0.2)

## 2022-05-04 LAB — C-REACTIVE PROTEIN
CRP: 2.5 mg/dL — ABNORMAL HIGH (ref <1.0)
CRP: 3.4 mg/dL — ABNORMAL HIGH (ref <1.0)

## 2022-05-04 LAB — COMPREHENSIVE METABOLIC PANEL
ALT: 11 U/L (ref 0–44)
AST: 23 U/L (ref 15–41)
Albumin: 3.3 g/dL — ABNORMAL LOW (ref 3.5–5.0)
Alkaline Phosphatase: 51 U/L (ref 38–126)
Anion gap: 4 — ABNORMAL LOW (ref 5–15)
BUN: 18 mg/dL (ref 8–23)
CO2: 24 mmol/L (ref 22–32)
Calcium: 8.8 mg/dL — ABNORMAL LOW (ref 8.9–10.3)
Chloride: 111 mmol/L (ref 98–111)
Creatinine, Ser: 0.62 mg/dL (ref 0.44–1.00)
GFR, Estimated: >60 mL/min (ref >60)
Glucose, Bld: 100 mg/dL — ABNORMAL HIGH (ref 70–99)
Potassium: 3.8 mmol/L (ref 3.5–5.1)
Sodium: 139 mmol/L (ref 135–145)
Total Bilirubin: 1.1 mg/dL (ref 0.3–1.2)
Total Protein: 6.3 g/dL — ABNORMAL LOW (ref 6.5–8.1)

## 2022-05-04 LAB — PHOSPHORUS: Phosphorus: 3.5 mg/dL (ref 2.5–4.6)

## 2022-05-04 LAB — PROCALCITONIN: Procalcitonin: <0.1 ng/mL

## 2022-05-04 LAB — MAGNESIUM: Magnesium: 2.2 mg/dL (ref 1.7–2.4)

## 2022-05-04 LAB — D-DIMER, QUANTITATIVE: D-Dimer, Quant: 1.88 ug/mL-FEU — ABNORMAL HIGH (ref 0.00–0.50)

## 2022-05-04 LAB — FERRITIN: Ferritin: 158 ng/mL (ref 11–307)

## 2022-05-04 MED ORDER — POLYETHYLENE GLYCOL 3350 17 G PO PACK
17.0000 g | PACK | Freq: Every day | ORAL | Status: DC
  Administered 2022-05-06: 17 g via ORAL
  Filled 2022-05-04 (×2): qty 1

## 2022-05-04 MED ORDER — ATORVASTATIN CALCIUM 20 MG PO TABS: 80.0000 mg | ORAL_TABLET | Freq: Every day | ORAL | Status: DC

## 2022-05-04 MED ORDER — ENSURE ENLIVE PO LIQD
237.0000 mL | Freq: Three times a day (TID) | ORAL | Status: DC
  Administered 2022-05-04 – 2022-05-06 (×4): 237 mL via ORAL

## 2022-05-04 MED ORDER — ADULT MULTIVITAMIN W/MINERALS CH
1.0000 | ORAL_TABLET | Freq: Every day | ORAL | Status: DC
  Administered 2022-05-05 – 2022-05-06 (×2): 1 via ORAL
  Filled 2022-05-04 (×2): qty 1

## 2022-05-04 MED ORDER — CLOPIDOGREL BISULFATE 75 MG PO TABS
75.0000 mg | ORAL_TABLET | Freq: Every day | ORAL | Status: DC
  Administered 2022-05-04 – 2022-05-06 (×3): 75 mg via ORAL
  Filled 2022-05-04 (×3): qty 1

## 2022-05-04 MED ORDER — IOHEXOL 350 MG/ML SOLN
75.0000 mL | Freq: Once | INTRAVENOUS | Status: AC | PRN
  Administered 2022-05-04: 75 mL via INTRAVENOUS

## 2022-05-04 MED ORDER — DEXAMETHASONE SODIUM PHOSPHATE 10 MG/ML IJ SOLN
8.0000 mg | INTRAMUSCULAR | Status: DC
  Administered 2022-05-04 – 2022-05-05 (×2): 8 mg via INTRAVENOUS
  Filled 2022-05-04 (×2): qty 1

## 2022-05-04 MED ORDER — SERTRALINE HCL 50 MG PO TABS
100.0000 mg | ORAL_TABLET | Freq: Every day | ORAL | Status: DC
  Administered 2022-05-04 – 2022-05-06 (×3): 100 mg via ORAL
  Filled 2022-05-04 (×4): qty 2

## 2022-05-04 MED ORDER — ACETAMINOPHEN 325 MG PO TABS: 650.0000 mg | ORAL_TABLET | Freq: Four times a day (QID) | ORAL | Status: DC | PRN

## 2022-05-04 MED ORDER — METOPROLOL TARTRATE 25 MG PO TABS
25.0000 mg | ORAL_TABLET | Freq: Two times a day (BID) | ORAL | Status: DC
  Administered 2022-05-04 – 2022-05-06 (×4): 25 mg via ORAL
  Filled 2022-05-04 (×4): qty 1

## 2022-05-04 NOTE — Assessment & Plan Note (Addendum)
Resolved Due to covid, possible PE  O2 supplemental --> RA  Continuous pulse ox --> off to pulse ox w/ VS  Telemetry --> D/c   CTA chest --> no PE

## 2022-05-04 NOTE — Hospital Course (Addendum)
Alison Russo is a 88 y.o. female with medical history significant of AFIB intolerant to full dose AC on plavix,CAD s/p NSTEMI s/p LAD stent, Obesity Class 1,depression, OSA on CPAP, IDA,HLD,GERD, Hx of recurrent PE , PVD s/p RLE angioplasty and stent placement,who present to ED 05/03/2022 with chills/cough/fever/sob that began over the last 24 hours.  08/14: (+)COVID, temp 100, BP 140/90, hr 103,  rr 17 sat 92%-94% on RA. CXR Mild right basilar opacity, likely due to atelectasis, infection or aspiration are additional considerations. D-dimer pending. Admitted to observation for COVID d/t age/comorbids. Started Paxlovid, no steroids d/t O2 WNL 08/15: maintained continuous pulse ox reasinds 92%+ overnight on RA and home CPAP. Tachycardia/tachypnea resolved. Afebrile. No concerns on AM labs. However patient quite weak so continued to observe. SpO2 WNL at rest but desatting w/ minimal activity. Placed on O2 and started steroids, transitioned to inpatient, CTA chest

## 2022-05-04 NOTE — Assessment & Plan Note (Signed)
   O2  Remdesevir  Dexamethasone  Not meeting criteria for tocilizumab or baricitinib

## 2022-05-04 NOTE — Assessment & Plan Note (Signed)
-   Resume home metoprolol 

## 2022-05-04 NOTE — Progress Notes (Signed)
Patient ID: Alison Russo, female   DOB: 1926/09/11, 88 y.o.   MRN: [id number]  Patient lying in bed on room air. Oxygen saturation 95%. Patient sat up on side of bed, dyspnea on exertion, oxygen saturation dropped to 87% on room air. Patient unable to complete ambulation in room at this time. Placed on continuous pulse ox.  Lidia Collum, RN

## 2022-05-04 NOTE — Assessment & Plan Note (Signed)
   Continue home sertaline

## 2022-05-04 NOTE — Assessment & Plan Note (Addendum)
   Hold statin while on Paxlovid

## 2022-05-04 NOTE — Progress Notes (Signed)
Initial Nutrition Assessment  DOCUMENTATION CODES:   Not applicable  INTERVENTION:   Ensure Enlive po TID, each supplement provides 350 kcal and 20 grams of protein.  MVI po daily   Liberalize diet   Pt at high refeed risk; recommend monitor potassium, magnesium and phosphorus labs daily until stable  NUTRITION DIAGNOSIS:   Increased nutrient needs related to acute illness (COVID 19) as evidenced by estimated needs.  GOAL:   Patient will meet greater than or equal to 90% of their needs  MONITOR:   PO intake, Supplement acceptance, Labs, Weight trends, Skin, I & O's  REASON FOR ASSESSMENT:   Consult Assessment of nutrition requirement/status  ASSESSMENT:   88 y.o. female with medical history significant of Afib, CAD s/p NSTEMI s/p LAD stent, obesity,depression, OSA on CPAP, IDA, HLD, GERD, recurrent PE and PVD s/p RLE angioplasty and stent placement who is admitted with COVID 19.  Met with pt in room today. Pt reports good appetite and oral intake at baseline but reports poor oral intake for the past several days r/t COVID 19. Pt reports that her appetite and oral intake remain poor in hospital. Pt did not touch her lunch tray today but reports eating 1/2 of a fish sandwich from [employer]. Pt reports that she has never tried supplements before but reports that she is willing to try them in hospital. RD discussed with pt the importance of adequate nutrition needed to preserve lean muscle. RD will add supplements and MVI to help pt meet her estimated needs. RD will also liberalize pt's diet. Pt is at high refeed risk. Per chart, pt appears weight stable pta.   Medications reviewed and include: heparin, MVI   Labs reviewed: K 3.8 wnl, P 3.5 wnl, Mg 2.2 wnl  NUTRITION - FOCUSED PHYSICAL EXAM:  Flowsheet Row Most Recent Value  Orbital Region No depletion  Upper Arm Region No depletion  Thoracic and Lumbar Region No depletion  Buccal Region No depletion  Temple Region No  depletion  Clavicle Bone Region Mild depletion  Clavicle and Acromion Bone Region Mild depletion  Scapular Bone Region No depletion  Dorsal Hand Mild depletion  Patellar Region No depletion  Anterior Thigh Region No depletion  Posterior Calf Region No depletion  Edema (RD Assessment) Mild  Hair Reviewed  Eyes Reviewed  Mouth Reviewed  Skin Reviewed  Nails Reviewed   Diet Order:   Diet Order             Diet regular Room service appropriate? Yes; Fluid consistency: Thin  Diet effective now                  EDUCATION NEEDS:   Education needs have been addressed  Skin:  Skin Assessment: Reviewed RN Assessment  Last BM:  8/15- TYPE 5  Height:   Ht Readings from Last 1 Encounters:  05/03/22 '5\' 6"'  (1.676 m)    Weight:   Wt Readings from Last 1 Encounters:  05/04/22 77.7 kg    Ideal Body Weight:  59 kg  BMI:  Body mass index is 27.65 kg/m.  Estimated Nutritional Needs:   Kcal:  1700-1900kcal/day  Protein:  85-95g/day  Fluid:  1.5-1.8L/day  Koleen Distance MS, RD, LDN Please refer to [facility] for RD and/or RD on-call/weekend/after hours pager

## 2022-05-04 NOTE — Assessment & Plan Note (Signed)
--  continue CPAP

## 2022-05-04 NOTE — Assessment & Plan Note (Signed)
(+)  nitrite

## 2022-05-04 NOTE — Assessment & Plan Note (Addendum)
   Continue Plavix, beta blocker  Hold statin while on Paxlovid

## 2022-05-04 NOTE — Progress Notes (Addendum)
PROGRESS NOTE    Alison Russo   INO:[id number] DOB: [birth year]  DOA: 05/03/2022 Date of Service: 05/04/22 PCP: Oneita Hurt, No     Brief Narrative / Hospital Course:  Alison Russo is a 88 y.o. female with medical history significant of AFIB intolerant to full dose AC on plavix,CAD s/p NSTEMI s/p LAD stent, Obesity Class 1,depression, OSA on CPAP, IDA,HLD,GERD, Hx of recurrent PE , PVD s/p RLE angioplasty and stent placement,who present to ED 05/03/2022 with chills/cough/fever/sob that began over the last 24 hours.  08/14: (+)COVID, temp 100, BP 140/90, hr 103,  rr 17 sat 92%-94% on RA. CXR Mild right basilar opacity, likely due to atelectasis, infection or aspiration are additional considerations. D-dimer pending. Admitted to observation for COVID d/t age/comorbids. Started Paxlovid, no steroids d/t O2 WNL 08/15: maintained continuous pulse ox reasinds 92%+ overnight on RA and home CPAP. Tachycardia/tachypnea resolved. Afebrile. No concerns on AM labs. However patient quite weak so continued to observe. SpO2 WNL at rest but desatting w/ minimal activity. Placed on O2 and started steroids, transitioned to inpatient, CTA chest   Consultants:  none  Procedures: none    Subjective: Patient reports feelinfg weak, SOB, coughing      ASSESSMENT & PLAN:   Principal Problem:   COVID-19 Active Problems:   CAD (coronary artery disease)   Hyperlipidemia   Tachycardia   Sleep apnea   Acute respiratory failure with hypoxia (HCC)   Anxiety and depression   COVID-19 O2 Remdesevir Dexamethasone Not meeting criteria for tocilizumab or baricitinib  Tachycardia Resume home metoprolol   Sleep apnea continue CPAP  Hyperlipidemia Hold statin while on Paxlovid  CAD (coronary artery disease) Continue Plavix, beta blocker Hold statin while on Paxlovid  Acute respiratory failure with hypoxia (HCC) Due to covid, possible PE O2 supplemental  Continuous pulse ox Telemetry CTA  chest   Anxiety and depression Continue home sertaline  Abnormal urinalysis (+)nitrite      DVT prophylaxis: heparin Code Status: FULL Family Communication: spoke to daughter  Disposition Plan / TOC needs: switched from obs to inpatient  Barriers to discharge / significant pending items: requiring O2              Objective: Vitals:   05/04/22 0500 05/04/22 0749 05/04/22 0900 05/04/22 1612  BP:   (!) 142/87 (!) 156/106  Pulse:   (!) 107 82  Resp:   16 19  Temp:   98.5 F (36.9 C) 98.3 F (36.8 C)  TempSrc:    Oral  SpO2:  93% 93% 93%  Weight: 77.7 kg     Height:        Intake/Output Summary (Last 24 hours) at 05/04/2022 1702 Last data filed at 05/04/2022 0900 Gross per 24 hour  Intake --  Output 650 ml  Net -650 ml   Filed Weights   05/03/22 1309 05/04/22 0500  Weight: 81.6 kg 77.7 kg    Examination:  Constitutional:  VS as above General Appearance: alert, frail appearing but not cachectic, NAD Ears, Nose, Mouth, Throat: Normal external appearance Neck: No masses, trachea midline Respiratory: No wheeze No rhonchi No rales Cardiovascular: S1/S2 normal No lower extremity edema Gastrointestinal: No tenderness Musculoskeletal:  Symmetrical movement in all extremities Neurological: No cranial nerve deficit on limited exam Alert Psychiatric: Normal judgment/insight Normal mood and affect       Scheduled Medications:   clopidogrel  75 mg Oral Daily   dexamethasone (DECADRON) injection  8 mg Intravenous Q24H   feeding supplement  237 mL Oral TID BM   heparin  5,000 Units Subcutaneous Q8H   metoprolol tartrate  25 mg Oral BID   [START ON 05/05/2022] multivitamin with minerals  1 tablet Oral Daily   nirmatrelvir/ritonavir EUA  3 tablet Oral BID   polyethylene glycol  17 g Oral Daily   sertraline  100 mg Oral Daily    Continuous Infusions:   PRN Medications:  acetaminophen, acetaminophen, chlorpheniramine-HYDROcodone,  guaiFENesin-dextromethorphan, iohexol, ipratropium, ondansetron **OR** ondansetron (ZOFRAN) IV  Antimicrobials:  Anti-infectives (From admission, onward)    Start     Dose/Rate Route Frequency Ordered Stop   05/03/22 2200  nirmatrelvir/ritonavir EUA (PAXLOVID) 3 tablet        3 tablet Oral 2 times daily 05/03/22 1808 05/08/22 2159   05/03/22 2200  nirmatrelvir/ritonavir EUA (PAXLOVID) 3 tablet  Status:  Discontinued        3 tablet Oral 2 times daily 05/03/22 1906 05/03/22 1908       Data Reviewed: I have personally reviewed following labs and imaging studies  CBC: Recent Labs  Lab 05/03/22 1354 05/03/22 1928 05/04/22 0446  WBC 8.0 6.0 5.9  NEUTROABS 7.1  --  4.2  HGB 14.5 11.8* 12.6  HCT 44.7 36.9 39.2  MCV 92.2 93.4 91.2  PLT 111* 108* 86*   Basic Metabolic Panel: Recent Labs  Lab 05/03/22 1354 05/03/22 1928 05/04/22 0446  NA 137  --  139  K 4.0  --  3.8  CL 105  --  111  CO2 23  --  24  GLUCOSE 142*  --  100*  BUN 20  --  18  CREATININE 0.82 0.70 0.62  CALCIUM 9.5  --  8.8*  MG  --   --  2.2  PHOS  --   --  3.5   GFR: Estimated Creatinine Clearance: 45.3 mL/min (by C-G formula based on SCr of 0.62 mg/dL). Liver Function Tests: Recent Labs  Lab 05/03/22 1354 05/04/22 0446  AST 25 23  ALT 14 11  ALKPHOS 67 51  BILITOT 1.1 1.1  PROT 7.7 6.3*  ALBUMIN 4.0 3.3*   No results for input(s): "LIPASE", "AMYLASE" in the last 168 hours. No results for input(s): "AMMONIA" in the last 168 hours. Coagulation Profile: Recent Labs  Lab 05/03/22 1354  INR 1.1   Cardiac Enzymes: No results for input(s): "CKTOTAL", "CKMB", "CKMBINDEX", "TROPONINI" in the last 168 hours. BNP (last 3 results) No results for input(s): "PROBNP" in the last 8760 hours. HbA1C: No results for input(s): "HGBA1C" in the last 72 hours. CBG: No results for input(s): "GLUCAP" in the last 168 hours. Lipid Profile: No results for input(s): "CHOL", "HDL", "LDLCALC", "TRIG", "CHOLHDL",  "LDLDIRECT" in the last 72 hours. Thyroid Function Tests: No results for input(s): "TSH", "T4TOTAL", "FREET4", "T3FREE", "THYROIDAB" in the last 72 hours. Anemia Panel: Recent Labs    05/03/22 1928 05/04/22 0446  FERRITIN 116 158   Urine analysis: To be collected  Sepsis Labs: @LABRCNTIP (procalcitonin:4,lacticidven:4)  Recent Results (from the past 240 hour(s))  Blood Culture (routine x 2)     Status: None (Preliminary result)   Collection Time: 05/03/22  1:54 PM   Specimen: BLOOD  Result Value Ref Range Status   Specimen Description BLOOD RIGHT ANTECUBITAL  Final   Special Requests   Final    BOTTLES DRAWN AEROBIC AND ANAEROBIC Blood Culture adequate volume   Culture   Final    NO GROWTH < 24 HOURS Performed at [facility], [address]  [place], [place] [address]    Report Status PENDING  Incomplete  Resp Panel by RT-PCR (Flu A&B, Covid) Anterior Nasal Swab     Status: Abnormal   Collection Time: 05/03/22  4:43 PM   Specimen: Anterior Nasal Swab  Result Value Ref Range Status   SARS Coronavirus 2 by RT PCR POSITIVE (A) NEGATIVE Final    Comment: (NOTE) SARS-CoV-2 target nucleic acids are DETECTED.  The SARS-CoV-2 RNA is generally detectable in upper respiratory specimens during the acute phase of infection. Positive results are indicative of the presence of the identified virus, but do not rule out bacterial infection or co-infection with other pathogens not detected by the test. Clinical correlation with patient history and other diagnostic information is necessary to determine patient infection status. The expected result is Negative.  Fact Sheet for Patients: BloggerCourse.com  Fact Sheet for Healthcare Providers: SeriousBroker.it  This test is not yet approved or cleared by the [place] FDA and  has been authorized for detection and/or diagnosis of SARS-CoV-2 by FDA under an Emergency Use  Authorization (EUA).  This EUA will remain in effect (meaning this test can be used) for the duration of  the COVID-19 declaration under Section 564(b)(1) of the A ct, 21 U.S.C. section 360bbb-3(b)(1), unless the authorization is terminated or revoked sooner.     Influenza A by PCR NEGATIVE NEGATIVE Final   Influenza B by PCR NEGATIVE NEGATIVE Final    Comment: (NOTE) The Xpert Xpress SARS-CoV-2/FLU/RSV plus assay is intended as an aid in the diagnosis of influenza from Nasopharyngeal swab specimens and should not be used as a sole basis for treatment. Nasal washings and aspirates are unacceptable for Xpert Xpress SARS-CoV-2/FLU/RSV testing.  Fact Sheet for Patients: BloggerCourse.com  Fact Sheet for Healthcare Providers: SeriousBroker.it  This test is not yet approved or cleared by the [place] FDA and has been authorized for detection and/or diagnosis of SARS-CoV-2 by FDA under an Emergency Use Authorization (EUA). This EUA will remain in effect (meaning this test can be used) for the duration of the COVID-19 declaration under Section 564(b)(1) of the Act, 21 U.S.C. section 360bbb-3(b)(1), unless the authorization is terminated or revoked.  Performed at [facility], [address] [place], [place] [address]   Blood Culture (routine x 2)     Status: None (Preliminary result)   Collection Time: 05/03/22  7:28 PM   Specimen: BLOOD  Result Value Ref Range Status   Specimen Description BLOOD BLOOD RIGHT HAND  Final   Special Requests   Final    BOTTLES DRAWN AEROBIC AND ANAEROBIC Blood Culture results may not be optimal due to an inadequate volume of blood received in culture bottles   Culture   Final    NO GROWTH < 12 HOURS Performed at [facility], [address] [place], [place] [address]    Report Status PENDING  Incomplete         Radiology Studies: DG Chest 2 View  Result Date:  05/03/2022 CLINICAL DATA:  Questionable sepsis EXAM: CHEST - 2 VIEW COMPARISON:  Chest x-ray dated March 28, 2020 FINDINGS: Unchanged cardiomegaly and tortuosity of the thoracic aorta. Mild right basilar opacity. No pleural effusion or pneumothorax. Age indeterminate severe compression deformity of the approximate L1 vertebral body. IMPRESSION: 1. Mild right basilar opacity, likely due to atelectasis, infection or aspiration are additional considerations. 2. Age indeterminate severe compression deformity of the approximate L1 vertebral body. Correlate for point tenderness. Electronically Signed   By: Tacey Ruiz  Strickland M.D.   On: 05/03/2022 15:00            LOS: 0 days     Sunnie Nielsen, DO [facility] 05/04/2022, 5:02 PM   Staff may message me via secure chat in Epic  but this may not receive immediate response,  please page for urgent matters!  If 7PM-7AM, please contact night-coverage [email or web address]  Dictation software was used to generate the above note. Typos may occur and escape review, as with typed/written notes. Please contact Dr Lyn Hollingshead directly for clarity if needed.

## 2022-05-05 DIAGNOSIS — U071 COVID-19: Secondary | ICD-10-CM | POA: Diagnosis not present

## 2022-05-05 DIAGNOSIS — L723 Sebaceous cyst: Secondary | ICD-10-CM

## 2022-05-05 LAB — COMPREHENSIVE METABOLIC PANEL
ALT: 16 U/L (ref 0–44)
AST: 29 U/L (ref 15–41)
Albumin: 3.6 g/dL (ref 3.5–5.0)
Alkaline Phosphatase: 60 U/L (ref 38–126)
Anion gap: 5 (ref 5–15)
BUN: 17 mg/dL (ref 8–23)
CO2: 27 mmol/L (ref 22–32)
Calcium: 9.2 mg/dL (ref 8.9–10.3)
Chloride: 108 mmol/L (ref 98–111)
Creatinine, Ser: 0.65 mg/dL (ref 0.44–1.00)
GFR, Estimated: >60 mL/min (ref >60)
Glucose, Bld: 170 mg/dL — ABNORMAL HIGH (ref 70–99)
Potassium: 5.1 mmol/L (ref 3.5–5.1)
Sodium: 140 mmol/L (ref 135–145)
Total Bilirubin: 0.6 mg/dL (ref 0.3–1.2)
Total Protein: 7.3 g/dL (ref 6.5–8.1)

## 2022-05-05 LAB — URINALYSIS, COMPLETE (UACMP) WITH MICROSCOPIC
Bilirubin Urine: NEGATIVE
Glucose, UA: >=500 mg/dL — AB
Hgb urine dipstick: NEGATIVE
Ketones, ur: NEGATIVE mg/dL
Leukocytes,Ua: NEGATIVE
Nitrite: POSITIVE — AB
Protein, ur: 30 mg/dL — AB
Specific Gravity, Urine: 1.032 — ABNORMAL HIGH (ref 1.005–1.030)
pH: 5 (ref 5.0–8.0)

## 2022-05-05 LAB — D-DIMER, QUANTITATIVE: D-Dimer, Quant: 1.71 ug/mL-FEU — ABNORMAL HIGH (ref 0.00–0.50)

## 2022-05-05 LAB — CBC WITH DIFFERENTIAL/PLATELET
Abs Immature Granulocytes: 0.01 10*3/uL (ref 0.00–0.07)
Basophils Absolute: 0 10*3/uL (ref 0.0–0.1)
Basophils Relative: 0 %
Eosinophils Absolute: 0 10*3/uL (ref 0.0–0.5)
Eosinophils Relative: 0 %
HCT: 44.1 % (ref 36.0–46.0)
Hemoglobin: 14.3 g/dL (ref 12.0–15.0)
Immature Granulocytes: 0 %
Lymphocytes Relative: 15 %
Lymphs Abs: 0.5 10*3/uL — ABNORMAL LOW (ref 0.7–4.0)
MCH: 29.4 pg (ref 26.0–34.0)
MCHC: 32.4 g/dL (ref 30.0–36.0)
MCV: 90.6 fL (ref 80.0–100.0)
Monocytes Absolute: 0.1 10*3/uL (ref 0.1–1.0)
Monocytes Relative: 2 %
Neutro Abs: 3 10*3/uL (ref 1.7–7.7)
Neutrophils Relative %: 83 %
Platelets: 110 10*3/uL — ABNORMAL LOW (ref 150–400)
RBC: 4.87 MIL/uL (ref 3.87–5.11)
RDW: 13 % (ref 11.5–15.5)
WBC: 3.6 10*3/uL — ABNORMAL LOW (ref 4.0–10.5)
nRBC: 0 % (ref 0.0–0.2)

## 2022-05-05 LAB — C-REACTIVE PROTEIN: CRP: 3.3 mg/dL — ABNORMAL HIGH (ref <1.0)

## 2022-05-05 LAB — MAGNESIUM: Magnesium: 2.3 mg/dL (ref 1.7–2.4)

## 2022-05-05 LAB — PHOSPHORUS: Phosphorus: 3.6 mg/dL (ref 2.5–4.6)

## 2022-05-05 LAB — PROCALCITONIN: Procalcitonin: <0.1 ng/mL

## 2022-05-05 LAB — FERRITIN: Ferritin: 187 ng/mL (ref 11–307)

## 2022-05-05 MED ORDER — HYDRALAZINE HCL 20 MG/ML IJ SOLN
5.0000 mg | INTRAMUSCULAR | Status: DC | PRN
  Administered 2022-05-05 – 2022-05-06 (×2): 5 mg via INTRAVENOUS
  Filled 2022-05-05 (×3): qty 1

## 2022-05-05 MED ORDER — IRBESARTAN 75 MG PO TABS
37.5000 mg | ORAL_TABLET | Freq: Every day | ORAL | Status: DC
  Administered 2022-05-05 – 2022-05-06 (×2): 37.5 mg via ORAL
  Filled 2022-05-05 (×2): qty 0.5

## 2022-05-05 MED ORDER — LABETALOL HCL 5 MG/ML IV SOLN
5.0000 mg | Freq: Once | INTRAVENOUS | Status: AC
  Administered 2022-05-05: 5 mg via INTRAVENOUS
  Filled 2022-05-05: qty 4

## 2022-05-05 NOTE — Evaluation (Signed)
Physical Therapy Evaluation Patient Details Name: Alison Russo MRN: [id number] DOB: [birth year] Today's Date: 05/05/2022  History of Present Illness  Alison Russo is a 88 y.o. female with medical history significant of AFIB intolerant to full dose AC on plavix,CAD s/p NSTEMI s/p LAD stent, Obesity Class 1,depression, OSA on CPAP, IDA,HLD,GERD, Hx of recurrent PE , PVD s/p RLE angioplasty and stent placement,who present to Ed with chills/cough /fever/n as well as sob that began over the last 24 hours.  Clinical Impression  Patient receivied up in recliner. Looking for her menu. She is agreeable to PT assessment. She is able to transfer sit to stand and lower/elevate legs of recliner independently. Patient initially ambulated without AD, reaching for furniture to steady. Then ambulated further with RW. Improved safety when using RW. Patient instructed to use walker at home for now. She will continue to benefit from skilled PT while here to improve strength and activity tolerance.        Recommendations for follow up therapy are one component of a multi-disciplinary discharge planning process, led by the attending physician.  Recommendations may be updated based on patient status, additional functional criteria and insurance authorization.  Follow Up Recommendations        Assistance Recommended at Discharge Intermittent Supervision/Assistance  Patient can return home with the following  A little help with walking and/or transfers;A little help with bathing/dressing/bathroom;Assist for transportation;Help with stairs or ramp for entrance;Assistance with cooking/housework    Equipment Recommendations None recommended by PT  Recommendations for Other Services       Functional Status Assessment Patient has had a recent decline in their functional status and demonstrates the ability to make significant improvements in function in a reasonable and predictable amount of time.      Precautions / Restrictions Precautions Precautions: Fall Restrictions Weight Bearing Restrictions: No      Mobility  Bed Mobility               General bed mobility comments: patient received in recliner and remained in recliner    Transfers Overall transfer level: Modified independent Equipment used: None                    Ambulation/Gait Ambulation/Gait assistance: Supervision Gait Distance (Feet): 25 Feet Assistive device: Rolling walker (2 wheels) Gait Pattern/deviations: Step-through pattern, Decreased step length - right, Decreased step length - left, Decreased stride length Gait velocity: decr     General Gait Details: patient ambulated about 10 feet without AD, 15 feet with RW. Improved stability with RW. Has one at home for use. O2 sats remained in 90%s with mobility on room air  Stairs            Wheelchair Mobility    Modified Rankin (Stroke Patients Only)       Balance Overall balance assessment: Needs assistance Sitting-balance support: Feet supported Sitting balance-Leahy Scale: Good     Standing balance support: Bilateral upper extremity supported, During functional activity, Reliant on assistive device for balance Standing balance-Leahy Scale: Good Standing balance comment: fair without AD, improved with RW                             Pertinent Vitals/Pain Pain Assessment Pain Assessment: No/denies pain    Home Living Family/patient expects to be discharged to:: Private residence Living Arrangements: Alone (she will stay with daughter when discharged) Available Help at Discharge: Family;Available 24 hours/day Type of  Home: House           Home Equipment: Agricultural consultant (2 wheels)      Prior Function Prior Level of Function : Independent/Modified Independent             Mobility Comments: uses walker when out of house ADLs Comments: mod I     Hand Dominance        Extremity/Trunk  Assessment   Upper Extremity Assessment Upper Extremity Assessment: Overall WFL for tasks assessed    Lower Extremity Assessment Lower Extremity Assessment: Overall WFL for tasks assessed    Cervical / Trunk Assessment Cervical / Trunk Assessment: Normal  Communication   Communication: No difficulties  Cognition Arousal/Alertness: Awake/alert Behavior During Therapy: WFL for tasks assessed/performed Overall Cognitive Status: Within Functional Limits for tasks assessed                                          General Comments      Exercises     Assessment/Plan    PT Assessment Patient needs continued PT services  PT Problem List Decreased strength;Decreased mobility;Decreased balance;Decreased activity tolerance;Decreased safety awareness       PT Treatment Interventions DME instruction;Therapeutic exercise;Gait training;Stair training;Functional mobility training;Therapeutic activities;Patient/family education;Balance training    PT Goals (Current goals can be found in the Care Plan section)  Acute Rehab PT Goals Patient Stated Goal: to go home soon PT Goal Formulation: With patient Time For Goal Achievement: 05/12/22 Potential to Achieve Goals: Good    Frequency Min 2X/week     Co-evaluation               AM-PAC PT "6 Clicks" Mobility  Outcome Measure Help needed turning from your back to your side while in a flat bed without using bedrails?: A Little Help needed moving from lying on your back to sitting on the side of a flat bed without using bedrails?: A Little Help needed moving to and from a bed to a chair (including a wheelchair)?: A Little Help needed standing up from a chair using your arms (e.g., wheelchair or bedside chair)?: None Help needed to walk in hospital room?: A Little Help needed climbing 3-5 steps with a railing? : A Little 6 Click Score: 19    End of Session   Activity Tolerance: Patient tolerated treatment  well Patient left: in chair;with call [place]/phone within reach Nurse Communication: Mobility status PT Visit Diagnosis: Unsteadiness on feet (R26.81);Muscle weakness (generalized) (M62.81)    Time: 3435-6861 PT Time Calculation (min) (ACUTE ONLY): 14 min   Charges:   PT Evaluation $PT Eval Low Complexity: 1 Low          Junah Yam, PT, GCS 05/05/22,11:51 AM

## 2022-05-05 NOTE — TOC Initial Note (Signed)
Transition of Care [facility]) - Initial/Assessment Note    Patient Details  Name: Alison Russo MRN: [id number] Date of Birth: 1926/03/03  Transition of Care [facility]) CM/SW Contact:    Chapman Fitch, RN Phone Number: 05/05/2022, 2:46 PM  Clinical Narrative:                     Transition of Care [facility]) Screening Note   Patient Details  Name: Alison Russo Date of Birth: Apr 22, 1926   Transition of Care [facility]) CM/SW Contact:    Chapman Fitch, RN Phone Number: 05/05/2022, 2:46 PM    Transition of Care Department [facility]) has reviewed patient and no TOC needs have been identified at this time. We will continue to monitor patient advancement through interdisciplinary progression rounds. If new patient transition needs arise, please place a TOC consult.  Daughter at bedside to transport home. Patient has been weaned to RA. PT has assessed and said no PT follow up     Patient Goals and CMS Choice        Expected Discharge Plan and Services                                                Prior Living Arrangements/Services                       Activities of Daily Living Home Assistive Devices/Equipment: Eyeglasses, Cane (specify quad or straight), Dentures (specify type), Hearing aid ADL Screening (condition at time of admission) Patient's cognitive ability adequate to safely complete daily activities?: Yes Is the patient deaf or have difficulty hearing?: Yes Does the patient have difficulty seeing, even when wearing glasses/contacts?: No Does the patient have difficulty concentrating, remembering, or making decisions?: No Patient able to express need for assistance with ADLs?: Yes Does the patient have difficulty dressing or bathing?: No Independently performs ADLs?: Yes (appropriate for developmental age) Does the patient have difficulty walking or climbing stairs?: No Weakness of Legs: Both Weakness of Arms/Hands: None  Permission  Sought/Granted                  Emotional Assessment              Admission diagnosis:  Atrial fibrillation, unspecified type (HCC) [I48.91] COVID-19 [U07.1] Patient Active Problem List   Diagnosis Date Noted   Anxiety and depression 05/04/2022   COVID-19 05/03/2022   Atherosclerosis of native arteries of extremity with intermittent claudication (HCC) 11/27/2021   Peripheral vascular disease (HCC) 06/09/2021   Ischemic leg pain 02/13/2021   Age-related osteoporosis with current pathological fracture of right femur with routine healing 04/03/2020   Closed fracture of right femur (HCC)    Acute respiratory failure with hypoxia (HCC)    Acute blood loss anemia    Closed displaced fracture of proximal epiphysis of right femur (HCC) 03/26/2020   Sleep apnea 11/29/2018   Gastroesophageal reflux disease without esophagitis 02/20/2018   Mood disorder (HCC)    Hematoma 02/01/2018   Lymphedema 02/01/2018   Leg pain 01/09/2018   Atrial fibrillation (HCC) 12/28/2017   Heart murmur 12/28/2017   Tachycardia 12/28/2017   CAD (coronary artery disease) 12/25/2017   Essential hypertension 12/25/2017   Hyperlipidemia 12/25/2017   History of pulmonary embolism 12/25/2017   Anemia 12/25/2017   Chronic anticoagulation 12/22/2017   NSTEMI (  non-ST elevated myocardial infarction) (HCC) 12/21/2017   Contusion of right knee 02/27/2016   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 [place] [address] Phone: [phone] Fax: [phone]  [id number] Cl[facility] - [place], [place] - [address] [place] [address] Phone: [phone] Fax: [phone]  [facility] [address]), [place] - [address] [place] ([place]) [place] [address] Phone: [phone] Fax: [phone]     Social Determinants of Health  (SDOH) Interventions    Readmission Risk Interventions     No data to display

## 2022-05-05 NOTE — Progress Notes (Signed)
PROGRESS NOTE    Alison Russo   GDJ:[id number] DOB: October 10, 1925  DOA: 05/03/2022 Date of Service: 05/05/22 PCP: Oneita Hurt, No     Brief Narrative / Hospital Course:  Alison Russo is a 88 y.o. female with medical history significant of AFIB intolerant to full dose AC on plavix, CAD s/p NSTEMI s/p LAD stent, Obesity Class 1,depression, OSA on CPAP, IDA,HLD,GERD, Hx of recurrent PE , PVD s/p RLE angioplasty and stent placement,who present to ED 05/03/2022 with chills/cough/fever/sob that began over the last 24 hours.  08/14: (+)COVID, temp 100, BP 140/90, hr 103,  rr 17 sat 92%-94% on RA. CXR Mild right basilar opacity, likely due to atelectasis, infection or aspiration are additional considerations. D-dimer pending. Admitted to observation for COVID d/t age/comorbids. Started Paxlovid, no steroids d/t O2 WNL 08/15: maintained continuous pulse ox reasinds 92%+ overnight on RA and home CPAP. Tachycardia/tachypnea resolved. Afebrile. No concerns on AM labs. However patient quite weak so continued to observe. SpO2 WNL at rest but desatting w/ minimal activity. Placed on O2 and started steroids, transitioned to inpatient, CTA chest no PE 08/16: AM labs ok, hypertensive overnight received IV prns and in AM added ARB given hx CAD. Weaning to RA and improving. BP better in afternoon. C/o spot on her L upper arm bothering her for few weeks - see A/P.    Consultants:  none  Procedures: none    Subjective: Patient reports feelinfg weak, SOB, coughing      ASSESSMENT & PLAN:   Principal Problem:   COVID-19 Active Problems:   CAD (coronary artery disease)   Hyperlipidemia   Tachycardia   Sleep apnea   Acute respiratory failure with hypoxia (HCC)   Anxiety and depression   Sebaceous cyst   COVID-19 O2 --> RA Remdesevir bid x 10 doses --> started PM 05/03/22 Dexamethasone --> po steroids on discharge complete 5 days total  Not meeting criteria for tocilizumab or  baricitinib  Tachycardia Resume home metoprolol   Sleep apnea continue CPAP  Hyperlipidemia Hold statin while on Paxlovid  CAD (coronary artery disease) Continue Plavix, beta blocker Hold statin while on Paxlovid  Acute respiratory failure with hypoxia (HCC) Resolved Due to covid, possible PE O2 supplemental --> RA Continuous pulse ox --> off to pulse ox w/ VS Telemetry --> D/c  CTA chest --> no PE  Anxiety and depression Continue home sertaline  Abnormal urinalysis (+)nitrite  Sebaceous cyst At bedside patient verbally consented for pinprick cystic lesion w/ 11 blade, skin was cleaned w/ alcohol, pinprick was not even needed as I used scalpel to lift scab and manually expressed sebaceous material to patient relief, area bandaged.  Given inflammatory changes, would not rule out skin cancer, follow w/ PCP or dermatology for biopsy or other workup       DVT prophylaxis: heparin Code Status: FULL Family Communication: spoke to daughter  Disposition Plan / TOC needs: switched from obs to inpatient  Barriers to discharge / significant pending items: requiring O2              Objective: Vitals:   05/05/22 0830 05/05/22 1101 05/05/22 1143 05/05/22 1409  BP: (!) 159/106 (!) 147/96  127/87  Pulse:  69  67  Resp:      Temp: 98.1 F (36.7 C)     TempSrc: Oral     SpO2:   95% 98%  Weight:      Height:        Intake/Output Summary (Last 24 hours) at  05/05/2022 1524 Last data filed at 05/05/2022 1301 Gross per 24 hour  Intake 480 ml  Output 1200 ml  Net -720 ml   Filed Weights   05/03/22 1309 05/04/22 0500 05/05/22 0522  Weight: 81.6 kg 77.7 kg 78.1 kg    Examination:  Constitutional:  VS as above General Appearance: alert, frail appearing but not cachectic, NAD Ears, Nose, Mouth, Throat: Normal external appearance Neck: No masses, trachea midline Respiratory: No wheeze No rhonchi No rales Cardiovascular: S1/S2 normal No lower extremity  edema Gastrointestinal: No tenderness Musculoskeletal:  Symmetrical movement in all extremities Neurological: No cranial nerve deficit on limited exam Alert Psychiatric: Normal judgment/insight Normal mood and affect       Scheduled Medications:   clopidogrel  75 mg Oral Daily   dexamethasone (DECADRON) injection  8 mg Intravenous Q24H   feeding supplement  237 mL Oral TID BM   heparin  5,000 Units Subcutaneous Q8H   irbesartan  37.5 mg Oral Daily   metoprolol tartrate  25 mg Oral BID   multivitamin with minerals  1 tablet Oral Daily   nirmatrelvir/ritonavir EUA  3 tablet Oral BID   polyethylene glycol  17 g Oral Daily   sertraline  100 mg Oral Daily    Continuous Infusions:   PRN Medications:  acetaminophen, chlorpheniramine-HYDROcodone, guaiFENesin-dextromethorphan, hydrALAZINE, ipratropium, ondansetron **OR** ondansetron (ZOFRAN) IV  Antimicrobials:  Anti-infectives (From admission, onward)    Start     Dose/Rate Route Frequency Ordered Stop   05/03/22 2200  nirmatrelvir/ritonavir EUA (PAXLOVID) 3 tablet        3 tablet Oral 2 times daily 05/03/22 1808 05/08/22 2159   05/03/22 2200  nirmatrelvir/ritonavir EUA (PAXLOVID) 3 tablet  Status:  Discontinued        3 tablet Oral 2 times daily 05/03/22 1906 05/03/22 1908       Data Reviewed: I have personally reviewed following labs and imaging studies  CBC: Recent Labs  Lab 05/03/22 1354 05/03/22 1928 05/04/22 0446 05/05/22 0452  WBC 8.0 6.0 5.9 3.6*  NEUTROABS 7.1  --  4.2 3.0  HGB 14.5 11.8* 12.6 14.3  HCT 44.7 36.9 39.2 44.1  MCV 92.2 93.4 91.2 90.6  PLT 111* 108* 86* 110*   Basic Metabolic Panel: Recent Labs  Lab 05/03/22 1354 05/03/22 1928 05/04/22 0446 05/05/22 0452  NA 137  --  139 140  K 4.0  --  3.8 5.1  CL 105  --  111 108  CO2 23  --  24 27  GLUCOSE 142*  --  100* 170*  BUN 20  --  18 17  CREATININE 0.82 0.70 0.62 0.65  CALCIUM 9.5  --  8.8* 9.2  MG  --   --  2.2 2.3  PHOS  --    --  3.5 3.6   GFR: Estimated Creatinine Clearance: 45.3 mL/min (by C-G formula based on SCr of 0.65 mg/dL). Liver Function Tests: Recent Labs  Lab 05/03/22 1354 05/04/22 0446 05/05/22 0452  AST 25 23 29   ALT 14 11 16   ALKPHOS 67 51 60  BILITOT 1.1 1.1 0.6  PROT 7.7 6.3* 7.3  ALBUMIN 4.0 3.3* 3.6   No results for input(s): "LIPASE", "AMYLASE" in the last 168 hours. No results for input(s): "AMMONIA" in the last 168 hours. Coagulation Profile: Recent Labs  Lab 05/03/22 1354  INR 1.1   Cardiac Enzymes: No results for input(s): "CKTOTAL", "CKMB", "CKMBINDEX", "TROPONINI" in the last 168 hours. BNP (last 3 results) No results for input(s): "PROBNP"  in the last 8760 hours. HbA1C: No results for input(s): "HGBA1C" in the last 72 hours. CBG: No results for input(s): "GLUCAP" in the last 168 hours. Lipid Profile: No results for input(s): "CHOL", "HDL", "LDLCALC", "TRIG", "CHOLHDL", "LDLDIRECT" in the last 72 hours. Thyroid Function Tests: No results for input(s): "TSH", "T4TOTAL", "FREET4", "T3FREE", "THYROIDAB" in the last 72 hours. Anemia Panel: Recent Labs    05/04/22 0446 05/05/22 0452  FERRITIN 158 187   Urine analysis: To be collected  Sepsis Labs: @LABRCNTIP (procalcitonin:4,lacticidven:4)  Recent Results (from the past 240 hour(s))  Blood Culture (routine x 2)     Status: None (Preliminary result)   Collection Time: 05/03/22  1:54 PM   Specimen: BLOOD  Result Value Ref Range Status   Specimen Description BLOOD RIGHT ANTECUBITAL  Final   Special Requests   Final    BOTTLES DRAWN AEROBIC AND ANAEROBIC Blood Culture adequate volume   Culture   Final    NO GROWTH 2 DAYS Performed at [facility], [address], [place], [place]    Report Status PENDING  Incomplete  Resp Panel by RT-PCR (Flu A&B, Covid) Anterior Nasal Swab     Status: Abnormal   Collection Time: 05/03/22  4:43 PM   Specimen: Anterior Nasal Swab  Result Value Ref Range  Status   SARS Coronavirus 2 by RT PCR POSITIVE (A) NEGATIVE Final    Comment: (NOTE) SARS-CoV-2 target nucleic acids are DETECTED.  The SARS-CoV-2 RNA is generally detectable in upper respiratory specimens during the acute phase of infection. Positive results are indicative of the presence of the identified virus, but do not rule out bacterial infection or co-infection with other pathogens not detected by the test. Clinical correlation with patient history and other diagnostic information is necessary to determine patient infection status. The expected result is Negative.  Fact Sheet for Patients: 05/05/22  Fact Sheet for Healthcare Providers: BloggerCourse.com  This test is not yet approved or cleared by the [employer] FDA and  has been authorized for detection and/or diagnosis of SARS-CoV-2 by FDA under an Emergency Use Authorization (EUA).  This EUA will remain in effect (meaning this test can be used) for the duration of  the COVID-19 declaration under Section 564(b)(1) of the A ct, 21 U.S.C. section 360bbb-3(b)(1), unless the authorization is terminated or revoked sooner.     Influenza A by PCR NEGATIVE NEGATIVE Final   Influenza B by PCR NEGATIVE NEGATIVE Final    Comment: (NOTE) The Xpert Xpress SARS-CoV-2/FLU/RSV plus assay is intended as an aid in the diagnosis of influenza from Nasopharyngeal swab specimens and should not be used as a sole basis for treatment. Nasal washings and aspirates are unacceptable for Xpert Xpress SARS-CoV-2/FLU/RSV testing.  Fact Sheet for Patients: [place]  Fact Sheet for Healthcare Providers: BloggerCourse.com  This test is not yet approved or cleared by the [employer] FDA and has been authorized for detection and/or diagnosis of SARS-CoV-2 by FDA under an Emergency Use Authorization (EUA). This EUA will remain in  effect (meaning this test can be used) for the duration of the COVID-19 declaration under Section 564(b)(1) of the Act, 21 U.S.C. section 360bbb-3(b)(1), unless the authorization is terminated or revoked.  Performed at [facility], [address] [place], [place]   Blood Culture (routine x 2)     Status: None (Preliminary result)   Collection Time: 05/03/22  7:28 PM   Specimen: BLOOD  Result Value Ref Range Status   Specimen Description  BLOOD BLOOD RIGHT HAND  Final   Special Requests   Final    BOTTLES DRAWN AEROBIC AND ANAEROBIC Blood Culture results may not be optimal due to an inadequate volume of blood received in culture bottles   Culture   Final    NO GROWTH 2 DAYS Performed at [facility], [address], [place], [place] [address]    Report Status PENDING  Incomplete         Radiology Studies: DG Chest 2 View  Result Date: 05/03/2022 CLINICAL DATA:  Questionable sepsis EXAM: CHEST - 2 VIEW COMPARISON:  Chest x-ray dated March 28, 2020 FINDINGS: Unchanged cardiomegaly and tortuosity of the thoracic aorta. Mild right basilar opacity. No pleural effusion or pneumothorax. Age indeterminate severe compression deformity of the approximate L1 vertebral body. IMPRESSION: 1. Mild right basilar opacity, likely due to atelectasis, infection or aspiration are additional considerations. 2. Age indeterminate severe compression deformity of the approximate L1 vertebral body. Correlate for point tenderness. Electronically Signed   By: Allegra Lai M.D.   On: 05/03/2022 15:00            LOS: 1 day     Sunnie Nielsen, DO [facility] 05/05/2022, 3:24 PM   Staff may message me via secure chat in Epic  but this may not receive immediate response,  please page for urgent matters!  If 7PM-7AM, please contact night-coverage [email or web address]  Dictation software was used to generate the above note. Typos may occur and escape review, as  with typed/written notes. Please contact Dr Lyn Hollingshead directly for clarity if needed.

## 2022-05-05 NOTE — Assessment & Plan Note (Addendum)
   At bedside patient verbally consented for pinprick cystic lesion w/ 11 blade, skin was cleaned w/ alcohol, pinprick was not even needed as I used scalpel to lift scab and manually expressed sebaceous material to patient relief, area bandaged.   Given inflammatory changes, would not rule out skin cancer, follow w/ PCP or dermatology for biopsy or other workup

## 2022-05-05 NOTE — Progress Notes (Signed)
       CROSS COVER NOTE  NAME: SYNDEY JASKOLSKI MRN: [id number] DOB : 1926-01-17    Date of Service   05/05/22  HPI/Events of Note   Notified of manual BP 180/110. M(r)s [place] has a PMH of HTN and BP has been trending up over the last 24 hrs.  Interventions   Plan: 5 mg IV Labetalol      This document was prepared using Dragon voice recognition software and may include unintentional dictation errors.  Bishop Limbo DNP, MHA, FNP-BC Nurse Practitioner [facility] Pager [phone]

## 2022-05-06 ENCOUNTER — Other Ambulatory Visit: Payer: Self-pay

## 2022-05-06 DIAGNOSIS — J9601 Acute respiratory failure with hypoxia: Secondary | ICD-10-CM

## 2022-05-06 DIAGNOSIS — U071 COVID-19: Secondary | ICD-10-CM | POA: Diagnosis not present

## 2022-05-06 DIAGNOSIS — F32A Depression, unspecified: Secondary | ICD-10-CM

## 2022-05-06 DIAGNOSIS — F419 Anxiety disorder, unspecified: Secondary | ICD-10-CM | POA: Diagnosis not present

## 2022-05-06 DIAGNOSIS — I4891 Unspecified atrial fibrillation: Secondary | ICD-10-CM | POA: Diagnosis not present

## 2022-05-06 DIAGNOSIS — R0602 Shortness of breath: Secondary | ICD-10-CM | POA: Diagnosis not present

## 2022-05-06 LAB — CBC WITH DIFFERENTIAL/PLATELET
Abs Immature Granulocytes: 0.08 10*3/uL — ABNORMAL HIGH (ref 0.00–0.07)
Basophils Absolute: 0 10*3/uL (ref 0.0–0.1)
Basophils Relative: 0 %
Eosinophils Absolute: 0 10*3/uL (ref 0.0–0.5)
Eosinophils Relative: 0 %
HCT: 41.3 % (ref 36.0–46.0)
Hemoglobin: 13.6 g/dL (ref 12.0–15.0)
Immature Granulocytes: 1 %
Lymphocytes Relative: 6 %
Lymphs Abs: 0.7 10*3/uL (ref 0.7–4.0)
MCH: 29.6 pg (ref 26.0–34.0)
MCHC: 32.9 g/dL (ref 30.0–36.0)
MCV: 89.8 fL (ref 80.0–100.0)
Monocytes Absolute: 0.4 10*3/uL (ref 0.1–1.0)
Monocytes Relative: 3 %
Neutro Abs: 10.1 10*3/uL — ABNORMAL HIGH (ref 1.7–7.7)
Neutrophils Relative %: 90 %
Platelets: 129 10*3/uL — ABNORMAL LOW (ref 150–400)
RBC: 4.6 MIL/uL (ref 3.87–5.11)
RDW: 12.9 % (ref 11.5–15.5)
WBC: 11.3 10*3/uL — ABNORMAL HIGH (ref 4.0–10.5)
nRBC: 0 % (ref 0.0–0.2)

## 2022-05-06 LAB — COMPREHENSIVE METABOLIC PANEL
ALT: 15 U/L (ref 0–44)
AST: 29 U/L (ref 15–41)
Albumin: 3.4 g/dL — ABNORMAL LOW (ref 3.5–5.0)
Alkaline Phosphatase: 55 U/L (ref 38–126)
Anion gap: 8 (ref 5–15)
BUN: 27 mg/dL — ABNORMAL HIGH (ref 8–23)
CO2: 23 mmol/L (ref 22–32)
Calcium: 9.2 mg/dL (ref 8.9–10.3)
Chloride: 108 mmol/L (ref 98–111)
Creatinine, Ser: 0.68 mg/dL (ref 0.44–1.00)
GFR, Estimated: >60 mL/min (ref >60)
Glucose, Bld: 167 mg/dL — ABNORMAL HIGH (ref 70–99)
Potassium: 4.4 mmol/L (ref 3.5–5.1)
Sodium: 139 mmol/L (ref 135–145)
Total Bilirubin: 0.4 mg/dL (ref 0.3–1.2)
Total Protein: 6.8 g/dL (ref 6.5–8.1)

## 2022-05-06 LAB — C-REACTIVE PROTEIN: CRP: 1.3 mg/dL — ABNORMAL HIGH (ref <1.0)

## 2022-05-06 LAB — MAGNESIUM: Magnesium: 2.3 mg/dL (ref 1.7–2.4)

## 2022-05-06 LAB — FERRITIN: Ferritin: 166 ng/mL (ref 11–307)

## 2022-05-06 LAB — PHOSPHORUS: Phosphorus: 2.3 mg/dL — ABNORMAL LOW (ref 2.5–4.6)

## 2022-05-06 LAB — D-DIMER, QUANTITATIVE: D-Dimer, Quant: 1.55 ug/mL-FEU — ABNORMAL HIGH (ref 0.00–0.50)

## 2022-05-06 MED ORDER — DEXAMETHASONE 6 MG PO TABS: 6.0000 mg | ORAL_TABLET | Freq: Every day | ORAL | Status: DC

## 2022-05-06 MED ORDER — CEFADROXIL 500 MG PO CAPS
500.0000 mg | ORAL_CAPSULE | Freq: Two times a day (BID) | ORAL | 0 refills | Status: AC
Start: 2022-05-06 — End: 2022-05-11

## 2022-05-06 NOTE — Plan of Care (Signed)
  Problem: Respiratory: Goal: Will maintain a patent airway Outcome: Progressing   Problem: Respiratory: Goal: Complications related to the disease process, condition or treatment will be avoided or minimized Outcome: Progressing   Problem: Education: Goal: Knowledge of General Education information will improve Description: Including pain rating scale, medication(s)/side effects and non-pharmacologic comfort measures Outcome: Progressing   

## 2022-05-08 LAB — URINE CULTURE: Culture: >=100000 — AB

## 2022-05-08 LAB — CULTURE, BLOOD (ROUTINE X 2)
Culture: NO GROWTH
Culture: NO GROWTH
Special Requests: ADEQUATE

## 2022-05-08 NOTE — Discharge Summary (Signed)
Physician Discharge Summary   Patient: Alison Russo Hines MRN: [id number] DOB: 1926/06/10  Admit date:     05/03/2022  Discharge date: 05/06/2022  Discharge Physician: Delfino LovettVipul Chaston Bradburn   PCP: Gracelyn NurseJohnston, John D, MD   Recommendations at discharge:    F/up with outpt providers as requested  Discharge Diagnoses: Principal Problem:   COVID-19 Active Problems:   CAD (coronary artery disease)   Hyperlipidemia   Tachycardia   Sleep apnea   Acute respiratory failure with hypoxia (HCC)   Anxiety and depression   Sebaceous cyst  Hospital Course: Alison Russo Kataoka is a 88 y.o. female with medical history significant of AFIB intolerant to full dose AC on plavix, CAD s/p NSTEMI s/p LAD stent, Obesity Class 1,depression, OSA on CPAP, IDA,HLD,GERD, Hx of recurrent PE , PVD s/p RLE angioplasty and stent placement,who present to ED 05/03/2022 with chills/cough/fever/sob that began over the last 24 hours.  08/14: (+)COVID, temp 100, BP 140/90, hr 103,  rr 17 sat 92%-94% on RA. CXR Mild right basilar opacity, likely due to atelectasis, infection or aspiration are additional considerations. D-dimer pending. Admitted to observation for COVID d/t age/comorbids. Started Paxlovid, no steroids d/t O2 WNL 08/15: maintained continuous pulse ox reasinds 92%+ overnight on RA and home CPAP. Tachycardia/tachypnea resolved. Afebrile. No concerns on AM labs. However patient quite weak so continued to observe. SpO2 WNL at rest but desatting w/ minimal activity. Placed on O2 and started steroids, transitioned to inpatient, CTA chest no PE 08/16: AM labs ok, hypertensive overnight received IV prns and in AM added ARB given hx CAD. Weaning to RA and improving. BP better in afternoon. C/o spot on her Russo upper arm bothering her for few weeks - see A/P.   Assessment and Plan: * COVID-19 Treated with remdesivir and steroids.  Patient is on room air.  Considering she is getting some agitation and jitteriness steroid were discontinued and  she prefers not to continue that anymore.  Sebaceous cyst At bedside patient verbally consented for pinprick cystic lesion w/ 11 blade, skin was cleaned w/ alcohol, pinprick was not even needed as by Dr. Lyn HollingsheadAlexander used scalpel to lift scab and manually expressed sebaceous material to patient relief, area bandaged  Given inflammatory changes, would not rule out skin cancer, follow w/ PCP or dermatology for biopsy or other workup   Anxiety and depression Continue home sertaline  Acute respiratory failure with hypoxia (HCC) Present on admission and now resolved CTA chest --> no PE  Sleep apnea continue CPAP  Tachycardia Now resolved.  Continue metoprolol for rate control  Hyperlipidemia Held statin while on Paxlovid  CAD (coronary artery disease) Continue Plavix, beta blocker         Procedures performed: I&D of sebaceous cyst at bedside by Dr. Lyn HollingsheadAlexander Disposition: Home Diet recommendation:  Discharge Diet Orders (From admission, onward)     Start     Ordered   05/06/22 0000  Diet - low sodium heart healthy        05/06/22 1035           Carb modified diet DISCHARGE MEDICATION: Allergies as of 05/06/2022   No Known Allergies      Medication List     STOP taking these medications    OPCON-A OP       TAKE these medications    acetaminophen 325 MG tablet Commonly known as: TYLENOL Take 2 tablets (650 mg total) by mouth every 6 (six) hours as needed for mild pain (or Fever >/= 101).  atorvastatin 20 MG tablet Commonly known as: LIPITOR Take 4 tablets (80 mg total) by mouth daily.   atorvastatin 80 MG tablet Commonly known as: LIPITOR Take 80 mg by mouth daily.   cefadroxil 500 MG capsule Commonly known as: DURICEF Take 1 capsule (500 mg total) by mouth 2 (two) times daily for 5 days.   clopidogrel 75 MG tablet Commonly known as: PLAVIX Take 1 tablet (75 mg total) by mouth daily.   feeding supplement Liqd Take 237 mLs by mouth 2 (two)  times daily between meals.   JOBST FOR MEN KNEE HIGH/LG Misc Use compression stockings every day   metoprolol tartrate 25 MG tablet Commonly known as: LOPRESSOR Take 1 tablet (25 mg total) by mouth 2 (two) times daily.   polyethylene glycol 17 g packet Commonly known as: MIRALAX / GLYCOLAX Take 17 g by mouth daily.   PRESERVISION AREDS 2+MULTI VIT PO Take 1 tablet by mouth 2 (two) times daily.   sertraline 100 MG tablet Commonly known as: ZOLOFT Take 1 tablet by mouth daily.        Follow-up Information     Gracelyn Nurse, MD. Go on 05/12/2022.   Specialty: Internal Medicine Why: Appointment @ 3:30 pm. Contact information: [address] [place] [address] [phone]                Discharge Exam: Ceasar Mons Weights   05/04/22 0500 05/05/22 0522 05/06/22 0409  Weight: 77.7 kg 78.1 kg 14.70 kg   88 year old female lying in the bed comfortably without any acute distress General Appearance: alert, frail appearing but not cachectic, NAD Ears, Nose, Mouth, Throat: Normal external appearance Neck: No masses, trachea midline Respiratory: No wheeze No rhonchi No rales Cardiovascular: S1/S2 normal No lower extremity edema Gastrointestinal: No tenderness Musculoskeletal:  Symmetrical movement in all extremities Neurological: No cranial nerve deficit on limited exam Alert Psychiatric: Normal judgment/insight Normal mood and affect     Condition at discharge: fair  The results of significant diagnostics from this hospitalization (including imaging, microbiology, ancillary and laboratory) are listed below for reference.   Imaging Studies: CT Angio Chest Pulmonary Embolism (PE) W or WO Contrast  Result Date: 05/04/2022 CLINICAL DATA:  COVID positive.  Elevated D-dimer. EXAM: CT ANGIOGRAPHY CHEST WITH CONTRAST TECHNIQUE: Multidetector CT imaging of the chest was performed using the standard protocol during bolus administration of intravenous  contrast. Multiplanar CT image reconstructions and MIPs were obtained to evaluate the vascular anatomy. RADIATION DOSE REDUCTION: This exam was performed according to the departmental dose-optimization program which includes automated exposure control, adjustment of the mA and/or kV according to patient size and/or use of iterative reconstruction technique. CONTRAST:  75mL OMNIPAQUE IOHEXOL 350 MG/ML SOLN COMPARISON:  Chest x-ray from yesterday. CT chest dated May 23, 2007. FINDINGS: Cardiovascular: Satisfactory opacification of the pulmonary arteries to the segmental level. No evidence of pulmonary embolism. Cardiomegaly with biatrial enlargement. No pericardial effusion. New ascending thoracic aortic aneurysm measuring up to 5.4 cm in diameter. Coronary, aortic arch, and branch vessel atherosclerotic vascular disease. Mediastinum/Nodes: No enlarged mediastinal, hilar, or axillary lymph nodes. Thyroid gland, trachea, and esophagus demonstrate no significant findings. Lungs/Pleura: Trace bilateral pleural effusions. Mild patchy ground-glass densities in both upper lobes. Scattered mild smooth interlobular septal thickening. Mild bilateral lower lobe subsegmental atelectasis. No pneumothorax. Upper Abdomen: No acute abnormality.  Moderate hiatal hernia. Musculoskeletal: No chest wall abnormality. No acute or significant osseous findings. Review of the MIP images confirms the above findings. IMPRESSION: 1. No evidence  of pulmonary embolism. 2. Findings suggestive of mild congestive heart failure. 3. New 5.4 cm ascending thoracic aortic aneurysm. Ascending thoracic aortic aneurysm. Recommend semi-annual imaging followup by CTA or MRA and referral to cardiothoracic surgery if not already obtained. This recommendation follows 2010 ACCF/AHA/AATS/ACR/ASA/SCA/SCAI/SIR/STS/SVM Guidelines for the Diagnosis and Management of Patients With Thoracic Aortic Disease. Circulation. 2010; 121: F643-P295. Aortic aneurysm NOS  (ICD10-I71.9) 4. Moderate hiatal hernia. 5. Aortic Atherosclerosis (ICD10-I70.0). Electronically Signed   By: Obie Dredge M.D.   On: 05/04/2022 17:29   DG Chest 2 View  Result Date: 05/03/2022 CLINICAL DATA:  Questionable sepsis EXAM: CHEST - 2 VIEW COMPARISON:  Chest x-ray dated March 28, 2020 FINDINGS: Unchanged cardiomegaly and tortuosity of the thoracic aorta. Mild right basilar opacity. No pleural effusion or pneumothorax. Age indeterminate severe compression deformity of the approximate L1 vertebral body. IMPRESSION: 1. Mild right basilar opacity, likely due to atelectasis, infection or aspiration are additional considerations. 2. Age indeterminate severe compression deformity of the approximate L1 vertebral body. Correlate for point tenderness. Electronically Signed   By: Allegra Lai M.D.   On: 05/03/2022 15:00    Microbiology: Results for orders placed or performed during the hospital encounter of 05/03/22  Blood Culture (routine x 2)     Status: None   Collection Time: 05/03/22  1:54 PM   Specimen: BLOOD  Result Value Ref Range Status   Specimen Description BLOOD RIGHT ANTECUBITAL  Final   Special Requests   Final    BOTTLES DRAWN AEROBIC AND ANAEROBIC Blood Culture adequate volume   Culture   Final    NO GROWTH 5 DAYS Performed at [facility], [address] [place], [place] [address]    Report Status 05/08/2022 FINAL  Final  Resp Panel by RT-PCR (Flu A&B, Covid) Anterior Nasal Swab     Status: Abnormal   Collection Time: 05/03/22  4:43 PM   Specimen: Anterior Nasal Swab  Result Value Ref Range Status   SARS Coronavirus 2 by RT PCR POSITIVE (A) NEGATIVE Final    Comment: (NOTE) SARS-CoV-2 target nucleic acids are DETECTED.  The SARS-CoV-2 RNA is generally detectable in upper respiratory specimens during the acute phase of infection. Positive results are indicative of the presence of the identified virus, but do not rule out bacterial infection or  co-infection with other pathogens not detected by the test. Clinical correlation with patient history and other diagnostic information is necessary to determine patient infection status. The expected result is Negative.  Fact Sheet for Patients: BloggerCourse.com  Fact Sheet for Healthcare Providers: SeriousBroker.it  This test is not yet approved or cleared by the [place] FDA and  has been authorized for detection and/or diagnosis of SARS-CoV-2 by FDA under an Emergency Use Authorization (EUA).  This EUA will remain in effect (meaning this test can be used) for the duration of  the COVID-19 declaration under Section 564(b)(1) of the A ct, 21 U.S.C. section 360bbb-3(b)(1), unless the authorization is terminated or revoked sooner.     Influenza A by PCR NEGATIVE NEGATIVE Final   Influenza B by PCR NEGATIVE NEGATIVE Final    Comment: (NOTE) The Xpert Xpress SARS-CoV-2/FLU/RSV plus assay is intended as an aid in the diagnosis of influenza from Nasopharyngeal swab specimens and should not be used as a sole basis for treatment. Nasal washings and aspirates are unacceptable for Xpert Xpress SARS-CoV-2/FLU/RSV testing.  Fact Sheet for Patients: BloggerCourse.com  Fact Sheet for Healthcare Providers: SeriousBroker.it  This test is not yet approved or  cleared by the [place] and has been authorized for detection and/or diagnosis of SARS-CoV-2 by FDA under an Emergency Use Authorization (EUA). This EUA will remain in effect (meaning this test can be used) for the duration of the COVID-19 declaration under Section 564(b)(1) of the Act, 21 U.S.C. section 360bbb-3(b)(1), unless the authorization is terminated or revoked.  Performed at [facility], [address] [place], [place] [address]   Blood Culture (routine x 2)     Status: None   Collection Time:  05/03/22  7:28 PM   Specimen: BLOOD  Result Value Ref Range Status   Specimen Description BLOOD BLOOD RIGHT HAND  Final   Special Requests   Final    BOTTLES DRAWN AEROBIC AND ANAEROBIC Blood Culture results may not be optimal due to an inadequate volume of blood received in culture bottles   Culture   Final    NO GROWTH 5 DAYS Performed at [facility], [address] [place], [place] [address]    Report Status 05/08/2022 FINAL  Final  Urine Culture     Status: Abnormal   Collection Time: 05/05/22 10:00 PM   Specimen: Urine, Random  Result Value Ref Range Status   Specimen Description   Final    URINE, RANDOM Performed at [facility], [address] [place], [place] [address]    Special Requests   Final    NONE Performed at [facility], [address] [place], [place] [address]    Culture >=100,000 COLONIES/mL ESCHERICHIA COLI (A)  Final   Report Status 05/08/2022 FINAL  Final   Organism ID, Bacteria ESCHERICHIA COLI (A)  Final      Susceptibility   Escherichia coli - MIC*    AMPICILLIN <=2 SENSITIVE Sensitive     CEFAZOLIN <=4 SENSITIVE Sensitive     CEFEPIME <=0.12 SENSITIVE Sensitive     CEFTRIAXONE <=0.25 SENSITIVE Sensitive     CIPROFLOXACIN <=0.25 SENSITIVE Sensitive     GENTAMICIN <=1 SENSITIVE Sensitive     IMIPENEM <=0.25 SENSITIVE Sensitive     NITROFURANTOIN <=16 SENSITIVE Sensitive     TRIMETH/SULFA <=20 SENSITIVE Sensitive     AMPICILLIN/SULBACTAM <=2 SENSITIVE Sensitive     PIP/TAZO <=4 SENSITIVE Sensitive     * >=100,000 COLONIES/mL ESCHERICHIA COLI    Labs: CBC: Recent Labs  Lab 05/03/22 1354 05/03/22 1928 05/04/22 0446 05/05/22 0452 05/06/22 0458  WBC 8.0 6.0 5.9 3.6* 11.3*  NEUTROABS 7.1  --  4.2 3.0 10.1*  HGB 14.5 11.8* 12.6 14.3 13.6  HCT 44.7 36.9 39.2 44.1 41.3  MCV 92.2 93.4 91.2 90.6 89.8  PLT 111* 108* 86* 110* 129*   Basic Metabolic Panel: Recent Labs  Lab 05/03/22 1354 05/03/22 1928  05/04/22 0446 05/05/22 0452 05/06/22 0458  NA 137  --  139 140 139  K 4.0  --  3.8 5.1 4.4  CL 105  --  111 108 108  CO2 23  --  24 27 23   GLUCOSE 142*  --  100* 170* 167*  BUN 20  --  18 17 27*  CREATININE 0.82 0.70 0.62 0.65 0.68  CALCIUM 9.5  --  8.8* 9.2 9.2  MG  --   --  2.2 2.3 2.3  PHOS  --   --  3.5 3.6 2.3*   Liver Function Tests: Recent Labs  Lab 05/03/22 1354 05/04/22 0446 05/05/22 0452 05/06/22 0458  AST 25 23 29 29   ALT 14 11 16 15   ALKPHOS  67 51 60 55  BILITOT 1.1 1.1 0.6 0.4  PROT 7.7 6.3* 7.3 6.8  ALBUMIN 4.0 3.3* 3.6 3.4*   CBG: No results for input(s): "GLUCAP" in the last 168 hours.  Discharge time spent: greater than 30 minutes.  Signed: Delfino Lovett, MD [facility] 05/08/2022

## 2022-05-12 DIAGNOSIS — Z09 Encounter for follow-up examination after completed treatment for conditions other than malignant neoplasm: Secondary | ICD-10-CM | POA: Diagnosis not present

## 2022-05-12 DIAGNOSIS — I480 Paroxysmal atrial fibrillation: Secondary | ICD-10-CM | POA: Diagnosis not present

## 2022-05-12 DIAGNOSIS — U071 COVID-19: Secondary | ICD-10-CM | POA: Diagnosis not present

## 2022-06-04 DIAGNOSIS — Z6830 Body mass index (BMI) 30.0-30.9, adult: Secondary | ICD-10-CM | POA: Diagnosis not present

## 2022-06-04 DIAGNOSIS — I10 Essential (primary) hypertension: Secondary | ICD-10-CM | POA: Diagnosis not present

## 2022-06-04 DIAGNOSIS — I480 Paroxysmal atrial fibrillation: Secondary | ICD-10-CM | POA: Diagnosis not present

## 2022-06-04 DIAGNOSIS — I214 Non-ST elevation (NSTEMI) myocardial infarction: Secondary | ICD-10-CM | POA: Diagnosis not present

## 2022-06-04 DIAGNOSIS — I209 Angina pectoris, unspecified: Secondary | ICD-10-CM | POA: Diagnosis not present

## 2022-06-04 DIAGNOSIS — R011 Cardiac murmur, unspecified: Secondary | ICD-10-CM | POA: Diagnosis not present

## 2022-06-04 DIAGNOSIS — R000 Tachycardia, unspecified: Secondary | ICD-10-CM | POA: Diagnosis not present

## 2022-06-04 DIAGNOSIS — Z955 Presence of coronary angioplasty implant and graft: Secondary | ICD-10-CM | POA: Diagnosis not present

## 2022-06-04 DIAGNOSIS — E6609 Other obesity due to excess calories: Secondary | ICD-10-CM | POA: Diagnosis not present

## 2022-06-04 DIAGNOSIS — R0602 Shortness of breath: Secondary | ICD-10-CM | POA: Diagnosis not present

## 2022-06-04 DIAGNOSIS — I2510 Atherosclerotic heart disease of native coronary artery without angina pectoris: Secondary | ICD-10-CM | POA: Diagnosis not present

## 2022-06-07 DIAGNOSIS — R55 Syncope and collapse: Secondary | ICD-10-CM | POA: Diagnosis not present

## 2022-06-07 DIAGNOSIS — I469 Cardiac arrest, cause unspecified: Secondary | ICD-10-CM | POA: Diagnosis not present

## 2022-06-07 DIAGNOSIS — R0689 Other abnormalities of breathing: Secondary | ICD-10-CM | POA: Diagnosis not present

## 2022-06-07 DIAGNOSIS — R404 Transient alteration of awareness: Secondary | ICD-10-CM | POA: Diagnosis not present

## 2022-06-20 DIAGNOSIS — 419620001 Death: Secondary | SNOMED CT | POA: Diagnosis not present

## 2022-06-20 DEATH — deceased

## 2022-07-19 ENCOUNTER — Encounter (INDEPENDENT_AMBULATORY_CARE_PROVIDER_SITE_OTHER): Payer: Self-pay

## 2022-11-29 ENCOUNTER — Encounter (INDEPENDENT_AMBULATORY_CARE_PROVIDER_SITE_OTHER): Payer: No Typology Code available for payment source

## 2022-11-29 ENCOUNTER — Ambulatory Visit (INDEPENDENT_AMBULATORY_CARE_PROVIDER_SITE_OTHER): Payer: No Typology Code available for payment source | Admitting: Nurse Practitioner
